# Patient Record
Sex: Male | Born: 1950 | Race: White | Hispanic: No | Marital: Married | Smoking: Never smoker
Health system: Southern US, Community
[De-identification: ages and names within clinical notes are randomized; demographics above are authoritative.]

## PROBLEM LIST (undated history)

## (undated) DIAGNOSIS — G8250 Quadriplegia, unspecified: Secondary | ICD-10-CM

## (undated) DIAGNOSIS — T7840XA Allergy, unspecified, initial encounter: Secondary | ICD-10-CM

## (undated) DIAGNOSIS — E119 Type 2 diabetes mellitus without complications: Secondary | ICD-10-CM

## (undated) DIAGNOSIS — G909 Disorder of the autonomic nervous system, unspecified: Secondary | ICD-10-CM

## (undated) DIAGNOSIS — I639 Cerebral infarction, unspecified: Secondary | ICD-10-CM

## (undated) DIAGNOSIS — R569 Unspecified convulsions: Secondary | ICD-10-CM

## (undated) DIAGNOSIS — G4700 Insomnia, unspecified: Secondary | ICD-10-CM

## (undated) DIAGNOSIS — G8190 Hemiplegia, unspecified affecting unspecified side: Secondary | ICD-10-CM

## (undated) DIAGNOSIS — G959 Disease of spinal cord, unspecified: Secondary | ICD-10-CM

## (undated) DIAGNOSIS — K219 Gastro-esophageal reflux disease without esophagitis: Secondary | ICD-10-CM

## (undated) DIAGNOSIS — I2510 Atherosclerotic heart disease of native coronary artery without angina pectoris: Secondary | ICD-10-CM

## (undated) DIAGNOSIS — E785 Hyperlipidemia, unspecified: Secondary | ICD-10-CM

## (undated) DIAGNOSIS — I6529 Occlusion and stenosis of unspecified carotid artery: Secondary | ICD-10-CM

## (undated) DIAGNOSIS — F419 Anxiety disorder, unspecified: Secondary | ICD-10-CM

## (undated) HISTORY — DX: Hyperlipidemia, unspecified: E78.5

## (undated) HISTORY — DX: Occlusion and stenosis of unspecified carotid artery: I65.29

## (undated) HISTORY — DX: Unspecified convulsions: R56.9

## (undated) HISTORY — DX: Anxiety disorder, unspecified: F41.9

## (undated) HISTORY — DX: Disease of spinal cord, unspecified: G95.9

## (undated) HISTORY — PX: TRACHEOSTOMY: SUR1362

## (undated) HISTORY — DX: Cerebral infarction, unspecified: I63.9

## (undated) HISTORY — DX: Type 2 diabetes mellitus without complications: E11.9

## (undated) HISTORY — DX: Hemiplegia, unspecified affecting unspecified side: G81.90

## (undated) HISTORY — DX: Gastro-esophageal reflux disease without esophagitis: K21.9

## (undated) HISTORY — PX: ENDARTERECTOMY: SHX5162

## (undated) HISTORY — DX: Insomnia, unspecified: G47.00

## (undated) HISTORY — DX: Allergy, unspecified, initial encounter: T78.40XA

## (undated) HISTORY — DX: Atherosclerotic heart disease of native coronary artery without angina pectoris: I25.10

## (undated) HISTORY — DX: Disorder of the autonomic nervous system, unspecified: G90.9

---

## 2005-02-09 ENCOUNTER — Ambulatory Visit: Payer: Self-pay | Admitting: Gastroenterology

## 2006-08-01 ENCOUNTER — Ambulatory Visit: Payer: Self-pay | Admitting: Family Medicine

## 2006-08-21 ENCOUNTER — Ambulatory Visit: Payer: Self-pay | Admitting: Vascular Surgery

## 2006-09-19 ENCOUNTER — Other Ambulatory Visit: Payer: Self-pay

## 2006-09-19 ENCOUNTER — Ambulatory Visit: Payer: Self-pay | Admitting: Vascular Surgery

## 2006-09-26 ENCOUNTER — Inpatient Hospital Stay: Payer: Self-pay | Admitting: Vascular Surgery

## 2006-10-04 ENCOUNTER — Encounter: Payer: Self-pay | Admitting: Vascular Surgery

## 2006-10-28 ENCOUNTER — Encounter: Payer: Self-pay | Admitting: Vascular Surgery

## 2006-11-18 ENCOUNTER — Other Ambulatory Visit: Payer: Self-pay

## 2006-11-18 ENCOUNTER — Emergency Department: Payer: Self-pay | Admitting: Emergency Medicine

## 2006-11-27 ENCOUNTER — Encounter: Payer: Self-pay | Admitting: Vascular Surgery

## 2006-12-28 ENCOUNTER — Encounter: Payer: Self-pay | Admitting: Vascular Surgery

## 2007-01-01 ENCOUNTER — Ambulatory Visit: Payer: Self-pay | Admitting: Gastroenterology

## 2007-01-27 ENCOUNTER — Encounter: Payer: Self-pay | Admitting: Vascular Surgery

## 2010-03-15 ENCOUNTER — Encounter: Payer: Self-pay | Admitting: Physical Medicine and Rehabilitation

## 2010-03-29 ENCOUNTER — Encounter: Payer: Self-pay | Admitting: Physical Medicine and Rehabilitation

## 2010-04-27 ENCOUNTER — Encounter: Payer: Self-pay | Admitting: Physical Medicine and Rehabilitation

## 2010-05-04 ENCOUNTER — Other Ambulatory Visit: Payer: Self-pay

## 2010-05-28 ENCOUNTER — Encounter: Payer: Self-pay | Admitting: Physical Medicine and Rehabilitation

## 2010-06-27 ENCOUNTER — Encounter: Payer: Self-pay | Admitting: Physical Medicine and Rehabilitation

## 2010-07-28 ENCOUNTER — Encounter: Payer: Self-pay | Admitting: Physical Medicine and Rehabilitation

## 2010-08-27 ENCOUNTER — Encounter: Payer: Self-pay | Admitting: Physical Medicine and Rehabilitation

## 2010-09-27 ENCOUNTER — Encounter: Payer: Self-pay | Admitting: Physical Medicine and Rehabilitation

## 2010-10-28 ENCOUNTER — Encounter: Payer: Self-pay | Admitting: Physical Medicine and Rehabilitation

## 2010-11-27 ENCOUNTER — Encounter: Payer: Self-pay | Admitting: Physical Medicine and Rehabilitation

## 2012-05-27 DIAGNOSIS — I6350 Cerebral infarction due to unspecified occlusion or stenosis of unspecified cerebral artery: Secondary | ICD-10-CM | POA: Diagnosis not present

## 2012-05-27 DIAGNOSIS — I6529 Occlusion and stenosis of unspecified carotid artery: Secondary | ICD-10-CM | POA: Diagnosis not present

## 2012-07-30 DIAGNOSIS — L219 Seborrheic dermatitis, unspecified: Secondary | ICD-10-CM | POA: Diagnosis not present

## 2012-07-30 DIAGNOSIS — L570 Actinic keratosis: Secondary | ICD-10-CM | POA: Diagnosis not present

## 2012-08-06 ENCOUNTER — Ambulatory Visit: Payer: Self-pay | Admitting: Family Medicine

## 2012-08-06 DIAGNOSIS — R42 Dizziness and giddiness: Secondary | ICD-10-CM | POA: Diagnosis not present

## 2012-08-06 DIAGNOSIS — M7989 Other specified soft tissue disorders: Secondary | ICD-10-CM | POA: Diagnosis not present

## 2012-08-06 DIAGNOSIS — R609 Edema, unspecified: Secondary | ICD-10-CM | POA: Diagnosis not present

## 2012-08-11 ENCOUNTER — Observation Stay: Payer: Self-pay | Admitting: Internal Medicine

## 2012-08-11 DIAGNOSIS — E785 Hyperlipidemia, unspecified: Secondary | ICD-10-CM | POA: Diagnosis not present

## 2012-08-11 DIAGNOSIS — Z7401 Bed confinement status: Secondary | ICD-10-CM | POA: Diagnosis not present

## 2012-08-11 DIAGNOSIS — G9340 Encephalopathy, unspecified: Secondary | ICD-10-CM | POA: Diagnosis not present

## 2012-08-11 DIAGNOSIS — Z7902 Long term (current) use of antithrombotics/antiplatelets: Secondary | ICD-10-CM | POA: Diagnosis not present

## 2012-08-11 DIAGNOSIS — Z79899 Other long term (current) drug therapy: Secondary | ICD-10-CM | POA: Diagnosis not present

## 2012-08-11 DIAGNOSIS — G8250 Quadriplegia, unspecified: Secondary | ICD-10-CM | POA: Diagnosis not present

## 2012-08-11 DIAGNOSIS — N390 Urinary tract infection, site not specified: Secondary | ICD-10-CM | POA: Diagnosis not present

## 2012-08-11 DIAGNOSIS — Z981 Arthrodesis status: Secondary | ICD-10-CM | POA: Diagnosis not present

## 2012-08-11 DIAGNOSIS — K219 Gastro-esophageal reflux disease without esophagitis: Secondary | ICD-10-CM | POA: Diagnosis not present

## 2012-08-11 DIAGNOSIS — M129 Arthropathy, unspecified: Secondary | ICD-10-CM | POA: Diagnosis not present

## 2012-08-11 DIAGNOSIS — T83511A Infection and inflammatory reaction due to indwelling urethral catheter, initial encounter: Secondary | ICD-10-CM | POA: Diagnosis not present

## 2012-08-11 DIAGNOSIS — R5381 Other malaise: Secondary | ICD-10-CM | POA: Diagnosis not present

## 2012-08-11 DIAGNOSIS — R4182 Altered mental status, unspecified: Secondary | ICD-10-CM | POA: Diagnosis not present

## 2012-08-11 DIAGNOSIS — IMO0002 Reserved for concepts with insufficient information to code with codable children: Secondary | ICD-10-CM | POA: Diagnosis not present

## 2012-08-11 DIAGNOSIS — G459 Transient cerebral ischemic attack, unspecified: Secondary | ICD-10-CM | POA: Diagnosis not present

## 2012-08-11 DIAGNOSIS — R5383 Other fatigue: Secondary | ICD-10-CM | POA: Diagnosis not present

## 2012-08-11 DIAGNOSIS — R404 Transient alteration of awareness: Secondary | ICD-10-CM | POA: Diagnosis not present

## 2012-08-11 DIAGNOSIS — R4701 Aphasia: Secondary | ICD-10-CM | POA: Diagnosis not present

## 2012-08-11 DIAGNOSIS — I10 Essential (primary) hypertension: Secondary | ICD-10-CM | POA: Diagnosis not present

## 2012-08-11 DIAGNOSIS — A498 Other bacterial infections of unspecified site: Secondary | ICD-10-CM | POA: Diagnosis not present

## 2012-08-11 LAB — CBC
HCT: 41.9 % (ref 40.0–52.0)
MCH: 30 pg (ref 26.0–34.0)
MCV: 90 fL (ref 80–100)
RBC: 4.67 10*6/uL (ref 4.40–5.90)
WBC: 11 10*3/uL — ABNORMAL HIGH (ref 3.8–10.6)

## 2012-08-11 LAB — COMPREHENSIVE METABOLIC PANEL
Albumin: 3.5 g/dL (ref 3.4–5.0)
Alkaline Phosphatase: 140 U/L — ABNORMAL HIGH (ref 50–136)
BUN: 13 mg/dL (ref 7–18)
Bilirubin,Total: 0.6 mg/dL (ref 0.2–1.0)
Calcium, Total: 9.3 mg/dL (ref 8.5–10.1)
Chloride: 107 mmol/L (ref 98–107)
EGFR (African American): 58 — ABNORMAL LOW
EGFR (Non-African Amer.): 50 — ABNORMAL LOW
Glucose: 83 mg/dL (ref 65–99)
Total Protein: 7.4 g/dL (ref 6.4–8.2)

## 2012-08-11 LAB — URINALYSIS, COMPLETE
Bilirubin,UR: NEGATIVE
Glucose,UR: NEGATIVE mg/dL (ref 0–75)
Hyaline Cast: 9
Ketone: NEGATIVE
RBC,UR: 3 /HPF (ref 0–5)
Specific Gravity: 1.009 (ref 1.003–1.030)
Squamous Epithelial: <1
WBC UR: 175 /HPF (ref 0–5)

## 2012-08-11 LAB — DRUG SCREEN, URINE
Amphetamines, Ur Screen: NEGATIVE (ref ?–1000)
MDMA (Ecstasy)Ur Screen: POSITIVE (ref ?–500)
Opiate, Ur Screen: NEGATIVE (ref ?–300)
Phencyclidine (PCP) Ur S: NEGATIVE (ref ?–25)
Tricyclic, Ur Screen: NEGATIVE (ref ?–1000)

## 2012-08-11 LAB — PROTIME-INR: INR: 1

## 2012-08-11 LAB — TROPONIN I: Troponin-I: <0.02 ng/mL

## 2012-08-13 LAB — BASIC METABOLIC PANEL
BUN: 11 mg/dL (ref 7–18)
Calcium, Total: 8.3 mg/dL — ABNORMAL LOW (ref 8.5–10.1)
Co2: 27 mmol/L (ref 21–32)
EGFR (African American): >60
Glucose: 103 mg/dL — ABNORMAL HIGH (ref 65–99)
Osmolality: 286 (ref 275–301)
Potassium: 3.6 mmol/L (ref 3.5–5.1)
Sodium: 144 mmol/L (ref 136–145)

## 2012-08-13 LAB — CLOSTRIDIUM DIFFICILE BY PCR

## 2012-08-22 DIAGNOSIS — A419 Sepsis, unspecified organism: Secondary | ICD-10-CM | POA: Diagnosis not present

## 2012-10-01 DIAGNOSIS — R9431 Abnormal electrocardiogram [ECG] [EKG]: Secondary | ICD-10-CM | POA: Diagnosis not present

## 2012-10-01 DIAGNOSIS — R569 Unspecified convulsions: Secondary | ICD-10-CM

## 2012-10-01 DIAGNOSIS — Z79899 Other long term (current) drug therapy: Secondary | ICD-10-CM | POA: Diagnosis not present

## 2012-10-01 DIAGNOSIS — R55 Syncope and collapse: Secondary | ICD-10-CM | POA: Diagnosis not present

## 2012-10-01 DIAGNOSIS — R259 Unspecified abnormal involuntary movements: Secondary | ICD-10-CM | POA: Diagnosis not present

## 2012-10-01 DIAGNOSIS — G8190 Hemiplegia, unspecified affecting unspecified side: Secondary | ICD-10-CM | POA: Insufficient documentation

## 2012-10-01 DIAGNOSIS — G8250 Quadriplegia, unspecified: Secondary | ICD-10-CM | POA: Diagnosis not present

## 2012-10-01 DIAGNOSIS — G909 Disorder of the autonomic nervous system, unspecified: Secondary | ICD-10-CM | POA: Insufficient documentation

## 2012-10-01 DIAGNOSIS — E785 Hyperlipidemia, unspecified: Secondary | ICD-10-CM

## 2012-10-01 DIAGNOSIS — A498 Other bacterial infections of unspecified site: Secondary | ICD-10-CM | POA: Diagnosis not present

## 2012-10-01 DIAGNOSIS — IMO0002 Reserved for concepts with insufficient information to code with codable children: Secondary | ICD-10-CM | POA: Diagnosis not present

## 2012-10-01 DIAGNOSIS — F3289 Other specified depressive episodes: Secondary | ICD-10-CM | POA: Diagnosis not present

## 2012-10-01 DIAGNOSIS — I6529 Occlusion and stenosis of unspecified carotid artery: Secondary | ICD-10-CM | POA: Diagnosis not present

## 2012-10-01 DIAGNOSIS — F329 Major depressive disorder, single episode, unspecified: Secondary | ICD-10-CM | POA: Diagnosis not present

## 2012-10-01 DIAGNOSIS — Z8673 Personal history of transient ischemic attack (TIA), and cerebral infarction without residual deficits: Secondary | ICD-10-CM | POA: Diagnosis not present

## 2012-10-01 DIAGNOSIS — H532 Diplopia: Secondary | ICD-10-CM | POA: Diagnosis not present

## 2012-10-01 DIAGNOSIS — N390 Urinary tract infection, site not specified: Secondary | ICD-10-CM | POA: Diagnosis not present

## 2012-10-01 DIAGNOSIS — Z993 Dependence on wheelchair: Secondary | ICD-10-CM | POA: Diagnosis not present

## 2012-10-01 DIAGNOSIS — F411 Generalized anxiety disorder: Secondary | ICD-10-CM | POA: Diagnosis not present

## 2012-10-01 HISTORY — DX: Hemiplegia, unspecified affecting unspecified side: G81.90

## 2012-10-01 HISTORY — DX: Disorder of the autonomic nervous system, unspecified: G90.9

## 2012-10-01 HISTORY — DX: Hyperlipidemia, unspecified: E78.5

## 2012-10-01 HISTORY — DX: Unspecified convulsions: R56.9

## 2012-10-02 DIAGNOSIS — N390 Urinary tract infection, site not specified: Secondary | ICD-10-CM | POA: Diagnosis not present

## 2012-10-02 DIAGNOSIS — R55 Syncope and collapse: Secondary | ICD-10-CM | POA: Diagnosis not present

## 2012-10-02 DIAGNOSIS — G8250 Quadriplegia, unspecified: Secondary | ICD-10-CM | POA: Diagnosis not present

## 2012-10-02 DIAGNOSIS — Z8673 Personal history of transient ischemic attack (TIA), and cerebral infarction without residual deficits: Secondary | ICD-10-CM | POA: Diagnosis not present

## 2012-10-02 DIAGNOSIS — R569 Unspecified convulsions: Secondary | ICD-10-CM | POA: Diagnosis not present

## 2012-10-02 DIAGNOSIS — G909 Disorder of the autonomic nervous system, unspecified: Secondary | ICD-10-CM | POA: Diagnosis not present

## 2013-01-13 DIAGNOSIS — K592 Neurogenic bowel, not elsewhere classified: Secondary | ICD-10-CM | POA: Diagnosis not present

## 2013-01-13 DIAGNOSIS — M62838 Other muscle spasm: Secondary | ICD-10-CM | POA: Diagnosis not present

## 2013-04-16 DIAGNOSIS — K219 Gastro-esophageal reflux disease without esophagitis: Secondary | ICD-10-CM | POA: Diagnosis not present

## 2013-04-16 DIAGNOSIS — G959 Disease of spinal cord, unspecified: Secondary | ICD-10-CM | POA: Diagnosis not present

## 2013-04-16 DIAGNOSIS — E785 Hyperlipidemia, unspecified: Secondary | ICD-10-CM | POA: Diagnosis not present

## 2013-04-16 DIAGNOSIS — F3289 Other specified depressive episodes: Secondary | ICD-10-CM | POA: Diagnosis not present

## 2013-04-16 DIAGNOSIS — F329 Major depressive disorder, single episode, unspecified: Secondary | ICD-10-CM | POA: Diagnosis not present

## 2013-09-10 DIAGNOSIS — N319 Neuromuscular dysfunction of bladder, unspecified: Secondary | ICD-10-CM | POA: Diagnosis not present

## 2013-09-10 DIAGNOSIS — Z0000 Encounter for general adult medical examination without abnormal findings: Secondary | ICD-10-CM | POA: Diagnosis not present

## 2013-09-10 DIAGNOSIS — K219 Gastro-esophageal reflux disease without esophagitis: Secondary | ICD-10-CM | POA: Diagnosis not present

## 2013-09-10 DIAGNOSIS — E785 Hyperlipidemia, unspecified: Secondary | ICD-10-CM | POA: Diagnosis not present

## 2013-09-15 DIAGNOSIS — I6350 Cerebral infarction due to unspecified occlusion or stenosis of unspecified cerebral artery: Secondary | ICD-10-CM | POA: Diagnosis not present

## 2013-09-15 DIAGNOSIS — I2510 Atherosclerotic heart disease of native coronary artery without angina pectoris: Secondary | ICD-10-CM | POA: Diagnosis not present

## 2013-09-15 DIAGNOSIS — I6529 Occlusion and stenosis of unspecified carotid artery: Secondary | ICD-10-CM | POA: Diagnosis not present

## 2014-05-24 DIAGNOSIS — J218 Acute bronchiolitis due to other specified organisms: Secondary | ICD-10-CM | POA: Diagnosis not present

## 2014-06-18 NOTE — H&P (Signed)
PATIENT NAME:  [name], [name] MR#:  [id number] DATE OF BIRTH:  February 26, 1951  DATE OF ADMISSION:  08/11/2012  PRIMARY CARE PHYSICIAN: Dr. Elizabeth Sauer  PRIMARY UROLOGIST:  Dr. Nino Parsley at [facility].   CHIEF COMPLAINT: Altered mental status. The patient is not himself.   History is obtained from patient, the patient's daughter and the patient's wife who are present in the Emergency Room. The patient is a 64 year old Caucasian gentleman with past medical history of spinal cord injury after a fall about three years ago that required extended stay at [facility] with spinal cord injury and rehabilitation along with tracheostomy and suprapubic catheter that was placed during that hospitalization. The patient has been taken care of at home. He is bedbound, needs assistance with all his activities of daily living including feeding. His wife, his daughter, help him out most of the time; however, he does have some help from outside home health agency.   The patient has been brought in today because he was not his usual self and per wife he was fixated on looking at something on the ceiling, which is not usual for him. He was talking less today and did not eat and has not drank any fluids this morning. There was no reported seizures or loss of consciousness noted. There is no reported fever either at home.   In the Emergency Room, the patient is hemodynamically stable. There is no fever documented. 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 The patient's urine came back positive for urinary tract infection. He is being admitted for further evaluation and management. He does have a mild elevated white count of 11,000. The patient received p.o. Cipro.    PAST MEDICAL HISTORY: 1. Spinal cord injury with spinal cord syndrome about three years ago requiring extended stay at [facility] with hospitalization that required tracheostomy and suprapubic catheter. The patient  underwent inpatient rehab, and then was sent home thereafter. He is being taken at home.  2. Chronic back pain.  3. Hypertension.  4. History of carpal tunnel release in the back. 5. Cervical fusion surgery in the past.   ALLERGIES: No known drug allergies.   SOCIAL HISTORY: Nonsmoker. Nonalcoholic lives at home with his wife.   MEDICATIONS: 1. Atorvastatin 10 mg daily.  2. Baclofen 20 mg 2 tablets twice a day.  3. Bupropion 75 mg b.i.d.  4. Plavix 75 mg daily.  5. Diazepam 5 mg 2 tablets at bedtime.  6. Oxybutynin 5 mg 2 tablets twice a day.  7. Protonix 40 mg p.o. daily.  8. Zyrtec 10 mg daily.   FAMILY HISTORY:  The patient, due to his mental status changes, cannot recall any family history at this time.   REVIEW OF SYSTEMS:  Limited due to mental status changes.   CONSTITUTIONAL: No fever. Positive for fatigue, weakness.   EYES: No blurred or double vision. No glaucoma or cataracts.   ENT: No tinnitus, ear pain, hearing loss.   RESPIRATORY: No cough, wheeze, hemoptysis, or shortness of breath.   CARDIOVASCULAR: No chest pain, edema or hypertension.   GASTROINTESTINAL: No nausea, vomiting, diarrhea, abdominal pain or GERD.  GENITOURINARY: No dysuria or hematuria. The patient has a suprapubic catheter.   ENDOCRINE: No polyuria, nocturia or thyroid problems.   HEMATOLOGY: No easy bruising.   SKIN: No acne or rash.   MUSCULOSKELETAL: Positive for arthritis and some muscle stiffness in both upper and lower extremity. The patient  does have some spasms.  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   PSYCHIATRIC: No anxiety or depression. All other systems reviewed and negative.   PHYSICAL EXAMINATION: GENERAL: The patient is awake. He answers a couple basic questions appropriately. He is afebrile, pulse 71, respirations 18, blood pressure is 153/70, sats are 97% on room air.   HEENT: Atraumatic, normocephalic. PERRLA. EOM intact. Oral mucosa is  mildly dry.   NECK: Supple. Oral tracheal tube is corroded.  RESPIRATORY: Clear to auscultation bilaterally. No rales, rhonchi, respiratory distress or labored breathing.   HEART: Both the heart sounds are normal. Rate, rhythm regular. PMI not lateralized. Chest is nontender.   EXTREMITIES: Good pedal pulses.   ABDOMEN: Soft. There is no mass felt. Suprapubic catheter present, site looks okay.   NEUROLOGIC: The patient has "quadriparesis" bilaterally, which is functional secondary to spinal cord injury. His reflexes are 1+ with a flicker.   The patient does have some spasticity in both lower extremities. Limited secondary to the patient's mental status changes.   SKIN: Warm and dry.   Urinalysis positive for urinary tract infection. Urine drug screen positive for MDMA and benzodiazepines.  CT of the head shows changes of sinusitis with old stroke left parietal lobe. Ammonia level is 29.  Cardiac enzymes are negative.  His comprehensive metabolic panel within normal limits except creatinine 1.46. CBC within normal limits except white count of 11. PT-INR is 13.7 in 1. Troponin is less than 0.02. Urinalysis positive for urinary tract infection. Positive for leukocyte esterase.   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 Altered mental status/generalized weakness suspected due to urinary tract infection. We will admit the patient to the medical floor. Regular diet. Continue IV fluids another 1000 mL for another 1 liter bag. We will give IV Cipro b.i.d., neuro checks q.4 hourly. The patient has a suprapubic catheter which is changed by family members every week. We will await urine cultures.  2. Spinal cord syndrome from injury status post fall about 5 years ago.  Bedbound, needs assistance with feeding and all his activities of daily living.  Will continue his routine baclofen for muscle  spasms. 3. Gastroesophageal reflux disease on Protonix.  4. Hyperlipidemia. On statins.  5. Further workup according to the patient's clinical course. Hospital admission plan was discussed with the patient's daughter and the patient's wife. The patient is a full code. I did leave a message [facility]rologist [facility] at [phone].   TIME SPENT: 50 minutes.    ____________________________ Wylie HailSona A. Allena KatzPatel, MD sap:rw D: 08/11/2012 17:01:15 ET T: 08/11/2012 17:32:43 ET JOB#: [id number]  cc: Alwaleed Obeso A. Allena KatzPatel, MD, <Dictator> Willow OraSONA A Kaylean Tupou MD ELECTRONICALLY SIGNED 08/20/2012 14:23

## 2014-06-18 NOTE — Discharge Summary (Signed)
PATIENT NAME:  [name], [name] MR#:  [id number] DATE OF BIRTH:  November 19, 1950  DATE OF ADMISSION:  08/11/2012 DATE OF DISCHARGE:  08/13/2012  ADMISSION DIAGNOSIS: Encephalopathy with altered mental status and urinary tract infection.   DISCHARGE DIAGNOSIS:  1. Encephalopathy with altered mental status and urinary tract infection.  2. Escherichia coli urinary tract infection.  3. C3-C4 paralysis   CONSULTS:  None.   Urine culture grew out greater than 100,000 gram-negative rods. I did call the lab and they that this was Escherichia coli. He also has 80,000 gram-negative rods and 40,000 gram-negative rods. C. difficile was negative.   HOSPITAL COURSE: [name] with C3-C4, paralysis and has a suprapubic catheter who presented with altered mental status. For further details, please refer to the H and P.   1. Encephalopathy with altered mental status from urinary tract infection caused by the suprapubic catheterization. The patient is growing out 3 gram-negative rods, 1 with greater than 100,000 colonies of Escherichia coli. The patient actually started on ciprofloxacin. He did quite well with this medication. His mental status is back to baseline almost immediately after giving antibiotics. He was afebrile. His white count was normal as well, so he will be discharged with ciprofloxacin.  2. Urinary tract infection related to his suprapubic catheter.  Escherichia coli with 2 other ones that were not greater than 100,000 colonies.  3. History C3-C4 paralysis. He will have home health care.   DISCHARGE MEDICATIONS: 1. Atorvastatin 10 mg at bedtime.  2. Plavix 75 mg daily.  3. Oxybutynin 5 mg 2 tablets b.i.d.   4. Bupropion 75 mg b.i.d.  5. Pantoprazole 40 mg daily.  6. Baclofen 20 mg 2 tablets b.i.d.  7. Diazepam 5 mg 2 tablets at bedtime.  8. Zyrtec 10 mg daily.  9. Ciprofloxacin 500 mg q. 12 hours for 8 days.   Discharge with home health, physical therapy, OT, and nurse.    DISCHARGE DIET: Low sodium.   DISCHARGE ACTIVITY: As tolerated.   DISCHARGE FOLLOW UP:  The patient will follow up with Dr. Elizabeth Sauereanna Jones in 1 week.   TIME SPENT:  35 minutes.  The patient is medically stable for discharge.   ____________________________ [name] Beagle P. Juliene PinaMody, [name] spm:rw D: 08/13/2012 14:33:07 ET T: 08/13/2012 20:17:08 ET JOB#: [id number]  cc: Camron Monday P. Juliene PinaMody, [name], <Dictator> Duanne Limerickeanna C. Jones, [name]  Janyth ContesSITAL P Tarica Harl [name] ELECTRONICALLY SIGNED 08/14/2012 13:09

## 2014-07-07 DIAGNOSIS — N319 Neuromuscular dysfunction of bladder, unspecified: Secondary | ICD-10-CM | POA: Diagnosis not present

## 2014-07-29 DIAGNOSIS — N319 Neuromuscular dysfunction of bladder, unspecified: Secondary | ICD-10-CM | POA: Diagnosis not present

## 2014-08-31 ENCOUNTER — Encounter: Payer: Self-pay | Admitting: Family Medicine

## 2014-08-31 ENCOUNTER — Ambulatory Visit (INDEPENDENT_AMBULATORY_CARE_PROVIDER_SITE_OTHER): Payer: 59 | Admitting: Family Medicine

## 2014-08-31 VITALS — BP 120/80 | HR 72 | Ht 72.0 in | Wt 185.0 lb

## 2014-08-31 DIAGNOSIS — Z0000 Encounter for general adult medical examination without abnormal findings: Secondary | ICD-10-CM

## 2014-08-31 DIAGNOSIS — N319 Neuromuscular dysfunction of bladder, unspecified: Secondary | ICD-10-CM

## 2014-08-31 DIAGNOSIS — K219 Gastro-esophageal reflux disease without esophagitis: Secondary | ICD-10-CM

## 2014-08-31 DIAGNOSIS — Z131 Encounter for screening for diabetes mellitus: Secondary | ICD-10-CM

## 2014-08-31 DIAGNOSIS — F32A Depression, unspecified: Secondary | ICD-10-CM

## 2014-08-31 DIAGNOSIS — F329 Major depressive disorder, single episode, unspecified: Secondary | ICD-10-CM

## 2014-08-31 DIAGNOSIS — E785 Hyperlipidemia, unspecified: Secondary | ICD-10-CM

## 2014-08-31 DIAGNOSIS — I2510 Atherosclerotic heart disease of native coronary artery without angina pectoris: Secondary | ICD-10-CM | POA: Diagnosis not present

## 2014-08-31 DIAGNOSIS — I6340 Cerebral infarction due to embolism of unspecified cerebral artery: Secondary | ICD-10-CM

## 2014-08-31 MED ORDER — OXYBUTYNIN CHLORIDE ER 5 MG PO TB24: 5.0000 mg | ORAL_TABLET | Freq: Three times a day (TID) | ORAL | Status: DC

## 2014-08-31 MED ORDER — ATORVASTATIN CALCIUM 10 MG PO TABS: 10.0000 mg | ORAL_TABLET | Freq: Every day | ORAL | Status: DC

## 2014-08-31 MED ORDER — PANTOPRAZOLE SODIUM 40 MG PO TBEC: 40.0000 mg | DELAYED_RELEASE_TABLET | Freq: Every day | ORAL | Status: DC

## 2014-08-31 MED ORDER — CLOPIDOGREL BISULFATE 75 MG PO TABS: 75.0000 mg | ORAL_TABLET | Freq: Every day | ORAL | Status: DC

## 2014-08-31 MED ORDER — BUPROPION HCL 75 MG PO TABS: 75.0000 mg | ORAL_TABLET | Freq: Two times a day (BID) | ORAL | Status: DC

## 2014-08-31 NOTE — Progress Notes (Signed)
Name: [name] ShaggyRichard C [name]   MRN: [id number]    DOB: 31-Dec-1950   Date:08/31/2014       Progress Note  Subjective  Chief Complaint  Chief Complaint  Patient presents with  . Annual Exam  . Hyperlipidemia  . Hypertension  . Gastrophageal Reflux    Hyperlipidemia This is a recurrent problem. The current episode started more than 1 year ago. Recent lipid tests were reviewed and are normal. He has no history of chronic renal disease, diabetes, hypothyroidism, liver disease, obesity or nephrotic syndrome. There are no known factors aggravating his hyperlipidemia. Associated symptoms include a focal weakness. Pertinent negatives include no chest pain, focal sensory loss, leg pain, myalgias or shortness of breath. Current antihyperlipidemic treatment includes statins. The current treatment provides moderate improvement of lipids. There are no compliance problems.  Risk factors for coronary artery disease include a sedentary lifestyle.  Gastrophageal Reflux He reports no abdominal pain, no belching, no chest pain, no choking, no coughing, no dysphagia, no heartburn, no nausea, no sore throat or no wheezing. This is a chronic problem. The current episode started more than 1 year ago. The problem occurs occasionally. The problem has been waxing and waning. The symptoms are aggravated by lying down. Pertinent negatives include no anemia, fatigue, melena, muscle weakness, orthopnea or weight loss. He has tried a PPI for the symptoms. The treatment provided mild relief.  Mental Health Problem The primary symptoms include dysphoric mood. The primary symptoms do not include delusions, hallucinations, bizarre behavior, disorganized speech, negative symptoms or somatic symptoms. The current episode started more than 1 month ago.  The onset of the illness is precipitated by a stressful event. Additional symptoms of the illness do not include no anhedonia, no insomnia, no hypersomnia, no fatigue, no headaches or no  abdominal pain. He does not admit to suicidal ideas. He does not have a plan to commit suicide. Risk factors that are present for mental illness include a history of mental illness and neurological disease.  Urinary Frequency  This is a chronic problem. The current episode started more than 1 year ago. The problem occurs every urination. The problem has been waxing and waning. Associated symptoms include frequency. Pertinent negatives include no chills, hematuria, nausea or urgency. The treatment provided mild relief. His past medical history is significant for catheterization and recurrent UTIs.  Neurologic Problem The patient's primary symptoms include focal weakness and weakness. The patient's pertinent negatives include no altered mental status or focal sensory loss. This is a chronic problem. The current episode started more than 1 year ago. The neurological problem developed suddenly. The problem is unchanged. There was right-sided and left-sided focality noted. Associated symptoms include bladder incontinence and bowel incontinence. Pertinent negatives include no abdominal pain, back pain, chest pain, dizziness, fatigue, fever, headaches, nausea, neck pain, palpitations or shortness of breath. Past treatments include neck support, medication and position change. The treatment provided mild relief. His past medical history is significant for a CVA. There is no history of a bleeding disorder or liver disease.    No problem-specific assessment & plan notes found for this encounter.   Past Medical History  Diagnosis Date  . GERD (gastroesophageal reflux disease)   . Anxiety   . CAD (coronary artery disease)   . Hyperlipidemia   . Allergy   . Stroke   . Spinal cord disease     Past Surgical History  Procedure Laterality Date  . Endarterectomy    . Endarterectomy    . Tracheostomy  No family history on file.  History   Social History  . Marital Status: Married    Spouse Name:  N/A  . Number of Children: N/A  . Years of Education: N/A   Occupational History  . Not on file.   Social History Main Topics  . Smoking status: Never Smoker   . Smokeless tobacco: Not on file  . Alcohol Use: No  . Drug Use: No  . Sexual Activity: Not on file   Other Topics Concern  . Not on file   Social History Narrative  . No narrative on file    Allergies not on file   Review of Systems  Constitutional: Negative for fever, chills, weight loss, malaise/fatigue and fatigue.  HENT: Negative for ear discharge, ear pain and sore throat.   Eyes: Negative for blurred vision.  Respiratory: Negative for cough, sputum production, choking, shortness of breath and wheezing.   Cardiovascular: Negative for chest pain, palpitations and leg swelling.  Gastrointestinal: Positive for bowel incontinence. Negative for heartburn, dysphagia, nausea, abdominal pain, diarrhea, constipation, blood in stool and melena.  Genitourinary: Positive for bladder incontinence and frequency. Negative for dysuria, urgency and hematuria.  Musculoskeletal: Negative for myalgias, back pain, joint pain, muscle weakness and neck pain.  Skin: Negative for rash.  Neurological: Positive for focal weakness and weakness. Negative for dizziness, tingling, sensory change and headaches.  Endo/Heme/Allergies: Negative for environmental allergies and polydipsia. Does not bruise/bleed easily.  Psychiatric/Behavioral: Positive for dysphoric mood. Negative for depression, suicidal ideas and hallucinations. The patient is not nervous/anxious and does not have insomnia.      Objective  Filed Vitals:   08/31/14 0852  BP: 120/80  Pulse: 72  Height: 6' (1.829 m)  Weight: 185 lb (83.915 kg)    Physical Exam  Constitutional: He is oriented to person, place, and time and well-developed, well-nourished, and in no distress.  HENT:  Head: Normocephalic.  Right Ear: External ear normal.  Left Ear: External ear normal.   Nose: Nose normal.  Mouth/Throat: Oropharynx is clear and moist.  Eyes: Conjunctivae and EOM are normal. Pupils are equal, round, and reactive to light. Right eye exhibits no discharge. Left eye exhibits no discharge. No scleral icterus.  Neck: Normal range of motion. Neck supple. No JVD present. No tracheal deviation present. No thyromegaly present.  Cardiovascular: Normal rate, regular rhythm, normal heart sounds and intact distal pulses.  Exam reveals no gallop and no friction rub.   No murmur heard. Pulmonary/Chest: Breath sounds normal. No respiratory distress. He has no wheezes. He has no rales.  Abdominal: Soft. Bowel sounds are normal. He exhibits no mass. There is no hepatosplenomegaly. There is no tenderness. There is no rebound, no guarding and no CVA tenderness.  Musculoskeletal: Normal range of motion. He exhibits no edema or tenderness.  Lymphadenopathy:    He has no cervical adenopathy.  Neurological: He is alert and oriented to person, place, and time. He has normal sensation, normal strength, normal reflexes and intact cranial nerves. No cranial nerve deficit.  Skin: Skin is warm. No rash noted.  Psychiatric: Mood and affect normal.      Assessment & Plan  Problem List Items Addressed This Visit      Other   Screening for diabetes mellitus    Other Visit Diagnoses    Annual physical exam    -  Primary    Coronary artery disease involving native coronary artery of native heart without angina pectoris  Relevant Medications    atorvastatin (LIPITOR) 10 MG tablet    Gastroesophageal reflux disease, esophagitis presence not specified        Relevant Medications    pantoprazole (PROTONIX) 40 MG tablet    Hyperlipemia        Relevant Medications    atorvastatin (LIPITOR) 10 MG tablet    Other Relevant Orders    Lipid Profile    Cerebral infarction due to embolism of cerebral artery        Relevant Medications    clopidogrel (PLAVIX) 75 MG tablet    Other  Relevant Orders    Renal Function Panel    Depression        Relevant Medications    buPROPion (WELLBUTRIN) 75 MG tablet    Neurogenic bladder        Relevant Medications    oxybutynin (DITROPAN-XL) 5 MG 24 hr tablet         Dr. [facility]  08/31/2014

## 2014-09-01 LAB — RENAL FUNCTION PANEL
Albumin: 4.3 g/dL (ref 3.6–4.8)
BUN/Creatinine Ratio: 23 — ABNORMAL HIGH (ref 10–22)
BUN: 21 mg/dL (ref 8–27)
CALCIUM: 9.6 mg/dL (ref 8.6–10.2)
CO2: 23 mmol/L (ref 18–29)
CREATININE: 0.9 mg/dL (ref 0.76–1.27)
Chloride: 101 mmol/L (ref 97–108)
GFR calc Af Amer: 103 mL/min/{1.73_m2} (ref >59)
GFR, EST NON AFRICAN AMERICAN: 89 mL/min/{1.73_m2} (ref >59)
GLUCOSE: 91 mg/dL (ref 65–99)
PHOSPHORUS: 3.2 mg/dL (ref 2.5–4.5)
Potassium: 5.2 mmol/L (ref 3.5–5.2)
Sodium: 139 mmol/L (ref 134–144)

## 2014-09-01 LAB — HGB A1C W/O EAG: Hgb A1c MFr Bld: 5.6 % (ref 4.8–5.6)

## 2014-09-01 LAB — LIPID PANEL
CHOLESTEROL TOTAL: 136 mg/dL (ref 100–199)
Chol/HDL Ratio: 2.2 ratio units (ref 0.0–5.0)
HDL: 62 mg/dL (ref >39)
LDL CALC: 60 mg/dL (ref 0–99)
TRIGLYCERIDES: 69 mg/dL (ref 0–149)
VLDL CHOLESTEROL CAL: 14 mg/dL (ref 5–40)

## 2014-09-03 LAB — NICOTINE/COTININE METABOLITES
COTININE: NOT DETECTED ng/mL
NICOTINE: NOT DETECTED ng/mL

## 2014-09-24 DIAGNOSIS — I6521 Occlusion and stenosis of right carotid artery: Secondary | ICD-10-CM | POA: Diagnosis not present

## 2014-09-24 DIAGNOSIS — I6529 Occlusion and stenosis of unspecified carotid artery: Secondary | ICD-10-CM | POA: Diagnosis not present

## 2014-09-26 ENCOUNTER — Other Ambulatory Visit: Payer: Self-pay | Admitting: Family Medicine

## 2014-09-26 DIAGNOSIS — N319 Neuromuscular dysfunction of bladder, unspecified: Secondary | ICD-10-CM

## 2014-10-05 ENCOUNTER — Other Ambulatory Visit: Payer: Self-pay | Admitting: Family Medicine

## 2014-10-05 DIAGNOSIS — K219 Gastro-esophageal reflux disease without esophagitis: Secondary | ICD-10-CM

## 2014-10-05 DIAGNOSIS — F32A Depression, unspecified: Secondary | ICD-10-CM

## 2014-10-05 DIAGNOSIS — F329 Major depressive disorder, single episode, unspecified: Secondary | ICD-10-CM

## 2014-10-05 DIAGNOSIS — E785 Hyperlipidemia, unspecified: Secondary | ICD-10-CM

## 2014-10-05 DIAGNOSIS — I2510 Atherosclerotic heart disease of native coronary artery without angina pectoris: Secondary | ICD-10-CM

## 2014-11-03 DIAGNOSIS — N390 Urinary tract infection, site not specified: Secondary | ICD-10-CM | POA: Diagnosis not present

## 2014-11-25 DIAGNOSIS — S14109S Unspecified injury at unspecified level of cervical spinal cord, sequela: Secondary | ICD-10-CM | POA: Diagnosis not present

## 2014-11-25 DIAGNOSIS — M62838 Other muscle spasm: Secondary | ICD-10-CM | POA: Diagnosis not present

## 2014-12-21 ENCOUNTER — Other Ambulatory Visit: Payer: Self-pay

## 2014-12-22 ENCOUNTER — Ambulatory Visit (INDEPENDENT_AMBULATORY_CARE_PROVIDER_SITE_OTHER): Payer: 59 | Admitting: Family Medicine

## 2014-12-22 ENCOUNTER — Encounter: Payer: Self-pay | Admitting: Family Medicine

## 2014-12-22 VITALS — BP 100/62 | HR 80

## 2014-12-22 DIAGNOSIS — I6340 Cerebral infarction due to embolism of unspecified cerebral artery: Secondary | ICD-10-CM

## 2014-12-22 DIAGNOSIS — R58 Hemorrhage, not elsewhere classified: Secondary | ICD-10-CM | POA: Diagnosis not present

## 2014-12-22 DIAGNOSIS — R609 Edema, unspecified: Secondary | ICD-10-CM

## 2014-12-22 DIAGNOSIS — Z23 Encounter for immunization: Secondary | ICD-10-CM | POA: Diagnosis not present

## 2014-12-22 MED ORDER — FUROSEMIDE 20 MG PO TABS: 20.0000 mg | ORAL_TABLET | Freq: Every day | ORAL | Status: DC

## 2014-12-22 NOTE — Progress Notes (Signed)
Name: [name]   MRN: [id number]    DOB: 08/20/1950   Date:12/22/2014       Progress Note  Subjective  Chief Complaint  Chief Complaint  Patient presents with  . Edema    swelling in ankles and arms- fluid/blood coming out when push on it    HPI Comments: Increased bruising  Congestive Heart Failure Presents for initial (concern increased generized edema) visit. Associated symptoms include edema. Pertinent negatives include no abdominal pain, chest pain, chest pressure, near-syncope, nocturia, orthopnea, palpitations, paroxysmal nocturnal dyspnea, shortness of breath or unexpected weight change. The symptoms have been worsening. His past medical history is significant for CVA. There is no history of arrhythmia, CAD, chronic lung disease, DVT, HTN, myocarditis, PE or valvular heart disease.    No problem-specific assessment & plan notes found for this encounter.   Past Medical History  Diagnosis Date  . GERD (gastroesophageal reflux disease)   . Anxiety   . CAD (coronary artery disease)   . Hyperlipidemia   . Allergy   . Stroke (HCC)   . Spinal cord disease [facility](HCC)     Past Surgical History  Procedure Laterality Date  . Endarterectomy    . Endarterectomy    . Tracheostomy      History reviewed. No pertinent family history.  Social History   Social History  . Marital Status: Married    Spouse Name: N/A  . Number of Children: N/A  . Years of Education: N/A   Occupational History  . Not on file.   Social History Main Topics  . Smoking status: Never Smoker   . Smokeless tobacco: Not on file  . Alcohol Use: No  . Drug Use: No  . Sexual Activity: Not on file   Other Topics Concern  . Not on file   Social History Narrative    No Known Allergies   Review of Systems  Constitutional: Negative for fever, chills, weight loss, malaise/fatigue and unexpected weight change.  HENT: Negative for ear discharge, ear pain and sore throat.   Eyes: Negative for  blurred vision.  Respiratory: Negative for cough, sputum production, shortness of breath and wheezing.   Cardiovascular: Positive for leg swelling. Negative for chest pain, palpitations and near-syncope.       Generalized swelling  Gastrointestinal: Negative for heartburn, nausea, abdominal pain, diarrhea, constipation, blood in stool and melena.  Genitourinary: Negative for dysuria, urgency, frequency, hematuria and nocturia.  Musculoskeletal: Negative for myalgias, back pain, joint pain and neck pain.  Skin: Negative for rash.  Neurological: Negative for dizziness, tingling, sensory change, focal weakness and headaches.  Endo/Heme/Allergies: Negative for environmental allergies and polydipsia. Bruises/bleeds easily.  Psychiatric/Behavioral: Negative for depression and suicidal ideas. The patient is not nervous/anxious and does not have insomnia.      Objective  Filed Vitals:   12/22/14 0936  BP: 100/62  Pulse: 80    Physical Exam  Constitutional: He is oriented to person, place, and time and well-developed, well-nourished, and in no distress.  HENT:  Head: Normocephalic.  Right Ear: External ear normal.  Left Ear: External ear normal.  Nose: Nose normal.  Mouth/Throat: Oropharynx is clear and moist.  Eyes: Conjunctivae and EOM are normal. Pupils are equal, round, and reactive to light. Right eye exhibits no discharge. Left eye exhibits no discharge. No scleral icterus.  Neck: Normal range of motion. Neck supple. No JVD present. No tracheal deviation present. No thyromegaly present.  Cardiovascular: Normal rate, regular rhythm, S1 normal, S2 normal,  normal heart sounds, intact distal pulses and normal pulses.  Exam reveals no gallop and no friction rub.   No murmur heard. Pulmonary/Chest: Breath sounds normal. No respiratory distress. He has no wheezes. He has no rales.  Abdominal: Soft. Bowel sounds are normal. He exhibits no mass. There is no hepatosplenomegaly. There is no  tenderness. There is no rebound, no guarding and no CVA tenderness.  Musculoskeletal: Normal range of motion. He exhibits no edema or tenderness.  Lymphadenopathy:    He has no cervical adenopathy.  Neurological: He is alert and oriented to person, place, and time. He has normal sensation, normal strength and intact cranial nerves.  Skin: Skin is warm. No rash noted.  Psychiatric: Mood and affect normal.  Nursing note and vitals reviewed.     Assessment & Plan  Problem List Items Addressed This Visit    None    Visit Diagnoses    Edema, unspecified type    -  Primary    Relevant Medications    furosemide (LASIX) 20 MG tablet    Other Relevant Orders    Renal Function Panel    Albumin    Ecchymosis        Relevant Orders    CBC w/Diff/Platelet    PT AND PTT    Hepatic function panel    Need for influenza vaccination        Relevant Orders    Flu Vaccine QUAD 36+ mos PF IM (Fluarix & Fluzone Quad PF) (Completed)    Need for pneumococcal vaccination        Relevant Orders    Pneumococcal polysaccharide vaccine 23-valent greater than or equal to 2yo subcutaneous/IM (Completed)         Dr. [facility]  12/22/2014

## 2014-12-23 ENCOUNTER — Other Ambulatory Visit: Payer: Self-pay | Admitting: Family Medicine

## 2014-12-23 LAB — HEPATIC FUNCTION PANEL
ALBUMIN: 3.6 g/dL (ref 3.6–4.8)
ALT: 18 IU/L (ref 0–44)
AST: 16 IU/L (ref 0–40)
Alkaline Phosphatase: 121 IU/L — ABNORMAL HIGH (ref 39–117)
BILIRUBIN TOTAL: 0.5 mg/dL (ref 0.0–1.2)
BILIRUBIN, DIRECT: 0.21 mg/dL (ref 0.00–0.40)
Total Protein: 6.5 g/dL (ref 6.0–8.5)

## 2014-12-23 LAB — CBC WITH DIFFERENTIAL/PLATELET
BASOS ABS: 0 10*3/uL (ref 0.0–0.2)
Basos: 0 %
EOS (ABSOLUTE): 0.2 10*3/uL (ref 0.0–0.4)
Eos: 2 %
Hematocrit: 36.9 % — ABNORMAL LOW (ref 37.5–51.0)
Hemoglobin: 12.1 g/dL — ABNORMAL LOW (ref 12.6–17.7)
IMMATURE GRANS (ABS): 0 10*3/uL (ref 0.0–0.1)
Immature Granulocytes: 0 %
LYMPHS: 19 %
Lymphocytes Absolute: 1.9 10*3/uL (ref 0.7–3.1)
MCH: 30.3 pg (ref 26.6–33.0)
MCHC: 32.8 g/dL (ref 31.5–35.7)
MCV: 93 fL (ref 79–97)
MONOCYTES: 5 %
MONOS ABS: 0.5 10*3/uL (ref 0.1–0.9)
NEUTROS PCT: 74 %
Neutrophils Absolute: 7.1 10*3/uL — ABNORMAL HIGH (ref 1.4–7.0)
PLATELETS: 296 10*3/uL (ref 150–379)
RBC: 3.99 x10E6/uL — ABNORMAL LOW (ref 4.14–5.80)
RDW: 14.7 % (ref 12.3–15.4)
WBC: 9.8 10*3/uL (ref 3.4–10.8)

## 2014-12-23 LAB — PT AND PTT
INR: 1.1 (ref 0.8–1.2)
PROTHROMBIN TIME: 11.1 s (ref 9.1–12.0)
aPTT: 28 s (ref 24–33)

## 2014-12-23 LAB — RENAL FUNCTION PANEL
Albumin: 3.5 g/dL — ABNORMAL LOW (ref 3.6–4.8)
BUN / CREAT RATIO: 16 (ref 10–22)
BUN: 15 mg/dL (ref 8–27)
CO2: 22 mmol/L (ref 18–29)
CREATININE: 0.96 mg/dL (ref 0.76–1.27)
Calcium: 8.8 mg/dL (ref 8.6–10.2)
Chloride: 97 mmol/L (ref 97–106)
GFR, EST AFRICAN AMERICAN: 95 mL/min/{1.73_m2} (ref >59)
GFR, EST NON AFRICAN AMERICAN: 83 mL/min/{1.73_m2} (ref >59)
GLUCOSE: 237 mg/dL — AB (ref 65–99)
POTASSIUM: 4.3 mmol/L (ref 3.5–5.2)
Phosphorus: 2.5 mg/dL (ref 2.5–4.5)
SODIUM: 135 mmol/L — AB (ref 136–144)

## 2014-12-25 ENCOUNTER — Other Ambulatory Visit: Payer: Self-pay | Admitting: Family Medicine

## 2015-02-23 ENCOUNTER — Encounter: Payer: Self-pay | Admitting: Family Medicine

## 2015-02-23 ENCOUNTER — Ambulatory Visit (INDEPENDENT_AMBULATORY_CARE_PROVIDER_SITE_OTHER): Payer: 59 | Admitting: Family Medicine

## 2015-02-23 VITALS — BP 140/80 | HR 88 | Ht 72.0 in

## 2015-02-23 DIAGNOSIS — I6340 Cerebral infarction due to embolism of unspecified cerebral artery: Secondary | ICD-10-CM

## 2015-02-23 DIAGNOSIS — F419 Anxiety disorder, unspecified: Secondary | ICD-10-CM

## 2015-02-23 DIAGNOSIS — F4321 Adjustment disorder with depressed mood: Secondary | ICD-10-CM

## 2015-02-23 DIAGNOSIS — R739 Hyperglycemia, unspecified: Secondary | ICD-10-CM | POA: Diagnosis not present

## 2015-02-23 DIAGNOSIS — G4700 Insomnia, unspecified: Secondary | ICD-10-CM

## 2015-02-23 HISTORY — DX: Insomnia, unspecified: G47.00

## 2015-02-23 MED ORDER — ALPRAZOLAM 0.25 MG PO TABS: 0.2500 mg | ORAL_TABLET | Freq: Every day | ORAL | Status: DC

## 2015-02-23 NOTE — Progress Notes (Signed)
Name: [name]   MRN: [id number]    DOB: March 22, 1950   Date:02/23/2015       Progress Note  Subjective  Chief Complaint  Chief Complaint  Patient presents with  . Insomnia    having trouble sleeping due to "home issues"    Insomnia Primary symptoms: no fragmented sleep, no sleep disturbance, difficulty falling asleep, no somnolence, no frequent awakening, no malaise/fatigue, no napping.  The current episode started more than one month. The onset quality is gradual. The problem occurs intermittently. The problem has been waxing and waning since onset. The symptoms are aggravated by family issues. Past treatments include nothing. PMH includes: depression, family stress or anxiety.    No problem-specific assessment & plan notes found for this encounter.   Past Medical History  Diagnosis Date  . GERD (gastroesophageal reflux disease)   . Anxiety   . CAD (coronary artery disease)   . Hyperlipidemia   . Allergy   . Stroke (HCC)   . Spinal cord disease [facility])     Past Surgical History  Procedure Laterality Date  . Endarterectomy    . Endarterectomy    . Tracheostomy      History reviewed. No pertinent family history.  Social History   Social History  . Marital Status: Married    Spouse Name: N/A  . Number of Children: N/A  . Years of Education: N/A   Occupational History  . Not on file.   Social History Main Topics  . Smoking status: Never Smoker   . Smokeless tobacco: Not on file  . Alcohol Use: No  . Drug Use: No  . Sexual Activity: Not on file   Other Topics Concern  . Not on file   Social History Narrative    No Known Allergies   Review of Systems  Constitutional: Negative for fever, chills, weight loss and malaise/fatigue.  HENT: Negative for ear discharge, ear pain and sore throat.   Eyes: Negative for blurred vision.  Respiratory: Negative for cough, sputum production, shortness of breath and wheezing.   Cardiovascular: Negative for chest  pain, palpitations and leg swelling.  Gastrointestinal: Negative for heartburn, nausea, abdominal pain, diarrhea, constipation, blood in stool and melena.  Genitourinary: Negative for dysuria, urgency, frequency and hematuria.  Musculoskeletal: Negative for myalgias, back pain, joint pain and neck pain.  Skin: Negative for rash.  Neurological: Negative for dizziness, tingling, sensory change, focal weakness and headaches.  Endo/Heme/Allergies: Negative for environmental allergies and polydipsia. Does not bruise/bleed easily.  Psychiatric/Behavioral: Positive for depression. Negative for suicidal ideas and sleep disturbance. The patient has insomnia. The patient is not nervous/anxious.      Objective  Filed Vitals:   02/23/15 0903  BP: 140/80  Pulse: 88  Height: 6' (1.829 m)    Physical Exam  Constitutional: He is oriented to person, place, and time and well-developed, well-nourished, and in no distress.  HENT:  Head: Normocephalic.  Right Ear: External ear normal.  Left Ear: External ear normal.  Nose: Nose normal.  Mouth/Throat: Oropharynx is clear and moist.  Eyes: Conjunctivae and EOM are normal. Pupils are equal, round, and reactive to light. Right eye exhibits no discharge. Left eye exhibits no discharge. No scleral icterus.  Neck: Normal range of motion. Neck supple. No JVD present. No tracheal deviation present. No thyromegaly present.  Cardiovascular: Normal rate, regular rhythm, normal heart sounds and intact distal pulses.  Exam reveals no gallop and no friction rub.   No murmur heard. Pulmonary/Chest: Breath  sounds normal. No respiratory distress. He has no wheezes. He has no rales.  Abdominal: Soft. Bowel sounds are normal. He exhibits no mass. There is no hepatosplenomegaly. There is no tenderness. There is no rebound, no guarding and no CVA tenderness.  Musculoskeletal: Normal range of motion. He exhibits no edema or tenderness.  Lymphadenopathy:    He has no  cervical adenopathy.  Neurological: He is alert and oriented to person, place, and time. He has normal sensation, normal strength and intact cranial nerves. No cranial nerve deficit.  Skin: Skin is warm. No rash noted.  Psychiatric: Mood and affect normal.      Assessment & Plan  Problem List Items Addressed This Visit      Other   Adjustment disorder with depressed mood   Relevant Medications   ALPRAZolam (XANAX) 0.25 MG tablet   Insomnia - Primary   Relevant Medications   ALPRAZolam (XANAX) 0.25 MG tablet    Other Visit Diagnoses    Acute anxiety        Relevant Medications    ALPRAZolam (XANAX) 0.25 MG tablet    Hyperglycemia        Relevant Orders    HgB A1c         Dr. Hayden Rasmusseneanna Jones [facility]  02/23/2015

## 2015-02-23 NOTE — Patient Instructions (Signed)
Insomnia Insomnia is a sleep disorder that makes it difficult to fall asleep or to stay asleep. Insomnia can cause tiredness (fatigue), low energy, difficulty concentrating, mood swings, and poor performance at work or school.  There are three different ways to classify insomnia:  Difficulty falling asleep.  Difficulty staying asleep.  Waking up too early in the morning. Any type of insomnia can be long-term (chronic) or short-term (acute). Both are common. Short-term insomnia usually lasts for three months or less. Chronic insomnia occurs at least three times a week for longer than three months. CAUSES  Insomnia may be caused by another condition, situation, or substance, such as:  Anxiety.  Certain medicines.  Gastroesophageal reflux disease (GERD) or other gastrointestinal conditions.  Asthma or other breathing conditions.  Restless legs syndrome, sleep apnea, or other sleep disorders.  Chronic pain.  Menopause. This may include hot flashes.  Stroke.  Abuse of alcohol, tobacco, or illegal drugs.  Depression.  Caffeine.   Neurological disorders, such as Alzheimer disease.  An overactive thyroid (hyperthyroidism). The cause of insomnia may not be known. RISK FACTORS Risk factors for insomnia include:  Gender. Women are more commonly affected than men.  Age. Insomnia is more common as you get older.  Stress. This may involve your professional or personal life.  Income. Insomnia is more common in people with lower income.  Lack of exercise.   Irregular work schedule or night shifts.  Traveling between different time zones. SIGNS AND SYMPTOMS If you have insomnia, trouble falling asleep or trouble staying asleep is the main symptom. This may lead to other symptoms, such as:  Feeling fatigued.  Feeling nervous about going to sleep.  Not feeling rested in the morning.  Having trouble concentrating.  Feeling irritable, anxious, or depressed. TREATMENT   Treatment for insomnia depends on the cause. If your insomnia is caused by an underlying condition, treatment will focus on addressing the condition. Treatment may also include:   Medicines to help you sleep.  Counseling or therapy.  Lifestyle adjustments. HOME CARE INSTRUCTIONS   Take medicines only as directed by your health care provider.  Keep regular sleeping and waking hours. Avoid naps.  Keep a sleep diary to help you and your health care provider figure out what could be causing your insomnia. Include:   When you sleep.  When you wake up during the night.  How well you sleep.   How rested you feel the next day.  Any side effects of medicines you are taking.  What you eat and drink.   Make your bedroom a comfortable place where it is easy to fall asleep:  Put up shades or special blackout curtains to block light from outside.  Use a white noise machine to block noise.  Keep the temperature cool.   Exercise regularly as directed by your health care provider. Avoid exercising right before bedtime.  Use relaxation techniques to manage stress. Ask your health care provider to suggest some techniques that may work well for you. These may include:  Breathing exercises.  Routines to release muscle tension.  Visualizing peaceful scenes.  Cut back on alcohol, caffeinated beverages, and cigarettes, especially close to bedtime. These can disrupt your sleep.  Do not overeat or eat spicy foods right before bedtime. This can lead to digestive discomfort that can make it hard for you to sleep.  Limit screen use before bedtime. This includes:  Watching TV.  Using your smartphone, tablet, and computer.  Stick to a routine. This   can help you fall asleep faster. Try to do a quiet activity, brush your teeth, and go to bed at the same time each night.  Get out of bed if you are still awake after 15 minutes of trying to sleep. Keep the lights down, but try reading or  doing a quiet activity. When you feel sleepy, go back to bed.  Make sure that you drive carefully. Avoid driving if you feel very sleepy.  Keep all follow-up appointments as directed by your health care provider. This is important. SEEK MEDICAL CARE IF:   You are tired throughout the day or have trouble in your daily routine due to sleepiness.  You continue to have sleep problems or your sleep problems get worse. SEEK IMMEDIATE MEDICAL CARE IF:   You have serious thoughts about hurting yourself or someone else.   This information is not intended to replace advice given to you by your health care provider. Make sure you discuss any questions you have with your health care provider.   Document Released: 02/10/2000 Document Revised: 11/03/2014 Document Reviewed: 11/13/2013 Elsevier Interactive Patient Education 2016 [employer]  

## 2015-02-24 LAB — HEMOGLOBIN A1C
Est. average glucose Bld gHb Est-mCnc: 137 mg/dL
HEMOGLOBIN A1C: 6.4 % — AB (ref 4.8–5.6)

## 2015-03-02 MED ORDER — METFORMIN HCL 500 MG PO TABS: 500.0000 mg | ORAL_TABLET | Freq: Two times a day (BID) | ORAL | Status: DC

## 2015-03-02 NOTE — Addendum Note (Signed)
Addended by: Everitt AmberLYNCH, Ewel Lona L on: 03/02/2015 03:33 PM   Modules accepted: Orders

## 2015-03-23 ENCOUNTER — Other Ambulatory Visit: Payer: Self-pay | Admitting: Family Medicine

## 2015-03-25 ENCOUNTER — Other Ambulatory Visit: Payer: Self-pay | Admitting: Family Medicine

## 2015-04-11 ENCOUNTER — Other Ambulatory Visit: Payer: Self-pay

## 2015-04-20 ENCOUNTER — Other Ambulatory Visit: Payer: Self-pay | Admitting: Family Medicine

## 2015-04-28 ENCOUNTER — Other Ambulatory Visit: Payer: Self-pay

## 2015-05-06 ENCOUNTER — Other Ambulatory Visit: Payer: Self-pay

## 2015-05-06 MED ORDER — FUROSEMIDE 20 MG PO TABS: 20.0000 mg | ORAL_TABLET | Freq: Every day | ORAL | Status: DC

## 2015-05-12 ENCOUNTER — Emergency Department: Payer: 59

## 2015-05-12 ENCOUNTER — Observation Stay
Admission: EM | Admit: 2015-05-12 | Discharge: 2015-05-13 | DRG: 100 | Disposition: A | Discharge status: Home / Self Care | Payer: 59 | Attending: Internal Medicine | Admitting: Internal Medicine

## 2015-05-12 DIAGNOSIS — R569 Unspecified convulsions: Secondary | ICD-10-CM

## 2015-05-12 DIAGNOSIS — I2510 Atherosclerotic heart disease of native coronary artery without angina pectoris: Secondary | ICD-10-CM | POA: Diagnosis present

## 2015-05-12 DIAGNOSIS — R4182 Altered mental status, unspecified: Secondary | ICD-10-CM | POA: Diagnosis not present

## 2015-05-12 DIAGNOSIS — N390 Urinary tract infection, site not specified: Secondary | ICD-10-CM | POA: Diagnosis present

## 2015-05-12 DIAGNOSIS — G8250 Quadriplegia, unspecified: Secondary | ICD-10-CM | POA: Diagnosis present

## 2015-05-12 DIAGNOSIS — F419 Anxiety disorder, unspecified: Secondary | ICD-10-CM | POA: Diagnosis not present

## 2015-05-12 DIAGNOSIS — E785 Hyperlipidemia, unspecified: Secondary | ICD-10-CM | POA: Diagnosis not present

## 2015-05-12 DIAGNOSIS — K219 Gastro-esophageal reflux disease without esophagitis: Secondary | ICD-10-CM | POA: Diagnosis not present

## 2015-05-12 DIAGNOSIS — G40409 Other generalized epilepsy and epileptic syndromes, not intractable, without status epilepticus: Secondary | ICD-10-CM | POA: Diagnosis not present

## 2015-05-12 DIAGNOSIS — G9389 Other specified disorders of brain: Secondary | ICD-10-CM | POA: Diagnosis present

## 2015-05-12 DIAGNOSIS — Z79899 Other long term (current) drug therapy: Secondary | ICD-10-CM | POA: Diagnosis not present

## 2015-05-12 DIAGNOSIS — Z7984 Long term (current) use of oral hypoglycemic drugs: Secondary | ICD-10-CM

## 2015-05-12 DIAGNOSIS — Z7902 Long term (current) use of antithrombotics/antiplatelets: Secondary | ICD-10-CM

## 2015-05-12 DIAGNOSIS — Z8673 Personal history of transient ischemic attack (TIA), and cerebral infarction without residual deficits: Secondary | ICD-10-CM

## 2015-05-12 DIAGNOSIS — G40909 Epilepsy, unspecified, not intractable, without status epilepticus: Secondary | ICD-10-CM | POA: Diagnosis not present

## 2015-05-12 HISTORY — DX: Quadriplegia, unspecified: G82.50

## 2015-05-12 LAB — BASIC METABOLIC PANEL
ANION GAP: 9 (ref 5–15)
BUN: 20 mg/dL (ref 6–20)
CALCIUM: 8.9 mg/dL (ref 8.9–10.3)
CO2: 26 mmol/L (ref 22–32)
Chloride: 101 mmol/L (ref 101–111)
Creatinine, Ser: 1.04 mg/dL (ref 0.61–1.24)
GLUCOSE: 159 mg/dL — AB (ref 65–99)
POTASSIUM: 4.3 mmol/L (ref 3.5–5.1)
SODIUM: 136 mmol/L (ref 135–145)

## 2015-05-12 LAB — CBC
HCT: 36.4 % — ABNORMAL LOW (ref 40.0–52.0)
HEMOGLOBIN: 12 g/dL — AB (ref 13.0–18.0)
MCH: 30.4 pg (ref 26.0–34.0)
MCHC: 33 g/dL (ref 32.0–36.0)
MCV: 92.1 fL (ref 80.0–100.0)
PLATELETS: 173 10*3/uL (ref 150–440)
RBC: 3.95 MIL/uL — ABNORMAL LOW (ref 4.40–5.90)
RDW: 17 % — AB (ref 11.5–14.5)
WBC: 5.6 10*3/uL (ref 3.8–10.6)

## 2015-05-12 NOTE — ED Provider Notes (Signed)
[facility] Emergency Department Provider Note  ____________________________________________  Time seen: Approximately 11:25 PM  I have reviewed the triage vital signs and the nursing notes.   HISTORY  Chief Complaint Seizures    HPI [name] is a 65 y.o. 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 He also has had a stroke in the past and had a 70% blockage in one of the carotids. She has episodes where he turns gray and passes out. These have been thought perhaps to be seizures in the past. Patient was seen at [facility] and had the EEG and CT of the head in the past but was never put on any medicines. Today he had an episode where he got stiff and shook all over from the mouth. He rolled on his side and he vomited. He was unconscious or confused for approximately an hour and hour and a half afterwards. He is now back to his baseline. Family reports he usually falls asleep very quickly and when he does he desats down into the 80s. He wakes up and his sats come back this happened and while I was in the room. Family reports is normal. Patient's blood pressure 108/54 his proximal normal for him and his temperature usually runs 95. His family. Patient has never had this generalized stiffening and shaking before which sounds like a new seizure. Seizure self did not last very long but as I said the postictal phase was approximately an hour to an hour and half.  Past Medical History  Diagnosis Date  . GERD (gastroesophageal reflux disease)   . Anxiety   . CAD (coronary artery disease)   . Hyperlipidemia   . Allergy   . Stroke (HCC)   . Spinal cord disease (HCC)   . Quadriplegia [facility])     Patient Active Problem List   Diagnosis Date Noted  . GERD (gastroesophageal reflux disease) 05/13/2015  . CAD (coronary artery disease) 05/13/2015  . Anxiety 05/13/2015  . Quadriplegia (HCC) 05/13/2015  . UTI (lower urinary tract infection) 05/13/2015  . Adjustment  disorder with depressed mood 02/23/2015  . Insomnia 02/23/2015  . Screening for diabetes mellitus 08/31/2014  . 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 Hemiplegia (HCC) 10/01/2012  . HLD (hyperlipidemia) 10/01/2012  . Seizure (HCC) 10/01/2012  . Concussion and swelling of spinal cord 10/01/2012    Past Surgical History  Procedure Laterality Date  . Endarterectomy    . Endarterectomy    . Tracheostomy      No current outpatient prescriptions on file.  Allergies Review of patient's allergies indicates no known allergies.  Family History  Problem Relation Age of Onset  . Stroke Father     Social History Social History  Substance Use Topics  . Smoking status: Never Smoker   . Smokeless tobacco: None  . Alcohol Use: No    Review of Systems Constitutional: No fever/chills Eyes: No visual changes. ENT: No sore throat. Cardiovascular: Denies chest pain. Respiratory: Denies shortness of breath. Gastrointestinal: No abdominal pain.  No nausea, vomiting.  No diarrhea.  No constipation. Genitourinary: Negative for dysuria. Musculoskeletal: Negative for back pain. Skin: Negative for rash. Neurological: Negative for headaches,  10-point ROS otherwise negative.  ____________________________________________   PHYSICAL EXAM:  VITAL SIGNS: ED Triage Vitals  Enc Vitals Group     BP 05/12/15 2301 108/54 mmHg     Pulse Rate 05/12/15 2301 57     Resp 05/12/15 2301 18  Temp 05/12/15 2301 95.4 F (35.2 C)     Temp Source 05/12/15 2301 Rectal     SpO2 05/12/15 2301 96 %     Weight --      Height --      Head Cir --      Peak Flow --      Pain Score --      Pain Loc --      Pain Edu? --      Excl. in GC? --     Constitutional: Alert and oriented. Well appearing and in no acute distress. Eyes: Conjunctivae are normal. PERRL. EOMI. Head: Atraumatic. Nose: No congestion/rhinnorhea. Mouth/Throat: Mucous membranes are moist.  Oropharynx  non-erythematous. Neck: No stridor.  Cardiovascular: Normal rate, regular rhythm. Grossly normal heart sounds.  Good peripheral circulation. Respiratory: .  No retractions. Lungs CTAB. Gastrointestinal: Soft and nontender. No distention. No abdominal bruits. No CVA tenderness. Musculoskeletal: No lower extremity tenderness nor edema.  No joint effusions. Neurologic:  Normal speech and language. No new gross focal neurologic deficits are appreciated. Patient is quadriplegic     ____________________________________________   LABS (all labs ordered are listed, but only abnormal results are displayed)  Labs Reviewed  BASIC METABOLIC PANEL - Abnormal; Notable for the following:    Glucose, Bld 159 (*)    All other components within normal limits  CBC - Abnormal; Notable for the following:    RBC 3.95 (*)    Hemoglobin 12.0 (*)    HCT 36.4 (*)    RDW 17.0 (*)    All other components within normal limits  URINALYSIS COMPLETEWITH MICROSCOPIC ([facility] ONLY) - Abnormal; Notable for the following:    Color, Urine YELLOW (*)    APPearance HAZY (*)    Hgb urine dipstick 1+ (*)    Leukocytes, UA 3+ (*)    Bacteria, UA MANY (*)    Squamous Epithelial / LPF 0-5 (*)    All other components within normal limits  CBC - Abnormal; Notable for the following:    WBC 13.6 (*)    RBC 4.03 (*)    Hemoglobin 12.2 (*)    HCT 36.7 (*)    RDW 16.9 (*)    All other components within normal limits  MAGNESIUM - Abnormal; Notable for the following:    Magnesium 1.5 (*)    All other components within normal limits  GLUCOSE, CAPILLARY - Abnormal; Notable for the following:    Glucose-Capillary 157 (*)    All other components within normal limits  MRSA PCR SCREENING  URINE CULTURE  URINE CULTURE  LACTIC ACID, PLASMA  LACTIC ACID, PLASMA  CREATININE, SERUM  TROPONIN I  HEPATIC FUNCTION PANEL   ____________________________________________  EKG  EKG read and interpreted by me shows normal sinus  rhythm rate of 60 normal axis no acute changes ____________________________________________  RADIOLOGY CT per radiology IMPRESSION: 1. No acute intracranial process. 2. Remote left MCA territory infarct. 3. Mild atrophy with chronic small vessel ischemic disease. 4. Chronic left frontal sinusitis.   ____________________________________________   PROCEDURES  Discussed with Mrs. Seavey recommended loading with 1000 mg of Keppra and Keppra 500 twice a day recommended an MRI and EEG and 24 hours. This is a good idea especially in view of the patient's episodes of hypotension bradycardia. ____________________________________________   INITIAL IMPRESSION / ASSESSMENT AND PLAN / ED COURSE  Pertinent labs & imaging results that were available during my care of the patient were reviewed by me and considered in  my medical decision making (see chart for details).   ____________________________________________   FINAL CLINICAL IMPRESSION(S) / ED DIAGNOSES  Final diagnoses:  Seizure (HCC)  Urinary tract infection without hematuria, site unspecified      Arnaldo NatalPaul F Rance Smithson, MD 05/13/15 [id number]

## 2015-05-12 NOTE — ED Notes (Signed)
EMS reported that pt's o2 89% on RA and put on [facility]2LNC and brought up to 96%.  Pt has hx of CVA in 2008 and cervical fracture.  Pt is a quadriplegic per wife.  Wife states he normally has focal seizures, but had full tremor seizure tonight and was foaming at mouth.  Pt is contracted to upper extremities.  Pt rectal temp 95.4.  Dr. Langston MaskerShaevitz notified and pt to start on bair hugger to raise temperature.

## 2015-05-13 ENCOUNTER — Inpatient Hospital Stay: Payer: 59

## 2015-05-13 ENCOUNTER — Encounter: Payer: Self-pay | Admitting: Internal Medicine

## 2015-05-13 DIAGNOSIS — I2510 Atherosclerotic heart disease of native coronary artery without angina pectoris: Secondary | ICD-10-CM | POA: Diagnosis present

## 2015-05-13 DIAGNOSIS — R569 Unspecified convulsions: Secondary | ICD-10-CM | POA: Diagnosis not present

## 2015-05-13 DIAGNOSIS — F419 Anxiety disorder, unspecified: Secondary | ICD-10-CM | POA: Diagnosis present

## 2015-05-13 DIAGNOSIS — K219 Gastro-esophageal reflux disease without esophagitis: Secondary | ICD-10-CM | POA: Diagnosis present

## 2015-05-13 DIAGNOSIS — N390 Urinary tract infection, site not specified: Secondary | ICD-10-CM | POA: Diagnosis present

## 2015-05-13 DIAGNOSIS — G8250 Quadriplegia, unspecified: Secondary | ICD-10-CM | POA: Diagnosis present

## 2015-05-13 LAB — CBC
HCT: 36.7 % — ABNORMAL LOW (ref 40.0–52.0)
Hemoglobin: 12.2 g/dL — ABNORMAL LOW (ref 13.0–18.0)
MCH: 30.2 pg (ref 26.0–34.0)
MCHC: 33.1 g/dL (ref 32.0–36.0)
MCV: 91 fL (ref 80.0–100.0)
PLATELETS: 168 10*3/uL (ref 150–440)
RBC: 4.03 MIL/uL — AB (ref 4.40–5.90)
RDW: 16.9 % — AB (ref 11.5–14.5)
WBC: 13.6 10*3/uL — AB (ref 3.8–10.6)

## 2015-05-13 LAB — LACTIC ACID, PLASMA
LACTIC ACID, VENOUS: 1.6 mmol/L (ref 0.5–2.0)
LACTIC ACID, VENOUS: 1.7 mmol/L (ref 0.5–2.0)

## 2015-05-13 LAB — URINALYSIS COMPLETE WITH MICROSCOPIC (ARMC ONLY)
BILIRUBIN URINE: NEGATIVE
GLUCOSE, UA: NEGATIVE mg/dL
KETONES UR: NEGATIVE mg/dL
NITRITE: NEGATIVE
Protein, ur: NEGATIVE mg/dL
Specific Gravity, Urine: 1.009 (ref 1.005–1.030)
pH: 5 (ref 5.0–8.0)

## 2015-05-13 LAB — LIPID PANEL
CHOL/HDL RATIO: 1.7 ratio
Cholesterol: 110 mg/dL (ref 0–200)
HDL: 63 mg/dL (ref >40)
LDL CALC: 37 mg/dL (ref 0–99)
Triglycerides: 50 mg/dL (ref <150)
VLDL: 10 mg/dL (ref 0–40)

## 2015-05-13 LAB — CREATININE, SERUM: CREATININE: 0.87 mg/dL (ref 0.61–1.24)

## 2015-05-13 LAB — MAGNESIUM: Magnesium: 1.5 mg/dL — ABNORMAL LOW (ref 1.7–2.4)

## 2015-05-13 LAB — HEPATIC FUNCTION PANEL
ALBUMIN: 3.7 g/dL (ref 3.5–5.0)
ALT: 22 U/L (ref 17–63)
AST: 23 U/L (ref 15–41)
Alkaline Phosphatase: 100 U/L (ref 38–126)
BILIRUBIN DIRECT: 0.3 mg/dL (ref 0.1–0.5)
BILIRUBIN TOTAL: 1.2 mg/dL (ref 0.3–1.2)
Indirect Bilirubin: 0.9 mg/dL (ref 0.3–0.9)
Total Protein: 7.8 g/dL (ref 6.5–8.1)

## 2015-05-13 LAB — GLUCOSE, CAPILLARY: Glucose-Capillary: 157 mg/dL — ABNORMAL HIGH (ref 65–99)

## 2015-05-13 LAB — TROPONIN I

## 2015-05-13 LAB — MRSA PCR SCREENING: MRSA by PCR: NEGATIVE

## 2015-05-13 MED ORDER — CLOPIDOGREL BISULFATE 75 MG PO TABS
75.0000 mg | ORAL_TABLET | Freq: Every day | ORAL | Status: DC
  Administered 2015-05-13: 75 mg via ORAL
  Filled 2015-05-13: qty 1

## 2015-05-13 MED ORDER — LORAZEPAM 2 MG/ML IJ SOLN: 2.0000 mg | INTRAMUSCULAR | Status: DC | PRN

## 2015-05-13 MED ORDER — LEVETIRACETAM 500 MG PO TABS: 500.0000 mg | ORAL_TABLET | Freq: Two times a day (BID) | ORAL | Status: DC

## 2015-05-13 MED ORDER — MAGNESIUM SULFATE 2 GM/50ML IV SOLN
2.0000 g | Freq: Once | INTRAVENOUS | Status: AC
  Administered 2015-05-13: 2 g via INTRAVENOUS
  Filled 2015-05-13: qty 50

## 2015-05-13 MED ORDER — DEXTROSE 5 % IV SOLN
1.0000 g | INTRAVENOUS | Status: DC
  Administered 2015-05-13: 1 g via INTRAVENOUS
  Filled 2015-05-13: qty 10

## 2015-05-13 MED ORDER — CETYLPYRIDINIUM CHLORIDE 0.05 % MT LIQD
7.0000 mL | Freq: Two times a day (BID) | OROMUCOSAL | Status: DC
  Administered 2015-05-13: 7 mL via OROMUCOSAL

## 2015-05-13 MED ORDER — ACETAMINOPHEN 325 MG PO TABS
650.0000 mg | ORAL_TABLET | Freq: Four times a day (QID) | ORAL | Status: DC | PRN
Start: 2015-05-13 — End: 2015-05-13

## 2015-05-13 MED ORDER — PANTOPRAZOLE SODIUM 40 MG PO TBEC
40.0000 mg | DELAYED_RELEASE_TABLET | Freq: Every day | ORAL | Status: DC
  Administered 2015-05-13: 40 mg via ORAL
  Filled 2015-05-13: qty 1

## 2015-05-13 MED ORDER — GADOBENATE DIMEGLUMINE 529 MG/ML IV SOLN
20.0000 mL | Freq: Once | INTRAVENOUS | Status: AC | PRN
  Administered 2015-05-13: 15:00:00 17 mL via INTRAVENOUS

## 2015-05-13 MED ORDER — ENOXAPARIN SODIUM 40 MG/0.4ML ~~LOC~~ SOLN
40.0000 mg | SUBCUTANEOUS | Status: DC
  Administered 2015-05-13: 15:00:00 40 mg via SUBCUTANEOUS
  Filled 2015-05-13: qty 0.4

## 2015-05-13 MED ORDER — ONDANSETRON HCL 4 MG PO TABS: 4.0000 mg | ORAL_TABLET | Freq: Four times a day (QID) | ORAL | Status: DC | PRN

## 2015-05-13 MED ORDER — LEVETIRACETAM 500 MG PO TABS
500.0000 mg | ORAL_TABLET | Freq: Two times a day (BID) | ORAL | Status: DC
  Administered 2015-05-13: 500 mg via ORAL
  Filled 2015-05-13: qty 1

## 2015-05-13 MED ORDER — ACETAMINOPHEN 650 MG RE SUPP: 650.0000 mg | Freq: Four times a day (QID) | RECTAL | Status: DC | PRN

## 2015-05-13 MED ORDER — CEPHALEXIN 500 MG PO CAPS: 500.0000 mg | ORAL_CAPSULE | Freq: Three times a day (TID) | ORAL | Status: DC

## 2015-05-13 MED ORDER — SODIUM CHLORIDE 0.9% FLUSH
3.0000 mL | Freq: Two times a day (BID) | INTRAVENOUS | Status: DC
  Administered 2015-05-13: 3 mL via INTRAVENOUS

## 2015-05-13 MED ORDER — SODIUM CHLORIDE 0.9 % IV SOLN
1000.0000 mg | Freq: Once | INTRAVENOUS | Status: AC
  Administered 2015-05-13: 1000 mg via INTRAVENOUS
  Filled 2015-05-13: qty 10

## 2015-05-13 MED ORDER — ONDANSETRON HCL 4 MG/2ML IJ SOLN: 4.0000 mg | Freq: Four times a day (QID) | INTRAMUSCULAR | Status: DC | PRN

## 2015-05-13 MED ORDER — CEFTRIAXONE SODIUM 1 G IJ SOLR
1.0000 g | Freq: Once | INTRAMUSCULAR | Status: AC
  Administered 2015-05-13: 1 g via INTRAVENOUS
  Filled 2015-05-13: qty 10

## 2015-05-13 MED ORDER — SODIUM CHLORIDE 0.9 % IV SOLN
500.0000 mg | Freq: Two times a day (BID) | INTRAVENOUS | Status: DC
  Filled 2015-05-13: qty 5

## 2015-05-13 NOTE — H&P (Signed)
[facility] [place] [facility]   PATIENT NAME: [name]    MR#:  [id number]  DATE OF BIRTH:  1950-04-01  DATE OF ADMISSION:  05/12/2015  PRIMARY CARE PHYSICIAN: Elizabeth Sauer, MD   REQUESTING/REFERRING PHYSICIAN: Darnelle Catalan, MD  CHIEF COMPLAINT:   Chief Complaint  Patient presents with  . Seizures    HISTORY OF PRESENT ILLNESS:  [name]  is a 65 y.o. male who presents with With tonic-clonic seizure. Patient does not recall anything from the seizure episode. History is taken from family who was present during the seizure. Patient's son states that he was talking with the patient in routine conversation with the patient suddenly became rigid and then began having shaking jerking motions all over. This lasted for about 10 minutes, and the patient was minimally responsive and confused for about an hour and a half. He had a couple of subsequent smaller seizure events in the ED, witnessed by nursing staff. Telemetry neurologist was called to see the patient and recommended initiation of Keppra by loading dose followed by twice a day dosing with admission for MRI and EEG and impression neurology consult.  PAST MEDICAL HISTORY:   Past Medical History  Diagnosis Date  . GERD (gastroesophageal reflux disease)   . Anxiety   . CAD (coronary artery disease)   . Hyperlipidemia   . Allergy   . Stroke (HCC)   . Spinal cord disease (HCC)   . Quadriplegia (HCC)     PAST SURGICAL HISTORY:   Past Surgical History  Procedure Laterality Date  . Endarterectomy    . Endarterectomy    . Tracheostomy      SOCIAL HISTORY:   Social History  Substance Use Topics  . Smoking status: Never Smoker   . Smokeless tobacco: Not on file  . Alcohol Use: No    FAMILY HISTORY:   Family History  Problem Relation Age of Onset  . Stroke Father     DRUG ALLERGIES:  No Known Allergies  MEDICATIONS AT HOME:   Prior to Admission medications   Medication Sig  Start Date End Date Taking? Authorizing Provider  atorvastatin (LIPITOR) 10 MG tablet Take 1 tablet by mouth  daily 03/23/15  Yes Duanne Limerick, MD  buPROPion [facility]) 75 MG tablet Take 1 tablet by mouth two  times daily 03/23/15  Yes Duanne Limerick, MD  clopidogrel (PLAVIX) 75 MG tablet Take 1 tablet by mouth  daily 03/23/15  Yes Duanne Limerick, MD  diazepam (VALIUM) 5 MG tablet Take 1 tablet by mouth 3 (three) times daily. 03/02/15  Yes Historical Provider, MD  furosemide (LASIX) 20 MG tablet Take 1 tablet (20 mg total) by mouth daily. 05/06/15  Yes Duanne Limerick, MD  metFORMIN (GLUCOPHAGE) 500 MG tablet Take 1 tablet (500 mg total) by mouth 2 (two) times daily with a meal. 03/02/15  Yes Duanne Limerick, MD  oxybutynin (DITROPAN) 5 MG tablet Take 1 tablet by mouth 3  times daily 03/25/15  Yes Duanne Limerick, MD  pantoprazole (PROTONIX) 40 MG tablet Take 1 tablet by mouth  daily 03/23/15  Yes Duanne Limerick, MD  tizanidine (ZANAFLEX) 6 MG capsule Take 1 capsule by mouth 2 (two) times daily. 03/02/15  Yes Historical Provider, MD  ALPRAZolam Prudy Feeler) 0.25 MG tablet Take 1 tablet (0.25 mg total) by mouth at bedtime. 02/23/15   Duanne Limerick, MD    REVIEW OF SYSTEMS:  Review of Systems  Constitutional: Negative for  fever, chills, weight loss and malaise/fatigue.  HENT: Negative for ear pain, hearing loss and tinnitus.   Eyes: Negative for blurred vision, double vision, pain and redness.  Respiratory: Negative for cough, hemoptysis and shortness of breath.   Cardiovascular: Negative for chest pain, palpitations, orthopnea and leg swelling.  Gastrointestinal: Negative for nausea, vomiting, abdominal pain, diarrhea and constipation.  Genitourinary: Negative for dysuria, frequency and hematuria.  Musculoskeletal: Negative for back pain, joint pain and neck pain.  Skin:       No acne, rash, or lesions  Neurological: Positive for seizures. Negative for dizziness, tremors, focal weakness and weakness.        Chronic quadriplegia  Endo/Heme/Allergies: Negative for polydipsia. Does not bruise/bleed easily.  Psychiatric/Behavioral: Negative for depression. The patient is not nervous/anxious and does not have insomnia.      VITAL SIGNS:   Filed Vitals:   05/13/15 0000 05/13/15 0030 05/13/15 0100 05/13/15 0130  BP: 118/75 109/70 94/54 107/51  Pulse: 54 59 56 72  Temp:      TempSrc:      Resp: SpO2: 98% 96% 96% 100%   Wt Readings from Last 3 Encounters:  08/31/14 83.915 kg (185 lb)    PHYSICAL EXAMINATION:  Physical Exam  Vitals reviewed. Constitutional: He is oriented to person, place, and time. He appears well-developed and well-nourished. No distress.  HENT:  Head: Normocephalic and atraumatic.  Mouth/Throat: Oropharynx is clear and moist.  Eyes: Conjunctivae and EOM are normal. Pupils are equal, round, and reactive to light. No scleral icterus.  Neck: Normal range of motion. Neck supple. No JVD present. No thyromegaly present.  Cardiovascular: Normal rate, regular rhythm and intact distal pulses.  Exam reveals no gallop and no friction rub.   No murmur heard. Respiratory: Effort normal and breath sounds normal. No respiratory distress. He has no wheezes. He has no rales.  GI: Soft. Bowel sounds are normal. He exhibits no distension. There is no tenderness.  Musculoskeletal: Normal range of motion. He exhibits no edema.  Mild bilateral upper extremity contracture. No arthritis, no gout  Lymphadenopathy:    He has no cervical adenopathy.  Neurological: He is alert and oriented to person, place, and time. No cranial nerve deficit.  No dysarthria, no aphasia.  Patient is quadriplegic  Skin: Skin is warm and dry. No rash noted. No erythema.  Psychiatric: He has a normal mood and affect. His behavior is normal. Judgment and thought content normal.    LABORATORY PANEL:   CBC  Recent Labs Lab 05/12/15 2236  WBC 5.6  HGB 12.0*  HCT 36.4*  PLT 173    ------------------------------------------------------------------------------------------------------------------  Chemistries   Recent Labs Lab 05/12/15 2236  NA 136  K 4.3  CL 101  CO2 26  GLUCOSE 159*  BUN 20  CREATININE 1.04  CALCIUM 8.9   ------------------------------------------------------------------------------------------------------------------  Cardiac Enzymes No results for input(s): TROPONINI in the last 168 hours. ------------------------------------------------------------------------------------------------------------------  RADIOLOGY:  Ct Head Wo Contrast  05/12/2015  CLINICAL DATA:  Initial evaluation for acute seizure. EXAM: CT HEAD WITHOUT CONTRAST TECHNIQUE: Contiguous axial images were obtained from the base of the skull through the vertex without intravenous contrast. COMPARISON:  Prior CT from 08/11/2012. FINDINGS: Encephalomalacia within the high left frontoparietal region compatible with remote left MCA territory infarct. This is stable from prior. Generalized cerebral atrophy with mild chronic small vessel ischemic disease present. Scattered vascular calcifications within the carotid siphons. No acute large vessel territory infarct. No acute intracranial hemorrhage.  No mass lesion, midline shift, or mass effect. No hydrocephalus. No extra-axial fluid collection. Scalp soft tissues within normal limits. No acute abnormality about the orbits. Chronic left frontal sinus disease noted. Paranasal sinuses are otherwise clear. No mastoid effusion. Calvarium intact. IMPRESSION: 1. No acute intracranial process. 2. Remote left MCA territory infarct. 3. Mild atrophy with chronic small vessel ischemic disease. 4. Chronic left frontal sinusitis. Electronically Signed   By: Rise MuBenjamin  McClintock M.D.   On: 05/12/2015 23:57   Dg Chest Portable 1 View  05/13/2015  CLINICAL DATA:  Seizure. EXAM: PORTABLE CHEST 1 VIEW COMPARISON:  08/11/2012 FINDINGS: No cardiomegaly for  technique. Negative aortic contours. Question hiatal hernia. Bronchitic type interstitial coarsening. Low lung volumes. No edema, effusion, or pneumothorax. IMPRESSION: Bronchitic markings without focal consolidation or edema. Electronically Signed   By: Marnee SpringJonathon  Watts M.D.   On: 05/13/2015 00:01    EKG:   Orders placed or performed during the hospital encounter of 05/12/15  . EKG 12-Lead  . EKG 12-Lead  . ED EKG  . ED EKG    IMPRESSION AND PLAN:  Principal Problem:   Seizure (HCC) - patient's family gives history of patient having smaller episodes of seizure-like activity for some time. He has had prior workup, but was never officially diagnosed with seizures are started on antiepileptic medications. They state that his episode tonight was the worst episode he's ever had, and it was clearly witnessed by clinical staff here in the ED. Will admit with workup as per recommendations by telemetry neurologist, see history of present illness for details. Active Problems:   UTI (lower urinary tract infection) - patient has suprapubic catheter, he has too numerous to count white cells on UA. Unclear if this is simply colonization or infection, the patient does not have a history of frequent recurrent UTIs. Family states that his urine has been foul-smelling recently. We will treat this as UTI, as it could also be a contributing factor to precipitating his seizure.   CAD (coronary artery disease) - continue patient's home meds, specifically Plavix.   Anxiety - continue home medications   HLD (hyperlipidemia) - continue his home meds once patient is no longer nothing by mouth   GERD (gastroesophageal reflux disease) - home dose PPI  All the records are reviewed and case discussed with ED provider. Management plans discussed with the patient and/or family.  DVT PROPHYLAXIS: SubQ lovenox  GI PROPHYLAXIS: PPI  ADMISSION STATUS: Inpatient  CODE STATUS: Full Code Status History    This patient  does not have a recorded code status. Please follow your organizational policy for patients in this situation.    Advance Directive Documentation        Most Recent Value   Type of Advance Directive  Healthcare Power of Attorney, Living will   Pre-existing out of facility DNR order (yellow form or pink MOST form)     "MOST" Form in Place?        TOTAL TIME TAKING CARE OF THIS PATIENT: 45 minutes.    Endre Coutts FIELDING 05/13/2015, 2:23 AM  [facility]  Office  [phone]  CC: Primary care physician; Elizabeth Sauereanna Jones, MD

## 2015-05-13 NOTE — Progress Notes (Signed)
Pt has remained A & O x 4. NSR on cardiac monitor. 2LNC upon morning assessment, but is now on RA. Lung sounds are clear to auscultation. Upon assessment, pt's wife was at bedside and stated that she had gave him something to drink, that his mouth was dry. I informed the wife that the pt is NPO and needs a proper swallow screen from the nurse; that we could also give wet sponges and mouth care. I educated her on aspiration precautions given he is eclamptic; however, she was very persistent and insisted that he was fine. I performed a swallow screen and the pt was ordered a regular diet per Dr Nemiah CommanderKalisetti.

## 2015-05-13 NOTE — Consult Note (Signed)
Reason for Consult:Seizure Referring Physician: Nemiah Commander  CC: Seizure  HPI: [name] is an 65 y.o. male with a history of stroke and spinal cord injury who presents with tonic-clonic seizure. Patient does not recall anything from the seizure episode. History is taken from family who was present during the seizure. Patient's son states that he was talking with the patient in routine conversation with the patient suddenly became rigid and then began having shaking jerking motions all over. This lasted for about 10 minutes, and the patient was minimally responsive and confused for about an hour and a half. He had a couple of subsequent smaller seizure events in the ED, witnessed by nursing staff. Telemetry neurologist was called to see the patient and recommended initiation of Keppra.   Per family patient has been having seizures for some time that are usually characterized by starring spells.  They have opted to not be on anticonvulsant therapy in the past.  Work up has been unremarkable in the past.    Past Medical History  Diagnosis Date  . GERD (gastroesophageal reflux disease)   . Anxiety   . CAD (coronary artery disease)   . Hyperlipidemia   . Allergy   . Stroke (HCC)   . Spinal cord disease (HCC)   . Quadriplegia [facility])     Past Surgical History  Procedure Laterality Date  . Endarterectomy    . Endarterectomy    . Tracheostomy      Family History  Problem Relation Age of Onset  . Stroke Father     Social History:  reports that he has never smoked. He does not have any smokeless tobacco history on file. He reports that he does not drink alcohol or use illicit drugs.  No Known Allergies  Medications:  I have reviewed the patient's current medications. Prior to Admission:  Prescriptions prior to admission  Medication Sig Dispense Refill Last Dose  . atorvastatin (LIPITOR) 10 MG tablet Take 1 tablet by mouth  daily 90 tablet 0 05/12/2015 at Unknown time  . buPROPion  (WELLBUTRIN) 75 MG tablet Take 1 tablet by mouth two  times daily 180 tablet 0 05/12/2015 at Unknown time  . clopidogrel (PLAVIX) 75 MG tablet Take 1 tablet by mouth  daily 90 tablet 0 05/12/2015 at Unknown time  . diazepam (VALIUM) 5 MG tablet Take 1 tablet by mouth 3 (three) times daily.   05/12/2015 at Unknown time  . furosemide (LASIX) 20 MG tablet Take 1 tablet (20 mg total) by mouth daily. 90 tablet 1 05/12/2015 at Unknown time  . metFORMIN (GLUCOPHAGE) 500 MG tablet Take 1 tablet (500 mg total) by mouth 2 (two) times daily with a meal. 180 tablet 3 05/12/2015 at Unknown time  . oxybutynin (DITROPAN) 5 MG tablet Take 1 tablet by mouth 3  times daily 270 tablet 0 05/12/2015 at Unknown time  . pantoprazole (PROTONIX) 40 MG tablet Take 1 tablet by mouth  daily 90 tablet 0 05/12/2015 at Unknown time  . tizanidine (ZANAFLEX) 6 MG capsule Take 1 capsule by mouth 2 (two) times daily.   05/12/2015 at Unknown time  . ALPRAZolam (XANAX) 0.25 MG tablet Take 1 tablet (0.25 mg total) by mouth at bedtime. 30 tablet 5    Scheduled: . antiseptic oral rinse  7 mL Mouth Rinse BID  . cefTRIAXone (ROCEPHIN)  IV  1 g Intravenous Q24H  . clopidogrel  75 mg Oral Daily  . enoxaparin (LOVENOX) injection  40 mg Subcutaneous Q24H  . levETIRAcetam  500 mg Intravenous Q12H  . pantoprazole  40 mg Oral Daily  . sodium chloride flush  3 mL Intravenous Q12H    ROS: History obtained from the patient  General ROS: negative for - chills, fatigue, fever, night sweats, weight gain or weight loss Psychological ROS: negative for - behavioral disorder, hallucinations, memory difficulties, mood swings or suicidal ideation Ophthalmic ROS: negative for - blurry vision, double vision, eye pain or loss of vision ENT ROS: negative for - epistaxis, nasal discharge, oral lesions, sore throat, tinnitus or vertigo Allergy and Immunology ROS: negative for - hives or itchy/watery eyes Hematological and Lymphatic ROS: negative for - bleeding  problems, bruising or swollen lymph nodes Endocrine ROS: negative for - galactorrhea, hair pattern changes, polydipsia/polyuria or temperature intolerance Respiratory ROS: negative for - cough, hemoptysis, shortness of breath or wheezing Cardiovascular ROS: negative for - chest pain, dyspnea on exertion, edema or irregular heartbeat Gastrointestinal ROS: negative for - abdominal pain, diarrhea, hematemesis, nausea/vomiting or stool incontinence Genito-Urinary ROS: negative for - dysuria, hematuria, incontinence or urinary frequency/urgency Musculoskeletal ROS: negative for - joint swelling or muscular weakness Neurological ROS: as noted in HPI Dermatological ROS: negative for rash and skin lesion changes  Physical Examination: Blood pressure 108/55, pulse 110, temperature 98 F (36.7 C), temperature source Oral, resp. rate 17, SpO2 98 %.  HEENT-  Normocephalic, no lesions, without obvious abnormality.  Normal external eye and conjunctiva.  Normal TM's bilaterally.  Normal auditory canals and external ears. Normal external nose, mucus membranes and septum.  Normal pharynx. Cardiovascular- S1, S2 normal, pulses palpable throughout   Lungs- chest clear, no wheezing, rales, normal symmetric air entry Abdomen- soft, non-tender; bowel sounds normal; no masses,  no organomegaly Extremities- bilateral lower extremity edema Lymph-no adenopathy palpable Musculoskeletal-increased tone throughout Skin-warm and dry, no hyperpigmentation, vitiligo, or suspicious lesions  Neurological Examination Mental Status: Alert, oriented, thought content appropriate.  Speech fluent without evidence of aphasia.  Able to follow 3 step commands without difficulty. Cranial Nerves: II: Discs flat bilaterally; Visual fields grossly normal, pupils equal, round, reactive to light and accommodation III,IV, VI: ptosis not present, extra-ocular motions intact bilaterally V,VII: left facial droop, facial light touch  sensation normal bilaterally VIII: hearing normal bilaterally IX,X: gag reflex present XI: no shoulder shrug XII: midline tongue extension Motor: Increased tone throughout.  Arms in flexion.  No movement noted in the extremities.   Deep Tendon Reflexes: 3-4+ throughout.  Sustained ankle clonus bilaterally Plantars: Right: upgoing   Left: upgoing Cerebellar: Unable to perform Gait: Unable to perform    Laboratory Studies:   Basic Metabolic Panel:  Recent Labs Lab 05/12/15 2236 05/13/15 0550  NA 136  --   K 4.3  --   CL 101  --   CO2 26  --   GLUCOSE 159*  --   BUN 20  --   CREATININE 1.04 0.87  CALCIUM 8.9  --   MG  --  1.5*    Liver Function Tests:  Recent Labs Lab 05/13/15 0550  AST 23  ALT 22  ALKPHOS 100  BILITOT 1.2  PROT 7.8  ALBUMIN 3.7   No results for input(s): LIPASE, AMYLASE in the last 168 hours. No results for input(s): AMMONIA in the last 168 hours.  CBC:  Recent Labs Lab 05/12/15 2236 05/13/15 0550  WBC 5.6 13.6*  HGB 12.0* 12.2*  HCT 36.4* 36.7*  MCV 92.1 91.0  PLT 173 168    Cardiac Enzymes:  Recent Labs Lab 05/13/15 0550  TROPONINI <0.03    BNP: Invalid input(s): POCBNP  CBG:  Recent Labs Lab 05/13/15 0411  GLUCAP 157*    Microbiology: Results for orders placed or performed during the hospital encounter of 05/12/15  MRSA PCR Screening     Status: None   Collection Time: 05/13/15  4:37 AM  Result Value Ref Range Status   MRSA by PCR NEGATIVE NEGATIVE Final    Comment:        The GeneXpert MRSA Assay (FDA approved for NASAL specimens only), is one component of a comprehensive MRSA colonization surveillance program. It is not intended to diagnose MRSA infection nor to guide or monitor treatment for MRSA infections.     Coagulation Studies: No results for input(s): LABPROT, INR in the last 72 hours.  Urinalysis:  Recent Labs Lab 05/12/15 2336  COLORURINE YELLOW*  LABSPEC 1.009  PHURINE 5.0   GLUCOSEU NEGATIVE  HGBUR 1+*  BILIRUBINUR NEGATIVE  KETONESUR NEGATIVE  PROTEINUR NEGATIVE  NITRITE NEGATIVE  LEUKOCYTESUR 3+*    Lipid Panel:     Component Value Date/Time   CHOL 110 05/13/2015 0550   CHOL 136 08/31/2014 0956   TRIG 50 05/13/2015 0550   HDL 63 05/13/2015 0550   HDL 62 08/31/2014 0956   CHOLHDL 1.7 05/13/2015 0550   CHOLHDL 2.2 08/31/2014 0956   VLDL 10 05/13/2015 0550   LDLCALC 37 05/13/2015 0550   LDLCALC 60 08/31/2014 0956    HgbA1C:  Lab Results  Component Value Date   HGBA1C 6.4* 02/23/2015    Urine Drug Screen:     Component Value Date/Time   LABOPIA NEGATIVE 08/11/2012 1517   LABBENZ POSITIVE 08/11/2012 1517   AMPHETMU NEGATIVE 08/11/2012 1517   THCU NEGATIVE 08/11/2012 1517   LABBARB NEGATIVE 08/11/2012 1517    Alcohol Level: No results for input(s): ETH in the last 168 hours.  Other results: EKG: sinus rhythm at 60 bpm.  Imaging: Ct Head Wo Contrast  05/12/2015  CLINICAL DATA:  Initial evaluation for acute seizure. EXAM: CT HEAD WITHOUT CONTRAST TECHNIQUE: Contiguous axial images were obtained from the base of the skull through the vertex without intravenous contrast. COMPARISON:  Prior CT from 08/11/2012. FINDINGS: Encephalomalacia within the high left frontoparietal region compatible with remote left MCA territory infarct. This is stable from prior. Generalized cerebral atrophy with mild chronic small vessel ischemic disease present. Scattered vascular calcifications within the carotid siphons. No acute large vessel territory infarct. No acute intracranial hemorrhage. No mass lesion, midline shift, or mass effect. No hydrocephalus. No extra-axial fluid collection. Scalp soft tissues within normal limits. No acute abnormality about the orbits. Chronic left frontal sinus disease noted. Paranasal sinuses are otherwise clear. No mastoid effusion. Calvarium intact. IMPRESSION: 1. No acute intracranial process. 2. Remote left MCA territory infarct.  3. Mild atrophy with chronic small vessel ischemic disease. 4. Chronic left frontal sinusitis. Electronically Signed   By: Rise MuBenjamin  McClintock M.D.   On: 05/12/2015 23:57   Dg Chest Portable 1 View  05/13/2015  CLINICAL DATA:  Seizure. EXAM: PORTABLE CHEST 1 VIEW COMPARISON:  08/11/2012 FINDINGS: No cardiomegaly for technique. Negative aortic contours. Question hiatal hernia. Bronchitic type interstitial coarsening. Low lung volumes. No edema, effusion, or pneumothorax. IMPRESSION: Bronchitic markings without focal consolidation or edema. Electronically Signed   By: Marnee SpringJonathon  Watts M.D.   On: 05/13/2015 00:01     Assessment/Plan: 65 year old male with a history of seizures, not treated with anticonvulsant therapy in the past.  Most seizures in the past have  been starring spells.  This seizure more of a generalized tonic-clonic seizure by description. Patient agreeable to anticonvulsant therapy at this time. Patient loaded with Keppra.    Recommendations: 1.  Keppra  BID 2.  D/C  Wellbutrin 3.  Seizure precautions 4.  Would defer EEG 5.  MRI of the brain pending.  No further neurological work up at this time if MRI shows no acute changes.    Thana Farr, MD Neurology [phone] 05/13/2015, 9:39 AM

## 2015-05-13 NOTE — Progress Notes (Signed)
[facility] [place] [facility]   PATIENT NAME: [name] BrasRichard [name]    MR#:  [id number]  DATE OF BIRTH:  [birth year]  SUBJECTIVE:  CHIEF COMPLAINT:   Chief Complaint  Patient presents with  . Seizures   - C4 injury adn quadriplegia since 2011, h/o CVA and seizures- but worse tonic clonic seizures last night and admitted for status - back to baseline mental status- loaded with keppra - no further seizures today  REVIEW OF SYSTEMS:  Review of Systems  Constitutional: Positive for malaise/fatigue. Negative for fever and chills.  HENT: Negative for ear discharge, ear pain and nosebleeds.   Eyes: Negative for blurred vision, double vision and photophobia.  Respiratory: Negative for cough, shortness of breath and wheezing.   Cardiovascular: Negative for chest pain and palpitations.  Gastrointestinal: Negative for nausea, vomiting, abdominal pain, diarrhea and constipation.  Genitourinary: Negative for dysuria and urgency.       Chronic suprapubic foley  Musculoskeletal: Positive for myalgias and back pain. Negative for neck pain.  Neurological: Positive for focal weakness and seizures. Negative for dizziness, sensory change, speech change and headaches.  Psychiatric/Behavioral: Negative for depression. The patient is not nervous/anxious.     DRUG ALLERGIES:  No Known Allergies  VITALS:  Blood pressure 108/55, pulse 110, temperature 98 F (36.7 C), temperature source Oral, resp. rate 17, SpO2 98 %.  PHYSICAL EXAMINATION:  Physical Exam  GENERAL:  65 y.o.-year-old patient lying in the bed with no acute distress.  EYES: Pupils equal, round, reactive to light and accommodation. No scleral icterus. Extraocular muscles intact.  HEENT: Head atraumatic, normocephalic. Oropharynx and nasopharynx clear.  NECK:  Supple, no jugular venous distention. No thyroid enlargement, no tenderness.  LUNGS: Normal breath sounds bilaterally, no wheezing, rales,rhonchi or  crepitation. No use of accessory muscles of respiration. His bibasilar breath sounds CARDIOVASCULAR: S1, S2 normal. No murmurs, rubs, or gallops.  ABDOMEN: Soft, nontender, nondistended. Bowel sounds present. No organomegaly or mass. Suprapubic catheter is present  EXTREMITIES: No cyanosis, or clubbing. 1+ edema of both his legs and also of his arms. NEUROLOGIC: Cranial nerves II through XII are intact. Speech is slow and pressured. Upper extremities with flexion contraction noted, lower extremities with 1/5 strength and 3+ reflexes all over PSYCHIATRIC: The patient is alert and oriented x 3.  SKIN: No obvious rash, lesion, or ulcer.    LABORATORY PANEL:   CBC  Recent Labs Lab 05/13/15 0550  WBC 13.6*  HGB 12.2*  HCT 36.7*  PLT 168   ------------------------------------------------------------------------------------------------------------------  Chemistries   Recent Labs Lab 05/12/15 2236 05/13/15 0550  NA 136  --   K 4.3  --   CL 101  --   CO2 26  --   GLUCOSE 159*  --   BUN 20  --   CREATININE 1.04 0.87  CALCIUM 8.9  --   MG  --  1.5*  AST  --  23  ALT  --  22  ALKPHOS  --  100  BILITOT  --  1.2   ------------------------------------------------------------------------------------------------------------------  Cardiac Enzymes  Recent Labs Lab 05/13/15 0550  TROPONINI <0.03   ------------------------------------------------------------------------------------------------------------------  RADIOLOGY:  Ct Head Wo Contrast  05/12/2015  CLINICAL DATA:  Initial evaluation for acute seizure. EXAM: CT HEAD WITHOUT CONTRAST TECHNIQUE: Contiguous axial images were obtained from the base of the skull through the vertex without intravenous contrast. COMPARISON:  Prior CT from 08/11/2012. FINDINGS: Encephalomalacia within the high left frontoparietal region compatible with remote left MCA  territory infarct. This is stable from prior. Generalized cerebral atrophy with  mild chronic small vessel ischemic disease present. Scattered vascular calcifications within the carotid siphons. No acute large vessel territory infarct. No acute intracranial hemorrhage. No mass lesion, midline shift, or mass effect. No hydrocephalus. No extra-axial fluid collection. Scalp soft tissues within normal limits. No acute abnormality about the orbits. Chronic left frontal sinus disease noted. Paranasal sinuses are otherwise clear. No mastoid effusion. Calvarium intact. IMPRESSION: 1. No acute intracranial process. 2. Remote left MCA territory infarct. 3. Mild atrophy with chronic small vessel ischemic disease. 4. Chronic left frontal sinusitis. Electronically Signed   By: Rise Mu M.D.   On: 05/12/2015 23:57   Dg Chest Portable 1 View  05/13/2015  CLINICAL DATA:  Seizure. EXAM: PORTABLE CHEST 1 VIEW COMPARISON:  08/11/2012 FINDINGS: No cardiomegaly for technique. Negative aortic contours. Question hiatal hernia. Bronchitic type interstitial coarsening. Low lung volumes. No edema, effusion, or pneumothorax. IMPRESSION: Bronchitic markings without focal consolidation or edema. Electronically Signed   By: Marnee Spring M.D.   On: 05/13/2015 00:01    EKG:   Orders placed or performed during the hospital encounter of 05/12/15  . EKG 12-Lead  . EKG 12-Lead  . ED EKG  . 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  #1 generalized tonic-clonic seizures-known history of seizure disorder since his spinal cord injury. Also has an area of encephalomalacia on his brain from his prior stroke. -MRI of the brain ordered today. Right ear workup done in 2014 at  [facility] with a similar hospitalization. MRI and EEG at that time was negative. Patient was discharged on Keppra but hasn't ever taken it. -Patient was loaded with Keppra here in the hospital. Continue oral Keppra twice a day. -Neurology consulted. -Discontinue Wellbutrin as it decreases seizure threshold. -EEG ordered MRI ordered here again. Seizure precautions and neuro checks frequently. -Will be discharged on Keppra whenever all the workup is done. Patient can be transferred out of ICU.  #2 possible UTI-cultures have been sent. No prior history of frequent UTIs. Has a chronic suprapubic catheter that is being changed every week per wife. -Follows with the urologist as outpatient. -Continue Rocephin for now.  #3 GERD-on Protonix  #4 Hypomagnesemia- being replaced  #5 H/o Left MCA infarct- prior history of left carotid stenosis status post endarterectomy resulting in a stroke -Continue Plavix. Check lipid profile to see if he needs statin.  #6 DVT prophylaxis-on Lovenox   All the records are reviewed and case discussed with Care Management/Social Workerr. Management plans discussed with the patient, family and they are in agreement.  CODE STATUS: Full Code  TOTAL TIME TAKING CARE OF THIS PATIENT: 38 minutes.   POSSIBLE D/C IN 2 DAYS, DEPENDING ON CLINICAL CONDITION.   Enid Baas M.D on 05/13/2015 at 10:28 AM  Between 7am to 6pm - Pager - [phone]  After 6pm go to [email or web address] - password [email or web address] [facility]  [place]  [facility]  Office  [phone]  CC: Primary care physician; Elizabeth Sauer, MD

## 2015-05-13 NOTE — Progress Notes (Signed)
Pharmacy Antibiotic Note  Jaclyn ShaggyRichard C Volante is a 65 y.o. male admitted on 05/12/2015 with UTI.  Pharmacy has been consulted for ceftriaxone dosing.  Plan: Ceftriaxone 1 g IV daily     Temp (24hrs), Avg:95.4 F (35.2 C), Min:95.4 F (35.2 C), Max:95.4 F (35.2 C)   Recent Labs Lab 05/12/15 2236 05/12/15 2335  WBC 5.6  --   CREATININE 1.04  --   LATICACIDVEN  --  1.6    CrCl cannot be calculated (Unknown ideal weight.).    No Known Allergies   Thank you for allowing pharmacy to be a part of this patient's care.  Cindi CarbonMary M Maxi Carreras, PharmD 05/13/2015 4:25 AM

## 2015-05-13 NOTE — Progress Notes (Signed)
Patient discharged home per MD orders. Prescriptions given to wife. All discharge instructions given and all questions answered.

## 2015-05-13 NOTE — ED Notes (Signed)
Per EDP pt's temp probably due to previous cervical spine injury.  Per wife pt's temp stays that low and pt c/o feeling hot.  Bair hugger removed at this time.

## 2015-05-13 NOTE — Discharge Summary (Signed)
[facility] [place] at [facility]   PATIENT NAME: [name]    MR#:  [id number]  DATE OF BIRTH:  December 09, 1950  DATE OF ADMISSION:  05/12/2015 ADMITTING PHYSICIAN: Oralia Manis, MD  DATE OF DISCHARGE: 05/13/15  PRIMARY CARE PHYSICIAN: Elizabeth Sauer, MD    ADMISSION DIAGNOSIS:  Seizure (HCC) [R56.9] Urinary tract infection without hematuria, site unspecified [N39.0]  DISCHARGE DIAGNOSIS:  Principal Problem:   Seizure (HCC) Active Problems:   HLD (hyperlipidemia)   GERD (gastroesophageal reflux disease)   CAD (coronary artery disease)   Anxiety   Quadriplegia (HCC)   UTI (lower urinary tract infection)   SECONDARY DIAGNOSIS:   Past Medical History  Diagnosis Date  . GERD (gastroesophageal reflux disease)   . Anxiety   . CAD (coronary artery disease)   . Hyperlipidemia   . Allergy   . Stroke (HCC)   . Spinal cord disease (HCC)   . 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  #1 Generalized tonic-clonic seizures- known history of seizure disorder since his spinal cord injury. Also has an area of encephalomalacia on his brain from his prior stroke. -MRI of the brain showing old left parietal infarct. similar workup done in 2014 at [facility] with a similar hospitalization. MRI and EEG at that time was negative. Patient was discharged on Keppra but hasn't ever taken it. -Patient was loaded with Keppra here in the hospital. Continue oral Keppra twice a day. -Neurology consulted. -Discontinue Wellbutrin as it decreases seizure threshold. -family eager to take patient home since results are negative.  #2 possible  UTI-cultures have been sent. No prior history of frequent UTIs. Has a chronic suprapubic catheter that is being changed every week per wife. -Follows with the urologist as outpatient. -discharge on keppra  #3 GERD-on Protonix  #4 Hypomagnesemia- replaced  #5 H/o Left MCA infarct- prior history of left carotid stenosis status post endarterectomy resulting in a stroke -Continue Plavix.   Discharge home today  DISCHARGE CONDITIONS:   Guarded  CONSULTS OBTAINED:  Treatment Team:  Kym Groom, MD Thana Farr, MD  DRUG ALLERGIES:  No Known Allergies  DISCHARGE MEDICATIONS:   Current Discharge Medication List    START taking these medications   Details  cephALEXin (KEFLEX) 500 MG capsule Take 1 capsule (500 mg total) by mouth 3 (three) times daily. X 5 days Qty: 15 capsule, Refills: 0    levETIRAcetam (KEPPRA) 500 MG tablet Take 1 tablet (500 mg total) by mouth 2 (two) times daily. Qty: 60 tablet, Refills: 2      CONTINUE these medications which have NOT CHANGED   Details  atorvastatin (LIPITOR) 10 MG tablet Take 1 tablet by mouth  daily Qty: 90 tablet, Refills: 0    clopidogrel (PLAVIX) 75 MG tablet Take 1 tablet by mouth  daily Qty: 90 tablet, Refills: 0    diazepam (VALIUM) 5 MG tablet Take 1 tablet by mouth 3 (three) times daily.    furosemide (LASIX) 20 MG tablet Take 1 tablet (20 mg total) by mouth daily. Qty: 90 tablet, Refills: 1    metFORMIN (GLUCOPHAGE) 500 MG tablet Take 1 tablet (500 mg total) by mouth 2 (two) times daily  with a meal. Qty: 180 tablet, Refills: 3   Associated Diagnoses: Hyperglycemia    oxybutynin (DITROPAN) 5 MG tablet Take 1 tablet by mouth 3  times daily Qty: 270 tablet, Refills: 0    pantoprazole (PROTONIX) 40 MG tablet Take 1 tablet by mouth  daily Qty: 90 tablet, Refills: 0    tizanidine (ZANAFLEX) 6 MG capsule Take 1 capsule by mouth 2 (two) times daily.    ALPRAZolam (XANAX) 0.25 MG tablet Take 1 tablet (0.25 mg  total) by mouth at bedtime. Qty: 30 tablet, Refills: 5   Associated Diagnoses: Insomnia; Adjustment disorder with depressed mood      STOP taking these medications     buPROPion (WELLBUTRIN) 75 MG tablet          DISCHARGE INSTRUCTIONS:   1. PCP f/u in 1-2 weeks  If you experience worsening of your admission symptoms, develop shortness of breath, life threatening emergency, suicidal or homicidal thoughts you must seek medical attention immediately by calling 911 or calling your MD immediately  if symptoms less severe.  You Must read complete instructions/literature along with all the possible adverse reactions/side effects for all the Medicines you take and that have been prescribed to you. Take any new Medicines after you have completely understood and accept all the possible adverse reactions/side effects.   Please note  You were cared for by a hospitalist during your hospital stay. If you have any questions about your discharge medications or the care you received while you were in the hospital after you are discharged, you can call the unit and asked to speak with the hospitalist on call if the hospitalist that took care of you is not available. Once you are discharged, your primary care physician will handle any further medical issues. Please note that NO REFILLS for any discharge medications will be authorized once you are discharged, as it is imperative that you return to your primary care physician (or establish a relationship with a primary care physician if you do not have one) for your aftercare needs so that they can reassess your need for medications and monitor your lab values.    Today   CHIEF COMPLAINT:   Chief Complaint  Patient presents with  . Seizures    VITAL SIGNS:  Blood pressure 125/48, pulse 106, temperature 99.8 F (37.7 C), temperature source Oral, resp. rate 28, SpO2 93 %.  I/O:   Intake/Output Summary (Last 24 hours) at 05/13/15 1538 Last data  filed at 05/13/15 1031  Gross per 24 hour  Intake    106 ml  Output    550 ml  Net   -444 ml    PHYSICAL EXAMINATION:   Physical Exam  GENERAL: 65 y.o.-year-old patient lying in the bed with no acute distress.  EYES: Pupils equal, round, reactive to light and accommodation. No scleral icterus. Extraocular muscles intact.  HEENT: Head atraumatic, normocephalic. Oropharynx and nasopharynx clear.  NECK: Supple, no jugular venous distention. No thyroid enlargement, no tenderness.  LUNGS: Normal breath sounds bilaterally, no wheezing, rales,rhonchi or crepitation. No use of accessory muscles of respiration. His bibasilar breath sounds CARDIOVASCULAR: S1, S2 normal. No murmurs, rubs, or gallops.  ABDOMEN: Soft, nontender, nondistended. Bowel sounds present. No organomegaly or mass. Suprapubic catheter is present  EXTREMITIES: No cyanosis, or clubbing. 1+ edema of both his legs and also of his arms. NEUROLOGIC: Cranial nerves II through XII are intact. Speech is slow and pressured. Upper extremities with flexion contraction noted, lower extremities with 1/5  strength and 3+ reflexes all over PSYCHIATRIC: The patient is alert and oriented x 3.  SKIN: No obvious rash, lesion, or ulcer.   DATA REVIEW:   CBC  Recent Labs Lab 05/13/15 0550  WBC 13.6*  HGB 12.2*  HCT 36.7*  PLT 168    Chemistries   Recent Labs Lab 05/12/15 2236 05/13/15 0550  NA 136  --   K 4.3  --   CL 101  --   CO2 26  --   GLUCOSE 159*  --   BUN 20  --   CREATININE 1.04 0.87  CALCIUM 8.9  --   MG  --  1.5*  AST  --  23  ALT  --  22  ALKPHOS  --  100  BILITOT  --  1.2    Cardiac Enzymes  Recent Labs Lab 05/13/15 0550  TROPONINI <0.03    Microbiology Results  Results for orders placed or performed during the hospital encounter of 05/12/15  MRSA PCR Screening     Status: None   Collection Time: 05/13/15  4:37 AM  Result Value Ref Range Status   MRSA by PCR NEGATIVE NEGATIVE Final     Comment:        The GeneXpert MRSA Assay (FDA approved for NASAL specimens only), is one component of a comprehensive MRSA colonization surveillance program. It is not intended to diagnose MRSA infection nor to guide or monitor treatment for MRSA infections.     RADIOLOGY:  Ct Head Wo Contrast  05/12/2015  CLINICAL DATA:  Initial evaluation for acute seizure. EXAM: CT HEAD WITHOUT CONTRAST TECHNIQUE: Contiguous axial images were obtained from the base of the skull through the vertex without intravenous contrast. COMPARISON:  Prior CT from 08/11/2012. FINDINGS: Encephalomalacia within the high left frontoparietal region compatible with remote left MCA territory infarct. This is stable from prior. Generalized cerebral atrophy with mild chronic small vessel ischemic disease present. Scattered vascular calcifications within the carotid siphons. No acute large vessel territory infarct. No acute intracranial hemorrhage. No mass lesion, midline shift, or mass effect. No hydrocephalus. No extra-axial fluid collection. Scalp soft tissues within normal limits. No acute abnormality about the orbits. Chronic left frontal sinus disease noted. Paranasal sinuses are otherwise clear. No mastoid effusion. Calvarium intact. IMPRESSION: 1. No acute intracranial process. 2. Remote left MCA territory infarct. 3. Mild atrophy with chronic small vessel ischemic disease. 4. Chronic left frontal sinusitis. Electronically Signed   By: Rise Mu M.D.   On: 05/12/2015 23:57   Mr Laqueta Jean ZO Contrast  05/13/2015  CLINICAL DATA:  Seizures. EXAM: MRI HEAD WITHOUT AND WITH CONTRAST TECHNIQUE: Multiplanar, multiecho pulse sequences of the brain and surrounding structures were obtained without and with intravenous contrast. CONTRAST:  17mL MULTIHANCE GADOBENATE DIMEGLUMINE 529 MG/ML IV SOLN COMPARISON:  CT head 05/12/2015 FINDINGS: Mild atrophy.  Negative for hydrocephalus. Negative for acute infarct. Chronic left MCA  infarct in the left parietal lobe. Small amount of associated chronic hemorrhage. Mild chronic ischemia in the pons. Negative for mass or edema.  No shift of the midline structures. Medial temporal lobe normal in signal and volume. Negative for mesial temporal sclerosis. Postcontrast imaging reveals normal enhancement. No enhancing mass. Normal vascular enhancement. Mucosal edema left frontal sinus. Remaining sinuses clear. Normal orbit. Pituitary not enlarged. IMPRESSION: Chronic left MCA infarct in the left parietal lobe. No acute intracranial abnormality. Electronically Signed   By: Marlan Palau M.D.   On: 05/13/2015 14:54   Dg Chest Portable 1  View  05/13/2015  CLINICAL DATA:  Seizure. EXAM: PORTABLE CHEST 1 VIEW COMPARISON:  08/11/2012 FINDINGS: No cardiomegaly for technique. Negative aortic contours. Question hiatal hernia. Bronchitic type interstitial coarsening. Low lung volumes. No edema, effusion, or pneumothorax. IMPRESSION: Bronchitic markings without focal consolidation or edema. Electronically Signed   By: Marnee SpringJonathon  Watts M.D.   On: 05/13/2015 00:01    EKG:   Orders placed or performed during the hospital encounter of 05/12/15  . EKG 12-Lead  . EKG 12-Lead  . ED EKG  . ED EKG      Management plans discussed with the patient, family and they are in agreement.  CODE STATUS:     Code Status Orders        Start     Ordered   05/13/15 0413  Full code   Continuous     05/13/15 0412    Code Status History    Date Active Date Inactive Code Status Order ID Comments User Context   This patient has a current code status but no historical code status.    Advance Directive Documentation        Most Recent Value   Type of Advance Directive  Healthcare Power of Attorney, Living will   Pre-existing out of facility DNR order (yellow form or pink MOST form)     "MOST" Form in Place?        TOTAL TIME TAKING CARE OF THIS PATIENT: 37 minutes.    Enid BaasKALISETTI,Chan Rosasco M.D on  05/13/2015 at 3:38 PM  Between 7am to 6pm - Pager - [phone]  After 6pm go to [email or web address] - password [email or web address] [facility]  [facility] [place] [facility]  Office  [phone]  CC: Primary care physician; Elizabeth Sauereanna Jones, MD

## 2015-05-14 LAB — URINE CULTURE: Special Requests: NORMAL

## 2015-05-18 ENCOUNTER — Encounter: Payer: Self-pay | Admitting: Family Medicine

## 2015-05-18 ENCOUNTER — Ambulatory Visit (INDEPENDENT_AMBULATORY_CARE_PROVIDER_SITE_OTHER): Payer: 59 | Admitting: Family Medicine

## 2015-05-18 VITALS — BP 80/60 | HR 120

## 2015-05-18 DIAGNOSIS — F329 Major depressive disorder, single episode, unspecified: Secondary | ICD-10-CM | POA: Diagnosis not present

## 2015-05-18 DIAGNOSIS — G40909 Epilepsy, unspecified, not intractable, without status epilepticus: Secondary | ICD-10-CM | POA: Diagnosis not present

## 2015-05-18 DIAGNOSIS — Z09 Encounter for follow-up examination after completed treatment for conditions other than malignant neoplasm: Secondary | ICD-10-CM

## 2015-05-18 DIAGNOSIS — R7303 Prediabetes: Secondary | ICD-10-CM | POA: Diagnosis not present

## 2015-05-18 DIAGNOSIS — F32A Depression, unspecified: Secondary | ICD-10-CM

## 2015-05-18 DIAGNOSIS — R739 Hyperglycemia, unspecified: Secondary | ICD-10-CM

## 2015-05-18 MED ORDER — LEVETIRACETAM 500 MG PO TABS: 500.0000 mg | ORAL_TABLET | Freq: Two times a day (BID) | ORAL | Status: DC

## 2015-05-18 MED ORDER — CITALOPRAM HYDROBROMIDE 20 MG PO TABS: 20.0000 mg | ORAL_TABLET | Freq: Every day | ORAL | Status: DC

## 2015-05-18 MED ORDER — METFORMIN HCL 500 MG PO TABS: 500.0000 mg | ORAL_TABLET | Freq: Two times a day (BID) | ORAL | Status: DC

## 2015-05-18 NOTE — Progress Notes (Signed)
Name: [name]   MRN: [id number]    DOB: 1950/09/09   Date:05/18/2015       Progress Note  Subjective  Chief Complaint  Chief Complaint  Patient presents with  . Follow-up    Hospital discharge on 05/13/15-   . Diabetes    needs refill on med    Diabetes He presents for his follow-up diabetic visit. He has type 2 diabetes mellitus. His disease course has been stable. Hypoglycemia symptoms include seizures. Pertinent negatives for hypoglycemia include no dizziness, headaches, nervousness/anxiousness or sleepiness. Pertinent negatives for diabetes include no blurred vision, no chest pain, no fatigue, no foot paresthesias, no foot ulcerations, no polydipsia, no polyphagia, no polyuria, no visual change, no weakness and no weight loss. There are no hypoglycemic complications. Symptoms are stable. There are no diabetic complications. He is following a generally unhealthy diet. His home blood glucose trend is decreasing steadily. His breakfast blood glucose is taken between 8-9 am. His breakfast blood glucose range is generally 110-130 mg/dl. He does not see a podiatrist.Eye exam is not current.  Depression        This is a recurrent problem.  The onset quality is gradual.   Associated symptoms include no fatigue, does not have insomnia, no myalgias, no headaches and no suicidal ideas.  Past treatments include SSRIs - Selective serotonin reuptake inhibitors.  Compliance with treatment is good. Seizures  This is a new problem. The problem has been gradually improving. Pertinent negatives include no sleepiness, no headaches, no neck stiffness, no sore throat, no chest pain, no cough, no nausea and no diarrhea.    No problem-specific assessment & plan notes found for this encounter.   Past Medical History  Diagnosis Date  . GERD (gastroesophageal reflux disease)   . Anxiety   . CAD (coronary artery disease)   . Hyperlipidemia   . Allergy   . Stroke (HCC)   . Spinal cord disease (HCC)    . Quadriplegia (HCC)   . Seizures (HCC)   . Diabetes mellitus without complication [facility](HCC)     Past Surgical History  Procedure Laterality Date  . Endarterectomy    . Endarterectomy    . Tracheostomy      Family History  Problem Relation Age of Onset  . Stroke Father     Social History   Social History  . Marital Status: Married    Spouse Name: N/A  . Number of Children: N/A  . Years of Education: N/A   Occupational History  . Not on file.   Social History Main Topics  . Smoking status: Never Smoker   . Smokeless tobacco: Not on file  . Alcohol Use: No  . Drug Use: No  . Sexual Activity: Not on file   Other Topics Concern  . Not on file   Social History Narrative    No Known Allergies   Review of Systems  Constitutional: Negative for fever, chills, weight loss, malaise/fatigue and fatigue.  HENT: Negative for ear discharge, ear pain and sore throat.   Eyes: Negative for blurred vision.  Respiratory: Negative for cough, sputum production, shortness of breath and wheezing.   Cardiovascular: Negative for chest pain, palpitations and leg swelling.  Gastrointestinal: Negative for heartburn, nausea, abdominal pain, diarrhea, constipation, blood in stool and melena.  Genitourinary: Negative for dysuria, urgency, frequency and hematuria.  Musculoskeletal: Negative for myalgias, back pain, joint pain and neck pain.  Skin: Negative for rash.  Neurological: Positive for seizures. Negative for dizziness,  tingling, sensory change, focal weakness, weakness and headaches.  Endo/Heme/Allergies: Negative for environmental allergies, polydipsia and polyphagia. Does not bruise/bleed easily.  Psychiatric/Behavioral: Positive for depression. Negative for suicidal ideas. The patient is not nervous/anxious and does not have insomnia.      Objective  Filed Vitals:   05/18/15 1128  BP: 80/60  Pulse: 120    Physical Exam  Constitutional: He is oriented to person, place, and  time and well-developed, well-nourished, and in no distress.  HENT:  Head: Normocephalic.  Right Ear: External ear normal.  Left Ear: External ear normal.  Nose: Nose normal.  Mouth/Throat: Oropharynx is clear and moist.  Eyes: Conjunctivae and EOM are normal. Pupils are equal, round, and reactive to light. Right eye exhibits no discharge. Left eye exhibits no discharge. No scleral icterus.  Neck: Normal range of motion. Neck supple. No JVD present. No tracheal deviation present. No thyromegaly present.  Cardiovascular: Normal rate, regular rhythm, normal heart sounds and intact distal pulses.  Exam reveals no gallop and no friction rub.   No murmur heard. Pulmonary/Chest: Breath sounds normal. No respiratory distress. He has no wheezes. He has no rales.  Abdominal: Soft. Bowel sounds are normal. He exhibits no mass. There is no hepatosplenomegaly. There is no tenderness. There is no rebound, no guarding and no CVA tenderness.  Musculoskeletal: Normal range of motion.  Lymphadenopathy:    He has no cervical adenopathy.  Neurological: He is alert and oriented to person, place, and time. He has normal sensation, normal strength, normal reflexes and intact cranial nerves.  Skin: Skin is warm. No rash noted.  Psychiatric: Mood and affect normal.  Nursing note and vitals reviewed.     Assessment & Plan  Problem List Items Addressed This Visit    None    Visit Diagnoses    Hospital discharge follow-up    -  Primary    Seizure disorder (HCC)        Relevant Medications    levETIRAcetam (KEPPRA) 500 MG tablet    Other Relevant Orders    Ambulatory referral to Neurology    Depression        Relevant Medications    citalopram (CELEXA) 20 MG tablet    Hyperglycemia        Relevant Medications    metFORMIN (GLUCOPHAGE) 500 MG tablet    Prediabetes        Relevant Medications    metFORMIN (GLUCOPHAGE) 500 MG tablet         Dr. Hayden [facility]  05/18/2015

## 2015-05-20 ENCOUNTER — Other Ambulatory Visit: Payer: Self-pay

## 2015-05-20 DIAGNOSIS — F32A Depression, unspecified: Secondary | ICD-10-CM

## 2015-05-20 DIAGNOSIS — F329 Major depressive disorder, single episode, unspecified: Secondary | ICD-10-CM

## 2015-05-20 MED ORDER — CITALOPRAM HYDROBROMIDE 20 MG PO TABS
20.0000 mg | ORAL_TABLET | Freq: Every day | ORAL | Status: DC
Start: 2015-05-20 — End: 2015-08-15

## 2015-06-07 ENCOUNTER — Other Ambulatory Visit: Payer: Self-pay | Admitting: Family Medicine

## 2015-07-06 ENCOUNTER — Other Ambulatory Visit: Payer: Self-pay | Admitting: Family Medicine

## 2015-08-11 DIAGNOSIS — R569 Unspecified convulsions: Secondary | ICD-10-CM | POA: Diagnosis not present

## 2015-08-11 DIAGNOSIS — G8252 Quadriplegia, C1-C4 incomplete: Secondary | ICD-10-CM | POA: Diagnosis not present

## 2015-08-15 ENCOUNTER — Other Ambulatory Visit: Payer: Self-pay | Admitting: Family Medicine

## 2015-10-01 ENCOUNTER — Other Ambulatory Visit: Payer: Self-pay | Admitting: Family Medicine

## 2015-10-21 DIAGNOSIS — R569 Unspecified convulsions: Secondary | ICD-10-CM | POA: Diagnosis not present

## 2015-12-08 ENCOUNTER — Other Ambulatory Visit: Payer: Self-pay | Admitting: Family Medicine

## 2016-01-07 ENCOUNTER — Other Ambulatory Visit: Payer: Self-pay | Admitting: Internal Medicine

## 2016-01-12 ENCOUNTER — Ambulatory Visit (INDEPENDENT_AMBULATORY_CARE_PROVIDER_SITE_OTHER): Payer: Medicare Other | Admitting: Family Medicine

## 2016-01-12 DIAGNOSIS — L89102 Pressure ulcer of unspecified part of back, stage 2: Secondary | ICD-10-CM | POA: Diagnosis not present

## 2016-01-12 DIAGNOSIS — M2560 Stiffness of unspecified joint, not elsewhere classified: Secondary | ICD-10-CM | POA: Diagnosis not present

## 2016-01-12 DIAGNOSIS — Z23 Encounter for immunization: Secondary | ICD-10-CM

## 2016-01-12 DIAGNOSIS — S21201A Unspecified open wound of right back wall of thorax without penetration into thoracic cavity, initial encounter: Secondary | ICD-10-CM

## 2016-01-12 MED ORDER — SULFAMETHOXAZOLE-TRIMETHOPRIM 800-160 MG PO TABS: 1.0000 | ORAL_TABLET | Freq: Two times a day (BID) | ORAL | 0 refills | Status: DC

## 2016-01-12 NOTE — Addendum Note (Signed)
Addended by: Duanne LimerickJONES, Dejanique Ruehl C on: 01/12/2016 02:33 PM   Modules accepted: Orders

## 2016-01-12 NOTE — Progress Notes (Signed)
Name: [name]   MRN: [id number]    DOB: Jul 06, 1950   Date:01/12/2016       Progress Note  Subjective  Chief Complaint  No chief complaint on file.   Area on back / c/w pressure sore right scapula      No problem-specific Assessment & Plan notes found for this encounter.   Past Medical History:  Diagnosis Date  . Allergy   . Anxiety   . CAD (coronary artery disease)   . Diabetes mellitus without complication (HCC)   . GERD (gastroesophageal reflux disease)   . Hyperlipidemia   . Quadriplegia (HCC)   . Seizures (HCC)   . Spinal cord disease (HCC)   . Stroke [facility](HCC)     Past Surgical History:  Procedure Laterality Date  . ENDARTERECTOMY    . ENDARTERECTOMY    . TRACHEOSTOMY      Family History  Problem Relation Age of Onset  . Stroke Father     Social History   Social History  . Marital status: Married    Spouse name: N/A  . Number of children: N/A  . Years of education: N/A   Occupational History  . Not on file.   Social History Main Topics  . Smoking status: Never Smoker  . Smokeless tobacco: Not on file  . Alcohol use No  . Drug use: No  . Sexual activity: Not on file   Other Topics Concern  . Not on file   Social History Narrative  . No narrative on file    No Known Allergies   Review of Systems  Constitutional: Negative for chills, fever, malaise/fatigue and weight loss.  HENT: Negative for ear discharge, ear pain and sore throat.   Eyes: Negative for blurred vision.  Respiratory: Negative for cough, sputum production, shortness of breath and wheezing.   Cardiovascular: Negative for chest pain, palpitations and leg swelling.  Gastrointestinal: Negative for abdominal pain, blood in stool, constipation, diarrhea, heartburn, melena and nausea.  Genitourinary: Negative for dysuria, frequency, hematuria and urgency.  Musculoskeletal: Negative for back pain, joint pain, myalgias and neck pain.  Skin: Negative for rash.  Neurological:  Negative for dizziness, tingling, sensory change, focal weakness and headaches.  Endo/Heme/Allergies: Negative for environmental allergies and polydipsia. Does not bruise/bleed easily.  Psychiatric/Behavioral: Negative for depression and suicidal ideas. The patient is not nervous/anxious and does not have insomnia.      Objective  There were no vitals filed for this visit.  Physical Exam  Constitutional: He is oriented to person, place, and time and well-developed, well-nourished, and in no distress.  HENT:  Head: Normocephalic.  Right Ear: External ear normal.  Left Ear: External ear normal.  Nose: Nose normal.  Mouth/Throat: Oropharynx is clear and moist.  Eyes: Conjunctivae and EOM are normal. Pupils are equal, round, and reactive to light.  Neck: Normal range of motion. Neck supple.  Cardiovascular: Normal rate, regular rhythm, normal heart sounds and intact distal pulses.   Pulmonary/Chest: Breath sounds normal.  Abdominal: Soft. Bowel sounds are normal. There is no hepatosplenomegaly. There is no CVA tenderness.  Musculoskeletal: Normal range of motion.  Neurological: He is alert and oriented to person, place, and time. He has normal sensation, normal strength, normal reflexes and intact cranial nerves.  Skin: Skin is warm. Rash noted. Rash is macular. There is erythema.     Psychiatric: Mood and affect normal.      Assessment & Plan  Problem List Items Addressed This Visit  None    Visit Diagnoses    Flu vaccine need    -  Primary   Relevant Orders   Flu Vaccine QUAD 36+ mos PF IM (Fluarix & Fluzone Quad PF) (Completed)   Need for pneumococcal vaccination       Relevant Orders   Pneumococcal conjugate vaccine 13-valent (Completed)   Wound of right side of back, initial encounter       Relevant Medications   sulfamethoxazole-trimethoprim (BACTRIM DS,SEPTRA DS) 800-160 MG tablet   Other Relevant Orders   Ambulatory referral to Wound Clinic   Decreased range of  motion       Relevant Orders   Ambulatory referral to Physical Therapy        Dr. Elizabeth Sauereanna Xuan [facility]  01/12/16

## 2016-01-24 ENCOUNTER — Encounter: Payer: Medicare Other | Attending: Internal Medicine | Admitting: Internal Medicine

## 2016-01-24 DIAGNOSIS — X58XXXS Exposure to other specified factors, sequela: Secondary | ICD-10-CM | POA: Diagnosis not present

## 2016-01-24 DIAGNOSIS — L89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, stage 3: Secondary | ICD-10-CM | POA: Insufficient documentation

## 2016-01-24 DIAGNOSIS — L89130 Pressure ulcer of right lower back, unstageable: Secondary | ICD-10-CM | POA: Diagnosis not present

## 2016-01-24 DIAGNOSIS — Z8673 Personal history of transient ischemic attack (TIA), and cerebral infarction without residual deficits: Secondary | ICD-10-CM | POA: Diagnosis not present

## 2016-01-24 DIAGNOSIS — K219 Gastro-esophageal reflux disease without esophagitis: Secondary | ICD-10-CM | POA: Insufficient documentation

## 2016-01-24 DIAGNOSIS — S14103S Unspecified injury at C3 level of cervical spinal cord, sequela: Secondary | ICD-10-CM | POA: Diagnosis not present

## 2016-01-24 DIAGNOSIS — I2510 Atherosclerotic heart disease of native coronary artery without angina pectoris: Secondary | ICD-10-CM | POA: Diagnosis not present

## 2016-01-24 DIAGNOSIS — E119 Type 2 diabetes mellitus without complications: Secondary | ICD-10-CM | POA: Insufficient documentation

## 2016-01-24 DIAGNOSIS — G8250 Quadriplegia, unspecified: Secondary | ICD-10-CM | POA: Diagnosis not present

## 2016-01-24 DIAGNOSIS — Z7984 Long term (current) use of oral hypoglycemic drugs: Secondary | ICD-10-CM | POA: Insufficient documentation

## 2016-01-24 DIAGNOSIS — E785 Hyperlipidemia, unspecified: Secondary | ICD-10-CM | POA: Diagnosis not present

## 2016-01-24 DIAGNOSIS — G40909 Epilepsy, unspecified, not intractable, without status epilepticus: Secondary | ICD-10-CM | POA: Insufficient documentation

## 2016-01-24 DIAGNOSIS — F329 Major depressive disorder, single episode, unspecified: Secondary | ICD-10-CM | POA: Insufficient documentation

## 2016-01-24 DIAGNOSIS — L89112 Pressure ulcer of right upper back, stage 2: Secondary | ICD-10-CM | POA: Diagnosis not present

## 2016-01-25 NOTE — Progress Notes (Signed)
[name], [name] ([id number]) Visit Report for 01/24/2016 Allergy List Details Patient Name: [name], [name]. Date of Service: 01/24/2016 9:30 AM Medical Record Number: [id number] Patient Account Number: [id number] Date of Birth/Sex: 1950-06-26 (65 y.o. Male) Treating RN: Ahmed Prima Primary Care Physician: Otilio Miu Other Clinician: Referring Physician: Otilio Miu Treating Physician/Extender: Ricard Dillon Weeks in Treatment: 0 Allergies Active Allergies NKDA Allergy Notes Electronic Signature(s) Signed: 01/24/2016 5:55:17 PM By: Alric Quan Entered By: Alric Quan on 01/24/2016 09:52:09 Madia, Leslye Peer ([id number]) -------------------------------------------------------------------------------- Arrival Information Details Patient Name: [name]. Date of Service: 01/24/2016 9:30 AM Medical Record Number: [id number] Patient Account Number: [id number] Date of Birth/Sex: 12-06-1950 (65 y.o. Male) Treating RN: Ahmed Prima Primary Care Physician: Otilio Miu Other Clinician: Referring Physician: Otilio Miu Treating Physician/Extender: Tito Dine in Treatment: 0 Visit Information Patient Arrived: Wheel Chair Arrival Time: 09:29 Accompanied By: wife Transfer Assistance: None Patient Identification Verified: Yes Secondary Verification Process Yes Completed: Patient Requires Transmission-Based No Precautions: Patient Has Alerts: Yes Patient Alerts: DM II Plavix Electronic Signature(s) Signed: 01/24/2016 5:55:17 PM By: Alric Quan Entered By: Alric Quan on 01/24/2016 09:30:22 [place], Leslye Peer ([id number]) -------------------------------------------------------------------------------- Clinic Level of Care Assessment Details Patient Name: [name], Joon C. Date of Service: 01/24/2016 9:30 AM Medical Record Number: [id number] Patient Account Number: [id number] Date of Birth/Sex: 09-23-1950 (65 y.o.  Male) Treating RN: Ahmed Prima Primary Care Physician: Otilio Miu Other Clinician: Referring Physician: Otilio Miu Treating Physician/Extender: Ricard Dillon Weeks in Treatment: 0 Clinic Level of Care Assessment Items TOOL 1 Quantity Score X - Use when EandM and Procedure is performed on INITIAL visit 1 0 ASSESSMENTS - Nursing Assessment / Reassessment X - General Physical Exam (combine w/ comprehensive assessment (listed just 1 20 below) when performed on new pt. evals) X - Comprehensive Assessment (HX, ROS, Risk Assessments, Wounds Hx, etc.) 1 25 ASSESSMENTS - Wound and Skin Assessment / Reassessment [] - Dermatologic / Skin Assessment (not related to wound area) 0 ASSESSMENTS - Ostomy and/or Continence Assessment and Care [] - Incontinence Assessment and Management 0 [] - Ostomy Care Assessment and Management (repouching, etc.) 0 PROCESS - Coordination of Care [] - Simple Patient / Family Education for ongoing care 0 X - Complex (extensive) Patient / Family Education for ongoing care 1 20 X - Staff obtains Programmer, systems, Records, Test Results / Process Orders 1 10 [] - Staff telephones HHA, Nursing Homes / Clarify orders / etc 0 [] - Routine Transfer to another Facility (non-emergent condition) 0 [] - Routine Hospital Admission (non-emergent condition) 0 X - New Admissions / Biomedical engineer / Ordering NPWT, Apligraf, etc. 1 15 [] - Emergency Hospital Admission (emergent condition) 0 PROCESS - Special Needs [] - Pediatric / Minor Patient Management 0 [] - Isolation Patient Management 0 [place], Yonis C. ([id number]) [] - Hearing / Language / Visual special needs 0 [] - Assessment of Community assistance (transportation, D/C planning, etc.) 0 [] - Additional assistance / Altered mentation 0 X - Support Surface(s) Assessment (bed, cushion, seat, etc.) 1 15 INTERVENTIONS - Miscellaneous [] - External ear exam 0 [] - Patient Transfer (multiple staff / Civil Service fast streamer  / Similar devices) 0 [] - Simple Staple / Suture removal (25 or less) 0 [] - Complex Staple / Suture removal (26 or more) 0 [] - Hypo/Hyperglycemic Management (do not check if billed separately) 0 [] - Ankle / Brachial Index (ABI) - do not check if billed separately 0 Has the patient been seen at the  hospital within the last three years: Yes Total Score: 105 Level Of Care: New/Established - Level 3 Electronic Signature(s) Signed: 01/24/2016 5:55:17 PM By: Alric Quan Entered By: Alric Quan on 01/24/2016 17:46:34 [place], Leslye Peer ([id number]) -------------------------------------------------------------------------------- Encounter Discharge Information Details Patient Name: [name], [facility] C. Date of Service: 01/24/2016 9:30 AM Medical Record Number: [id number] Patient Account Number: [id number] Date of Birth/Sex: 01-24-1951 (65 y.o. Male) Treating RN: Ahmed Prima Primary Care Physician: Otilio Miu Other Clinician: Referring Physician: Otilio Miu Treating Physician/Extender: Tito Dine in Treatment: 0 Encounter Discharge Information Items Discharge Pain Level: 0 Discharge Condition: Stable Ambulatory Status: Wheelchair Discharge Destination: Home Transportation: Private Auto Accompanied By: wife Schedule Follow-up Appointment: Yes Medication Reconciliation completed and provided to Patient/Care No Shavona Gunderman: Provided on Clinical Summary of Care: 01/24/2016 Form Type Recipient Paper Patient [facility] Electronic Signature(s) Signed: 01/24/2016 10:36:43 AM By: Ruthine Dose Entered By: Ruthine Dose on 01/24/2016 10:36:43 Gorum, Leslye Peer ([id number]) -------------------------------------------------------------------------------- Lower Extremity Assessment Details Patient Name: [name], [name] Date of Service: 01/24/2016 9:30 AM Medical Record Number: [id number] Patient Account Number: [id number] Date of Birth/Sex: Feb 23, 1951 (65 y.o.  Male) Treating RN: Ahmed Prima Primary Care Physician: Otilio Miu Other Clinician: Referring Physician: Otilio Miu Treating Physician/Extender: Ricard Dillon Weeks in Treatment: 0 Electronic Signature(s) Signed: 01/24/2016 5:55:17 PM By: Alric Quan Entered By: Alric Quan on 01/24/2016 10:38:14 Wedge, Leslye Peer ([id number]) -------------------------------------------------------------------------------- Multi Wound Chart Details Patient Name: Harmening, Lucille C. Date of Service: 01/24/2016 9:30 AM Medical Record Number: [id number] Patient Account Number: [id number] Date of Birth/Sex: 24-Sep-1950 (65 y.o. Male) Treating RN: Ahmed Prima Primary Care Physician: Otilio Miu Other Clinician: Referring Physician: Otilio Miu Treating Physician/Extender: Ricard Dillon Weeks in Treatment: 0 Vital Signs Height(in): 70 Pulse(bpm): 87 Weight(lbs): 187 Blood Pressure 126/58 (mmHg): Body Mass Index(BMI): 27 Temperature(F): 97.9 Respiratory Rate 16 (breaths/min): Photos: [N/A:N/A] Wound Location: Right Back - Proximal N/A N/A Wounding Event: Pressure Injury N/A N/A Primary Etiology: Pressure Ulcer N/A N/A Comorbid History: Coronary Artery Disease, N/A N/A Type II Diabetes, Quadriplegia, Seizure Disorder Date Acquired: 11/24/2015 N/A N/A Weeks of Treatment: 0 N/A N/A Wound Status: Open N/A N/A Measurements L x W x D 3x2.5x0.1 N/A N/A (cm) Area (cm) : 5.89 N/A N/A Volume (cm) : 0.589 N/A N/A Classification: Category/Stage II N/A N/A Exudate Amount: Large N/A N/A Exudate Type: Serosanguineous N/A N/A Exudate Color: red, brown N/A N/A Wound Margin: Distinct, outline attached N/A N/A Granulation Amount: Small (1-33%) N/A N/A Granulation Quality: Red N/A N/A Necrotic Amount: Large (67-100%) N/A N/A Exposed Structures: N/A N/A SAMEUL, TAGLE. ([id number]) Fascia: No Fat: No Tendon: No Muscle: No Joint: No Bone: No Limited to  Skin Breakdown Epithelialization: None N/A N/A Periwound Skin Texture: No Abnormalities Noted N/A N/A Periwound Skin Moist: Yes N/A N/A Moisture: Periwound Skin Color: No Abnormalities Noted N/A N/A Temperature: No Abnormality N/A N/A Tenderness on Yes N/A N/A Palpation: Wound Preparation: Ulcer Cleansing: N/A N/A Rinsed/Irrigated with Saline Topical Anesthetic Applied: Other: LIDOCAINE 4% Treatment Notes Electronic Signature(s) Signed: 01/24/2016 5:55:17 PM By: Alric Quan Entered By: Alric Quan on 01/24/2016 10:00:42 Tobias, Leslye Peer ([id number]) -------------------------------------------------------------------------------- Multi-Disciplinary Care Plan Details Patient Name: EWEN, VARNELL C. Date of Service: 01/24/2016 9:30 AM Medical Record Number: [id number] Patient Account Number: [id number] Date of Birth/Sex: November 16, 1950 (65 y.o. Male) Treating RN: Ahmed Prima Primary Care Physician: Otilio Miu Other Clinician: Referring Physician: Otilio Miu Treating Physician/Extender: Tito Dine in Treatment: 0 Active Inactive Abuse / Safety / Falls / Self Care Management Nursing Diagnoses:  Potential for falls Goals: Patient will remain injury free Date Initiated: 01/24/2016 Goal Status: Active Interventions: Assess fall risk on admission and as needed Assess impairment of mobility on admission and as needed per policy Assess self care needs on admission and as needed Notes: Nutrition Nursing Diagnoses: Imbalanced nutrition Impaired glucose control: actual or potential Goals: Patient/caregiver agrees to and verbalizes understanding of need to use nutritional supplements and/or vitamins as prescribed Date Initiated: 01/24/2016 Goal Status: Active Patient/caregiver will maintain therapeutic glucose control Date Initiated: 01/24/2016 Goal Status: Active Interventions: Assess patient nutrition upon admission and as needed per  policy Provide education on elevated blood sugars and impact on wound healing Notes: Fusilier, Matis C. ([id number]) Orientation to the Wound Care Program Nursing Diagnoses: Knowledge deficit related to the wound healing center program Goals: Patient/caregiver will verbalize understanding of the Wound Healing Center Program Date Initiated: 01/24/2016 Goal Status: Active Interventions: Provide education on orientation to the wound center Notes: Pain, Acute or Chronic Nursing Diagnoses: Pain, acute or chronic: actual or potential Potential alteration in comfort, pain Goals: Patient will verbalize adequate pain control and receive pain control interventions during procedures as needed Date Initiated: 01/24/2016 Goal Status: Active Patient/caregiver will verbalize adequate pain control between visits Date Initiated: 01/24/2016 Goal Status: Active Patient/caregiver will verbalize comfort level met Date Initiated: 01/24/2016 Goal Status: Active Interventions: Assess comfort goal upon admission Complete pain assessment as per visit requirements Notes: Pressure Nursing Diagnoses: Knowledge deficit related to management of pressures ulcers Potential for impaired tissue integrity related to pressure, friction, moisture, and shear Goals: Mannis, Kincade C. ([id number]) Patient will remain free from development of additional pressure ulcers Date Initiated: 01/24/2016 Goal Status: Active Interventions: Assess: immobility, friction, shearing, incontinence upon admission and as needed Assess offloading mechanisms upon admission and as needed Assess potential for pressure ulcer upon admission and as needed Provide education on pressure ulcers Notes: Wound/Skin Impairment Nursing Diagnoses: Impaired tissue integrity Goals: Ulcer/skin breakdown will have a volume reduction of 30% by week 4 Date Initiated: 01/24/2016 Goal Status: Active Ulcer/skin breakdown will have a volume  reduction of 50% by week 8 Date Initiated: 01/24/2016 Goal Status: Active Ulcer/skin breakdown will have a volume reduction of 80% by week 12 Date Initiated: 01/24/2016 Goal Status: Active Interventions: Assess patient/caregiver ability to perform ulcer/skin care regimen upon admission and as needed Assess ulceration(s) every visit Notes: Electronic Signature(s) Signed: 01/24/2016 5:55:17 PM By: Pinkerton, Debra Entered By: Pinkerton, Debra on 01/24/2016 10:00:18 Gassen, Carmel C. ([id number]) -------------------------------------------------------------------------------- Pain Assessment Details Patient Name: Jablon, Kahlel C. Date of Service: 01/24/2016 9:30 AM Medical Record Number: [id number] Patient Account Number: [id number] Date of Birth/Sex: 12/20/1950 (65 y.o. Male) Treating RN: Pinkerton, Debi Primary Care Physician: Jones, Deanna Other Clinician: Referring Physician: Jones, Deanna Treating Physician/Extender: ROBSON, MICHAEL G Weeks in Treatment: 0 Active Problems Location of Pain Severity and Description of Pain Patient Has Paino Yes Site Locations Pain Location: Pain in Ulcers With Dressing Change: Yes Rate the pain. Current Pain Level: 5 Character of Pain Describe the Pain: Burning, Tender, Throbbing Pain Management and Medication Current Pain Management: Electronic Signature(s) Signed: 01/24/2016 5:55:17 PM By: Pinkerton, Debra Entered By: Pinkerton, Debra on 01/24/2016 09:31:20 Nealy, Lorene C. ([id number]) -------------------------------------------------------------------------------- Patient/Caregiver Education Details Patient Name: Kuennen, Delmore C. Date of Service: 01/24/2016 9:30 AM Medical Record Number: [id number] Patient Account Number: [id number] Date of Birth/Gender: 11/15/1950 (65 y.o. Male) Treating RN: Pinkerton, Debi Primary Care Physician: Jones, Deanna Other Clinician: Referring Physician: Jones, Deanna Treating Physician/Extender:  ROBSON, MICHAEL G Weeks in Treatment:   0 Education Assessment Education Provided To: Patient and Caregiver Education Topics Provided Elevated Blood Sugar/ Impact on Healing: Handouts: Elevated Blood Sugars: How Do They Affect Wound Healing Methods: Explain/Verbal Responses: State content correctly Pressure: Handouts: Pressure Ulcers: Care and Offloading, Preventing Pressure Ulcers Methods: Explain/Verbal Responses: State content correctly Welcome To The Wound Care Center: Handouts: Welcome To The Wound Care Center Methods: Explain/Verbal Responses: State content correctly Wound/Skin Impairment: Handouts: Other: change dressing as ordered Methods: Demonstration, Explain/Verbal Responses: State content correctly Electronic Signature(s) Signed: 01/24/2016 5:55:17 PM By: Pinkerton, Debra Entered By: Pinkerton, Debra on 01/24/2016 10:03:41 Solar, Aloysius C. ([id number]) -------------------------------------------------------------------------------- Wound Assessment Details Patient Name: Corning, Jahbari C. Date of Service: 01/24/2016 9:30 AM Medical Record Number: [id number] Patient Account Number: [id number] Date of Birth/Sex: 09/28/1950 (65 y.o. Male) Treating RN: Pinkerton, Debi Primary Care Physician: Jones, Deanna Other Clinician: Referring Physician: Jones, Deanna Treating Physician/Extender: ROBSON, MICHAEL G Weeks in Treatment: 0 Wound Status Wound Number: 1 Primary Pressure Ulcer Etiology: Wound Location: Right, Proximal Back Wound Open Wounding Event: Pressure Injury Status: Date Acquired: 11/24/2015 Comorbid Coronary Artery Disease, Type II Weeks Of Treatment: 0 History: Diabetes, Quadriplegia, Seizure Clustered Wound: No Disorder Photos Photo Uploaded By: Pinkerton, Debra on 01/24/2016 09:55:33 Wound Measurements Length: (cm) 4.1 Width: (cm) 2.5 Depth: (cm) 0.1 Area: (cm²) 8.05 Volume: (cm³) 0.805 % Reduction in Area: -2.5% % Reduction in Volume:  -2.5% Epithelialization: None Tunneling: No Undermining: No Wound Description Classification: Category/Stage II Wound Margin: Distinct, outline attached Exudate Amount: Large Exudate Type: Serosanguineous Exudate Color: red, brown Foul Odor After Cleansing: No Wound Bed Granulation Amount: Small (1-33%) Exposed Structure Granulation Quality: Red Fascia Exposed: No Necrotic Amount: Large (67-100%) Fat Layer Exposed: No Necrotic Quality: Adherent Slough Tendon Exposed: No Mongiello, Zakary C. ([id number]) Muscle Exposed: No Joint Exposed: No Bone Exposed: No Limited to Skin Breakdown Periwound Skin Texture Texture Color No Abnormalities Noted: No No Abnormalities Noted: No Moisture Temperature / Pain No Abnormalities Noted: No Temperature: No Abnormality Moist: Yes Tenderness on Palpation: Yes Wound Preparation Ulcer Cleansing: Rinsed/Irrigated with Saline Topical Anesthetic Applied: Other: LIDOCAINE 4%, Treatment Notes Wound #1 (Right, Proximal Back) 1. Cleansed with: Clean wound with Normal Saline 2. Anesthetic Topical Lidocaine 4% cream to wound bed prior to debridement 3. Peri-wound Care: Skin Prep 4. Dressing Applied: Santyl Ointment 5. Secondary Dressing Applied Bordered Foam Dressing Dry Gauze Electronic Signature(s) Signed: 01/24/2016 5:55:17 PM By: Pinkerton, Debra Entered By: Pinkerton, Debra on 01/24/2016 10:39:14 Tones, Thierno C. ([id number]) -------------------------------------------------------------------------------- Vitals Details Patient Name: Esker, Stylianos C. Date of Service: 01/24/2016 9:30 AM Medical Record Number: [id number] Patient Account Number: [id number] Date of Birth/Sex: 08/02/1950 (65 y.o. Male) Treating RN: Pinkerton, Debi Primary Care Physician: Jones, Deanna Other Clinician: Referring Physician: Jones, Deanna Treating Physician/Extender: ROBSON, MICHAEL G Weeks in Treatment: 0 Vital Signs Time Taken: 09:31 Temperature  (°F): 97.9 Height (in): 70 Pulse (bpm): 87 Source: Stated Respiratory Rate (breaths/min): 16 Weight (lbs): 187 Blood Pressure (mmHg): 126/58 Source: Stated Reference Range: 80 - 120 mg / dl Body Mass Index (BMI): 26.8 Electronic Signature(s) Signed: 01/24/2016 5:55:17 PM By: Pinkerton, Debra Entered By: Pinkerton, Debra on 01/24/2016 09:34:36 

## 2016-01-25 NOTE — Progress Notes (Signed)
[name], [name] ([id number]) Visit Report for 01/24/2016 Abuse/Suicide Risk Screen Details Patient Name: [name], [name] Date of Service: 01/24/2016 9:30 AM Medical Record Patient Account Number: [id number] Number: Treating RN: Phillis Haggisinkerton, Debi Apr 25, 1950 (65 y.o. Other Clinician: Date of Birth/Sex: Male) Treating ROBSON, MICHAEL Primary Care Physician: Elizabeth SauerJones, Deanna Physician/Extender: G Referring Physician: Elizabeth SauerJones, Deanna Weeks in Treatment: 0 Abuse/Suicide Risk Screen Items Answer ABUSE/SUICIDE RISK SCREEN: Has anyone close to you tried to hurt or harm you recentlyo No Do you feel uncomfortable with anyone in your familyo No Has anyone forced you do things that you didnot want to doo No Do you have any thoughts of harming yourselfo No Patient displays signs or symptoms of abuse and/or neglect. No Electronic Signature(s) Signed: 01/24/2016 5:55:17 PM By: Alejandro MullingPinkerton, Debra Entered By: Alejandro MullingPinkerton, Debra on 01/24/2016 09:43:27 Divelbiss, [name] ([id number]) -------------------------------------------------------------------------------- Activities of Daily Living Details Patient Name: [name], [name] Date of Service: 01/24/2016 9:30 AM Medical Record Patient Account Number: [id number] Number: Treating RN: Phillis Haggisinkerton, Debi Apr 25, 1950 (65 y.o. Other Clinician: Date of Birth/Sex: Male) Treating ROBSON, MICHAEL Primary Care Physician: Elizabeth SauerJones, Deanna Physician/Extender: G Referring Physician: Elizabeth SauerJones, Deanna Weeks in Treatment: 0 Activities of Daily Living Items Answer Activities of Daily Living (Please select one for each item) Drive Automobile Not Able Take Medications Not Able Use Telephone Not Able Care for Appearance Not Able Use Toilet Not Able Mady HaagensenBath / Shower Not Able Dress Self Not Able Feed Self Not Able Walk Not Able Get In / Out Bed Not Able Housework Not Able Prepare Meals Not Able Handle Money Not Able Shop for Self Not Able Electronic  Signature(s) Signed: 01/24/2016 5:55:17 PM By: Alejandro MullingPinkerton, Debra Entered By: Alejandro MullingPinkerton, Debra on 01/24/2016 09:43:50 Mino, [name] ([id number]) -------------------------------------------------------------------------------- Education Assessment Details Patient Name: [name] BrasHATCH, [name] Date of Service: 01/24/2016 9:30 AM Medical Record Patient Account Number: [id number] Number: Treating RN: Phillis Haggisinkerton, Debi Apr 25, 1950 (65 y.o. Other Clinician: Date of Birth/Sex: Male) Treating ROBSON, MICHAEL Primary Care Physician: Elizabeth SauerJones, Deanna Physician/Extender: G Referring Physician: Sherrian DiversJones, Deanna Weeks in Treatment: 0 Primary Learner Assessed: Patient Learning Preferences/Education Level/Primary Language Learning Preference: Explanation Highest Education Level: High School Preferred Language: English Cognitive Barrier Assessment/Beliefs Language Barrier: No Translator Needed: No Memory Deficit: No Emotional Barrier: No Cultural/Religious Beliefs Affecting Medical No Care: Physical Barrier Assessment Impaired Vision: No Impaired Hearing: No Decreased Hand dexterity: No Knowledge/Comprehension Assessment Knowledge Level: High Comprehension Level: High Ability to understand written High instructions: Ability to understand verbal High instructions: Motivation Assessment Anxiety Level: Calm Cooperation: Cooperative Education Importance: Acknowledges Need Interest in Health Problems: Asks Questions Perception: Coherent Willingness to Engage in Self- High Management Activities: Readiness to Engage in Self- High Management Activities: [name], [name] ([id number]) Electronic Signature(s) Signed: 01/24/2016 5:55:17 PM By: Alejandro MullingPinkerton, Debra Entered By: Alejandro MullingPinkerton, Debra on 01/24/2016 09:44:12 Gilcrease, [name] ([id number]) -------------------------------------------------------------------------------- Fall Risk Assessment Details Patient Name: [name], [name] Date  of Service: 01/24/2016 9:30 AM Medical Record Patient Account Number: [id number] Number: Treating RN: Phillis Haggisinkerton, Debi Apr 25, 1950 (65 y.o. Other Clinician: Date of Birth/Sex: Male) Treating ROBSON, MICHAEL Primary Care Physician: Elizabeth SauerJones, Deanna Physician/Extender: G Referring Physician: Elizabeth SauerJones, Deanna Weeks in Treatment: 0 Fall Risk Assessment Items Have you had 2 or more falls in the last 12 monthso 0 Yes Have you had any fall that resulted in injury in the last 12 monthso 0 No FALL RISK ASSESSMENT: History of falling - immediate or within 3 months 0 No Secondary diagnosis 15 Yes Ambulatory aid None/bed rest/wheelchair/nurse 0 Yes Crutches/cane/walker 0  No Furniture 0 No IV Access/Saline Lock 0 No Gait/Training Normal/bed rest/immobile 0 No Weak 10 Yes Impaired 20 Yes Mental Status Oriented to own ability 0 No Electronic Signature(s) Signed: 01/24/2016 5:55:17 PM By: Alejandro MullingPinkerton, Debra Entered By: Alejandro MullingPinkerton, Debra on 01/24/2016 09:44:41 Carrick, [name] ([id number]) -------------------------------------------------------------------------------- Foot Assessment Details Patient Name: Buxbaum, [name] Date of Service: 01/24/2016 9:30 AM Medical Record Patient Account Number: [id number] Number: Treating RN: Phillis Haggisinkerton, Debi November 26, 1950 (65 y.o. Other Clinician: Date of Birth/Sex: Male) Treating ROBSON, MICHAEL Primary Care Physician: Elizabeth SauerJones, Deanna Physician/Extender: G Referring Physician: Elizabeth SauerJones, Deanna Weeks in Treatment: 0 Foot Assessment Items Site Locations + = Sensation present, - = Sensation absent, C = Callus, U = Ulcer R = Redness, W = Warmth, M = Maceration, PU = Pre-ulcerative lesion F = Fissure, S = Swelling, D = Dryness Assessment Right: Left: Other Deformity: No No Prior Foot Ulcer: No No Prior Amputation: No No Charcot Joint: No No Ambulatory Status: Gait: Electronic Signature(s) Signed: 01/24/2016 5:55:17 PM By: Alejandro MullingPinkerton,  Debra Entered By: Alejandro MullingPinkerton, Debra on 01/24/2016 09:45:28 Speas, [name] ([id number]) -------------------------------------------------------------------------------- Nutrition Risk Assessment Details Patient Name: Werber, Juan C. Date of Service: 01/24/2016 9:30 AM Medical Record Patient Account Number: [id number] Number: Treating RN: Phillis Haggisinkerton, Debi November 26, 1950 (65 y.o. Other Clinician: Date of Birth/Sex: Male) Treating ROBSON, MICHAEL Primary Care Physician: Elizabeth SauerJones, Deanna Physician/Extender: G Referring Physician: Elizabeth SauerJones, Deanna Weeks in Treatment: 0 Height (in): 70 Weight (lbs): 187 Body Mass Index (BMI): 26.8 Nutrition Risk Assessment Items NUTRITION RISK SCREEN: I have an illness or condition that made me change the kind and/or 2 Yes amount of food I eat I eat fewer than two meals per day 3 Yes I eat few fruits and vegetables, or milk products 0 No I have three or more drinks of beer, liquor or wine almost every day 0 No I have tooth or mouth problems that make it hard for me to eat 0 No I don't always have enough money to buy the food I need 0 No I eat alone most of the time 0 No I take three or more different prescribed or over-the-counter drugs a 1 Yes day Without wanting to, I have lost or gained 10 pounds in the last six 2 Yes months I am not always physically able to shop, cook and/or feed myself 0 No Nutrition Protocols Good Risk Protocol Moderate Risk Protocol Electronic Signature(s) Signed: 01/24/2016 5:55:17 PM By: Alejandro MullingPinkerton, Debra Entered By: Alejandro MullingPinkerton, Debra on 01/24/2016 09:45:22

## 2016-01-26 NOTE — Progress Notes (Signed)
[name], [name] ([id number]) Visit Report for 01/24/2016 Chief Complaint Document Details Patient Name: [name], [name] Date of Service: 01/24/2016 9:30 AM Medical Record Patient Account Number: [id number] Number: Treating RN: Phillis [name], [name] [birth year] (65 y.o. Other Clinician: Date of Birth/Sex: Male) Treating [name], [name] Primary Care Physician: [name], [name] Physician/Extender: [name] Referring Physician: Elizabeth [name], [name] Weeks in Treatment: 0 Information Obtained from: Patient Chief Complaint 01/24/16 patient arrives today for review of a pressure ulcer on his right scapula in the setting of incomplete C3-C4 quadriplegia Electronic Signature(s) Signed: 01/24/2016 6:11:07 PM By: Baltazar Najjarobson, Michael MD Entered By: Baltazar Najjarobson, [name] on 01/24/2016 17:57:47 [name], [name] BienICHARD C. ([id number]) -------------------------------------------------------------------------------- Debridement Details Patient Name: [name], [name] Date of Service: 01/24/2016 9:30 AM Medical Record Patient Account Number: [id number] Number: Treating RN: Phillis [name], [name] [birth year] (65 y.o. Other Clinician: Date of Birth/Sex: Male) Treating [name], [name] Primary Care Physician: [name], [name] Physician/Extender: [name] Referring Physician: Elizabeth [name], [name] Weeks in Treatment: 0 Debridement Performed for Wound #1 Right,Proximal Back Assessment: Performed By: Physician [name], [name] G, MD Debridement: Debridement Pre-procedure Yes - 10:17 Verification/Time Out Taken: Start Time: 10:18 Pain Control: Lidocaine 4% Topical Solution Level: Skin/Subcutaneous Tissue Total Area Debrided (L x 4.1 (cm) x 2.5 (cm) = 10.25 (cm) W): Tissue and other Viable, Non-Viable, Exudate, Fibrin/Slough, Subcutaneous material debrided: Instrument: Curette Bleeding: Minimum Hemostasis Achieved: Pressure End Time: 10:21 Procedural Pain: 0 Post Procedural Pain: 0 Response to Treatment: Procedure was tolerated  well Post Debridement Measurements of Total Wound Length: (cm) 4.1 Stage: Category/Stage II Width: (cm) 2.5 Depth: (cm) 0.1 Volume: (cm) 0.805 Character of Wound/Ulcer Post Requires Further Debridement: Debridement Severity of Tissue Post Fat layer exposed Debridement: Post Procedure Diagnosis Same as Pre-procedure Electronic Signature(s) Signed: 01/24/2016 6:11:07 PM By: Baltazar Najjarobson, Michael MD [name], [name] ([id number]) Signed: 01/25/2016 4:47:20 PM By: [name], [name] Previous Signature: 01/24/2016 5:55:17 PM Version By: [name], [name] Entered By: Baltazar Najjarobson, [name] on 01/24/2016 17:57:14 [name], [name] BienICHARD C. ([id number]) -------------------------------------------------------------------------------- HPI Details Patient Name: [name], [name] Date of Service: 01/24/2016 9:30 AM Medical Record Patient Account Number: [id number] Number: Treating RN: Phillis [name], [name] [birth year] (65 y.o. 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 He has lower extremity sensation and can move his legs but has no/limited control over his arms. His wife accompanies him today and states that in the late spring or early summer of 2017 the patient became very depressed. He refused the refused to mobilize and he developed several pressure ulcers on his back. Most of these have healed however they have a recalcitrant area over the right scapula. They've been using Santyl on this for at least the last month. In terms of depression the patient is doing better now on an antidepressant. He saw his primary physician on 01/12/16 at which time there was apparently green drainage  coming out of this area [Dr. Deana Jones]. Dr. Yetta BarreJones works in [facility]. He has completed this Septra. Otherwise [facility] that he has a history of seizures. He also had a stroke in 2008 he follows with neurology. He also has type 2 diabetes on Glucophage, hyperlipidemia and gastroesophageal reflux. He takes Plavix for stroke prevention and Keppra for seizure prophylaxis. 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 Electronic Signature(s) Signed: 01/24/2016 6:11:07 PM By: Baltazar Najjarobson, Michael MD Entered By: Baltazar Najjarobson, [name] on 01/24/2016 18:02:46 Anzalone, [name] BienICHARD C. ([id number]) -------------------------------------------------------------------------------- Physical Exam Details Patient Name: [name], [name] Date of Service: 01/24/2016 9:30 AM Medical Record Patient Account Number: [id number] Number: Treating RN: Phillis [name], [name] 1950/10/29 (65 y.o. Other Clinician: Date of Birth/Sex: Male) Treating [name], [name] Primary Care Physician: [name], [name] Physician/Extender: [name] Referring Physician: Elizabeth [name], [name] Weeks in Treatment: 0 Constitutional Sitting or standing Blood Pressure is within target range for patient.. Pulse regular and within target range for patient.Marland Kitchen. Respirations regular, non-labored and within target range.. Temperature is normal and within the target range for the patient.. Patient's appearance is neat and clean. Appears in no acute distress. Well nourished and well developed.. Eyes Conjunctivae clear. No discharge.. Ears, Nose, Mouth, and Throat Oropharynx within normal limits, without erythema, exudate or ulceration.Marland Kitchen. Respiratory Respiratory effort is easy and symmetric bilaterally. Rate is normal at rest and on room air.. Bilateral breath sounds are clear and equal in all lobes with no wheezes, rales or rhonchi.. Cardiovascular Heart  rhythm and rate regular, without murmur or gallop.. Gastrointestinal (GI) Abdomen is soft and non-distended without masses or tenderness. Bowel sounds active in all quadrants.. Neurological Although the patient's neurologic exam fits with the advertise lesion at C1-C4 he does have sensation in the periwound area.Marland Kitchen. Psychiatric Patient appears depressed today. According to his wife this is an actual improvement.. Notes Wound exam; the area in question is over the right scapula. This is a stage III wound with exposed muscle but no palpable bone. There is no evidence of infection. On arrival there was a tightly adherent slough and nonviable subcutaneous tissue. This required debridement with a #3 curette. He tolerated this well hemostasis with direct pressure Electronic Signature(s) Signed: 01/24/2016 6:11:07 PM By: Baltazar Najjarobson, Michael MD Entered By: Baltazar Najjarobson, [name] on 01/24/2016 18:07:45 Vavra, [name] BienICHARD C. ([id number]) -------------------------------------------------------------------------------- Physician Orders Details Patient Name: [name], Damario C. Date of Service: 01/24/2016 9:30 AM Medical Record Patient Account Number: [id number] Number: Treating RN: Phillis [name], [name] 1950/10/29 (65 y.o. Other Clinician: Date of Birth/Sex: Male) Treating [name], [name] Primary Care Physician: [name], [name] Physician/Extender: [name] Referring Physician: Sherrian DiversJones, [name] Weeks in Treatment: 0 Verbal / Phone Orders: Yes Clinician: Ashok CordiaPinkerton, [name] Read Back and Verified: Yes Diagnosis Coding Wound Cleansing Wound #1 Right,Proximal Back o Clean wound with Normal Saline. o Cleanse wound with mild soap and water Anesthetic Wound #1 Right,Proximal Back o Topical Lidocaine 4% cream applied to wound bed prior to debridement - CLINIC USE Skin Barriers/Peri-Wound Care Wound #1 Right,Proximal Back o Skin Prep Primary Wound Dressing Wound #1 Right,Proximal Back o Santyl Ointment Secondary  Dressing Wound #1 Right,Proximal Back o Dry Gauze o Boardered Foam Dressing Dressing Change Frequency Wound #1 Right,Proximal Back o Change dressing every other day. Follow-up Appointments Wound #1 Right,Proximal Back o Return Appointment in 1 week. Off-Loading Wound #1 Right,Proximal Back o Mattress - ORDER MATTRESS OR MATTRESS COVER .Marland Kitchen.Marland Kitchen.MEDICAL MODALITIES Monjaraz, [name] BienRICHARD C. ([id number]) o Turn and reposition every 2 hours o Other: - Pad for wheelchair for positioning Additional Orders / Instructions Wound #1 Right,Proximal Back o Increase protein intake. Medications-please add to medication list. Wound #1 Right,Proximal Back o Santyl Enzymatic Ointment o Other: - VITAMIN C, ZINC Devices o Bed- Mattress-Overlay or Replacement - MEDICAL MODALITIES o Wheelchair Cushion - Pad or cushion for wheelchair for repositioning Electronic Signature(s) Signed: 01/24/2016 5:55:17 PM  By: [name] Mulling Signed: 01/24/2016 6:11:07 PM By: Baltazar Najjar MD Entered By: [name] Mulling on 01/24/2016 10:45:56 [name], [name] Bien ([id number]) -------------------------------------------------------------------------------- Problem List Details Patient Name: CLETO, CLAGGETT C. Date of Service: 01/24/2016 9:30 AM Medical Record Patient Account Number: [id number] Number: Treating RN: Phillis Haggis 28-Nov-1950 (65 y.o. Other Clinician: Date of Birth/Sex: Male) Treating [name], [name] Primary Care Physician: [name] Sauer Physician/Extender: [name] Referring Physician: Elizabeth Sauer Weeks in Treatment: 0 Active Problems ICD-10 Encounter Code Description Active Date Diagnosis 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, stage 3 01/24/2016 Yes S14.103S Unspecified injury at C3 level of cervical spinal cord, 01/24/2016 Yes sequela Inactive Problems Resolved Problems Electronic Signature(s) Signed: 01/24/2016 6:11:07 PM By: Baltazar Najjar MD Entered By: Baltazar Najjar  on 01/24/2016 17:56:59 Kotowski, [name] Bien ([id number]) -------------------------------------------------------------------------------- Progress Note Details Patient Name: [name] C. Date of Service: 01/24/2016 9:30 AM Medical Record Patient Account Number: [id number] Number: Treating RN: Phillis Haggis 1950-05-15 (65 y.o. 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 He has lower extremity sensation and can move his legs but has no/limited control over his arms. His wife accompanies him today and states that in the late spring or early summer of 2017 the patient became very depressed. He refused the refused to mobilize and he developed several pressure ulcers on his back. Most of these have healed however they have a recalcitrant area over the right scapula. They've been using Santyl on this for at least the last month. In terms of depression the patient is doing better now on an antidepressant. He saw his primary physician on 01/12/16 at which time there was apparently green drainage coming out of this area [Dr. Deana Jones]. Dr. Yetta Barre works in the [facility]. He has completed this Septra. Otherwise [facility] that he has a history of seizures. He also had a stroke in 2008 he follows with neurology. He also has type 2 diabetes on Glucophage, hyperlipidemia and gastroesophageal reflux. He takes Plavix for stroke  prevention and Keppra for seizure prophylaxis. 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 Wound History Patient presents with 1 open wound that has been present for approximately 2 months. Patient has been treating wound in the following manner: santyl. Laboratory tests have not been performed in the last month. Patient reportedly has not tested positive for an antibiotic resistant organism. Patient reportedly has not tested positive for osteomyelitis. Patient reportedly has not had testing performed to evaluate circulation in the legs. Patient experiences the following problems associated with their wounds: swelling. Patient History Information obtained from Patient. Allergies NKDA [name], [name] ([id number]) Family History Heart Disease - Mother, Paternal Grandparents, Hypertension - Father, Paternal Grandparents, No family history of Cancer, Diabetes, Hereditary Spherocytosis, Kidney Disease, Lung Disease, Seizures, Stroke, Thyroid Problems, Tuberculosis. Social History Never smoker, Marital Status - Married, Alcohol Use - Never, Drug Use - No History, Caffeine Use - Daily. Medical History Cardiovascular Patient has history of Coronary Artery Disease Endocrine  Patient has history of Type II Diabetes Neurologic Patient has history of Quadriplegia, Seizure Disorder Patient is treated with Oral Agents. Blood sugar is not tested. Review of Systems (ROS) Constitutional Symptoms (General Health) The patient has no complaints or symptoms. Eyes The patient has no complaints or symptoms. Ear/Nose/Mouth/Throat The patient has no complaints or symptoms. Hematologic/Lymphatic The patient has no complaints or symptoms. Respiratory The patient has no complaints or symptoms. Cardiovascular stroke hyperlipidemia Gastrointestinal GERD Immunological The patient has no complaints or symptoms. Integumentary  (Skin) Complains or has symptoms of Wounds. Musculoskeletal The patient has no complaints or symptoms. Neurologic SPINAL CORD DISEASE Oncologic The patient has no complaints or symptoms. Psychiatric Complains or has symptoms of Anxiety. Objective Dollins, Ashly C. ([id number]) Constitutional Sitting or standing Blood Pressure is within target range for patient.. Pulse regular and within target range for patient.Marland Kitchen Respirations regular, non-labored and within target range.. Temperature is normal and within the target range for the patient.. Patient's appearance is neat and clean. Appears in no acute distress. Well nourished and well developed.. Vitals Time Taken: 9:31 AM, Height: 70 in, Source: Stated, Weight: 187 lbs, Source: Stated, BMI: 26.8, Temperature: 97.9 F, Pulse: 87 bpm, Respiratory Rate: 16 breaths/min, Blood Pressure: 126/58 mmHg. Eyes Conjunctivae clear. No discharge.. Ears, Nose, Mouth, and Throat Oropharynx within normal limits, without erythema, exudate or ulceration.Marland Kitchen Respiratory Respiratory effort is easy and symmetric bilaterally. Rate is normal at rest and on room air.. Bilateral breath sounds are clear and equal in all lobes with no wheezes, rales or rhonchi.. Cardiovascular Heart rhythm and rate regular, without murmur or gallop.. Gastrointestinal (GI) Abdomen is soft and non-distended without masses or tenderness. Bowel sounds active in all quadrants.. Neurological Although the patient's neurologic exam fits with the advertise lesion at C1-C4 he does have sensation in the periwound area.Marland Kitchen Psychiatric Patient appears depressed today. According to his wife this is an actual improvement.. General Notes: Wound exam; the area in question is over the right scapula. This is a stage III wound with exposed muscle but no palpable bone. There is no evidence of infection. On arrival there was a tightly adherent slough and nonviable subcutaneous tissue. This required  debridement with a #3 curette. He tolerated this well hemostasis with direct pressure Integumentary (Hair, Skin) Wound #1 status is Open. Original cause of wound was Pressure Injury. The wound is located on the Right,Proximal Back. The wound measures 4.1cm length x 2.5cm width x 0.1cm depth; 8.05cm^2 area and 0.805cm^3 volume. The wound is limited to skin breakdown. There is no tunneling or undermining noted. There is a large amount of serosanguineous drainage noted. The wound margin is distinct with the outline attached to the wound base. There is small (1-33%) red granulation within the wound bed. There is a large (67-100%) amount of necrotic tissue within the wound bed including Adherent Slough. The periwound skin appearance exhibited: Moist. Periwound temperature was noted as No Abnormality. The periwound has tenderness on palpation. ROLLAN, ROGER ([id number]) Assessment Active Problems ICD-10 [phone] - Pressure ulcer of other site, stage 3 S14.103S - Unspecified injury at C3 level of cervical spinal cord, sequela Procedures Wound #1 Wound #1 is a Pressure Ulcer located on the Right,Proximal Back . There was a Skin/Subcutaneous Tissue Debridement ([id number]) debridement with total area of 10.25 sq cm performed by [name] Caul, MD. with the following instrument(s): Curette to remove Viable and Non-Viable tissue/material including Exudate, Fibrin/Slough, and Subcutaneous after achieving pain control using Lidocaine 4% Topical Solution. A time out was  conducted at 10:17, prior to the start of the procedure. A Minimum amount of bleeding was controlled with Pressure. The procedure was tolerated well with a pain level of 0 throughout and a pain level of 0 following the procedure. Post Debridement Measurements: 4.1cm length x 2.5cm width x 0.1cm depth; 0.805cm^3 volume. Post debridement Stage noted as Category/Stage II. Character of Wound/Ulcer Post Debridement requires further  debridement. Severity of Tissue Post Debridement is: Fat layer exposed. Post procedure Diagnosis Wound #1: Same as Pre-Procedure Plan Wound Cleansing: Wound #1 Right,Proximal Back: Clean wound with Normal Saline. Cleanse wound with mild soap and water Anesthetic: Wound #1 Right,Proximal Back: Topical Lidocaine 4% cream applied to wound bed prior to debridement - CLINIC USE Skin Barriers/Peri-Wound Care: Wound #1 Right,Proximal Back: Skin Prep Primary Wound Dressing: Wound #1 Right,Proximal Back: Bralley, Wendal C. ([id number]) Santyl Ointment Secondary Dressing: Wound #1 Right,Proximal Back: Dry Gauze Boardered Foam Dressing Dressing Change Frequency: Wound #1 Right,Proximal Back: Change dressing every other day. Follow-up Appointments: Wound #1 Right,Proximal Back: Return Appointment in 1 week. Off-Loading: Wound #1 Right,Proximal Back: Mattress - ORDER MATTRESS OR MATTRESS COVER .Marland KitchenMarland KitchenMEDICAL MODALITIES Turn and reposition every 2 hours Other: - Pad for wheelchair for positioning Additional Orders / Instructions: Wound #1 Right,Proximal Back: Increase protein intake. Medications-please add to medication list.: Wound #1 Right,Proximal Back: Santyl Enzymatic Ointment Other: - VITAMIN C, ZINC Devices ordered were: Bed- Mattress-Overlay or Replacement - MEDICAL MODALITIES, Wheelchair Cushion - Pad or cushion for wheelchair for repositioning #1 I think they've been doing a good job with this wound at home. I think Santyl is the correct treatment at this point which is covered with a border foam dressing change every second day. #2 he has a hospital bed at home but no pressure-relief mattress. Will have medical modalities go out to the home and see if he would be eligible for a level to surface. #3 the pressure area in question might have a lot to do with the patient's wheelchair, the back of which ends at this level. He uses his head to control the electric chair, therefore  there is not a lot of options for offloading this himself. We will also see if he is eligible for a specialized cushion in this area #4 I see no evidence of an infection here. Although historically there was green drainage here I see none of it. No antibiotics are required Electronic Signature(s) Signed: 01/25/2016 1:47:11 PM By: Elliot Gurney RN, BSN, Kim RN, BSN Signed: 01/25/2016 4:55:30 PM By: Baltazar Najjar MD Previous Signature: 01/24/2016 6:11:07 PM Version By: Baltazar Najjar MD AHMON, TOSI ([id number]) Entered By: Elliot Gurney RN, BSN, Kim on 01/25/2016 13:47:10 Dimario, [name] Bien ([id number]) -------------------------------------------------------------------------------- ROS/PFSH Details Patient Name: JADD, GASIOR. Date of Service: 01/24/2016 9:30 AM Medical Record Patient Account Number: [id number] Number: Treating RN: Phillis Haggis 16-Jul-1950 (65 y.o. Other Clinician: Date of Birth/Sex: Male) Treating [name], [name] Primary Care Physician: [name] Sauer Physician/Extender: [name] Referring Physician: Elizabeth Sauer Weeks in Treatment: 0 Information Obtained From Patient Wound History Do you currently have one or more open woundso Yes How many open wounds do you currently haveo 1 Approximately how long have you had your woundso 2 months How have you been treating your wound(s) until nowo santyl Has your wound(s) ever healed and then re-openedo No Have you had any lab work done in the past montho No Have you tested positive for an antibiotic resistant organism (MRSA, VRE)o No Have you tested positive for osteomyelitis (bone infection)o No Have you  had any tests for circulation on your legso No Have you had other problems associated with your woundso Swelling Integumentary (Skin) Complaints and Symptoms: Positive for: Wounds Psychiatric Complaints and Symptoms: Positive for: Anxiety Constitutional Symptoms (General Health) Complaints and Symptoms: No  Complaints or Symptoms Eyes Complaints and Symptoms: No Complaints or Symptoms Ear/Nose/Mouth/Throat Complaints and Symptoms: No Complaints or Symptoms Hematologic/Lymphatic ADISON, JERGER ([id number]) Complaints and Symptoms: No Complaints or Symptoms Respiratory Complaints and Symptoms: No Complaints or Symptoms Cardiovascular Complaints and Symptoms: Review of System Notes: stroke hyperlipidemia Medical History: Positive for: Coronary Artery Disease Gastrointestinal Complaints and Symptoms: Review of System Notes: GERD Endocrine Medical History: Positive for: Type II Diabetes Time with diabetes: 2 YEARS Treated with: Oral agents Blood sugar tested every day: No Immunological Complaints and Symptoms: No Complaints or Symptoms Musculoskeletal Complaints and Symptoms: No Complaints or Symptoms Neurologic Complaints and Symptoms: Review of System Notes: SPINAL CORD DISEASE Medical History: Positive for: Quadriplegia; Seizure Disorder Bl[place]ame, AUTHER LYERLY ([id number]) Oncologic Complaints and Symptoms: No Complaints or Symptoms Immunizations Pneumococcal Vaccine: Received Pneumococcal Vaccination: Yes Family and Social History Cancer: No; Diabetes: No; Heart Disease: Yes - Mother, Paternal Grandparents; Hereditary Spherocytosis: No; Hypertension: Yes - Father, Paternal Grandparents; Kidney Disease: No; Lung Disease: No; Seizures: No; Stroke: No; Thyroid Problems: No; Tuberculosis: No; Never smoker; Marital Status - Married; Alcohol Use: Never; Drug Use: No History; Caffeine Use: Daily; Financial Concerns: No; Food, Clothing or Shelter Needs: No; Support System Lacking: No; Transportation Concerns: No; Advanced Directives: No; Patient does not want information on Advanced Directives; Do not resuscitate: No; Living Will: No; Medical Power of Attorney: Yes - RHONDA Lundstrom (Not Provided) Electronic Signature(s) Signed: 01/24/2016 5:55:17 PM By: [name] Mulling Signed: 01/24/2016 6:11:07 PM By: Baltazar Najjar MD Entered By: [name] Mulling on 01/24/2016 09:43:20 Kaffenberger, [name] Bien ([id number]) -------------------------------------------------------------------------------- SuperBill Details Patient Name: [name] C. Date of Service: 01/24/2016 Medical Record Patient Account Number: [id number] Number: Treating RN: Phillis Haggis 1950/12/02 (65 y.o. Other Clinician: Date of Birth/Sex: Male) Treating [name], [name] Primary Care Physician: [name] Sauer Physician/Extender: [name] Referring Physician: Elizabeth Sauer Weeks in Treatment: 0 Diagnosis Coding ICD-10 Codes Code Description [phone] Pressure ulcer of other site, stage 3 S14.103S Unspecified injury at C3 level of cervical spinal cord, sequela Facility Procedures CPT4 Code: 46962952 Description: 99213 - WOUND CARE VISIT-LEV 3 EST PT Modifier: Quantity: 1 CPT4 Code: 84132440 Description: 11042 - DEB SUBQ TISSUE 20 SQ CM/< ICD-10 Description Diagnosis 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, stage 3 Modifier: Quantity: 1 Physician Procedures CPT4 Code: 1027253 Description: 99204 - WC PHYS LEVEL 4 - NEW PT ICD-10 Description Diagnosis 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, stage 3 S14.103S Unspecified injury at C3 level of cervical spinal Modifier: cord, sequela Quantity: 1 CPT4 Code: 6644034 Description: 11042 - WC PHYS SUBQ TISS 20 SQ CM ICD-10 Description Diagnosis 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, stage 3 Modifier: Quantity: 1 Electronic Signature(s) Signed: 01/24/2016 6:11:07 PM By: Baltazar Najjar MD Entered By: Baltazar Najjar on 01/24/2016 18:08:58

## 2016-01-31 ENCOUNTER — Ambulatory Visit: Payer: Medicare Other | Admitting: Internal Medicine

## 2016-02-08 ENCOUNTER — Encounter: Payer: Medicare Other | Attending: Internal Medicine | Admitting: Internal Medicine

## 2016-02-08 DIAGNOSIS — L89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, stage 3: Secondary | ICD-10-CM | POA: Insufficient documentation

## 2016-02-08 DIAGNOSIS — G8250 Quadriplegia, unspecified: Secondary | ICD-10-CM | POA: Insufficient documentation

## 2016-02-08 DIAGNOSIS — F329 Major depressive disorder, single episode, unspecified: Secondary | ICD-10-CM | POA: Insufficient documentation

## 2016-02-08 DIAGNOSIS — Z8673 Personal history of transient ischemic attack (TIA), and cerebral infarction without residual deficits: Secondary | ICD-10-CM | POA: Diagnosis not present

## 2016-02-08 DIAGNOSIS — W19XXXS Unspecified fall, sequela: Secondary | ICD-10-CM | POA: Diagnosis not present

## 2016-02-08 DIAGNOSIS — S14103S Unspecified injury at C3 level of cervical spinal cord, sequela: Secondary | ICD-10-CM | POA: Insufficient documentation

## 2016-02-08 DIAGNOSIS — L89113 Pressure ulcer of right upper back, stage 3: Secondary | ICD-10-CM | POA: Diagnosis not present

## 2016-02-08 DIAGNOSIS — E119 Type 2 diabetes mellitus without complications: Secondary | ICD-10-CM | POA: Insufficient documentation

## 2016-02-08 DIAGNOSIS — R569 Unspecified convulsions: Secondary | ICD-10-CM | POA: Diagnosis not present

## 2016-02-08 DIAGNOSIS — E785 Hyperlipidemia, unspecified: Secondary | ICD-10-CM | POA: Diagnosis not present

## 2016-02-08 DIAGNOSIS — K219 Gastro-esophageal reflux disease without esophagitis: Secondary | ICD-10-CM | POA: Diagnosis not present

## 2016-02-08 DIAGNOSIS — Z7984 Long term (current) use of oral hypoglycemic drugs: Secondary | ICD-10-CM | POA: Insufficient documentation

## 2016-02-09 NOTE — Progress Notes (Signed)
[name], [name] ([id number]) Visit Report for 02/08/2016 Chief Complaint Document Details Patient Name: [name], [name] Date of Service: 02/08/2016 3:45 PM Medical Record Patient Account Number: [id number] Number: Treating RN: [name] 1950-08-30 [phone](65 y.o. Other Clinician: Date of Birth/Sex: Male) Treating [name] Primary Care Physician: Elizabeth SauerJones, [name] Physician/Extender: G Referring Physician: Elizabeth SauerJones, [name] Weeks in Treatment: 2 Information Obtained from: Patient Chief Complaint 01/24/16 patient arrives today for review of a pressure ulcer on his right scapula in the setting of incomplete C3-C4 quadriplegia Electronic Signature(s) Signed: 02/08/2016 4:39:00 PM By: [name] Najjarobson, Cereniti Curb [name] Entered By: [name] Najjarobson, Nessie Nong on 02/08/2016 16:35:06 [name], [name] ([id number]) -------------------------------------------------------------------------------- Debridement Details Patient Name: [name], [name] Date of Service: 02/08/2016 3:45 PM Medical Record Patient Account Number: [id number] Number: Treating RN: [name] 1950-08-30 [phone](65 y.o. Other Clinician: Date of Birth/Sex: Male) Treating [name] Primary Care Physician: Elizabeth SauerJones, [name] Physician/Extender: G Referring Physician: Elizabeth SauerJones, [name] Weeks in Treatment: 2 Debridement Performed for Wound #1 Right,Proximal Back Assessment: Performed By: Physician [name] G, [name] Debridement: Debridement Pre-procedure Yes - 16:17 Verification/Time Out Taken: Start Time: 16:17 Pain Control: Lidocaine 4% Topical Solution Level: Skin/Subcutaneous Tissue Total Area Debrided (L x 3 (cm) x 2.4 (cm) = 7.2 (cm) W): Tissue and other Exudate, Fibrin/Slough, Subcutaneous material debrided: Instrument: Curette Bleeding: Minimum Hemostasis Achieved: Pressure End Time: 16:19 Procedural Pain: 0 Post Procedural Pain: 0 Response to Treatment: Procedure was tolerated well Post  Debridement Measurements of Total Wound Length: (cm) 3 Stage: Category/Stage III Width: (cm) 2.4 Depth: (cm) 0.1 Volume: (cm) 0.565 Character of Wound/Ulcer Post Stable Debridement: Severity of Tissue Post Fat layer exposed Debridement: Post Procedure Diagnosis Same as Pre-procedure Electronic Signature(s) Signed: 02/08/2016 4:39:00 PM By: [name] Najjarobson, Keniyah Gelinas [name] [name], [name] ([id number]) Signed: 02/08/2016 5:11:17 PM By: [name] BSN, RN Entered By: [name] Najjarobson, Chevon Fomby on 02/08/2016 16:34:54 [name], [name] ([id number]) -------------------------------------------------------------------------------- HPI Details Patient Name: [name], [name] Date of Service: 02/08/2016 3:45 PM Medical Record Patient Account Number: [id number] Number: Treating RN: [name] 1950-08-30 [phone](65 y.o. 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 He has lower extremity sensation and can move his legs but has no/limited control over his arms. His wife accompanies him today and states that in the late spring or early summer of 2017 the patient became very depressed. He refused the refused to mobilize and he developed several pressure ulcers on his back. Most of these have healed however they have a recalcitrant area over the right scapula. They've been using Santyl on this for at least the last month. In terms of depression the patient is doing better now on an antidepressant. He saw his primary physician on 01/12/16 at which time there was apparently green drainage coming out of this area [Dr. Deana [name]]. Dr. Yetta BarreJones works [facility]. He has completed this Septra. Otherwise [facility] that he has a history of seizures. He also had a stroke in 2008 he follows with neurology. He also has type 2 diabetes on Glucophage, hyperlipidemia and gastroesophageal reflux. He takes Plavix for stroke prevention and Keppra for seizure prophylaxis. 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 02/08/16; this is a patient with a pressure ulcer over the right scapula. His wife has been doing the dressing with Santyl and border foam change every second day. When he arrived here 2 weeks ago we did a fairly extensive mechanical debridement. We are asked medical modalities to go out to the home and see what they might be eligible for in terms of pressure-relief surfaces [level 2] but the wife states that they have not heard from them Electronic Signature(s) Signed: 02/08/2016 4:39:00 PM By: [name] Entered By: [name] Najjar on 02/08/2016 16:36:08 [name], [name] ([id number]) -------------------------------------------------------------------------------- Physical Exam Details Patient Name: [name], [name] Date of Service: 02/08/2016 3:45 PM Medical Record Patient Account Number: [id number] Number: Treating RN: [name] Mealy, RN, BSN, [name] 1950/06/12 [phone] y.o. Other Clinician: Date of Birth/Sex: Male) Treating [name] Primary Care Physician: Elizabeth Sauer Physician/Extender: G Referring Physician: Elizabeth Sauer Weeks in Treatment: 2 Constitutional Patient is hypotensive.. Pulse regular and within target range for patient.Marland Kitchen Respirations regular, non-labored and within target range.. Temperature is normal and within the target range for the patient.. Patient's appearance is neat and clean. Appears in no acute distress. Well nourished and well developed.. Notes Wound exam; the area and questions over the right  scapula. This still required debridement with a #3 curet to remove surface slough and nonviable subcutaneous tissue after which the granulation looks quite healthy. There is no evidence of surrounding infection Electronic Signature(s) Signed: 02/08/2016 4:39:00 PM By: [name] Entered By: [name] Najjar on 02/08/2016 16:36:58 Newberry, [name] ([id number]) -------------------------------------------------------------------------------- Physician Orders Details Patient Name: [name], [name] Date of Service: 02/08/2016 3:45 PM Medical Record Patient Account Number: [id number] Number: Treating RN: [name] Mealy, RN, BSN, [name] 05-05-1950 [phone] y.o. Other Clinician: Date of Birth/Sex: Male) Treating Jeymi Hepp Primary Care Physician: Elizabeth Sauer Physician/Extender: G Referring Physician: Sherrian Divers in Treatment: 2 Verbal / Phone Orders: Yes Clinician: Afful, RN, BSN, [name] Read Back and Verified: Yes Diagnosis Coding Wound Cleansing Wound #1 Right,Proximal Back o Clean wound with Normal Saline. o Cleanse wound with mild soap and water Anesthetic Wound #1 Right,Proximal Back o Topical Lidocaine 4% cream applied to wound bed prior to debridement - CLINIC USE Skin Barriers/Peri-Wound Care Wound #1 Right,Proximal Back o Skin Prep Primary Wound Dressing Wound #1 Right,Proximal Back o Santyl Ointment Secondary Dressing Wound #1 Right,Proximal Back o Dry Gauze o Boardered Foam Dressing Dressing Change Frequency Wound #1 Right,Proximal Back o Change dressing every other day. Follow-up Appointments Wound #1 Right,Proximal Back o Return Appointment in 1 week. Off-Loading Wound #1 Right,Proximal Back o Mattress - ORDER MATTRESS OR MATTRESS COVER .Marland KitchenMarland KitchenMEDICAL MODALITIES Larios, ALLEN [place]. ([id number]) o Turn and reposition every 2 hours o Other: - Pad for wheelchair for positioning Additional Orders / Instructions Wound #1  Right,Proximal Back o Increase protein intake. Medications-please add to medication list. Wound #1 Right,Proximal Back o Santyl Enzymatic Ointment o Other: - VITAMIN C, ZINC Electronic Signature(s) Signed: 02/08/2016 4:39:00 PM By: [name] Signed: 02/08/2016 5:11:17 PM By: [name] Eric BSN, RN Entered By: [name] Eric on 02/08/2016 16:20:18 [place], [name] ([id number]) -------------------------------------------------------------------------------- Problem List Details Patient Name: Presswood, Artemis C. Date of Service: 02/08/2016 3:45 PM Medical Record Patient Account Number: [id number] Number: Treating RN: [name] Mealy, RN, BSN, [name] 30-Jun-1950 [phone] y.o. Other Clinician: Date of Birth/Sex: Male) Treating Jaquell Seddon Primary Care Physician: Elizabeth Sauer Physician/Extender: G Referring Physician: Elizabeth Sauer Weeks in  Treatment: 2 Active Problems ICD-10 Encounter Code Description Active Date Diagnosis 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, stage 3 01/24/2016 Yes S14.103S Unspecified injury at C3 level of cervical spinal cord, 01/24/2016 Yes sequela Inactive Problems Resolved Problems Electronic Signature(s) Signed: 02/08/2016 4:39:00 PM By: [name] Entered By: [name] Najjar on 02/08/2016 16:34:41 Brunet, [name] ([id number]) -------------------------------------------------------------------------------- Progress Note Details Patient Name: [name] Bras C. Date of Service: 02/08/2016 3:45 PM Medical Record Patient Account Number: [id number] Number: Treating RN: [name] Mealy, RN, BSN, [name] [birth year] [phone] y.o. 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 He has lower extremity sensation and can move his legs but has no/limited control over his arms. His wife accompanies him today and states that in the late spring or early summer of 2017 the patient became very depressed. He refused the refused to mobilize and he developed several pressure ulcers on his back. Most of these have healed however they have a recalcitrant area over the right scapula. They've been using Santyl on this for at least the last month. In terms of depression the patient is doing better now on an antidepressant. He saw his primary physician on 01/12/16 at which time there was apparently green drainage coming out of this area [Dr. Deana [name]]. Dr. Yetta Barre works in the [facility]. He has completed this Septra. Otherwise [facility] that he has a history of seizures. He also had a stroke in 2008 he follows with neurology. He also has type 2 diabetes on Glucophage, hyperlipidemia and gastroesophageal reflux. He takes Plavix for stroke prevention and Keppra for seizure prophylaxis. 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 02/08/16; this is a patient with a pressure ulcer over the right scapula. His wife has been doing the dressing with Santyl and border foam change every second day. When he arrived here 2 weeks ago we did a fairly extensive mechanical debridement. We are asked medical modalities to go out to the home and see what they might be eligible for in terms of pressure-relief surfaces [level 2] but the wife states that they have not heard from them Objective Constitutional Patient is hypotensive.. Pulse regular and within  target range for patient.Marland Kitchen Respirations regular, non-labored and within target range.. Temperature is normal and within the target range for the patient.. Patient's Walthall, Emersen C. ([id number]) appearance is neat and clean. Appears in no acute distress. Well nourished and well developed.. Vitals Time Taken: 4:07 PM, Height: 70 in, Weight: 187 lbs, BMI: 26.8, Temperature: 97.5 F, Pulse: 92 bpm, Respiratory Rate: 16 breaths/min, Blood Pressure: 95/57 mmHg. General Notes: Wound exam; the area and questions over the right scapula. This still required debridement with a #3 curet to remove surface slough and nonviable subcutaneous tissue after which the granulation looks quite healthy. There is no evidence of surrounding infection Integumentary (Hair, Skin) Wound #1 status is Open. Original cause of wound was Pressure Injury. The wound is located on the Right,Proximal Back. The  wound measures 3cm length x 2.4cm width x 0.1cm depth; 5.655cm^2 area and 0.565cm^3 volume. 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, stage 3 S14.103S - Unspecified injury at C3 level of cervical spinal cord, sequela Procedures Wound #1 Wound #1 is a Pressure Ulcer located on the Right,Proximal Back . There was a Skin/Subcutaneous Tissue Debridement ([id number](11042-11047) debridement with total area of 7.2 sq cm performed by [name] CaulOBSON, Davine Sweney G, [name]. with the following instrument(s): Curette including Exudate, Fibrin/Slough, and Subcutaneous after achieving pain control using Lidocaine 4% Topical Solution. A time out was conducted at 16:17, prior to the start of the procedure. A Minimum amount of bleeding was controlled with Pressure. The procedure was tolerated well with a pain level of 0 throughout and a pain level of 0 following the procedure. Post Debridement Measurements: 3cm length x 2.4cm width x 0.1cm depth; 0.565cm^3 volume. Post debridement Stage noted as Category/Stage III. Character of  Wound/Ulcer Post Debridement is stable. Severity of Tissue Post Debridement is: Fat layer exposed. Post procedure Diagnosis Wound #1: Same as Pre-Procedure [name], [name] ([id number]) Plan Wound Cleansing: Wound #1 Right,Proximal Back: Clean wound with Normal Saline. Cleanse wound with mild soap and water Anesthetic: Wound #1 Right,Proximal Back: Topical Lidocaine 4% cream applied to wound bed prior to debridement - CLINIC USE Skin Barriers/Peri-Wound Care: Wound #1 Right,Proximal Back: Skin Prep Primary Wound Dressing: Wound #1 Right,Proximal Back: Santyl Ointment Secondary Dressing: Wound #1 Right,Proximal Back: Dry Gauze Boardered Foam Dressing Dressing Change Frequency: Wound #1 Right,Proximal Back: Change dressing every other day. Follow-up Appointments: Wound #1 Right,Proximal Back: Return Appointment in 1 week. Off-Loading: Wound #1 Right,Proximal Back: Mattress - ORDER MATTRESS OR MATTRESS COVER .Marland Kitchen.Marland Kitchen.MEDICAL MODALITIES Turn and reposition every 2 hours Other: - Pad for wheelchair for positioning Additional Orders / Instructions: Wound #1 Right,Proximal Back: Increase protein intake. Medications-please add to medication list.: Wound #1 Right,Proximal Back: Santyl Enzymatic Ointment Other: - VITAMIN C, ZINC #1 I'm going to continue with the Santyl border foam change every second day for probably another week. And then changed to [facility] or [employer] [place], Sunn[place]ideRICHARD C. ([id number]) #2 we will see if medical modalities will go out to his home. The scapula wound may be more related to the back of his wheelchair than anything else Electronic Signature(s) Signed: 02/08/2016 4:39:00 PM By: [name] Najjarobson, Lundy Cozart [name] Entered By: [name] Najjarobson, Lajuane Leatham on 02/08/2016 16:37:39 Koke, [name] ([id number]) -------------------------------------------------------------------------------- SuperBill Details Patient Name: [name], Graiden C. Date of Service: 02/08/2016 Medical Record  Patient Account Number: [id number] Number: Treating RN: [name] MealyAfful, RN, BSN, [place] Sinkita 08-27-1950 [phone](65 y.o. Other Clinician: Date of Birth/Sex: Male) Treating Kerin Kren Primary Care Physician: Elizabeth SauerJones, [name] Physician/Extender: G Referring Physician: Elizabeth SauerJones, [name] Weeks in Treatment: 2 Diagnosis Coding ICD-10 Codes Code Description 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, stage 3 S14.103S Unspecified injury at C3 level of cervical spinal cord, sequela Facility Procedures CPT4 Code: [id number] Description: 11042 - DEB SUBQ TISSUE 20 SQ CM/< ICD-10 Description Diagnosis 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, stage 3 S14.103S Unspecified injury at C3 level of cervical spinal Modifier: cord, sequela Quantity: 1 Physician Procedures CPT4 Code: [id number] Description: 11042 - WC PHYS SUBQ TISS 20 SQ CM ICD-10 Description Diagnosis 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, stage 3 S14.103S Unspecified injury at C3 level of cervical spinal Modifier: cord, sequela Quantity: 1 Electronic Signature(s) Signed: 02/08/2016 4:39:00 PM By: [name] Najjarobson, Jamas Jaquay [name] Entered By: [name] Najjarobson, Kayshaun Polanco on 02/08/2016 16:38:00

## 2016-02-09 NOTE — Progress Notes (Signed)
VERA, WISHART ([id number]) Visit Report for 02/08/2016 Arrival Information Details Patient Name: [name], [name]. Date of Service: 02/08/2016 3:45 PM Medical Record Number: [id number] Patient Account Number: [id number] Date of Birth/Sex: 06-06-1950 (65 y.o. Male) Treating RN: Baruch Gouty, RN, BSN, Velva Harman Primary Care Physician: Otilio Miu Other Clinician: Referring Physician: Otilio Miu Treating Physician/Extender: Tito Dine in Treatment: 2 Visit Information History Since Last Visit All ordered tests and consults were completed: No Patient Arrived: Wheel Chair Added or deleted any medications: No Arrival Time: 16:05 Any new allergies or adverse reactions: No Accompanied By: wife Had a fall or experienced change in No activities of daily living that may affect Transfer Assistance: None risk of falls: Patient Identification Verified: Yes Signs or symptoms of abuse/neglect since last No Secondary Verification Process Yes visito Completed: Hospitalized since last visit: No Patient Requires Transmission-Based No Has Dressing in Place as Prescribed: Yes Precautions: Pain Present Now: No Patient Has Alerts: Yes Patient Alerts: DM II Plavix Electronic Signature(s) Signed: 02/08/2016 5:11:17 PM By: Regan Lemming BSN, RN Entered By: Regan Lemming on 02/08/2016 16:05:45 [place], Leslye Peer ([id number]) -------------------------------------------------------------------------------- Encounter Discharge Information Details Patient Name: [name] Coast C. Date of Service: 02/08/2016 3:45 PM Medical Record Number: [id number] Patient Account Number: [id number] Date of Birth/Sex: 02/04/1951 (65 y.o. Male) Treating RN: Baruch Gouty, RN, BSN, Velva Harman Primary Care Physician: Otilio Miu Other Clinician: Referring Physician: Otilio Miu Treating Physician/Extender: Tito Dine in Treatment: 2 Encounter Discharge Information Items Discharge Pain Level: 0 Discharge  Condition: Stable Ambulatory Status: Wheelchair Discharge Destination: Home Transportation: Private Auto Accompanied By: wife Schedule Follow-up Appointment: No Medication Reconciliation completed No and provided to Patient/Care Lovella [name]: Patient Clinical Summary of Care: Declined Electronic Signature(s) Signed: 02/08/2016 4:23:46 PM By: Ruthine Dose Entered By: Ruthine Dose on 02/08/2016 16:23:46 [place], Leslye Peer ([id number]) -------------------------------------------------------------------------------- Lower Extremity Assessment Details Patient Name: [name], Wendal C. Date of Service: 02/08/2016 3:45 PM Medical Record Number: [id number] Patient Account Number: [id number] Date of Birth/Sex: June 09, 1950 (65 y.o. Male) Treating RN: Baruch Gouty, RN, BSN, Velva Harman Primary Care Physician: Otilio Miu Other Clinician: Referring Physician: Otilio Miu Treating Physician/Extender: Ricard Dillon Weeks in Treatment: 2 Electronic Signature(s) Signed: 02/08/2016 4:13:36 PM By: Regan Lemming BSN, RN Entered By: Regan Lemming on 02/08/2016 16:13:36 [place], Leslye Peer ([id number]) -------------------------------------------------------------------------------- Multi Wound Chart Details Patient Name: [name], [name] Date of Service: 02/08/2016 3:45 PM Medical Record Number: [id number] Patient Account Number: [id number] Date of Birth/Sex: [birth year] (65 y.o. Male) Treating RN: Baruch Gouty, RN, BSN, Velva Harman Primary Care Physician: Otilio Miu Other Clinician: Referring Physician: Otilio Miu Treating Physician/Extender: Ricard Dillon Weeks in Treatment: 2 Vital Signs Height(in): 70 Pulse(bpm): 92 Weight(lbs): 187 Blood Pressure 95/57 (mmHg): Body Mass Index(BMI): 27 Temperature(F): 97.5 Respiratory Rate 16 (breaths/min): Photos: [1:No Photos] [N/A:N/A] Wound Location: [1:Right, Proximal Back] [N/A:N/A] Wounding Event: [1:Pressure Injury] [N/A:N/A] Primary Etiology: [1:Pressure  Ulcer] [N/A:N/A] Date Acquired: [1:11/24/2015] [N/A:N/A] Weeks of Treatment: [1:2] [N/A:N/A] Wound Status: [1:Open] [N/A:N/A] Measurements L x W x D 3x2.4x0.1 [N/A:N/A] (cm) Area (cm) : [1:5.655] [N/A:N/A] Volume (cm) : [1:0.565] [N/A:N/A] % Reduction in Area: [1:29.80%] [N/A:N/A] % Reduction in Volume: 29.80% [N/A:N/A] Classification: [1:Category/Stage II] [N/A:N/A] Periwound Skin Texture: No Abnormalities Noted [N/A:N/A] Periwound Skin [1:No Abnormalities Noted] [N/A:N/A] Moisture: Periwound Skin Color: No Abnormalities Noted [N/A:N/A] Tenderness on [1:No] [N/A:N/A] Treatment Notes Electronic Signature(s) Signed: 02/08/2016 4:13:51 PM By: Regan Lemming BSN, RN Entered By: Regan Lemming on 02/08/2016 16:13:50 [place], Leslye Peer ([id number]) -------------------------------------------------------------------------------- Multi-Disciplinary Care Plan Details Patient Name: [name] Coast C. Date of Service:  02/08/2016 3:45 PM Medical Record Number: [id number] Patient Account Number: [id number] Date of Birth/Sex: August 06, 1950 (65 y.o. Male) Treating RN: Baruch Gouty, RN, BSN, [place] Primary Care Physician: Otilio Miu Other Clinician: Referring Physician: Otilio Miu Treating Physician/Extender: Tito Dine in Treatment: 2 Active Inactive Abuse / Safety / Falls / Self Care Management Nursing Diagnoses: Potential for falls Goals: Patient will remain injury free Date Initiated: 01/24/2016 Goal Status: Active Interventions: Assess fall risk on admission and as needed Assess impairment of mobility on admission and as needed per policy Assess self care needs on admission and as needed Notes: Nutrition Nursing Diagnoses: Imbalanced nutrition Impaired glucose control: actual or potential Goals: Patient/caregiver agrees to and verbalizes understanding of need to use nutritional supplements and/or vitamins as prescribed Date Initiated: 01/24/2016 Goal Status:  Active Patient/caregiver will maintain therapeutic glucose control Date Initiated: 01/24/2016 Goal Status: Active Interventions: Assess patient nutrition upon admission and as needed per policy Provide education on elevated blood sugars and impact on wound healing Notes: ASCENCION, COYE ([id number]) Orientation to the Wound Care Program Nursing Diagnoses: Knowledge deficit related to the wound healing center program Goals: Patient/caregiver will verbalize understanding of the [facility] [facility] Date Initiated: 01/24/2016 Goal Status: Active Interventions: Provide education on orientation to the wound center Notes: Pain, Acute or Chronic Nursing Diagnoses: Pain, acute or chronic: actual or potential Potential alteration in comfort, pain Goals: Patient will verbalize adequate pain control and receive pain control interventions during procedures as needed Date Initiated: 01/24/2016 Goal Status: Active Patient/caregiver will verbalize adequate pain control between visits Date Initiated: 01/24/2016 Goal Status: Active Patient/caregiver will verbalize comfort level met Date Initiated: 01/24/2016 Goal Status: Active Interventions: Assess comfort goal upon admission Complete pain assessment as per visit requirements Notes: Pressure Nursing Diagnoses: Knowledge deficit related to management of pressures ulcers Potential for impaired tissue integrity related to pressure, friction, moisture, and shear Goals: Ditullio, EATON FOLMAR ([id number]) Patient will remain free from development of additional pressure ulcers Date Initiated: 01/24/2016 Goal Status: Active Interventions: Assess: immobility, friction, shearing, incontinence upon admission and as needed Assess offloading mechanisms upon admission and as needed Assess potential for pressure ulcer upon admission and as needed Provide education on pressure ulcers Notes: Wound/Skin Impairment Nursing Diagnoses: Impaired  tissue integrity Goals: Ulcer/skin breakdown will have a volume reduction of 30% by week 4 Date Initiated: 01/24/2016 Goal Status: Active Ulcer/skin breakdown will have a volume reduction of 50% by week 8 Date Initiated: 01/24/2016 Goal Status: Active Ulcer/skin breakdown will have a volume reduction of 80% by week 12 Date Initiated: 01/24/2016 Goal Status: Active Interventions: Assess patient/caregiver ability to perform ulcer/skin care regimen upon admission and as needed Assess ulceration(s) every visit Notes: Electronic Signature(s) Signed: 02/08/2016 4:13:45 PM By: Regan Lemming BSN, RN Entered By: Regan Lemming on 02/08/2016 16:13:44 [place], Leslye Peer ([id number]) -------------------------------------------------------------------------------- Pain Assessment Details Patient Name: [name] Coast C. Date of Service: 02/08/2016 3:45 PM Medical Record Number: [id number] Patient Account Number: [id number] Date of Birth/Sex: Jun 16, 1950 (65 y.o. Male) Treating RN: Baruch Gouty, RN, BSN, Velva Harman Primary Care Physician: Otilio Miu Other Clinician: Referring Physician: Otilio Miu Treating Physician/Extender: Ricard Dillon Weeks in Treatment: 2 Active Problems Location of Pain Severity and Description of Pain Patient Has Paino No Site Locations With Dressing Change: No Pain Management and Medication Current Pain Management: Electronic Signature(s) Signed: 02/08/2016 5:11:17 PM By: Regan Lemming BSN, RN Entered By: Regan Lemming on 02/08/2016 16:05:52 [place], Leslye Peer ([id number]) -------------------------------------------------------------------------------- Patient/Caregiver Education Details Patient Name: [name]. Date of Service:  02/08/2016 3:45 PM Medical Record Number: [id number] Patient Account Number: [id number] Date of Birth/Gender: Nov 14, 1950 (65 y.o. Male) Treating RN: Baruch Gouty, RN, BSN, Velva Harman Primary Care Physician: Otilio Miu Other Clinician: Referring  Physician: Otilio Miu Treating Physician/Extender: Tito Dine in Treatment: 2 Education Assessment Education Provided To: Patient Education Topics Provided Elevated Blood Sugar/ Impact on Healing: Methods: Explain/Verbal Responses: State content correctly Pressure: Methods: Explain/Verbal Responses: State content correctly Welcome To [facility]: Methods: Explain/Verbal Responses: State content correctly Wound Debridement: Methods: Explain/Verbal Responses: State content correctly Wound/Skin Impairment: Methods: Explain/Verbal Responses: State content correctly Electronic Signature(s) Signed: 02/08/2016 5:11:17 PM By: Regan Lemming BSN, RN Entered By: Regan Lemming on 02/08/2016 16:23:30 [place], Leslye Peer ([id number]) -------------------------------------------------------------------------------- Wound Assessment Details Patient Name: [place], Carliss C. Date of Service: 02/08/2016 3:45 PM Medical Record Number: [id number] Patient Account Number: [id number] Date of Birth/Sex: [birth year] (65 y.o. Male) Treating RN: Baruch Gouty, RN, BSN, [place] Primary Care Physician: Otilio Miu Other Clinician: Referring Physician: Otilio Miu Treating Physician/Extender: Ricard Dillon Weeks in Treatment: 2 Wound Status Wound Number: 1 Primary Etiology: Pressure Ulcer Wound Location: Right, Proximal Back Wound Status: Open Wounding Event: Pressure Injury Date Acquired: 11/24/2015 Weeks Of Treatment: 2 Clustered Wound: No Photos Photo Uploaded By: Regan Lemming on 02/08/2016 17:16:52 Wound Measurements Length: (cm) 3 Width: (cm) 2.4 Depth: (cm) 0.1 Area: (cm) 5.655 Volume: (cm) 0.565 % Reduction in Area: 29.8% % Reduction in Volume: 29.8% Wound Description Classification: Category/Stage II Periwound Skin Texture Texture Color No Abnormalities Noted: No No Abnormalities Noted: No Moisture No Abnormalities Noted: No Treatment Notes Wound #1 (Right,  Proximal Back) 1. Cleansed with: [place], Josemanuel C. ([id number]) Clean wound with Normal Saline 2. Anesthetic Topical Lidocaine 4% cream to wound bed prior to debridement 3. Peri-wound Care: Skin Prep 4. Dressing Applied: Santyl Ointment 5. Secondary Dressing Applied Bordered Foam Dressing Dry Gauze Electronic Signature(s) Signed: 02/08/2016 5:11:17 PM By: Regan Lemming BSN, RN Entered By: Regan Lemming on 02/08/2016 16:08:10 [place], Leslye Peer ([id number]) -------------------------------------------------------------------------------- [place] Details Patient Name: [name] Coast C. Date of Service: 02/08/2016 3:45 PM Medical Record Number: [id number] Patient Account Number: [id number] Date of Birth/Sex: 1950-04-10 (65 y.o. Male) Treating RN: Baruch Gouty, RN, BSN, [place] Primary Care Physician: Otilio Miu Other Clinician: Referring Physician: Otilio Miu Treating Physician/Extender: Ricard Dillon Weeks in Treatment: 2 Vital Signs Time Taken: 16:07 Temperature (F): 97.5 Height (in): 70 Pulse (bpm): 92 Weight (lbs): 187 Respiratory Rate (breaths/min): 16 Body Mass Index (BMI): 26.8 Blood Pressure (mmHg): 95/57 Reference Range: 80 - 120 mg / dl Electronic Signature(s) Signed: 02/08/2016 5:11:17 PM By: Regan Lemming BSN, RN Entered By: Regan Lemming on 02/08/2016 16:12:14

## 2016-02-14 ENCOUNTER — Ambulatory Visit (INDEPENDENT_AMBULATORY_CARE_PROVIDER_SITE_OTHER): Payer: Medicare Other | Admitting: Vascular Surgery

## 2016-02-15 ENCOUNTER — Ambulatory Visit: Payer: Medicare Other | Admitting: Internal Medicine

## 2016-02-21 ENCOUNTER — Ambulatory Visit: Payer: Medicare Other | Admitting: Internal Medicine

## 2016-03-01 ENCOUNTER — Other Ambulatory Visit: Payer: Self-pay | Admitting: Internal Medicine

## 2016-03-20 ENCOUNTER — Ambulatory Visit (INDEPENDENT_AMBULATORY_CARE_PROVIDER_SITE_OTHER): Payer: Medicare Other | Admitting: Vascular Surgery

## 2016-03-20 ENCOUNTER — Encounter: Payer: Medicare Other | Attending: Internal Medicine | Admitting: Internal Medicine

## 2016-03-20 ENCOUNTER — Encounter (INDEPENDENT_AMBULATORY_CARE_PROVIDER_SITE_OTHER): Payer: Self-pay | Admitting: Vascular Surgery

## 2016-03-20 VITALS — BP 102/74 | HR 68 | Resp 16 | Ht 69.0 in | Wt 148.0 lb

## 2016-03-20 DIAGNOSIS — E119 Type 2 diabetes mellitus without complications: Secondary | ICD-10-CM | POA: Diagnosis not present

## 2016-03-20 DIAGNOSIS — S14103S Unspecified injury at C3 level of cervical spinal cord, sequela: Secondary | ICD-10-CM | POA: Diagnosis not present

## 2016-03-20 DIAGNOSIS — I6529 Occlusion and stenosis of unspecified carotid artery: Secondary | ICD-10-CM | POA: Insufficient documentation

## 2016-03-20 DIAGNOSIS — F329 Major depressive disorder, single episode, unspecified: Secondary | ICD-10-CM | POA: Diagnosis not present

## 2016-03-20 DIAGNOSIS — K219 Gastro-esophageal reflux disease without esophagitis: Secondary | ICD-10-CM | POA: Insufficient documentation

## 2016-03-20 DIAGNOSIS — L89130 Pressure ulcer of right lower back, unstageable: Secondary | ICD-10-CM | POA: Diagnosis not present

## 2016-03-20 DIAGNOSIS — E785 Hyperlipidemia, unspecified: Secondary | ICD-10-CM | POA: Insufficient documentation

## 2016-03-20 DIAGNOSIS — G8250 Quadriplegia, unspecified: Secondary | ICD-10-CM | POA: Diagnosis not present

## 2016-03-20 DIAGNOSIS — Z8673 Personal history of transient ischemic attack (TIA), and cerebral infarction without residual deficits: Secondary | ICD-10-CM | POA: Insufficient documentation

## 2016-03-20 DIAGNOSIS — R569 Unspecified convulsions: Secondary | ICD-10-CM | POA: Insufficient documentation

## 2016-03-20 DIAGNOSIS — I6523 Occlusion and stenosis of bilateral carotid arteries: Secondary | ICD-10-CM | POA: Diagnosis not present

## 2016-03-20 DIAGNOSIS — W19XXXS Unspecified fall, sequela: Secondary | ICD-10-CM | POA: Insufficient documentation

## 2016-03-20 DIAGNOSIS — L89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, stage 3: Secondary | ICD-10-CM | POA: Insufficient documentation

## 2016-03-20 DIAGNOSIS — Z7984 Long term (current) use of oral hypoglycemic drugs: Secondary | ICD-10-CM | POA: Diagnosis not present

## 2016-03-20 HISTORY — DX: Occlusion and stenosis of unspecified carotid artery: I65.29

## 2016-03-20 NOTE — Assessment & Plan Note (Signed)
lipid control important in reducing the progression of atherosclerotic disease. Continue statin therapy  

## 2016-03-20 NOTE — Assessment & Plan Note (Signed)
He has not had a carotid duplex in some time. I'm going to refill his Plavix today and we will plan on getting him a carotid duplex in several months.

## 2016-03-20 NOTE — Progress Notes (Signed)
MRN : [id number]  [name] is a 66 y.o. ([birth year]) male who presents with chief complaint of  Chief Complaint  Patient presents with  . Re-evaluation    Follow up  .  History of Present Illness: Patient returns in follow-up of his carotid disease. He is not eating well and has become somewhat withdrawn. He missed his last appointment and has not had an ultrasound in some time. He had what sounds like a possible TIA several months ago without further residual deficits. 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 The TIA manifested with facial droop which resolved within a few hours. He has run out of his Plavix and needs a prescription for this.  Current Outpatient Prescriptions  Medication Sig Dispense Refill  . ALPRAZolam (XANAX) 0.25 MG tablet Take 1 tablet (0.25 mg total) by mouth at bedtime. 30 tablet 5  . atorvastatin (LIPITOR) 10 MG tablet Take 1 tablet by mouth  daily 90 tablet 0  . buPROPion (WELLBUTRIN) 75 MG tablet Take 1 tablet by mouth two  times daily 180 tablet 0  . cephALEXin (KEFLEX) 500 MG capsule Take 1 capsule (500 mg total) by mouth 3 (three) times daily. X 5 days 15 capsule 0  . citalopram (CELEXA) 20 MG tablet TAKE 1 TABLET (20 MG TOTAL) BY MOUTH DAILY. 30 tablet 0  . clopidogrel (PLAVIX) 75 MG tablet Take 1 tablet by mouth  daily 90 tablet 0  . diazepam (VALIUM) 5 MG tablet Take 1 tablet by mouth 3 (three) times daily.    . furosemide (LASIX) 20 MG tablet Take 1 tablet (20 mg total) by mouth daily. 90 tablet 1  . furosemide (LASIX) 20 MG tablet TAKE 1 TABLET (20 MG TOTAL) BY MOUTH DAILY. 30 tablet 0  . levETIRAcetam (KEPPRA) 500 MG tablet Take 1 tablet (500 mg total) by mouth 2 (two) times daily. 180 tablet 0  . metFORMIN (GLUCOPHAGE) 500 MG tablet Take 1 tablet (500 mg total) by mouth 2 (two) times daily with a meal. 180 tablet 3  . oxybutynin (DITROPAN) 5 MG tablet Take 1 tablet by mouth 3  times daily 270 tablet 0  .  pantoprazole (PROTONIX) 40 MG tablet Take 1 tablet by mouth  daily 90 tablet 0  . sulfamethoxazole-trimethoprim (BACTRIM DS,SEPTRA DS) 800-160 MG tablet Take 1 tablet by mouth 2 (two) times daily. 14 tablet 0  . tizanidine (ZANAFLEX) 6 MG capsule Take 1 capsule by mouth 2 (two) times daily.     No current facility-administered medications for this visit.     Past Medical History:  Diagnosis Date  . Allergy   . Anxiety   . CAD (coronary artery disease)   . Diabetes mellitus without complication (HCC)   . GERD (gastroesophageal reflux disease)   . Hyperlipidemia   . Quadriplegia (HCC)   . Seizures (HCC)   . Spinal cord disease (HCC)   . Stroke [facility])     Past Surgical History:  Procedure Laterality Date  . ENDARTERECTOMY    . ENDARTERECTOMY    . TRACHEOSTOMY      Social History Social History  Substance Use Topics  . Smoking status: Never Smoker  . Smokeless tobacco: Never Used  . Alcohol use No    Family History Family History  Problem Relation Age of Onset  . Stroke Father    No Known Allergies   REVIEW OF SYSTEMS (Negative unless checked)  Constitutional: [] Weight loss  [] Fever  [] Chills Cardiac: []   Chest pain   [] Chest pressure   [] Palpitations   [] Shortness of breath when laying flat   [] Shortness of breath at rest   [] Shortness of breath with exertion. Vascular:  [] Pain in legs with walking   [] Pain in legs at rest   [] Pain in legs when laying flat   [] Claudication   [] Pain in feet when walking  [] Pain in feet at rest  [] Pain in feet when laying flat   [] History of DVT   [] Phlebitis   [] Swelling in legs   [] Varicose veins   [x] Non-healing ulcers Pulmonary:   [] Uses home oxygen   [] Productive cough   [] Hemoptysis   [] Wheeze  [] COPD   [] Asthma Neurologic:  [] Dizziness  [] Blackouts   [x] Seizures   [x] History of stroke   [x] History of TIA  [] Aphasia   [] Temporary blindness   [] Dysphagia   [x] Weakness or numbness in arms   [x] Weakness or numbness in  legs Musculoskeletal:  [] Arthritis   [] Joint swelling   [] Joint pain   [] Low back pain Hematologic:  [] Easy bruising  [] Easy bleeding   [] Hypercoagulable state   [] Anemic  [] Hepatitis Gastrointestinal:  [] Blood in stool   [] Vomiting blood  [] Gastroesophageal reflux/heartburn   [] Difficulty swallowing. Genitourinary:  [] Chronic kidney disease   [] Difficult urination  [] Frequent urination  [] Burning with urination   [] Blood in urine Skin:  [] Rashes   [x] Ulcers   [x] Wounds Psychological:  [] History of anxiety   []  History of major depression.  Physical Examination  Vitals:   03/20/16 1314  BP: 102/74  Pulse: 68  Resp: 16  Weight: 148 lb (67.1 kg)  Height: 5\' 9"  (1.753 m)   Body mass index is 21.86 kg/m. Gen:  Thin, chronically ill appearing Head:  + temporalis wasting. Ear/Nose/Throat: Hearing grossly intact, nares w/o erythema or drainage, trachea midline Eyes: Conjunctiva clear. Sclera non-icteric Neck: Supple.  No JVD. Right carotid bruit Pulmonary:  Good air movement, equal and clear to auscultation bilaterally.  Cardiac: RRR, normal S1, S2, no Murmurs, rubs or gallops. Vascular:  Vessel Right Left  Radial Palpable Palpable                                   Gastrointestinal: soft, non-tender/non-distended. No guarding/reflex.  Musculoskeletal: M/S 1/5 throughout.  In a wheelchair Neurologic: CN 2-12 intact. BUE and BLE paresis present. Psychiatric: Judgment intact, Mood & affect appropriate for pt's clinical situation. Dermatologic: No rashes or ulcers noted.  No cellulitis or open wounds. Lymph : No Cervical, Axillary, or Inguinal lymphadenopathy.     CBC Lab Results  Component Value Date   WBC 13.6 (H) 05/13/2015   HGB 12.2 (L) 05/13/2015   HCT 36.7 (L) 05/13/2015   MCV 91.0 05/13/2015   PLT 168 05/13/2015    BMET    Component Value Date/Time   NA 136 05/12/2015 2236   NA 135 (L) 12/22/2014 1019   NA 144 08/13/2012 0215   K 4.3 05/12/2015 2236   K  3.6 08/13/2012 0215   CL 101 05/12/2015 2236   CL 112 (H) 08/13/2012 0215   CO2 26 05/12/2015 2236   CO2 27 08/13/2012 0215   GLUCOSE 159 (H) 05/12/2015 2236   GLUCOSE 103 (H) 08/13/2012 0215   BUN 20 05/12/2015 2236   BUN 15 12/22/2014 1019   BUN 11 08/13/2012 0215   CREATININE 0.87 05/13/2015 0550   CREATININE 0.98 08/13/2012 0215   CALCIUM 8.9 05/12/2015 2236  CALCIUM 8.3 (L) 08/13/2012 0215   GFRNONAA >60 05/13/2015 0550   GFRNONAA >60 08/13/2012 0215   GFRAA >60 05/13/2015 0550   GFRAA >60 08/13/2012 0215   CrCl cannot be calculated (Patient's most recent lab result is older than the maximum 21 days allowed.).  COAG Lab Results  Component Value Date   INR 1.1 12/22/2014   INR 1.0 08/11/2012    Radiology No results found.   Assessment/Plan HLD (hyperlipidemia) lipid control important in reducing the progression of atherosclerotic disease. Continue statin therapy   Quadriplegia (HCC) From spinal cord injury. Stable over many years.  Carotid stenosis He has not had a carotid duplex in some time. I'm going to refill his Plavix today and we will plan on getting him a carotid duplex in several months.    Festus Barren, MD  03/20/2016 1:56 PM    This note was created with Dragon medical transcription system.  Any errors from dictation are purely unintentional

## 2016-03-20 NOTE — Assessment & Plan Note (Signed)
From spinal cord injury. Stable over many years.

## 2016-03-21 NOTE — Progress Notes (Signed)
KAIZEN, IBSEN ([id number]) Visit Report for 03/20/2016 Chief Complaint Document Details Patient Name: [name], [name]. Date of Service: 03/20/2016 11:15 AM Medical Record Number: [id number] Patient Account Number: [id number] Date of Birth/Sex: [birth year] (66 y.o. Male) Treating RN: Huel Coventry Primary Care Provider: Elizabeth Sauer Other Clinician: Referring Provider: Elizabeth Sauer Treating Provider/Extender: Maxwell Caul Weeks in Treatment: 8 Information Obtained from: Patient Chief Complaint 01/24/16 patient arrives today for review of a pressure ulcer on his right scapula in the setting of incomplete C3-C4 quadriplegia Electronic Signature(s) Signed: 03/20/2016 4:44:13 PM By: Baltazar Najjar MD Entered By: Baltazar Najjar on 03/20/2016 13:19:50 Tersigni, [name] ([id number]) -------------------------------------------------------------------------------- HPI Details Patient Name: [name] Bras C. Date of Service: 03/20/2016 11:15 AM Medical Record Number: [id number] Patient Account Number: [id number] Date of Birth/Sex: [birth year] (66 y.o. 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 He has lower extremity sensation and can move his legs but has no/limited control over his arms. His wife accompanies him today and states that in the late spring or early summer of 2017 the patient became very depressed. He refused the refused to mobilize and he developed several pressure ulcers on his back. Most of these have healed however they have a recalcitrant area over the right scapula. They've been using Santyl on this for at least the last month. In terms of  depression the patient is doing better now on an antidepressant. He saw his primary physician on 01/12/16 at which time there was apparently green drainage coming out of this area [Dr. Deana Jones]. Dr. Yetta Barre works in the [facility]. He has completed this Septra. Otherwise looking through [facility] notes that he has a history of seizures. He also had a stroke in 2008 he follows with neurology. He also has type 2 diabetes on Glucophage, hyperlipidemia and gastroesophageal reflux. He takes Plavix for stroke prevention and Keppra for seizure prophylaxis. 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 02/08/16; this is a patient with a pressure ulcer over the right scapula. His wife has been doing the dressing with Santyl and border foam change every second day. When he arrived here 2 weeks ago we did a fairly extensive mechanical debridement. We are asked medical modalities to go out to the home and see what they might be eligible for in terms of pressure-relief surfaces [level 2] but the wife states that they have not heard from them. 03/20/15; this is a patient we haven't seen in 5-6 weeks. This was largely due to transportation issues. They've been using Santyl and border foam changing every second day for a pressure injury over the right scapula. The patient has a C3-C4 spinal injury. We had also asked for a review by the people who supplied DME to look at his wheelchair cushion, mattress etc. I don't know that this ever happened. In the meantime the wound has done remarkably well current measurements 1.8 x 2 x 0.1 Electronic Signature(s) Signed: 03/20/2016 4:44:13 PM By: Baltazar Najjar MD Entered By: Baltazar Najjar on 03/20/2016 13:23:01 Steiner, [name] ([id number]) -------------------------------------------------------------------------------- Physical Exam Details Patient Name: [name], [name] Date of Service: 03/20/2016 11:15  AM Medical Record Number: [id number] Patient Account Number: [id number] Date of Birth/Sex: Jan 16, 1951 (66 y.o. Male) Treating RN: Huel Coventry Primary Care Provider: Elizabeth Sauer Other Clinician: Referring Provider: Elizabeth Sauer Treating Provider/Extender: Maxwell Caul Weeks in Treatment: 8 Constitutional Patient is hypotensive.. Pulse regular and within target range for patient.Marland Kitchen Respirations regular, non-labored and within target range.. Temperature is normal and within the target range for the patient.. The patient does not appear to be in any distress.. Notes Wound exam; the area in questions over the right scapula still some mild surface slough our ride did not think any debridement was necessary. There is obviously been a considerable degree of healing here. Electronic Signature(s) Signed: 03/20/2016 4:44:13 PM By: Baltazar Najjar MD Entered By: Baltazar Najjar on 03/20/2016 13:23:44 Gullickson, [name] ([id number]) -------------------------------------------------------------------------------- Physician Orders Details Patient Name: [name], [name] Date of Service: 03/20/2016 11:15 AM Medical Record Number: [id number] Patient Account Number: [id number] Date of Birth/Sex: 14-May-1950 (66 y.o. Male) Treating RN: Huel Coventry Primary Care Provider: Elizabeth Sauer Other Clinician: Referring Provider: Elizabeth Sauer Treating Provider/Extender: [place] in Treatment: 8 Verbal / Phone Orders: No Diagnosis Coding Wound Cleansing Wound #1 Right,Proximal Back o Clean wound with Normal Saline. o Cleanse wound with mild soap and water Anesthetic Wound #1 Right,Proximal Back o Topical Lidocaine 4% cream applied to wound bed prior to debridement - CLINIC USE Skin Barriers/Peri-Wound Care Wound #1 Right,Proximal Back o Skin Prep Primary Wound Dressing Wound #1 Right,Proximal Back o Santyl Ointment Secondary Dressing Wound  #1 Right,Proximal Back o Dry Gauze o Boardered Foam Dressing Dressing Change Frequency Wound #1 Right,Proximal Back o Change dressing every other day. Follow-up Appointments Wound #1 Right,Proximal Back o Return Appointment in 2 weeks. Off-Loading Wound #1 Right,Proximal Back o Mattress - ORDER MATTRESS OR MATTRESS COVER .Marland KitchenMarland KitchenMEDICAL MODALITIES o Turn and reposition every 2 hours o Other: - Pad for wheelchair for positioning [place], Bruin C. ([id number]) Additional Orders / Instructions Wound #1 Right,Proximal Back o Increase protein intake. Medications-please add to medication list. Wound #1 Right,Proximal Back o Santyl Enzymatic Ointment o Other: - VITAMIN C, ZINC Electronic Signature(s) Signed: 03/20/2016 4:44:13 PM By: Baltazar Najjar MD Signed: 03/21/2016 8:33:34 AM By: Elliot Gurney, RN, BSN, Kim RN, BSN Entered By: Elliot Gurney, RN, BSN, Kim on 03/20/2016 11:35:02 Hollon, [name] ([id number]) -------------------------------------------------------------------------------- Problem List Details Patient Name: DARRLY, LOBERG C. Date of Service: 03/20/2016 11:15 AM Medical Record Number: [id number] Patient Account Number: [id number] Date of Birth/Sex: [birth year] (66 y.o. Male) Treating RN: Huel Coventry Primary Care Provider: Elizabeth Sauer Other Clinician: Referring Provider: Elizabeth Sauer Treating Provider/Extender: Maxwell Caul Weeks in Treatment: 8 Active Problems ICD-10 Encounter Code Description Active Date Diagnosis 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, stage 3 01/24/2016 Yes S14.103S Unspecified injury at C3 level of cervical spinal cord, 01/24/2016 Yes sequela Inactive Problems Resolved Problems Electronic Signature(s) Signed: 03/20/2016 4:44:13 PM By: Baltazar Najjar MD Entered By: Baltazar Najjar on 03/20/2016 13:15:50 Mogg, [name] ([id number]) -------------------------------------------------------------------------------- Progress Note  Details Patient Name: [name] Bras C. Date of Service: 03/20/2016 11:15 AM Medical Record Number: [id number] Patient Account Number: [id number] Date of Birth/Sex: September 25, 1950 (66 y.o. 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 He has lower extremity sensation and can move his legs but has no/limited control over his arms. His wife accompanies him today and states that in the late spring or early summer of 2017 the patient became very depressed. He refused the refused to mobilize and he developed several pressure ulcers on his back. Most of these have healed however they have a recalcitrant area over the right scapula. They've been using Santyl on this for at least the last month. In terms of depression the patient is doing better now on an antidepressant. He saw his primary physician on 01/12/16 at which time there was apparently green drainage coming out of this area [Dr. Deana Jones]. Dr. Yetta BarreJones works in the [facility]. He has completed this Septra. Otherwise looking [facility] notes that he has a history of seizures. He also had a stroke in 2008 he follows with neurology. He also has type 2 diabetes on Glucophage, hyperlipidemia and gastroesophageal reflux. He takes Plavix for stroke prevention and Keppra for seizure prophylaxis. 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 02/08/16; this is a patient with a pressure ulcer over the right scapula. His wife has been doing the dressing with Santyl and border foam change every second day. When he arrived here 2 weeks ago we did a fairly extensive mechanical debridement. We are asked medical modalities to go out to the home and see what they might be eligible for in terms of pressure-relief surfaces [level 2] but the wife states that they have not heard from them. 03/20/15; this is a patient we haven't seen in 5-6 weeks. This was largely due to transportation issues. They've been using Santyl and border foam changing every second day for a pressure injury over the right scapula. The patient has a C3-C4 spinal injury. We had also asked for a review by the people who supplied DME to look at his wheelchair cushion, mattress etc. I don't know that this ever happened. In the meantime the wound has done remarkably well current measurements 1.8 x 2 x 0.1 Objective Kawano, Clinton C. ([id number]) Constitutional Patient is hypotensive.. Pulse regular and within target range for patient.Marland Kitchen. Respirations regular, non-labored and within target range.. Temperature is normal and within the target range for the patient.. The patient does not appear to be in any distress.. Vitals Time Taken: 11:14 AM, Height: 70 in, Weight: 187 lbs, BMI: 26.8, Pulse: 92 bpm, Respiratory Rate: 16 breaths/min, Blood Pressure: 88/48 mmHg. General Notes: Wound exam; the area in questions over the right scapula still some mild surface slough our ride did not think any debridement was necessary. There is obviously been a considerable degree of healing here. Integumentary (Hair, Skin) Wound #1 status is Open. Original cause of wound was Pressure Injury. The wound is located on the Right,Proximal Back. The wound measures 1.8cm length x 2cm width x 0.1cm depth; 2.827cm^2 area and 0.283cm^3 volume. There is Fat Layer (Subcutaneous Tissue) Exposed exposed.  There is no tunneling or undermining noted. There is a medium amount of serous drainage noted. The wound margin is flat and intact. There is small (1-33%) red, pink granulation within the wound bed. There is a medium (34-66%) amount of necrotic tissue within the wound bed including Adherent Slough. The periwound skin appearance exhibited: Hemosiderin Staining. The  periwound skin appearance did not exhibit: Callus, Crepitus, Excoriation, Induration, Rash, Scarring, Dry/Scaly, Maceration, Atrophie Blanche, Cyanosis, Ecchymosis, Mottled, Pallor, Rubor, Erythema. 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, stage 3 S14.103S - Unspecified injury at C3 level of cervical spinal cord, sequela Plan Wound Cleansing: Wound #1 Right,Proximal Back: Clean wound with Normal Saline. Cleanse wound with mild soap and water Anesthetic: Wound #1 Right,Proximal Back: HATCHWindle, Huebert. ([id number]) Topical Lidocaine 4% cream applied to wound bed prior to debridement - CLINIC USE Skin Barriers/Peri-Wound Care: Wound #1 Right,Proximal Back: Skin Prep Primary Wound Dressing: Wound #1 Right,Proximal Back: Santyl Ointment Secondary Dressing: Wound #1 Right,Proximal Back: Dry Gauze Boardered Foam Dressing Dressing Change Frequency: Wound #1 Right,Proximal Back: Change dressing every other day. Follow-up Appointments: Wound #1 Right,Proximal Back: Return Appointment in 2 weeks. Off-Loading: Wound #1 Right,Proximal Back: Mattress - ORDER MATTRESS OR MATTRESS COVER .Marland KitchenMarland KitchenMEDICAL MODALITIES Turn and reposition every 2 hours Other: - Pad for wheelchair for positioning Additional Orders / Instructions: Wound #1 Right,Proximal Back: Increase protein intake. Medications-please add to medication list.: Wound #1 Right,Proximal Back: Santyl Enzymatic Ointment Other: - VITAMIN C, ZINC continue with the santylbased dressing border foam no debridement as long as the demensions  continue to improved Electronic Signature(s) Signed: 03/20/2016 4:44:13 PM By: Baltazar Najjar MD Entered By: Baltazar Najjar on 03/20/2016 13:25:40 Wendorf, [name] ([id number]) -------------------------------------------------------------------------------- SuperBill Details Patient Name: [name] Bras C. Date of Service: 03/20/2016 Medical Record Number: [id number] Patient Account Number: [id number] Date of Birth/Sex: 05-30-1950 (66 y.o. Male) Treating RN: Huel Coventry Primary Care Provider: Elizabeth Sauer Other Clinician: Referring Provider: Elizabeth Sauer Treating Provider/Extender: Maxwell Caul Weeks in Treatment: 8 Diagnosis Coding ICD-10 Codes Code Description [phone] Pressure ulcer of other site, stage 3 S14.103S Unspecified injury at C3 level of cervical spinal cord, sequela Facility Procedures CPT4 Code: [id number] Description: [phone] - WOUND CARE VISIT-LEV 2 EST PT Modifier: Quantity: 1 Physician Procedures CPT4 Code: [id number] Description: 25366 - WC PHYS LEVEL 2 - EST PT ICD-10 Description Diagnosis 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, stage 3 Modifier: Quantity: 1 Electronic Signature(s) Signed: 03/20/2016 4:44:13 PM By: Baltazar Najjar MD Entered By: Baltazar Najjar on 03/20/2016 13:26:09

## 2016-03-21 NOTE — Progress Notes (Signed)
MAGDIEL, BARTLES ([id number]) Visit Report for 03/20/2016 Arrival Information Details Patient Name: NOLYN, SWAB. Date of Service: 03/20/2016 11:15 AM Medical Record Number: [id number] Patient Account Number: [id number] Date of Birth/Sex: 08-Aug-1950 (66 y.o. Male) Treating RN: Cornell Barman Primary Care Calley Drenning: Otilio Miu Other Clinician: Referring Toi Stelly: Otilio Miu Treating Aymar Whitfill/Extender: Tito Dine in Treatment: 8 Visit Information History Since Last Visit Added or deleted any medications: No Patient Arrived: Wheel Chair Any new allergies or adverse reactions: No Arrival Time: 11:13 Had a fall or experienced change in No activities of daily living that may affect Accompanied By: wife risk of falls: Transfer Assistance: None Signs or symptoms of abuse/neglect since last No Patient Identification Verified: Yes visito Secondary Verification Process Yes Hospitalized since last visit: No Completed: Has Dressing in Place as Prescribed: Yes Patient Requires Transmission-Based No Pain Present Now: No Precautions: Patient Has Alerts: Yes Patient Alerts: DM II Plavix Notes Patient remained in wheelchair for treatment. Electronic Signature(s) Signed: 03/21/2016 8:33:34 AM By: Gretta Cool, RN, BSN, Kim RN, BSN Entered By: Gretta Cool, RN, BSN, Kim on 03/20/2016 11:14:03 Wilkeson, Leslye Peer ([id number]) -------------------------------------------------------------------------------- Clinic Level of Care Assessment Details Patient Name: KHRISTIAN, PHILLIPPI C. Date of Service: 03/20/2016 11:15 AM Medical Record Number: [id number] Patient Account Number: [id number] Date of Birth/Sex: 10/14/1950 (66 y.o. Male) Treating RN: Cornell Barman Primary Care Dolan Xia: Otilio Miu Other Clinician: Referring Natalio Salois: Otilio Miu Treating Reginald Weida/Extender: Tito Dine in Treatment: 8 Clinic Level of Care Assessment Items TOOL 4 Quantity Score '[]'$  - Use when only an EandM is  performed on FOLLOW-UP visit 0 ASSESSMENTS - Nursing Assessment / Reassessment '[]'$  - Reassessment of Co-morbidities (includes updates in patient status) 0 X - Reassessment of Adherence to Treatment Plan 1 5 ASSESSMENTS - Wound and Skin Assessment / Reassessment X - Simple Wound Assessment / Reassessment - one wound 1 5 '[]'$  - Complex Wound Assessment / Reassessment - multiple wounds 0 '[]'$  - Dermatologic / Skin Assessment (not related to wound area) 0 ASSESSMENTS - Focused Assessment '[]'$  - Circumferential Edema Measurements - multi extremities 0 '[]'$  - Nutritional Assessment / Counseling / Intervention 0 '[]'$  - Lower Extremity Assessment (monofilament, tuning fork, pulses) 0 '[]'$  - Peripheral Arterial Disease Assessment (using hand held doppler) 0 ASSESSMENTS - Ostomy and/or Continence Assessment and Care '[]'$  - Incontinence Assessment and Management 0 '[]'$  - Ostomy Care Assessment and Management (repouching, etc.) 0 PROCESS - Coordination of Care X - Simple Patient / Family Education for ongoing care 1 15 '[]'$  - Complex (extensive) Patient / Family Education for ongoing care 0 X - Staff obtains Programmer, systems, Records, Test Results / Process Orders 1 10 '[]'$  - Staff telephones HHA, Nursing Homes / Clarify orders / etc 0 '[]'$  - Routine Transfer to another Facility (non-emergent condition) 0 Kage, JAQUAWN SAFFRAN ([id number]) '[]'$  - Routine Hospital Admission (non-emergent condition) 0 '[]'$  - New Admissions / Biomedical engineer / Ordering NPWT, Apligraf, etc. 0 '[]'$  - Emergency Hospital Admission (emergent condition) 0 X - Simple Discharge Coordination 1 10 '[]'$  - Complex (extensive) Discharge Coordination 0 PROCESS - Special Needs '[]'$  - Pediatric / Minor Patient Management 0 '[]'$  - Isolation Patient Management 0 '[]'$  - Hearing / Language / Visual special needs 0 '[]'$  - Assessment of Community assistance (transportation, D/C planning, etc.) 0 '[]'$  - Additional assistance / Altered mentation 0 '[]'$  - Support Surface(s) Assessment  (bed, cushion, seat, etc.) 0 INTERVENTIONS - Wound Cleansing / Measurement X - Simple Wound Cleansing - one wound 1 5 '[]'$  - Complex  Wound Cleansing - multiple wounds 0 X - Wound Imaging (photographs - any number of wounds) 1 5 '[]'$  - Wound Tracing (instead of photographs) 0 X - Simple Wound Measurement - one wound 1 5 '[]'$  - Complex Wound Measurement - multiple wounds 0 INTERVENTIONS - Wound Dressings X - Small Wound Dressing one or multiple wounds 1 10 '[]'$  - Medium Wound Dressing one or multiple wounds 0 '[]'$  - Large Wound Dressing one or multiple wounds 0 '[]'$  - Application of Medications - topical 0 '[]'$  - Application of Medications - injection 0 INTERVENTIONS - Miscellaneous '[]'$  - External ear exam 0 Flagg, Ryder C. ([id number]) '[]'$  - Specimen Collection (cultures, biopsies, blood, body fluids, etc.) 0 '[]'$  - Specimen(s) / Culture(s) sent or taken to Lab for analysis 0 '[]'$  - Patient Transfer (multiple staff / Civil Service fast streamer / Similar devices) 0 '[]'$  - Simple Staple / Suture removal (25 or less) 0 '[]'$  - Complex Staple / Suture removal (26 or more) 0 '[]'$  - Hypo / Hyperglycemic Management (close monitor of Blood Glucose) 0 '[]'$  - Ankle / Brachial Index (ABI) - do not check if billed separately 0 X - Vital Signs 1 5 Has the patient been seen at the hospital within the last three years: Yes Total Score: 75 Level Of Care: New/Established - Level 2 Electronic Signature(s) Signed: 03/21/2016 8:33:34 AM By: Gretta Cool, RN, BSN, Kim RN, BSN Entered By: Gretta Cool, RN, BSN, Kim on 03/20/2016 11:35:25 Hinton, Leslye Peer ([id number]) -------------------------------------------------------------------------------- Encounter Discharge Information Details Patient Name: Wetherell, Siyon C. Date of Service: 03/20/2016 11:15 AM Medical Record Number: [id number] Patient Account Number: [id number] Date of Birth/Sex: Jun 25, 1950 (66 y.o. Male) Treating RN: Baruch Gouty, RN, BSN, Velva Harman Primary Care Keylin Ferryman: Otilio Miu Other  Clinician: Referring Jassen Sarver: Otilio Miu Treating Alekxander Isola/Extender: Tito Dine in Treatment: 8 Encounter Discharge Information Items Discharge Pain Level: 0 Discharge Condition: Stable Ambulatory Status: Wheelchair Discharge Destination: Home Transportation: Private Auto Accompanied By: wife and son Schedule Follow-up Appointment: Yes Medication Reconciliation completed and provided to Patient/Care Yes Breslin Hemann: Provided on Clinical Summary of Care: 03/20/2016 Form Type Recipient Paper Patient [facility] Electronic Signature(s) Signed: 03/21/2016 8:33:34 AM By: Gretta Cool, RN, BSN, Kim RN, BSN Previous Signature: 03/20/2016 11:36:44 AM Version By: Ruthine Dose Entered By: Gretta Cool RN, BSN, Kim on 03/20/2016 11:37:46 Forde, Leslye Peer ([id number]) -------------------------------------------------------------------------------- Multi Wound Chart Details Patient Name: [name] Date of Service: 03/20/2016 11:15 AM Medical Record Number: [id number] Patient Account Number: [id number] Date of Birth/Sex: 05-24-1950 (66 y.o. Male) Treating RN: Cornell Barman Primary Care Montrel Donahoe: Otilio Miu Other Clinician: Referring Haly Feher: Otilio Miu Treating Tiffanee Mcnee/Extender: Ricard Dillon Weeks in Treatment: 8 Vital Signs Height(in): 70 Pulse(bpm): 92 Weight(lbs): 187 Blood Pressure 88/48 (mmHg): Body Mass Index(BMI): 27 Temperature(F): Respiratory Rate 16 (breaths/min): Photos: [1:No Photos] [N/A:N/A] Wound Location: [1:Right Back - Proximal] [N/A:N/A] Wounding Event: [1:Pressure Injury] [N/A:N/A] Primary Etiology: [1:Pressure Ulcer] [N/A:N/A] Comorbid History: [1:Coronary Artery Disease, Type II Diabetes, Quadriplegia, Seizure Disorder] [N/A:N/A] Date Acquired: [1:11/24/2015] [N/A:N/A] Weeks of Treatment: [1:8] [N/A:N/A] Wound Status: [1:Open] [N/A:N/A] Measurements L x W x D 1.8x2x0.1 [N/A:N/A] (cm) Area (cm) : [1:2.827] [N/A:N/A] Volume (cm) : [1:0.283]  [N/A:N/A] % Reduction in Area: [1:64.90%] [N/A:N/A] % Reduction in Volume: 64.80% [N/A:N/A] Classification: [1:Category/Stage II] [N/A:N/A] Exudate Amount: [1:Medium] [N/A:N/A] Exudate Type: [1:Serous] [N/A:N/A] Exudate Color: [1:amber] [N/A:N/A] Wound Margin: [1:Flat and Intact] [N/A:N/A] Granulation Amount: [1:Small (1-33%)] [N/A:N/A] Granulation Quality: [1:Red, Pink] [N/A:N/A] Necrotic Amount: [1:Medium (34-66%)] [N/A:N/A] Exposed Structures: [1:Fat Layer (Subcutaneous Tissue) Exposed: Yes Fascia: No Tendon: No Muscle: No] [  N/A:N/A] Joint: No Bone: No Epithelialization: Small (1-33%) N/A N/A Periwound Skin Texture: Excoriation: No N/A N/A Induration: No Callus: No Crepitus: No Rash: No Scarring: No Periwound Skin Maceration: No N/A N/A Moisture: Dry/Scaly: No Periwound Skin Color: Hemosiderin Staining: Yes N/A N/A Atrophie Blanche: No Cyanosis: No Ecchymosis: No Erythema: No Mottled: No Pallor: No Rubor: No Tenderness on No N/A N/A Palpation: Wound Preparation: Ulcer Cleansing: N/A N/A Rinsed/Irrigated with Saline Topical Anesthetic Applied: Other: lidocaine 4% Treatment Notes Wound #1 (Right, Proximal Back) 1. Cleansed with: Clean wound with Normal Saline 2. Anesthetic Topical Lidocaine 4% cream to wound bed prior to debridement 4. Dressing Applied: Santyl Ointment 5. Secondary Dressing Applied Bordered Foam Dressing Electronic Signature(s) Signed: 03/20/2016 4:44:13 PM By: Linton Ham MD Entered By: Linton Ham on 03/20/2016 13:16:06 Demas, Leslye Peer ([id number]) -------------------------------------------------------------------------------- [place] Details Patient Name: JAMAAL, BERNASCONI. Date of Service: 03/20/2016 11:15 AM Medical Record Number: [id number] Patient Account Number: [id number] Date of Birth/Sex: [birth year] (66 y.o. Male) Treating RN: Cornell Barman Primary Care Carnell Casamento: Otilio Miu Other  Clinician: Referring Juliza Machnik: Otilio Miu Treating Britanee Vanblarcom/Extender: Ricard Dillon Weeks in Treatment: 8 Active Inactive ` Nutrition Nursing Diagnoses: Imbalanced nutrition Impaired glucose control: actual or potential Goals: Patient/caregiver agrees to and verbalizes understanding of need to use nutritional supplements and/or vitamins as prescribed Date Initiated: 01/24/2016 Target Resolution Date: 03/27/2016 Goal Status: Active Patient/caregiver will maintain therapeutic glucose control Date Initiated: 01/24/2016 Target Resolution Date: 03/27/2016 Goal Status: Active Interventions: Assess patient nutrition upon admission and as needed per policy Provide education on elevated blood sugars and impact on wound healing Notes: ` Orientation to the Wound Care Program Nursing Diagnoses: Knowledge deficit related to the wound healing center program Goals: Patient/caregiver will verbalize understanding of the [place] Date Initiated: 01/24/2016 Target Resolution Date: 03/27/2016 Goal Status: Active Interventions: Provide education on orientation to the wound center Notes: SOHAIL, CAPRARO ([id number]) ` Pain, Acute or Chronic Nursing Diagnoses: Pain, acute or chronic: actual or potential Potential alteration in comfort, pain Goals: Patient will verbalize adequate pain control and receive pain control interventions during procedures as needed Date Initiated: 01/24/2016 Target Resolution Date: 03/27/2016 Goal Status: Active Patient/caregiver will verbalize adequate pain control between visits Date Initiated: 01/24/2016 Target Resolution Date: 03/27/2016 Goal Status: Active Patient/caregiver will verbalize comfort level met Date Initiated: 01/24/2016 Target Resolution Date: 03/27/2016 Goal Status: Active Interventions: Assess comfort goal upon admission Complete pain assessment as per visit requirements Notes: ` Pressure Nursing  Diagnoses: Knowledge deficit related to management of pressures ulcers Potential for impaired tissue integrity related to pressure, friction, moisture, and shear Goals: Patient will remain free from development of additional pressure ulcers Date Initiated: 01/24/2016 Target Resolution Date: 03/27/2016 Goal Status: Active Interventions: Assess: immobility, friction, shearing, incontinence upon admission and as needed Assess offloading mechanisms upon admission and as needed Assess potential for pressure ulcer upon admission and as needed Provide education on pressure ulcers Notes: JERIMIAH, WOLMAN ([id number]) Wound/Skin Impairment Nursing Diagnoses: Impaired tissue integrity Goals: Ulcer/skin breakdown will have a volume reduction of 30% by week 4 Date Initiated: 01/24/2016 Target Resolution Date: 03/27/2016 Goal Status: Active Ulcer/skin breakdown will have a volume reduction of 50% by week 8 Date Initiated: 01/24/2016 Target Resolution Date: 03/27/2016 Goal Status: Active Ulcer/skin breakdown will have a volume reduction of 80% by week 12 Date Initiated: 01/24/2016 Target Resolution Date: 03/27/2016 Goal Status: Active Interventions: Assess patient/caregiver ability to perform ulcer/skin care regimen upon admission and as needed Assess ulceration(s) every visit  Notes: Psychologist, prison and probation services) Signed: 03/21/2016 8:33:34 AM By: Elliot Gurney, RN, BSN, Kim RN, BSN Entered By: Elliot Gurney, RN, BSN, Kim on 03/20/2016 11:21:27 Panning, [name] ([id number]) -------------------------------------------------------------------------------- Pain Assessment Details Patient Name: JARRIS, KORTZ. Date of Service: 03/20/2016 11:15 AM Medical Record Number: [id number] Patient Account Number: [id number] Date of Birth/Sex: Jun 24, 1950 (66 y.o. Male) Treating RN: Huel Coventry Primary Care Kassity Woodson: Elizabeth Sauer Other Clinician: Referring La Shehan: Elizabeth Sauer Treating Ewelina Naves/Extender: Maxwell Caul Weeks in Treatment: 8 Active Problems Location of Pain Severity and Description of Pain Patient Has Paino No Site Locations With Dressing Change: No Pain Management and Medication Current Pain Management: Electronic Signature(s) Signed: 03/21/2016 8:33:34 AM By: Elliot Gurney, RN, BSN, Kim RN, BSN Entered By: Elliot Gurney, RN, BSN, Kim on 03/20/2016 11:14:10 Evetts, [name] ([id number]) -------------------------------------------------------------------------------- Patient/Caregiver Education Details Patient Name: [name]. Date of Service: 03/20/2016 11:15 AM Medical Record Number: [id number] Patient Account Number: [id number] Date of Birth/Gender: 1950/08/02 (66 y.o. Male) Treating RN: Huel Coventry Primary Care Physician: Elizabeth Sauer Other Clinician: Referring Physician: Elizabeth Sauer Treating Physician/Extender: Altamese [place] in Treatment: 8 Education Assessment Education Provided To: Patient and Caregiver Education Topics Provided Wound/Skin Impairment: Handouts: Caring for Your Ulcer, Other: continue wound care as prescribed Methods: Demonstration Responses: State content correctly Electronic Signature(s) Signed: 03/21/2016 8:33:34 AM By: Elliot Gurney, RN, BSN, Kim RN, BSN Entered By: Elliot Gurney, RN, BSN, Kim on 03/20/2016 11:38:07 Schlereth, [name] ([id number]) -------------------------------------------------------------------------------- Wound Assessment Details Patient Name: MEET, WEATHINGTON C. Date of Service: 03/20/2016 11:15 AM Medical Record Number: [id number] Patient Account Number: [id number] Date of Birth/Sex: [birth year] (66 y.o. Male) Treating RN: Huel Coventry Primary Care Delphine Sizemore: Elizabeth Sauer Other Clinician: Referring Chayil Gantt: Elizabeth Sauer Treating Vida Nicol/Extender: Maxwell Caul Weeks in Treatment: 8 Wound Status Wound Number: 1 Primary Pressure Ulcer Etiology: Wound Location: Right Back - Proximal Wound Open Wounding Event: Pressure  Injury Status: Date Acquired: 11/24/2015 Comorbid Coronary Artery Disease, Type II Weeks Of Treatment: 8 History: Diabetes, Quadriplegia, Seizure Clustered Wound: No Disorder Wound Measurements Length: (cm) 1.8 Width: (cm) 2 Depth: (cm) 0.1 Area: (cm) 2.827 Volume: (cm) 0.283 % Reduction in Area: 64.9% % Reduction in Volume: 64.8% Epithelialization: Small (1-33%) Tunneling: No Undermining: No Wound Description Classification: Category/Stage II Wound Margin: Flat and Intact Exudate Amount: Medium Exudate Type: Serous Exudate Color: amber Foul Odor After Cleansing: No Slough/Fibrino Yes Wound Bed Granulation Amount: Small (1-33%) Exposed Structure Granulation Quality: Red, Pink Fascia Exposed: No Necrotic Amount: Medium (34-66%) Fat Layer (Subcutaneous Tissue) Exposed: Yes Necrotic Quality: Adherent Slough Tendon Exposed: No Muscle Exposed: No Joint Exposed: No Bone Exposed: No Periwound Skin Texture Texture Color No Abnormalities Noted: No No Abnormalities Noted: No Callus: No Atrophie Blanche: No Crepitus: No Cyanosis: No Excoriation: No Ecchymosis: No Induration: No Erythema: No Belding, Clayborn C. ([id number]) Rash: No Hemosiderin Staining: Yes Scarring: No Mottled: No Pallor: No Moisture Rubor: No No Abnormalities Noted: No Dry / Scaly: No Maceration: No Wound Preparation Ulcer Cleansing: Rinsed/Irrigated with Saline Topical Anesthetic Applied: Other: lidocaine 4%, Treatment Notes Wound #1 (Right, Proximal Back) 1. Cleansed with: Clean wound with Normal Saline 2. Anesthetic Topical Lidocaine 4% cream to wound bed prior to debridement 4. Dressing Applied: Santyl Ointment 5. Secondary Dressing Applied Bordered Foam Dressing Electronic Signature(s) Signed: 03/21/2016 8:33:34 AM By: Elliot Gurney, RN, BSN, Kim RN, BSN Entered By: Elliot Gurney, RN, BSN, Kim on 03/20/2016 11:20:36 Mucci, [name]  ([id number]) -------------------------------------------------------------------------------- Vitals Details Patient Name: AIDRIC, ENDICOTT C. Date of Service: 03/20/2016 11:15 AM Medical Record Number: [id number] Patient  Account Number: [id number] Date of Birth/Sex: 11/07/1950 (66 y.o. Male) Treating RN: Cornell Barman Primary Care Drayven Marchena: Otilio Miu Other Clinician: Referring Alainah Phang: Otilio Miu Treating Nayanna Seaborn/Extender: Ricard Dillon Weeks in Treatment: 8 Vital Signs Time Taken: 11:14 Pulse (bpm): 92 Height (in): 70 Respiratory Rate (breaths/min): 16 Weight (lbs): 187 Blood Pressure (mmHg): 88/48 Body Mass Index (BMI): 26.8 Reference Range: 80 - 120 mg / dl Electronic Signature(s) Signed: 03/21/2016 8:33:34 AM By: Gretta Cool, RN, BSN, Kim RN, BSN Entered By: Gretta Cool, RN, BSN, Kim on 03/20/2016 11:16:12

## 2016-03-28 ENCOUNTER — Other Ambulatory Visit: Payer: Self-pay | Admitting: Family Medicine

## 2016-04-03 ENCOUNTER — Other Ambulatory Visit
Admission: RE | Admit: 2016-04-03 | Discharge: 2016-04-03 | Disposition: A | Discharge status: Home / Self Care | Payer: Medicare Other | Source: Ambulatory Visit | Attending: Internal Medicine | Admitting: Internal Medicine

## 2016-04-03 ENCOUNTER — Encounter: Payer: Medicare Other | Attending: Internal Medicine | Admitting: Internal Medicine

## 2016-04-03 DIAGNOSIS — L89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, stage 3: Secondary | ICD-10-CM | POA: Insufficient documentation

## 2016-04-03 DIAGNOSIS — Z7984 Long term (current) use of oral hypoglycemic drugs: Secondary | ICD-10-CM | POA: Diagnosis not present

## 2016-04-03 DIAGNOSIS — G8250 Quadriplegia, unspecified: Secondary | ICD-10-CM | POA: Diagnosis not present

## 2016-04-03 DIAGNOSIS — W19XXXS Unspecified fall, sequela: Secondary | ICD-10-CM | POA: Diagnosis not present

## 2016-04-03 DIAGNOSIS — F329 Major depressive disorder, single episode, unspecified: Secondary | ICD-10-CM | POA: Insufficient documentation

## 2016-04-03 DIAGNOSIS — E785 Hyperlipidemia, unspecified: Secondary | ICD-10-CM | POA: Insufficient documentation

## 2016-04-03 DIAGNOSIS — Z8673 Personal history of transient ischemic attack (TIA), and cerebral infarction without residual deficits: Secondary | ICD-10-CM | POA: Insufficient documentation

## 2016-04-03 DIAGNOSIS — S14103S Unspecified injury at C3 level of cervical spinal cord, sequela: Secondary | ICD-10-CM | POA: Diagnosis not present

## 2016-04-03 DIAGNOSIS — K219 Gastro-esophageal reflux disease without esophagitis: Secondary | ICD-10-CM | POA: Insufficient documentation

## 2016-04-03 DIAGNOSIS — R52 Pain, unspecified: Secondary | ICD-10-CM | POA: Insufficient documentation

## 2016-04-03 DIAGNOSIS — E119 Type 2 diabetes mellitus without complications: Secondary | ICD-10-CM | POA: Diagnosis not present

## 2016-04-03 DIAGNOSIS — R569 Unspecified convulsions: Secondary | ICD-10-CM | POA: Diagnosis not present

## 2016-04-03 DIAGNOSIS — M549 Dorsalgia, unspecified: Secondary | ICD-10-CM | POA: Diagnosis not present

## 2016-04-03 DIAGNOSIS — L89113 Pressure ulcer of right upper back, stage 3: Secondary | ICD-10-CM | POA: Diagnosis not present

## 2016-04-05 LAB — AEROBIC CULTURE W GRAM STAIN (SUPERFICIAL SPECIMEN)

## 2016-04-05 LAB — AEROBIC CULTURE  (SUPERFICIAL SPECIMEN)

## 2016-04-05 NOTE — Progress Notes (Signed)
CAMILLE, THAU ([id number]) Visit Report for 04/03/2016 Arrival Information Details Patient Name: [name], [name]. Date of Service: 04/03/2016 1:45 PM Medical Record Number: [id number] Patient Account Number: [id number] Date of Birth/Sex: 06/16/1950 (66 y.o. Male) Treating RN: Baruch Gouty, RN, BSN, Velva Harman Primary Care Airik Goodlin: Otilio Miu Other Clinician: Referring Brittay Mogle: Otilio Miu Treating Kyndel Egger/Extender: Tito Dine in Treatment: 10 Visit Information History Since Last Visit All ordered tests and consults were completed: No Patient Arrived: Wheel Chair Added or deleted any medications: No Arrival Time: 13:41 Any new allergies or adverse reactions: No Accompanied By: wife Had a fall or experienced change in No activities of daily living that may affect Transfer Assistance: None risk of falls: Patient Identification Verified: Yes Signs or symptoms of abuse/neglect since last No Secondary Verification Process Yes visito Completed: Hospitalized since last visit: No Patient Requires Transmission-Based No Has Dressing in Place as Prescribed: Yes Precautions: Pain Present Now: No Patient Has Alerts: Yes Patient Alerts: DM II Plavix Electronic Signature(s) Signed: 04/03/2016 4:49:53 PM By: Regan Lemming BSN, RN Entered By: Regan Lemming on 04/03/2016 13:41:30 Jurewicz, [name] ([id number]) -------------------------------------------------------------------------------- Encounter Discharge Information Details Patient Name: [name] Coast C. Date of Service: 04/03/2016 1:45 PM Medical Record Number: [id number] Patient Account Number: [id number] Date of Birth/Sex: 28-Dec-1950 (66 y.o. Male) Treating RN: Baruch Gouty, RN, BSN, Velva Harman Primary Care Bailee Thall: Otilio Miu Other Clinician: Referring Zayed Griffie: Otilio Miu Treating Sharanda Shinault/Extender: Tito Dine in Treatment: 10 Encounter Discharge Information Items Discharge Pain Level: 0 Discharge Condition:  Stable Ambulatory Status: Wheelchair Discharge Destination: Home Transportation: Private Auto Accompanied By: wife, son Schedule Follow-up Appointment: No Medication Reconciliation completed No and provided to Patient/Care [place]: Provided on Clinical Summary of Care: 04/03/2016 Form Type Recipient Paper Patient [facility] Electronic Signature(s) Signed: 04/03/2016 4:33:31 PM By: Regan Lemming BSN, RN Previous Signature: 04/03/2016 2:22:58 PM Version By: Ruthine Dose Entered By: Regan Lemming on 04/03/2016 16:33:31 Bieri, [name] ([id number]) -------------------------------------------------------------------------------- Lower Extremity Assessment Details Patient Name: [name], [name] Date of Service: 04/03/2016 1:45 PM Medical Record Number: [id number] Patient Account Number: [id number] Date of Birth/Sex: 12/31/1950 (66 y.o. Male) Treating RN: Baruch Gouty, RN, BSN, Velva Harman Primary Care Everlina Gotts: Otilio Miu Other Clinician: Referring Genisis Sonnier: Otilio Miu Treating Rainbow Salman/Extender: Ricard Dillon Weeks in Treatment: 10 Electronic Signature(s) Signed: 04/03/2016 4:49:53 PM By: Regan Lemming BSN, RN Entered By: Regan Lemming on 04/03/2016 14:01:45 Deuel, [name] ([id number]) -------------------------------------------------------------------------------- Multi Wound Chart Details Patient Name: [name], [name] Date of Service: 04/03/2016 1:45 PM Medical Record Number: [id number] Patient Account Number: [id number] Date of Birth/Sex: August 17, 1950 (66 y.o. Male) Treating RN: Baruch Gouty, RN, BSN, Velva Harman Primary Care Elizette Shek: Otilio Miu Other Clinician: Referring Louellen Haldeman: Otilio Miu Treating Oreste Majeed/Extender: Ricard Dillon Weeks in Treatment: 10 Vital Signs Height(in): 70 Pulse(bpm): 94 Weight(lbs): 187 Blood Pressure 81/47 (mmHg): Body Mass Index(BMI): 27 Temperature(F): 97.4 Respiratory Rate 16 (breaths/min): Photos: [1:No Photos] [N/A:N/A] Wound Location: [1:Right Back -  Proximal] [N/A:N/A] Wounding Event: [1:Pressure Injury] [N/A:N/A] Primary Etiology: [1:Pressure Ulcer] [N/A:N/A] Comorbid History: [1:Coronary Artery Disease, Type II Diabetes, Quadriplegia, Seizure Disorder] [N/A:N/A] Date Acquired: [1:11/24/2015] [N/A:N/A] Weeks of Treatment: [1:10] [N/A:N/A] Wound Status: [1:Open] [N/A:N/A] Measurements L x W x D 3x2.5x0.1 [N/A:N/A] (cm) Area (cm) : [1:5.89] [N/A:N/A] Volume (cm) : [1:0.589] [N/A:N/A] % Reduction in Area: [1:26.80%] [N/A:N/A] % Reduction in Volume: 26.80% [N/A:N/A] Classification: [1:Category/Stage II] [N/A:N/A] Exudate Amount: [1:Medium] [N/A:N/A] Exudate Type: [1:Serous] [N/A:N/A] Exudate Color: [1:amber] [N/A:N/A] Wound Margin: [1:Flat and Intact] [N/A:N/A] Granulation Amount: [1:Small (1-33%)] [N/A:N/A] Granulation Quality: [1:Red, Pink] [N/A:N/A] Necrotic Amount: [  1:Small (1-33%)] [N/A:N/A] Exposed Structures: [1:Fat Layer (Subcutaneous Tissue) Exposed: Yes Fascia: No Tendon: No Muscle: No] [N/A:N/A] Joint: No Bone: No Epithelialization: Small (1-33%) N/A N/A Debridement: Debridement ([id number]- N/A N/A 11047) Pre-procedure 14:01 N/A N/A Verification/Time Out Taken: Pain Control: Lidocaine 4% Topical N/A N/A Solution Tissue Debrided: Fibrin/Slough, N/A N/A Subcutaneous Level: Skin/Subcutaneous N/A N/A Tissue Debridement Area (sq 7.5 N/A N/A cm): Instrument: Curette N/A N/A Bleeding: Minimum N/A N/A Hemostasis Achieved: Pressure N/A N/A Procedural Pain: 0 N/A N/A Post Procedural Pain: 0 N/A N/A Debridement Treatment Procedure was tolerated N/A N/A Response: well Post Debridement 3x2.5x0.1 N/A N/A Measurements L x W x D (cm) Post Debridement 0.589 N/A N/A Volume: (cm) Post Debridement Category/Stage III N/A N/A Stage: Periwound Skin Texture: Excoriation: No N/A N/A Induration: No Callus: No Crepitus: No Rash: No Scarring: No Periwound Skin Maceration: No N/A N/A Moisture: Dry/Scaly: No Periwound  Skin Color: Erythema: Yes N/A N/A Atrophie Blanche: No Cyanosis: No Ecchymosis: No Hemosiderin Staining: No Mottled: No Pallor: No Rubor: No Erythema Location: Circumferential N/A N/A Temperature: No Abnormality N/A N/A Tenderness on No N/A N/A Palpation: Bognar, Vearl C. ([id number]) Wound Preparation: Ulcer Cleansing: N/A N/A Rinsed/Irrigated with Saline Topical Anesthetic Applied: Other: lidocaine 4% Procedures Performed: Debridement N/A N/A Treatment Notes Wound #1 (Right, Proximal Back) 1. Cleansed with: Clean wound with Normal Saline 4. Dressing Applied: Aquacel Ag 5. Secondary Dressing Applied Bordered Foam Dressing Dry Gauze Electronic Signature(s) Signed: 04/04/2016 4:28:51 PM By: Linton Ham MD Entered By: Linton Ham on 04/03/2016 14:47:20 Malphrus, [name] ([id number]) -------------------------------------------------------------------------------- Multi-Disciplinary Care Plan Details Patient Name: [name], [name] Date of Service: 04/03/2016 1:45 PM Medical Record Number: [id number] Patient Account Number: [id number] Date of Birth/Sex: 22-Jan-1951 (66 y.o. Male) Treating RN: Baruch Gouty, RN, BSN, Velva Harman Primary Care Jermani Eberlein: Otilio Miu Other Clinician: Referring Yaffa Seckman: Otilio Miu Treating Parrish Daddario/Extender: Ricard Dillon Weeks in Treatment: 10 Active Inactive ` Nutrition Nursing Diagnoses: Imbalanced nutrition Impaired glucose control: actual or potential Goals: Patient/caregiver agrees to and verbalizes understanding of need to use nutritional supplements and/or vitamins as prescribed Date Initiated: 01/24/2016 Target Resolution Date: 03/27/2016 Goal Status: Active Patient/caregiver will maintain therapeutic glucose control Date Initiated: 01/24/2016 Target Resolution Date: 03/27/2016 Goal Status: Active Interventions: Assess patient nutrition upon admission and as needed per policy Provide education on elevated blood sugars and  impact on wound healing Notes: ` Orientation to the Wound Care Program Nursing Diagnoses: Knowledge deficit related to the wound healing center program Goals: Patient/caregiver will verbalize understanding of the [place] Date Initiated: 01/24/2016 Target Resolution Date: 03/27/2016 Goal Status: Active Interventions: Provide education on orientation to the wound center Notes: RAINER, MOUNCE ([id number]) ` Pain, Acute or Chronic Nursing Diagnoses: Pain, acute or chronic: actual or potential Potential alteration in comfort, pain Goals: Patient will verbalize adequate pain control and receive pain control interventions during procedures as needed Date Initiated: 01/24/2016 Target Resolution Date: 03/27/2016 Goal Status: Active Patient/caregiver will verbalize adequate pain control between visits Date Initiated: 01/24/2016 Target Resolution Date: 03/27/2016 Goal Status: Active Patient/caregiver will verbalize comfort level met Date Initiated: 01/24/2016 Target Resolution Date: 03/27/2016 Goal Status: Active Interventions: Assess comfort goal upon admission Complete pain assessment as per visit requirements Notes: ` Pressure Nursing Diagnoses: Knowledge deficit related to management of pressures ulcers Potential for impaired tissue integrity related to pressure, friction, moisture, and shear Goals: Patient will remain free from development of additional pressure ulcers Date Initiated: 01/24/2016 Target Resolution Date: 03/27/2016 Goal Status: Active Interventions: Assess: immobility, friction, shearing, incontinence  upon admission and as needed Assess offloading mechanisms upon admission and as needed Assess potential for pressure ulcer upon admission and as needed Provide education on pressure ulcers Notes: MASSEY, RUHLAND ([id number]) Wound/Skin Impairment Nursing Diagnoses: Impaired tissue integrity Goals: Ulcer/skin breakdown will have a  volume reduction of 30% by week 4 Date Initiated: 01/24/2016 Target Resolution Date: 03/27/2016 Goal Status: Active Ulcer/skin breakdown will have a volume reduction of 50% by week 8 Date Initiated: 01/24/2016 Target Resolution Date: 03/27/2016 Goal Status: Active Ulcer/skin breakdown will have a volume reduction of 80% by week 12 Date Initiated: 01/24/2016 Target Resolution Date: 03/27/2016 Goal Status: Active Interventions: Assess patient/caregiver ability to perform ulcer/skin care regimen upon admission and as needed Assess ulceration(s) every visit Notes: Electronic Signature(s) Signed: 04/03/2016 4:49:53 PM By: Regan Lemming BSN, RN Entered By: Regan Lemming on 04/03/2016 14:01:50 [name], [name] ([id number]) -------------------------------------------------------------------------------- Pain Assessment Details Patient Name: [name] Coast C. Date of Service: 04/03/2016 1:45 PM Medical Record Number: [id number] Patient Account Number: [id number] Date of Birth/Sex: 1950/09/10 (66 y.o. Male) Treating RN: Baruch Gouty, RN, BSN, Velva Harman Primary Care Marshawn Ninneman: Otilio Miu Other Clinician: Referring Calea Hribar: Otilio Miu Treating Estee Yohe/Extender: Ricard Dillon Weeks in Treatment: 10 Active Problems Location of Pain Severity and Description of Pain Patient Has Paino Yes Site Locations Pain Location: Pain in Ulcers Pain Management and Medication Current Pain Management: Electronic Signature(s) Signed: 04/03/2016 4:49:53 PM By: Regan Lemming BSN, RN Entered By: Regan Lemming on 04/03/2016 13:41:45 [name], [name] ([id number]) -------------------------------------------------------------------------------- Patient/Caregiver Education Details Patient Name: [name]. Date of Service: 04/03/2016 1:45 PM Medical Record Number: [id number] Patient Account Number: [id number] Date of Birth/Gender: [birth year] (66 y.o. Male) Treating RN: Baruch Gouty, RN, BSN, Velva Harman Primary Care Physician: Otilio Miu Other Clinician: Referring Physician: Otilio Miu Treating Physician/Extender: Tito Dine in Treatment: 10 Education Assessment Education Provided To: Patient Education Topics Provided Elevated Blood Sugar/ Impact on Healing: Methods: Explain/Verbal Responses: State content correctly Pressure: Methods: Explain/Verbal Responses: State content correctly Welcome To [facility]: Methods: Explain/Verbal Responses: State content correctly Electronic Signature(s) Signed: 04/03/2016 4:49:53 PM By: Regan Lemming BSN, RN Entered By: Regan Lemming on 04/03/2016 16:33:54 [name], [name] ([id number]) -------------------------------------------------------------------------------- Wound Assessment Details Patient Name: [name], [name] Date of Service: 04/03/2016 1:45 PM Medical Record Number: [id number] Patient Account Number: [id number] Date of Birth/Sex: [birth year] (66 y.o. Male) Treating RN: Baruch Gouty, RN, BSN, [place] Primary Care Evey Mcmahan: Otilio Miu Other Clinician: Referring Britten Seyfried: Otilio Miu Treating Lovely Kerins/Extender: Ricard Dillon Weeks in Treatment: 10 Wound Status Wound Number: 1 Primary Pressure Ulcer Etiology: Wound Location: Right Back - Proximal Wound Open Wounding Event: Pressure Injury Status: Date Acquired: 11/24/2015 Comorbid Coronary Artery Disease, Type II Weeks Of Treatment: 10 History: Diabetes, Quadriplegia, Seizure Clustered Wound: No Disorder Photos Photo Uploaded By: Regan Lemming on 04/03/2016 16:39:10 Wound Measurements Length: (cm) 3 Width: (cm) 2.5 Depth: (cm) 0.1 Area: (cm) 5.89 Volume: (cm) 0.589 % Reduction in Area: 26.8% % Reduction in Volume: 26.8% Epithelialization: Small (1-33%) Tunneling: No Undermining: No Wound Description Classification: Category/Stage II Wound Margin: Flat and Intact Exudate Amount: Medium Exudate Type: Serous Exudate Color: amber Foul Odor After Cleansing:  No Slough/Fibrino Yes Wound Bed Granulation Amount: Small (1-33%) Exposed Structure Granulation Quality: Red, Pink Fascia Exposed: No Necrotic Amount: Small (1-33%) Fat Layer (Subcutaneous Tissue) Exposed: Yes Necrotic Quality: Adherent Slough Tendon Exposed: No [name], [name] ([id number]) Muscle Exposed: No Joint Exposed: No Bone Exposed: No Periwound Skin Texture Texture Color No Abnormalities Noted: No No Abnormalities  Noted: No Callus: No Atrophie Blanche: No Crepitus: No Cyanosis: No Excoriation: No Ecchymosis: No Induration: No Erythema: Yes Rash: No Erythema Location: Circumferential Scarring: No Hemosiderin Staining: No Mottled: No Moisture Pallor: No No Abnormalities Noted: No Rubor: No Dry / Scaly: No Maceration: No Temperature / Pain Temperature: No Abnormality Wound Preparation Ulcer Cleansing: Rinsed/Irrigated with Saline Topical Anesthetic Applied: Other: lidocaine 4%, Treatment Notes Wound #1 (Right, Proximal Back) 1. Cleansed with: Clean wound with Normal Saline 4. Dressing Applied: Aquacel Ag 5. Secondary Dressing Applied Bordered Foam Dressing Dry Gauze Electronic Signature(s) Signed: 04/03/2016 4:49:53 PM By: Regan Lemming BSN, RN Entered By: Regan Lemming on 04/03/2016 14:01:36 [name], [name] ([id number]) -------------------------------------------------------------------------------- [place] Details Patient Name: [name] Coast C. Date of Service: 04/03/2016 1:45 PM Medical Record Number: [id number] Patient Account Number: [id number] Date of Birth/Sex: 1950-03-06 (66 y.o. Male) Treating RN: Baruch Gouty, RN, BSN, [place] Primary Care Janie Capp: Otilio Miu Other Clinician: Referring Kasidy Gianino: Otilio Miu Treating Kahlani Graber/Extender: Ricard Dillon Weeks in Treatment: 10 Vital Signs Time Taken: 13:44 Temperature (F): 97.4 Height (in): 70 Pulse (bpm): 94 Weight (lbs): 187 Respiratory Rate (breaths/min): 16 Body Mass Index (BMI):  26.8 Blood Pressure (mmHg): 81/47 Reference Range: 80 - 120 mg / dl Electronic Signature(s) Signed: 04/03/2016 4:49:53 PM By: Regan Lemming BSN, RN Entered By: Regan Lemming on 04/03/2016 13:44:31

## 2016-04-05 NOTE — Progress Notes (Signed)
MINOR, IDEN ([id number]) Visit Report for 04/03/2016 Chief Complaint Document Details Patient Name: [name], [name]. Date of Service: 04/03/2016 1:45 PM Medical Record Number: [id number] Patient Account Number: [id number] Date of Birth/Sex: December 20, 1950 (66 y.o. Male) Treating RN: Clover Mealy, RN, BSN, [place] Sink Primary Care Provider: Elizabeth Sauer Other Clinician: Referring Provider: Elizabeth Sauer Treating Provider/Extender: Maxwell Caul Weeks in Treatment: 10 Information Obtained from: Patient Chief Complaint 01/24/16 patient arrives today for review of a pressure ulcer on his right scapula in the setting of incomplete C3-C4 quadriplegia Electronic Signature(s) Signed: 04/04/2016 4:28:51 PM By: Baltazar Najjar MD Entered By: Baltazar Najjar on 04/03/2016 14:48:02 [name] ([id number]) -------------------------------------------------------------------------------- Debridement Details Patient Name: [name] Bras C. Date of Service: 04/03/2016 1:45 PM Medical Record Number: [id number] Patient Account Number: [id number] Date of Birth/Sex: August 30, 1950 (66 y.o. Male) (66 y.o. Male) Treating RN: Afful, RN, BSN, Rita Primary Care Provider: Elizabeth Sauer Other Clinician: Referring Provider: Elizabeth Sauer Treating Provider/Extender: Altamese [place] in Treatment: 10 Debridement Performed for Wound #1 Right,Proximal Back Assessment: Performed By: Physician Maxwell Caul, MD Debridement: Debridement Pre-procedure Yes - 14:01 Verification/Time Out Taken: Start Time: 14:01 Pain Control: Lidocaine 4% Topical Solution Level: Skin/Subcutaneous Tissue Total Area Debrided (L x 3 (cm) x 2.5 (cm) = 7.5 (cm) W): Tissue and other Non-Viable, Fibrin/Slough, Subcutaneous material debrided: Instrument: Curette Bleeding: Minimum Hemostasis Achieved: Pressure End Time: 14:05 Procedural Pain: 0 Post Procedural Pain: 0 Response to Treatment: Procedure was tolerated well Post Debridement  Measurements of Total Wound Length: (cm) 3 Stage: Category/Stage III Width: (cm) 2.5 Depth: (cm) 0.1 Volume: (cm) 0.589 Character of Wound/Ulcer Post Requires Further Debridement: Debridement Severity of Tissue Post Fat layer exposed Debridement: Post Procedure Diagnosis Same as Pre-procedure Electronic Signature(s) Signed: 04/03/2016 4:49:53 PM By: Elpidio Eric BSN, RN Signed: 04/04/2016 4:28:51 PM By: Baltazar Najjar MD Entered By: Elpidio Eric on 04/03/2016 14:03:43 [name] ([id number]) [name] ([id number]) -------------------------------------------------------------------------------- HPI Details Patient Name: [name], [name] Date of Service: 04/03/2016 1:45 PM Medical Record Number: [id number] Patient Account Number: [id number] Date of Birth/Sex: 1950/05/08 (66 y.o. 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 He has lower extremity sensation and can move his legs but has no/limited control over his arms. His wife accompanies him today and states that in the late spring or early summer of 2017 the patient became very depressed. He refused the refused to mobilize and he developed several pressure ulcers on his back. Most of these have healed however they have a recalcitrant area over the right scapula. They've been using Santyl on this for at least the last month. In terms of depression the patient is doing better now on an antidepressant. He saw his primary physician on 01/12/16 at which time there was apparently green drainage coming out of this area [Dr. Deana Jones]. Dr. Yetta Barre works in the [facility]. He has completed this Septra. Otherwise looking through [facility] notes that he has a history of seizures. He also had a stroke in 2008 he follows with neurology. He also has type 2 diabetes on Glucophage, hyperlipidemia and gastroesophageal reflux. He takes Plavix for stroke prevention and Keppra for seizure prophylaxis. 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 02/08/16; this is a patient with a pressure ulcer over the right scapula. His wife has been doing the dressing with Santyl and border foam change every second day. When he arrived here 2 weeks ago we did a fairly extensive mechanical debridement. We are asked medical modalities to go out to the home and see what they might be eligible for in terms of pressure-relief surfaces [level 2] but the wife states that they have not heard from them. 03/20/15; this is a patient we haven't seen in 5-6 weeks. This was largely due to transportation issues. They've been using Santyl and border foam changing every second day for a pressure injury over the right scapula. The patient has a C3-C4 spinal injury. We had also asked for a review by the people who supplied DME to look at his wheelchair cushion, mattress etc. I don't know that this ever happened. In the meantime the wound has done remarkably well current measurements 1.8 x 2 x 0.1 04/03/16; the patient's dimensions have gone up to 3 cm in diameter quite a deterioration from last time. He is also complaining of pain in this area which is new. Electronic Signature(s) Signed: 04/04/2016 4:28:51 PM By: Baltazar Najjar MD Entered By: Baltazar Najjar on 04/03/2016 14:49:36 [name] ([id number]) -------------------------------------------------------------------------------- Physical Exam Details Patient Name: [name], [name] Date of Service: 04/03/2016 1:45 PM Medical Record Number: [id number] Patient Account  Number: [id number] Date of Birth/Sex: 10-07-1950 (66 y.o. Male) Treating RN: Clover Mealy, RN, BSN, [place] Sink Primary Care Provider: Elizabeth Sauer Other Clinician: Referring Provider: Elizabeth Sauer Treating Provider/Extender: Maxwell Caul Weeks in Treatment: 10 Constitutional Patient is hypotensive.. Pulse regular and within target range for patient.Marland Kitchen Respirations regular, non-labored and within target range.. Temperature is normal and within the target range for the patient.. Patient's appearance is neat and clean. Appears in no acute distress. Well nourished and well developed.. Notes On exam; the area in question over the right scapular medial border. This is much larger wound last time I saw him 2 weeks ago. At least 75% of the wound area covered in a surface slough which required debridement with a curette was more depth more tenderness and he is closer to exposed bone. There is no surrounding erythema. Electronic Signature(s) Signed: 04/04/2016 4:28:51 PM By: Baltazar Najjar MD Entered By: Baltazar Najjar on 04/03/2016 14:51:35 [place], Fanny [name] ([id number]) -------------------------------------------------------------------------------- Physician Orders Details Patient Name: [name], [name] Date of Service: 04/03/2016 1:45 PM Medical Record Number: [id number] Patient Account Number: [id number] Date of Birth/Sex: 25-Dec-1950 (66 y.o. Male) Treating RN: Clover Mealy, RN, BSN, [place] Sink Primary Care Provider: Elizabeth Sauer Other Clinician: Referring Provider: Elizabeth Sauer Treating Provider/Extender: Altamese [place] in Treatment: 10 Verbal / Phone Orders: No Diagnosis Coding Wound Cleansing Wound #1 Right,Proximal Back o Clean wound with Normal Saline. o Cleanse wound with mild soap and water Anesthetic Wound #1 Right,Proximal Back o Topical Lidocaine 4% cream applied to wound bed prior to debridement - CLINIC USE Skin Barriers/Peri-Wound Care Wound #1 Right,Proximal Back o Skin  Prep Primary Wound Dressing Wound #1 Right,Proximal Back o Aquacel Ag Secondary Dressing Wound #1 Right,Proximal Back o Dry Gauze o Boardered Foam Dressing Dressing Change Frequency Wound #1 Right,Proximal Back o Change dressing every other day. Follow-up Appointments Wound #1 Right,Proximal Back o Return Appointment in 2 weeks. Off-Loading Wound #1 Right,Proximal Back o Mattress [employer] does not cover because they out of network. Patient  wife to call the insurance company to find out with DME company is in network. o Turn and reposition every 2 hours Exline, Keivon C. ([id number]) o Other: - Pad for wheelchair for positioning FELT USE TODAY IN THE CLINIC Additional Orders / Instructions Wound #1 Right,Proximal Back o Increase protein intake. Medications-please add to medication list. Wound #1 Right,Proximal Back o Other: - VITAMIN C, ZINC, VIt A, MVI Laboratory o Bacteria identified in Wound by Culture (MICRO) - right upper shoulder oooo LOINC Code: 6462-6 oooo Convenience Name: Wound culture routine Patient Medications Allergies: NKDA Notifications Medication Indication Start End Augmentin 04/03/2016 DOSE bid - oral 875 mg-125 mg tablet - tablet oral Electronic Signature(s) Signed: 04/03/2016 2:56:39 PM By: Baltazar Najjar MD Entered By: Baltazar Najjar on 04/03/2016 14:56:38 Ritchey, Fanny [name] ([id number]) -------------------------------------------------------------------------------- Problem List Details Patient Name: [name] Bras C. Date of Service: 04/03/2016 1:45 PM Medical Record Number: [id number] Patient Account Number: [id number] Date of Birth/Sex: [birth year] (66 y.o. Male) Treating RN: Clover Mealy, RN, BSN, [place] Sink Primary Care Provider: Elizabeth Sauer Other Clinician: Referring Provider: Elizabeth Sauer Treating Provider/Extender: Maxwell Caul Weeks in Treatment: 10 Active Problems ICD-10 Encounter Code Description Active  Date Diagnosis 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, stage 3 01/24/2016 Yes S14.103S Unspecified injury at C3 level of cervical spinal cord, 01/24/2016 Yes sequela Inactive Problems Resolved Problems Electronic Signature(s) Signed: 04/04/2016 4:28:51 PM By: Baltazar Najjar MD Entered By: Baltazar Najjar on 04/03/2016 14:47:07 Pintor, Fanny [name] ([id number]) -------------------------------------------------------------------------------- Progress Note Details Patient Name: [name] Bras C. Date of Service: 04/03/2016 1:45 PM Medical Record Number: [id number] Patient Account Number: [id number] Date of Birth/Sex: 12-25-1950 (66 y.o. 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 He has lower extremity sensation and can move his legs but has no/limited control over his arms. His wife accompanies him today and states that in the late spring or early summer of 2017 the patient became very depressed. He refused the refused to mobilize and he developed several pressure ulcers on his back. Most of these have healed however they have a recalcitrant area over the right scapula. They've been using Santyl on this for at least the last month. In terms of depression the patient is doing better now on an antidepressant. He saw his primary physician on 01/12/16 at which time there was apparently green drainage coming out of this area [Dr. Deana Jones]. Dr. Yetta Barre works in the [facility]. He has completed this Septra. Otherwise looking [facility] notes that he has a history of seizures. He also had a stroke in 2008 he follows with neurology. He also has type 2 diabetes on Glucophage, hyperlipidemia and gastroesophageal reflux. He takes Plavix for stroke prevention and Keppra for seizure prophylaxis. 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 02/08/16; this is a patient with a pressure ulcer over the right scapula. His wife has been doing the dressing with Santyl and border foam change every second day. When he arrived here 2  weeks ago we did a fairly extensive mechanical debridement. We are asked medical modalities to go out to the home and see what they might be eligible for in terms of pressure-relief surfaces [level 2] but the wife states that they have not heard from them. 03/20/15; this is a patient we haven't seen in 5-6 weeks. This was largely due to transportation issues. They've been using Santyl and border foam changing every second day for a pressure injury over the right scapula. The patient has a C3-C4 spinal injury. We had also asked for a review by the people who supplied DME to look at his wheelchair cushion, mattress etc. I don't know that this ever happened. In the meantime the wound has done remarkably well current measurements 1.8 x 2 x 0.1 04/03/16; the patient's dimensions have gone up to 3 cm in diameter quite a deterioration from last time. He is also complaining of pain in this area which is new. [name], [name] ([id number]) Objective Constitutional Patient is hypotensive.. Pulse regular and within target range for patient.Marland Kitchen. Respirations regular, non-labored and within target range.. Temperature is normal and within the target range for the patient.. Patient's appearance is neat and clean. Appears in no acute distress. Well nourished and well  developed.. Vitals Time Taken: 1:44 PM, Height: 70 in, Weight: 187 lbs, BMI: 26.8, Temperature: 97.4 F, Pulse: 94 bpm, Respiratory Rate: 16 breaths/min, Blood Pressure: 81/47 mmHg. General Notes: On exam; the area in question over the right scapular medial border. This is much larger wound last time I saw him 2 weeks ago. At least 75% of the wound area covered in a surface slough which required debridement with a curette was more depth more tenderness and he is closer to exposed bone. There is no surrounding erythema. Integumentary (Hair, Skin) Wound #1 status is Open. Original cause of wound was Pressure Injury. The wound is located on the Right,Proximal Back. The wound measures 3cm length x 2.5cm width x 0.1cm depth; 5.89cm^2 area and 0.589cm^3 volume. There is Fat Layer (Subcutaneous Tissue) Exposed exposed. There is no tunneling or undermining noted. There is a medium amount of serous drainage noted. The wound margin is flat and intact. There is small (1-33%) red, pink granulation within the wound bed. There is a small (1-33%) amount of necrotic tissue within the wound bed including Adherent Slough. The periwound skin appearance exhibited: Erythema. The periwound skin appearance did not exhibit: Callus, Crepitus, Excoriation, Induration, Rash, Scarring, Dry/Scaly, Maceration, Atrophie Blanche, Cyanosis, Ecchymosis, Hemosiderin Staining, Mottled, Pallor, Rubor. The surrounding wound skin color is noted with erythema which is circumferential. Periwound temperature was noted as No Abnormality. 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, stage 3 S14.103S - Unspecified injury at C3 level of cervical spinal cord, sequela Procedures Wound #1 [name], [name] ([id number]) Wound #1 is a Pressure Ulcer located on the Right,Proximal Back . There was a Skin/Subcutaneous Tissue Debridement ([id number]) debridement with total area of 7.5 sq cm performed by Maxwell CaulOBSON,  Amel Gianino G, MD. with the following instrument(s): Curette to remove Non-Viable tissue/material including Fibrin/Slough and Subcutaneous after achieving pain control using Lidocaine 4% Topical Solution. A time out was conducted at 14:01, prior to the start of the procedure. A Minimum amount of bleeding was controlled with Pressure. The procedure was tolerated well with a pain level of 0 throughout and a pain level of 0 following the procedure. Post Debridement Measurements: 3cm length x 2.5cm width x 0.1cm depth; 0.589cm^3 volume. Post debridement Stage noted  as Category/Stage III. Character of Wound/Ulcer Post Debridement requires further debridement. Severity of Tissue Post Debridement is: Fat layer exposed. Post procedure Diagnosis Wound #1: Same as Pre-Procedure Plan Wound Cleansing: Wound #1 Right,Proximal Back: Clean wound with Normal Saline. Cleanse wound with mild soap and water Anesthetic: Wound #1 Right,Proximal Back: Topical Lidocaine 4% cream applied to wound bed prior to debridement - CLINIC USE Skin Barriers/Peri-Wound Care: Wound #1 Right,Proximal Back: Skin Prep Primary Wound Dressing: Wound #1 Right,Proximal Back: Aquacel Ag Secondary Dressing: Wound #1 Right,Proximal Back: Dry Gauze Boardered Foam Dressing Dressing Change Frequency: Wound #1 Right,Proximal Back: Change dressing every other day. Follow-up Appointments: Wound #1 Right,Proximal Back: Return Appointment in 2 weeks. Off-Loading: Wound #1 Right,Proximal Back: Matt[employer] does not cover because they out of network. Patient wife to call the insurance company to find out with DME company is in network. Turn and reposition every 2 hours Other: - Pad for wheelchair for positioning FELT USE TODAY IN THE CLINIC Additional Orders / Instructions: Wound #1 Right,Proximal Back: [name], [name] ([id number]) Increase protein intake. Medications-please add to medication list.: Wound #1  Right,Proximal Back: Other: - VITAMIN C, ZINC, VIt A, MVI Laboratory ordered were: Wound culture routine - right upper shoulder The following medication(s) was prescribed: Augmentin oral 875 mg-125 mg tablet bid tablet oral starting 04/03/2016 #11 to give this patient empiric antibiotics while we wait for culture of the surface of this. I was concerned about the deterioration complaints of pain #2 change the primary dressing to silver alginate from Santyl. Electronic Signature(s) Signed: 04/03/2016 2:57:23 PM By: Baltazar Najjar MD Entered By: Baltazar Najjar on 04/03/2016 14:57:23 Hupp, Fanny [name] ([id number]) -------------------------------------------------------------------------------- SuperBill Details Patient Name: [name] Bras C. Date of Service: 04/03/2016 Medical Record Number: [id number] Patient Account Number: [id number] Date of Birth/Sex: 27-Nov-1950 (66 y.o. Male) Treating RN: Clover Mealy, RN, BSN, [place] Sink Primary Care Provider: Elizabeth Sauer Other Clinician: Referring Provider: Elizabeth Sauer Treating Provider/Extender: Maxwell Caul Weeks in Treatment: 10 Diagnosis Coding ICD-10 Codes Code Description [phone] Pressure ulcer of other site, stage 3 S14.103S Unspecified injury at C3 level of cervical spinal cord, sequela Facility Procedures CPT4 Code: [id number] Description: 11042 - DEB SUBQ TISSUE 20 SQ CM/< ICD-10 Description Diagnosis 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, stage 3 Modifier: Quantity: 1 Physician Procedures CPT4 Code: [id number] Description: 11042 - WC PHYS SUBQ TISS 20 SQ CM ICD-10 Description Diagnosis 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, stage 3 Modifier: Quantity: 1 Electronic Signature(s) Signed: 04/04/2016 4:28:51 PM By: Baltazar Najjar MD Entered By: Baltazar Najjar on 04/03/2016 15:00:43

## 2016-04-10 ENCOUNTER — Encounter: Payer: Medicare Other | Admitting: Internal Medicine

## 2016-04-10 DIAGNOSIS — E785 Hyperlipidemia, unspecified: Secondary | ICD-10-CM | POA: Diagnosis not present

## 2016-04-10 DIAGNOSIS — L89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, stage 3: Secondary | ICD-10-CM | POA: Diagnosis not present

## 2016-04-10 DIAGNOSIS — G8250 Quadriplegia, unspecified: Secondary | ICD-10-CM | POA: Diagnosis not present

## 2016-04-10 DIAGNOSIS — K219 Gastro-esophageal reflux disease without esophagitis: Secondary | ICD-10-CM | POA: Diagnosis not present

## 2016-04-10 DIAGNOSIS — R569 Unspecified convulsions: Secondary | ICD-10-CM | POA: Diagnosis not present

## 2016-04-10 DIAGNOSIS — E119 Type 2 diabetes mellitus without complications: Secondary | ICD-10-CM | POA: Diagnosis not present

## 2016-04-10 DIAGNOSIS — Z7984 Long term (current) use of oral hypoglycemic drugs: Secondary | ICD-10-CM | POA: Diagnosis not present

## 2016-04-10 DIAGNOSIS — L89113 Pressure ulcer of right upper back, stage 3: Secondary | ICD-10-CM | POA: Diagnosis not present

## 2016-04-10 DIAGNOSIS — S14103S Unspecified injury at C3 level of cervical spinal cord, sequela: Secondary | ICD-10-CM | POA: Diagnosis not present

## 2016-04-10 DIAGNOSIS — Z8673 Personal history of transient ischemic attack (TIA), and cerebral infarction without residual deficits: Secondary | ICD-10-CM | POA: Diagnosis not present

## 2016-04-11 NOTE — Progress Notes (Addendum)
[name], [name] ([id number]) Visit Report for 04/10/2016 Arrival Information Details Patient Name: [name], [name]. Date of Service: 04/10/2016 3:30 PM Medical Record Number: [id number] Patient Account Number: [id number] Date of Birth/Sex: 1950/03/12 (66 y.o. Male) Treating RN: Cornell Barman Primary Care Camil Hausmann: Otilio Miu Other Clinician: Referring Arleigh Odowd: Otilio Miu Treating Chaos Carlile/Extender: Tito Dine in Treatment: 11 Visit Information History Since Last Visit Added or deleted any medications: No Patient Arrived: Wheel Chair Any new allergies or adverse reactions: No Arrival Time: 15:33 Had a fall or experienced change in No Accompanied By: two activities of daily living that may affect caregivers risk of falls: Transfer Assistance: None Signs or symptoms of abuse/neglect since last No Patient Identification Verified: Yes visito Secondary Verification Process Yes Has Dressing in Place as Prescribed: Yes Completed: Pain Present Now: No Patient Requires Transmission-Based No Precautions: Patient Has Alerts: Yes Patient Alerts: DM II Plavix Electronic Signature(s) Signed: 04/10/2016 4:41:51 PM By: Gretta Cool, RN, BSN, Kim RN, BSN Entered By: Gretta Cool, RN, BSN, Kim on 04/10/2016 15:36:20 Kaucher, [name] ([id number]) -------------------------------------------------------------------------------- Encounter Discharge Information Details Patient Name: [name] Coast C. Date of Service: 04/10/2016 3:30 PM Medical Record Number: [id number] Patient Account Number: [id number] Date of Birth/Sex: 1950-10-17 (66 y.o. Male) Treating RN: Baruch Gouty, RN, BSN, Velva Harman Primary Care Elmo Rio: Otilio Miu Other Clinician: Referring Cidney Kirkwood: Otilio Miu Treating Kjell Brannen/Extender: Tito Dine in Treatment: 11 Encounter Discharge Information Items Discharge Pain Level: 0 Discharge Condition: Stable Ambulatory Status: Wheelchair Discharge Destination:  Home Transportation: Private Auto Accompanied By: two caregivers Schedule Follow-up Appointment: Yes Medication Reconciliation completed and provided to Patient/Care Yes Devlin Brink: Provided on Clinical Summary of Care: 04/10/2016 Form Type Recipient Paper [facility] Electronic Signature(s) Signed: 04/10/2016 4:41:51 PM By: Gretta Cool RN, BSN, Kim RN, BSN Previous Signature: 04/10/2016 4:14:19 PM Version By: Ruthine Dose Entered By: Gretta Cool RN, BSN, Kim on 04/10/2016 16:14:42 [place], [name] ([id number]) -------------------------------------------------------------------------------- Multi Wound Chart Details Patient Name: [name] Coast C. Date of Service: 04/10/2016 3:30 PM Medical Record Number: [id number] Patient Account Number: [id number] Date of Birth/Sex: Apr 04, 1950 (66 y.o. Male) Treating RN: Cornell Barman Primary Care Koren Plyler: Otilio Miu Other Clinician: Referring Danyah Guastella: Otilio Miu Treating Karrah Mangini/Extender: Ricard Dillon Weeks in Treatment: 11 Vital Signs Height(in): 70 Pulse(bpm): 79 Weight(lbs): 187 Blood Pressure 117/59 (mmHg): Body Mass Index(BMI): 27 Temperature(F): Respiratory Rate 16 (breaths/min): Photos: [N/A:N/A] Wound Location: Right Back - Proximal N/A N/A Wounding Event: Pressure Injury N/A N/A Primary Etiology: Pressure Ulcer N/A N/A Comorbid History: Coronary Artery Disease, N/A N/A Type II Diabetes, Quadriplegia, Seizure Disorder Date Acquired: 11/24/2015 N/A N/A Weeks of Treatment: 11 N/A N/A Wound Status: Open N/A N/A Measurements L x W x D 3x2.5x0.1 N/A N/A (cm) Area (cm) : 5.89 N/A N/A Volume (cm) : 0.589 N/A N/A % Reduction in Area: 26.80% N/A N/A % Reduction in Volume: 26.80% N/A N/A Classification: Category/Stage II N/A N/A Exudate Amount: Medium N/A N/A Exudate Type: Serous N/A N/A Exudate Color: amber N/A N/A Wound Margin: Flat and Intact N/A N/A Granulation Amount: Medium (34-66%) N/A N/A Granulation Quality:  Red, Pink N/A N/A Necrotic Amount: Medium (34-66%) N/A N/A Vancleve, [name] ([id number]) Exposed Structures: Fat Layer (Subcutaneous N/A N/A Tissue) Exposed: Yes Fascia: No Tendon: No Muscle: No Joint: No Bone: No Epithelialization: Small (1-33%) N/A N/A Debridement: Debridement ([id number]- N/A N/A 11047) Pre-procedure 15:55 N/A N/A Verification/Time Out Taken: Pain Control: Other N/A N/A Tissue Debrided: Fibrin/Slough, N/A N/A Subcutaneous Level: Skin/Subcutaneous N/A N/A Tissue Debridement Area (sq 7.5 N/A N/A cm): Instrument: Curette  N/A N/A Bleeding: Moderate N/A N/A Hemostasis Achieved: Silver Nitrate N/A N/A Procedural Pain: 0 N/A N/A Post Procedural Pain: 0 N/A N/A Debridement Treatment Procedure was tolerated N/A N/A Response: well Post Debridement 3x2.5x0.3 N/A N/A Measurements L x W x D (cm) Post Debridement 1.767 N/A N/A Volume: (cm) Post Debridement Category/Stage II N/A N/A Stage: Periwound Skin Texture: Excoriation: Yes N/A N/A Induration: No Callus: No Crepitus: No Rash: No Scarring: No Periwound Skin Maceration: No N/A N/A Moisture: Dry/Scaly: No Periwound Skin Color: Erythema: Yes N/A N/A Atrophie Blanche: No Cyanosis: No Ecchymosis: No Hemosiderin Staining: No Mottled: No Pallor: No Rubor: No Espina, [name] ([id number]) Erythema Location: Circumferential N/A N/A Temperature: No Abnormality N/A N/A Tenderness on No N/A N/A Palpation: Wound Preparation: Ulcer Cleansing: N/A N/A Rinsed/Irrigated with Saline Topical Anesthetic Applied: Other: lidocaine 4% Procedures Performed: Debridement N/A N/A Treatment Notes Wound #1 (Right, Proximal Back) 1. Cleansed with: Clean wound with Normal Saline 2. Anesthetic Topical Lidocaine 4% cream to wound bed prior to debridement 3. Peri-wound Care: Skin Prep 4. Dressing Applied: Aquacel Ag 5. Secondary Dressing Applied Bordered Foam Dressing Notes Felt surrounding wound Electronic  Signature(s) Signed: 04/11/2016 7:47:19 AM By: Linton Ham MD Previous Signature: 04/10/2016 4:41:51 PM Version By: Gretta Cool RN, BSN, Kim RN, BSN Entered By: Linton Ham on 04/11/2016 07:40:49 [name], [name] ([id number]) -------------------------------------------------------------------------------- Multi-Disciplinary Care Plan Details Patient Name: PERSEUS, WESTALL. Date of Service: 04/10/2016 3:30 PM Medical Record Number: [id number] Patient Account Number: [id number] Date of Birth/Sex: 1950-11-03 (66 y.o. Male) Treating RN: Cornell Barman Primary Care Laniyah Rosenwald: Otilio Miu Other Clinician: Referring Vencil Basnett: Otilio Miu Treating Tezra Mahr/Extender: Ricard Dillon Weeks in Treatment: 11 Active Inactive ` Nutrition Nursing Diagnoses: Imbalanced nutrition Impaired glucose control: actual or potential Goals: Patient/caregiver agrees to and verbalizes understanding of need to use nutritional supplements and/or vitamins as prescribed Date Initiated: 01/24/2016 Target Resolution Date: 03/27/2016 Goal Status: Active Patient/caregiver will maintain therapeutic glucose control Date Initiated: 01/24/2016 Target Resolution Date: 03/27/2016 Goal Status: Active Interventions: Assess patient nutrition upon admission and as needed per policy Provide education on elevated blood sugars and impact on wound healing Notes: ` Orientation to the Wound Care Program Nursing Diagnoses: Knowledge deficit related to the wound healing center program Goals: Patient/caregiver will verbalize understanding of the [place] Date Initiated: 01/24/2016 Target Resolution Date: 03/27/2016 Goal Status: Active Interventions: Provide education on orientation to the wound center Notes: KERIN, CECCHI ([id number]) ` Pain, Acute or Chronic Nursing Diagnoses: Pain, acute or chronic: actual or potential Potential alteration in comfort, pain Goals: Patient will verbalize adequate  pain control and receive pain control interventions during procedures as needed Date Initiated: 01/24/2016 Target Resolution Date: 03/27/2016 Goal Status: Active Patient/caregiver will verbalize adequate pain control between visits Date Initiated: 01/24/2016 Target Resolution Date: 03/27/2016 Goal Status: Active Patient/caregiver will verbalize comfort level met Date Initiated: 01/24/2016 Target Resolution Date: 03/27/2016 Goal Status: Active Interventions: Assess comfort goal upon admission Complete pain assessment as per visit requirements Notes: ` Pressure Nursing Diagnoses: Knowledge deficit related to management of pressures ulcers Potential for impaired tissue integrity related to pressure, friction, moisture, and shear Goals: Patient will remain free from development of additional pressure ulcers Date Initiated: 01/24/2016 Target Resolution Date: 03/27/2016 Goal Status: Active Interventions: Assess: immobility, friction, shearing, incontinence upon admission and as needed Assess offloading mechanisms upon admission and as needed Assess potential for pressure ulcer upon admission and as needed Provide education on pressure ulcers Notes: [name], [name] ([id number]) Wound/Skin Impairment Nursing  Diagnoses: Impaired tissue integrity Goals: Ulcer/skin breakdown will have a volume reduction of 30% by week 4 Date Initiated: 01/24/2016 Target Resolution Date: 03/27/2016 Goal Status: Active Ulcer/skin breakdown will have a volume reduction of 50% by week 8 Date Initiated: 01/24/2016 Target Resolution Date: 03/27/2016 Goal Status: Active Ulcer/skin breakdown will have a volume reduction of 80% by week 12 Date Initiated: 01/24/2016 Target Resolution Date: 03/27/2016 Goal Status: Active Interventions: Assess patient/caregiver ability to perform ulcer/skin care regimen upon admission and as needed Assess ulceration(s) every visit Notes: Electronic Signature(s) Signed:  04/10/2016 4:41:51 PM By: Gretta Cool, RN, BSN, Kim RN, BSN Entered By: Gretta Cool, RN, BSN, Kim on 04/10/2016 15:45:25 [name], [name] ([id number]) -------------------------------------------------------------------------------- Pain Assessment Details Patient Name: [name] Coast C. Date of Service: 04/10/2016 3:30 PM Medical Record Number: [id number] Patient Account Number: [id number] Date of Birth/Sex: November 29, 1950 (66 y.o. Male) Treating RN: Cornell Barman Primary Care Luisfernando Brightwell: Otilio Miu Other Clinician: Referring Jayshaun Phillips: Otilio Miu Treating Jameison Haji/Extender: Ricard Dillon Weeks in Treatment: 11 Active Problems Location of Pain Severity and Description of Pain Patient Has Paino No Site Locations With Dressing Change: No Pain Management and Medication Current Pain Management: Electronic Signature(s) Signed: 04/10/2016 4:41:51 PM By: Gretta Cool, RN, BSN, Kim RN, BSN Entered By: Gretta Cool, RN, BSN, Kim on 04/10/2016 15:36:27 [name], [name] ([id number]) -------------------------------------------------------------------------------- Patient/Caregiver Education Details Patient Name: [name] Ship. Date of Service: 04/10/2016 3:30 PM Medical Record Number: [id number] Patient Account Number: [id number] Date of Birth/Gender: 10/08/1950 (66 y.o. Male) Treating RN: Cornell Barman Primary Care Physician: Otilio Miu Other Clinician: Referring Physician: Otilio Miu Treating Physician/Extender: Tito Dine in Treatment: 11 Education Assessment Education Provided To: Patient Education Topics Provided Pressure: Handouts: Pressure Ulcers: Care and Offloading Methods: Demonstration, Explain/Verbal Responses: State content correctly Electronic Signature(s) Signed: 04/10/2016 4:41:51 PM By: Gretta Cool, RN, BSN, Kim RN, BSN Entered By: Gretta Cool, RN, BSN, Kim on 04/10/2016 16:14:56 [name], [name]  ([id number]) -------------------------------------------------------------------------------- Wound Assessment Details Patient Name: [name], [name] Date of Service: 04/10/2016 3:30 PM Medical Record Number: [id number] Patient Account Number: [id number] Date of Birth/Sex: 1950/05/02 (66 y.o. Male) Treating RN: Cornell Barman Primary Care Maricia Scotti: Otilio Miu Other Clinician: Referring Chaddrick Brue: Otilio Miu Treating Sorin Frimpong/Extender: Ricard Dillon Weeks in Treatment: 11 Wound Status Wound Number: 1 Primary Pressure Ulcer Etiology: Wound Location: Right Back - Proximal Wound Open Wounding Event: Pressure Injury Status: Date Acquired: 11/24/2015 Comorbid Coronary Artery Disease, Type II Weeks Of Treatment: 11 History: Diabetes, Quadriplegia, Seizure Clustered Wound: No Disorder Photos Wound Measurements Length: (cm) 3 Width: (cm) 2.5 Depth: (cm) 0.1 Area: (cm) 5.89 Volume: (cm) 0.589 % Reduction in Area: 26.8% % Reduction in Volume: 26.8% Epithelialization: Small (1-33%) Tunneling: No Undermining: No Wound Description Classification: Category/Stage II Wound Margin: Flat and Intact Exudate Amount: Medium Exudate Type: Serous Exudate Color: amber Foul Odor After Cleansing: No Slough/Fibrino Yes Wound Bed Granulation Amount: Medium (34-66%) Exposed Structure Granulation Quality: Red, Pink Fascia Exposed: No Necrotic Amount: Medium (34-66%) Fat Layer (Subcutaneous Tissue) Exposed: Yes Necrotic Quality: Adherent Slough Tendon Exposed: No Muscle Exposed: No Joint Exposed: No Bone Exposed: No Abarca, [name] ([id number]) Periwound Skin Texture Texture Color No Abnormalities Noted: No No Abnormalities Noted: No Callus: No Atrophie Blanche: No Crepitus: No Cyanosis: No Excoriation: Yes Ecchymosis: No Induration: No Erythema: Yes Rash: No Erythema Location: Circumferential Scarring: No Hemosiderin Staining: No Mottled: No Moisture Pallor: No No  Abnormalities Noted: No Rubor: No Dry / Scaly: No Maceration: No Temperature / Pain Temperature: No Abnormality Wound Preparation Ulcer Cleansing: Rinsed/Irrigated  with Saline Topical Anesthetic Applied: Other: lidocaine 4%, Treatment Notes Wound #1 (Right, Proximal Back) 1. Cleansed with: Clean wound with Normal Saline 2. Anesthetic Topical Lidocaine 4% cream to wound bed prior to debridement 3. Peri-wound Care: Skin Prep 4. Dressing Applied: Aquacel Ag 5. Secondary Dressing Applied Bordered Foam Dressing Notes Felt surrounding wound Electronic Signature(s) Signed: 04/10/2016 4:41:51 PM By: Gretta Cool, RN, BSN, Kim RN, BSN Entered By: Gretta Cool, RN, BSN, Kim on 04/10/2016 15:41:46 Ohlson, [name] ([id number]) -------------------------------------------------------------------------------- Vitals Details Patient Name: [name] Coast C. Date of Service: 04/10/2016 3:30 PM Medical Record Number: [id number] Patient Account Number: [id number] Date of Birth/Sex: 31-Dec-1950 (66 y.o. Male) Treating RN: Cornell Barman Primary Care Barclay Lennox: Otilio Miu Other Clinician: Referring Nawaf Strange: Otilio Miu Treating Idelia Caudell/Extender: Ricard Dillon Weeks in Treatment: 11 Vital Signs Time Taken: 15:36 Pulse (bpm): 79 Height (in): 70 Respiratory Rate (breaths/min): 16 Weight (lbs): 187 Blood Pressure (mmHg): 117/59 Body Mass Index (BMI): 26.8 Reference Range: 80 - 120 mg / dl Electronic Signature(s) Signed: 04/10/2016 4:41:51 PM By: Gretta Cool, RN, BSN, Kim RN, BSN Entered By: Gretta Cool, RN, BSN, Kim on 04/10/2016 15:39:00

## 2016-04-17 ENCOUNTER — Encounter: Payer: Medicare Other | Admitting: Internal Medicine

## 2016-04-17 DIAGNOSIS — Z8673 Personal history of transient ischemic attack (TIA), and cerebral infarction without residual deficits: Secondary | ICD-10-CM | POA: Diagnosis not present

## 2016-04-17 DIAGNOSIS — Z7984 Long term (current) use of oral hypoglycemic drugs: Secondary | ICD-10-CM | POA: Diagnosis not present

## 2016-04-17 DIAGNOSIS — K219 Gastro-esophageal reflux disease without esophagitis: Secondary | ICD-10-CM | POA: Diagnosis not present

## 2016-04-17 DIAGNOSIS — R569 Unspecified convulsions: Secondary | ICD-10-CM | POA: Diagnosis not present

## 2016-04-17 DIAGNOSIS — L89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, stage 3: Secondary | ICD-10-CM | POA: Diagnosis not present

## 2016-04-17 DIAGNOSIS — L89112 Pressure ulcer of right upper back, stage 2: Secondary | ICD-10-CM | POA: Diagnosis not present

## 2016-04-17 DIAGNOSIS — E785 Hyperlipidemia, unspecified: Secondary | ICD-10-CM | POA: Diagnosis not present

## 2016-04-17 DIAGNOSIS — S14103S Unspecified injury at C3 level of cervical spinal cord, sequela: Secondary | ICD-10-CM | POA: Diagnosis not present

## 2016-04-17 DIAGNOSIS — G8250 Quadriplegia, unspecified: Secondary | ICD-10-CM | POA: Diagnosis not present

## 2016-04-17 DIAGNOSIS — E119 Type 2 diabetes mellitus without complications: Secondary | ICD-10-CM | POA: Diagnosis not present

## 2016-04-17 NOTE — Progress Notes (Signed)
VIVIANO, BIR ([id number]) Visit Report for 04/10/2016 Chief Complaint Document Details Patient Name: [name], [name]. Date of Service: 04/10/2016 3:30 PM Medical Record Number: [id number] Patient Account Number: [id number] Date of Birth/Sex: 07/29/1950 (66 y.o. Male) Treating RN: Clover Mealy, RN, BSN, [place] Sink Primary Care Provider: Elizabeth Sauer Other Clinician: Referring Provider: Elizabeth Sauer Treating Provider/Extender: Maxwell Caul Weeks in Treatment: 11 Information Obtained from: Patient Chief Complaint 01/24/16 patient arrives today for review of a pressure ulcer on his right scapula in the setting of incomplete C3-C4 quadriplegia Electronic Signature(s) Signed: 04/11/2016 7:47:19 AM By: Baltazar Najjar MD Entered By: Baltazar Najjar on 04/11/2016 07:41:13 [name], [name] ([id number]) -------------------------------------------------------------------------------- Debridement Details Patient Name: [name] Bras C. Date of Service: 04/10/2016 3:30 PM Medical Record Number: [id number] Patient Account Number: [id number] Date of Birth/Sex: 1950-05-12 (66 y.o. Male) Treating RN: Huel Coventry Primary Care Provider: Elizabeth Sauer Other Clinician: Referring Provider: Elizabeth Sauer Treating Provider/Extender: Altamese [place] in Treatment: 11 Debridement Performed for Wound #1 Right,Proximal Back Assessment: Performed By: Physician Maxwell Caul, MD Debridement: Debridement Pre-procedure Yes - 15:55 Verification/Time Out Taken: Start Time: 15:56 Pain Control: Other : lidocaine 4% Level: Skin/Subcutaneous Tissue Total Area Debrided (L x 3 (cm) x 2.5 (cm) = 7.5 (cm) W): Tissue and other Fibrin/Slough, Subcutaneous material debrided: Instrument: Curette Bleeding: Moderate Hemostasis Achieved: Silver Nitrate End Time: 15:58 Procedural Pain: 0 Post Procedural Pain: 0 Response to Treatment: Procedure was tolerated well Post Debridement Measurements of Total  Wound Length: (cm) 3 Stage: Category/Stage II Width: (cm) 2.5 Depth: (cm) 0.3 Volume: (cm) 1.767 Character of Wound/Ulcer Post Requires Further Debridement: Debridement Severity of Tissue Post Fat layer exposed Debridement: Post Procedure Diagnosis Same as Pre-procedure Electronic Signature(s) Signed: 04/16/2016 12:36:27 PM By: Elliot Gurney, RN, BSN, Kim RN, BSN Signed: 04/17/2016 7:53:49 AM By: Baltazar Najjar MD Previous Signature: 04/11/2016 7:47:19 AM Version By: Baltazar Najjar MD Previous Signature: 04/10/2016 4:41:51 PM Version By: Elliot Gurney RN, BSN, Kim RN, BSN [address] [place] ([id number]) Entered By: Elliot Gurney, RN, BSN, Kim on 04/16/2016 12:26:37 [name], [name] ([id number]) -------------------------------------------------------------------------------- HPI Details Patient Name: LARENZO, CAPLES C. Date of Service: 04/10/2016 3:30 PM Medical Record Number: [id number] Patient Account Number: [id number] Date of Birth/Sex: [birth year] (66 y.o. 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 He has lower extremity sensation and can move his legs but has no/limited control over his arms. His wife accompanies him today and states that in the late spring or early summer of 2017 the patient became very depressed. He refused the refused to mobilize and he developed several pressure ulcers on his back. Most of these have healed however they have a recalcitrant area over the right scapula. They've been using Santyl on this for at least the last month. In terms of depression the patient is doing better now on an antidepressant. He saw his primary physician on 01/12/16  at which time there was apparently green drainage coming out of this area [Dr. Deana Jones]. Dr. Yetta Barre works in the [facility]. He has completed this Septra. Otherwise looking through [facility] notes that he has a history of seizures. He also had a stroke in 2008 he follows with neurology. He also has  type 2 diabetes on Glucophage, hyperlipidemia and gastroesophageal reflux. He takes Plavix for stroke prevention and Keppra for seizure prophylaxis. 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 02/08/16; this is a patient with a pressure ulcer over the right scapula. His wife has been doing the dressing with Santyl and border foam change every second day. When he arrived here 2 weeks ago we did a fairly extensive mechanical debridement. We are asked medical modalities to go out to the home and see what they might be eligible for in terms of pressure-relief surfaces [level 2] but the wife states that they have not heard from them. 03/20/15; this is a patient we haven't seen in 5-6 weeks. This was largely due to transportation issues. They've been using Santyl and border foam changing every second day for a pressure injury over the right scapula. The patient has a C3-C4 spinal injury. We had also asked for a review by the people who supplied DME to look at his wheelchair cushion, mattress etc. I don't know that this ever happened. In the meantime the wound has done remarkably well current measurements 1.8 x 2 x 0.1 04/03/16; the patient's dimensions have gone up to 3 cm in diameter quite a deterioration from last time. He is also complaining of pain in this area which is new. 04/10/16; 3 x 1.5 x 0.1. No difference from last week. Culture I did last week was negative he has completed antibiotics. Electronic Signature(s) Signed: 04/11/2016 7:47:19 AM By: Baltazar Najjar MD Entered By: Baltazar Najjar on  04/11/2016 07:42:29 [name], [name] ([id number]) -------------------------------------------------------------------------------- Physical Exam Details Patient Name: SALIL, RAINERI C. Date of Service: 04/10/2016 3:30 PM Medical Record Number: [id number] Patient Account Number: [id number] Date of Birth/Sex: [birth year] (66 y.o. Male) Treating RN: Clover Mealy, RN, BSN, [place] Sink Primary Care Provider: Elizabeth Sauer Other Clinician: Referring Provider: Elizabeth Sauer Treating Provider/Extender: Maxwell Caul Weeks in Treatment: 11 Constitutional Sitting or standing Blood Pressure is within target range for patient.. Pulse regular and within target range for patient.Marland Kitchen Respirations regular, non-labored and within target range.. Temperature is normal and within the target range for the patient.. Patient's appearance is neat and clean. Appears in no acute distress. Well nourished and well developed.. Notes 04/10/16; the patient's wound is about the same size as last week which was a general deterioration from previous. This was the reason for his empiric antibiotics although culture was negative. He remains with a fairly large circular wound over the right scapula which is a pressure area. He had nonviable tissue over 85% of the wound area removed with a #3 curet hemostasis with silver nitrate. There was no exposed bone. No surrounding erythema. Electronic Signature(s) Signed: 04/11/2016 7:47:19 AM By: Baltazar Najjar MD Entered By: Baltazar Najjar on 04/11/2016 07:44:24 [name], [name] ([id number]) -------------------------------------------------------------------------------- Physician Orders Details Patient Name: CHUKWUEBUKA, CHURCHILL C. Date of Service: 04/10/2016 3:30 PM Medical Record Number: [id number] Patient Account Number: [id number] Date of Birth/Sex: [birth year] (66 y.o. Male) Treating RN: Huel Coventry Primary Care Provider: Elizabeth Sauer Other Clinician: Referring Provider: Elizabeth Sauer Treating Provider/Extender: Altamese [place] in Treatment: 4 Verbal / Phone Orders: No Diagnosis Coding Wound Cleansing Wound #1 Right,Proximal Back o Clean wound with Normal Saline. o Cleanse wound with mild soap and water Anesthetic Wound #1 Right,Proximal Back o Topical Lidocaine 4% cream applied to wound bed prior to debridement - CLINIC USE Skin Barriers/Peri-Wound Care Wound #1 Right,Proximal Back o Skin  Prep Primary Wound Dressing Wound #1 Right,Proximal Back o Aquacel Ag Secondary Dressing Wound #1 Right,Proximal Back o Dry Gauze o Boardered Foam Dressing Dressing Change Frequency Wound #1 Right,Proximal Back o Change dressing every other day. Follow-up Appointments Wound #1 Right,Proximal Back o Return Appointment in 2 weeks. Off-Loading Wound #1 Right,Proximal Back o [facility] does not cover because they out of network. Patient wife to call the insurance company to find out with DME company is in network. o Turn and reposition every 2 hours [name], [name] ([id number]) o Other: - Pad for wheelchair for positioning FELT USE TODAY IN THE CLINIC Additional Orders / Instructions Wound #1 Right,Proximal Back o Increase protein intake. Medications-please add to medication list. Wound #1 Right,Proximal Back o Other: - VITAMIN C, ZINC, VIt A, MVI Electronic Signature(s) Signed: 04/10/2016 4:41:51 PM By: Elliot Gurney RN, BSN, Kim RN, BSN Signed: 04/11/2016 7:47:19 AM By: Baltazar Najjar MD Entered By: Elliot Gurney, RN, BSN, Kim on 04/10/2016 16:00:46 [name], [name] ([id number]) -------------------------------------------------------------------------------- Problem List Details Patient Name: JACADEN, FORBUSH. Date of Service: 04/10/2016 3:30 PM Medical Record Number: [id number] Patient Account Number: [id number] Date of Birth/Sex: [birth year] (66 y.o. Male) Treating RN: Clover Mealy, RN, BSN, [place] Sink Primary Care Provider:  Elizabeth Sauer Other Clinician: Referring Provider: Elizabeth Sauer Treating Provider/Extender: Maxwell Caul Weeks in Treatment: 11 Active Problems ICD-10 Encounter Code Description Active Date Diagnosis 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, stage 3 01/24/2016 Yes S14.103S Unspecified injury at C3 level of cervical spinal cord, 01/24/2016 Yes sequela Inactive Problems Resolved Problems Electronic Signature(s) Signed: 04/11/2016 7:47:19 AM By: Baltazar Najjar MD Entered By: Baltazar Najjar on 04/11/2016 07:40:41 [name], [name] ([id number]) -------------------------------------------------------------------------------- Progress Note Details Patient Name: [name] Bras C. Date of Service: 04/10/2016 3:30 PM Medical Record Number: [id number] Patient Account Number: [id number] Date of Birth/Sex: 1950/06/22 (66 y.o. 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 He has lower extremity sensation and can move his legs but has no/limited control over his arms. His wife accompanies him today and states that in the late spring or early summer of 2017 the patient became very depressed. He refused the refused to mobilize and he developed several pressure ulcers on his back. Most of these have healed however they have a recalcitrant area over the right scapula. They've been using Santyl on this for at least the last month. In terms of depression the patient is doing better now  on an antidepressant. He saw his primary physician on 01/12/16 at which time there was apparently green drainage coming out of this area [Dr. Deana Jones]. Dr. Yetta Barre works in the [facility]. He has completed this Septra. Otherwise looking through [facility] notes that he has a history of seizures. He also had a stroke in 2008 he follows with neurology. He also has type 2 diabetes on Glucophage, hyperlipidemia and gastroesophageal reflux. He takes Plavix for stroke prevention and Keppra for seizure prophylaxis. A recent CT scan of the head shows a chronic left middle cerebral artery infarct in the left  parietal lobe. 02/08/16; this is a patient with a pressure ulcer over the right scapula. His wife has been doing the dressing with Santyl and border foam change every second day. When he arrived here 2 weeks ago we did a fairly extensive mechanical debridement. We are asked medical modalities to go out to the home and see what they might be eligible for in terms of pressure-relief surfaces [level 2] but the wife states that they have not heard from them. 03/20/15; this is a patient we haven't seen in 5-6 weeks. This was largely due to transportation issues. They've been using Santyl and border foam changing every second day for a pressure injury over the right scapula. The patient has a C3-C4 spinal injury. We had also asked for a review by the people who supplied DME to look at his wheelchair cushion, mattress etc. I don't know that this ever happened. In the meantime the wound has done remarkably well current measurements 1.8 x 2 x 0.1 04/03/16; the patient's dimensions have gone up to 3 cm in diameter quite a deterioration from last time. He is also complaining of pain in this area which is new. 04/10/16; 3 x 1.5 x 0.1. No difference from last week. Culture I did last week was negative he has completed antibiotics. [name], [name]  ([id number]) Objective Constitutional Sitting or standing Blood Pressure is within target range for patient.. Pulse regular and within target range for patient.Marland Kitchen. Respirations regular, non-labored and within target range.. Temperature is normal and within the target range for the patient.. Patient's appearance is neat and clean. Appears in no acute distress. Well nourished and well developed.. Vitals Time Taken: 3:36 PM, Height: 70 in, Weight: 187 lbs, BMI: 26.8, Pulse: 79 bpm, Respiratory Rate: 16 breaths/min, Blood Pressure: 117/59 mmHg. General Notes: 04/10/16; the patient's wound is about the same size as last week which was a general deterioration from previous. This was the reason for his empiric antibiotics although culture was negative. He remains with a fairly large circular wound over the right scapula which is a pressure area. He had nonviable tissue over 85% of the wound area removed with a #3 curet hemostasis with silver nitrate. There was no exposed bone. No surrounding erythema. Integumentary (Hair, Skin) Wound #1 status is Open. Original cause of wound was Pressure Injury. The wound is located on the Right,Proximal Back. The wound measures 3cm length x 2.5cm width x 0.1cm depth; 5.89cm^2 area and 0.589cm^3 volume. There is Fat Layer (Subcutaneous Tissue) Exposed exposed. There is no tunneling or undermining noted. There is a medium amount of serous drainage noted. The wound margin is flat and intact. There is medium (34-66%) red, pink granulation within the wound bed. There is a medium (34-66%) amount of necrotic tissue within the wound bed including Adherent Slough. The periwound skin appearance exhibited: Excoriation, Erythema. The periwound skin appearance did not exhibit: Callus, Crepitus, Induration, Rash, Scarring, Dry/Scaly, Maceration, Atrophie Blanche, Cyanosis, Ecchymosis, Hemosiderin Staining, Mottled, Pallor, Rubor. The surrounding wound skin color is noted with  erythema which is circumferential. Periwound temperature was noted as No Abnormality. 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, stage 3 S14.103S - Unspecified injury at C3 level of cervical spinal cord, sequela [name], [name] ([id number]) Procedures Wound #1 Wound #1 is a Pressure Ulcer located on the Right,Proximal Back . There was a Skin/Subcutaneous Tissue Debridement ([id number]) debridement with total area of 7.5 sq cm performed by Maxwell CaulOBSON, Anyely Cunning G, MD. with the following instrument(s): Curette  including Fibrin/Slough and Subcutaneous after achieving pain control using Other (lidocaine 4%). A time out was conducted at 15:55, prior to the start of the procedure. A Moderate amount of bleeding was controlled with Silver Nitrate. The procedure was tolerated well with a pain level of 0 throughout and a pain level of 0 following the procedure. Post Debridement Measurements: 3cm length x 2.5cm width x 0.3cm depth; 1.767cm^3 volume. Post debridement Stage noted as Category/Stage II. Character of Wound/Ulcer Post Debridement requires further debridement. Severity of Tissue Post Debridement is: Fat layer exposed. Post procedure Diagnosis Wound #1: Same as Pre-Procedure Plan Wound Cleansing: Wound #1 Right,Proximal Back: Clean wound with Normal Saline. Cleanse wound with mild soap and water Anesthetic: Wound #1 Right,Proximal Back: Topical Lidocaine 4% cream applied to wound bed prior to debridement - CLINIC USE Skin Barriers/Peri-Wound Care: Wound #1 Right,Proximal Back: Skin Prep Primary Wound Dressing: Wound #1 Right,Proximal Back: Aquacel Ag Secondary Dressing: Wound #1 Right,Proximal Back: Dry Gauze Boardered Foam Dressing Dressing Change Frequency: Wound #1 Right,Proximal Back: Change dressing every other day. Follow-up Appointments: Wound #1 Right,Proximal Back: Return Appointment in 2 weeks. Off-Loading: Wound #1 Right,Proximal  Back: Matt[employer] does not cover because they out of network. Patient wife to call the insurance company to find out with DME company is in network. [name], ARSHIA RONDON ([id number]) Turn and reposition every 2 hours Other: - Pad for wheelchair for positioning FELT USE TODAY IN THE CLINIC Additional Orders / Instructions: Wound #1 Right,Proximal Back: Increase protein intake. Medications-please add to medication list.: Wound #1 Right,Proximal Back: Other: - VITAMIN C, ZINC, VIt A, MVI no change to Aquacel AG for now,border foam Electronic Signature(s) Signed: 04/11/2016 7:47:19 AM By: Baltazar Najjar MD Entered By: Baltazar Najjar on 04/11/2016 07:45:18 [name], [name] ([id number]) -------------------------------------------------------------------------------- SuperBill Details Patient Name: [name] Bras C. Date of Service: 04/10/2016 Medical Record Number: [id number] Patient Account Number: [id number] Date of Birth/Sex: 04-04-1950 (66 y.o. Male) Treating RN: Clover Mealy, RN, BSN, [place] Sink Primary Care Provider: Elizabeth Sauer Other Clinician: Referring Provider: Elizabeth Sauer Treating Provider/Extender: Maxwell Caul Service Line: Outpatient Weeks in Treatment: 11 Diagnosis Coding ICD-10 Codes Code Description [id number] Pressure ulcer of other site, stage 3 S14.103S Unspecified injury at C3 level of cervical spinal cord, sequela Facility Procedures CPT4 Code: [id number] Description: 11042 - DEB SUBQ TISSUE 20 SQ CM/< ICD-10 Description Diagnosis 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, stage 3 Modifier: Quantity: 1 Physician Procedures CPT4 Code: [id number] Description: 11042 - WC PHYS SUBQ TISS 20 SQ CM ICD-10 Description Diagnosis 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, stage 3 Modifier: Quantity: 1 Electronic Signature(s) Signed: 04/11/2016 7:47:19 AM By: Baltazar Najjar MD Entered By: Baltazar Najjar on 04/11/2016 07:45:44

## 2016-04-18 NOTE — Progress Notes (Signed)
JAVI, BOLLMAN ([id number]) Visit Report for 04/17/2016 Arrival Information Details Patient Name: [name], [name]. Date of Service: 04/17/2016 1:45 PM Medical Record Number: [id number] Patient Account Number: [id number] Date of Birth/Sex: 1950/04/14 (66 y.o. Male) Treating RN: Baruch Gouty, RN, BSN, Velva Harman Primary Care Demetric Parslow: Otilio Miu Other Clinician: Referring Elita Dame: Otilio Miu Treating Cason Luffman/Extender: Tito Dine in Treatment: 12 Visit Information History Since Last Visit All ordered tests and consults were completed: No Patient Arrived: Wheel Chair Added or deleted any medications: No Arrival Time: 13:46 Any new allergies or adverse reactions: No Accompanied By: SON Had a fall or experienced change in No activities of daily living that may affect Transfer Assistance: None risk of falls: Patient Identification Verified: Yes Signs or symptoms of abuse/neglect since last No Secondary Verification Process Yes visito Completed: Hospitalized since last visit: No Patient Requires Transmission-Based No Has Dressing in Place as Prescribed: Yes Precautions: Pain Present Now: No Patient Has Alerts: Yes Patient Alerts: DM II Plavix Electronic Signature(s) Signed: 04/17/2016 4:45:11 PM By: Regan Lemming BSN, RN Entered By: Regan Lemming on 04/17/2016 13:47:13 Trow, [name] -------------------------------------------------------------------------------- Encounter Discharge Information Details Patient Name: [name] Coast C. Date of Service: 04/17/2016 1:45 PM Medical Record Number: [id number] Patient Account Number: [id number] Date of Birth/Sex: Dec 19, 1950 (66 y.o. Male) Treating RN: Baruch Gouty, RN, BSN, Velva Harman Primary Care Yamen Castrogiovanni: Otilio Miu Other Clinician: Referring Rosell Khouri: Otilio Miu Treating Taron Mondor/Extender: Tito Dine in Treatment: 12 Encounter Discharge Information Items Discharge Pain Level: 0 Discharge Condition:  Stable Ambulatory Status: Wheelchair Discharge Destination: Home Transportation: Private Auto Accompanied By: son Schedule Follow-up Appointment: No Medication Reconciliation completed and provided to Patient/Care No Alexi Dorminey: Provided on Clinical Summary of Care: 04/17/2016 Form Type Recipient Paper Patient [facility] Electronic Signature(s) Signed: 04/17/2016 2:24:07 PM By: Ruthine Dose Entered By: Ruthine Dose on 04/17/2016 14:24:07 Direnzo, [name] ([id number]) -------------------------------------------------------------------------------- Lower Extremity Assessment Details Patient Name: [name], [name] Date of Service: 04/17/2016 1:45 PM Medical Record Number: [id number] Patient Account Number: [id number] Date of Birth/Sex: 1951/02/23 (66 y.o. Male) Treating RN: Baruch Gouty, RN, BSN, Velva Harman Primary Care Sherika Kubicki: Otilio Miu Other Clinician: Referring Zeriyah Wain: Otilio Miu Treating Philo Kurtz/Extender: Ricard Dillon Weeks in Treatment: 12 Electronic Signature(s) Signed: 04/17/2016 4:45:11 PM By: Regan Lemming BSN, RN Entered By: Regan Lemming on 04/17/2016 13:54:51 Baldwin, [name] ([id number]) -------------------------------------------------------------------------------- Multi Wound Chart Details Patient Name: [name], [name] Date of Service: 04/17/2016 1:45 PM Medical Record Number: [id number] Patient Account Number: [id number] Date of Birth/Sex: 03-Sep-1950 (66 y.o. Male) Treating RN: Baruch Gouty, RN, BSN, Velva Harman Primary Care Kaylany Tesoriero: Otilio Miu Other Clinician: Referring Sabre Leonetti: Otilio Miu Treating Naitik Hermann/Extender: Ricard Dillon Weeks in Treatment: 12 Vital Signs Height(in): 70 Pulse(bpm): 72 Weight(lbs): 187 Blood Pressure 84/50 (mmHg): Body Mass Index(BMI): 27 Temperature(F): 97.6 Respiratory Rate 18 (breaths/min): Photos: [1:No Photos] [N/A:N/A] Wound Location: [1:Right Back - Proximal] [N/A:N/A] Wounding Event: [1:Pressure Injury] [N/A:N/A] Primary  Etiology: [1:Pressure Ulcer] [N/A:N/A] Comorbid History: [1:Coronary Artery Disease, Type II Diabetes, Quadriplegia, Seizure Disorder] [N/A:N/A] Date Acquired: [1:11/24/2015] [N/A:N/A] Weeks of Treatment: [1:12] [N/A:N/A] Wound Status: [1:Open] [N/A:N/A] Measurements L x W x D 3.3x2.7x0.2 [N/A:N/A] (cm) Area (cm) : [1:6.998] [N/A:N/A] Volume (cm) : [1:1.4] [N/A:N/A] % Reduction in Area: [1:13.10%] [N/A:N/A] % Reduction in Volume: -73.90% [N/A:N/A] Classification: [1:Category/Stage II] [N/A:N/A] Exudate Amount: [1:Medium] [N/A:N/A] Exudate Type: [1:Serous] [N/A:N/A] Exudate Color: [1:amber] [N/A:N/A] Wound Margin: [1:Flat and Intact] [N/A:N/A] Granulation Amount: [1:Medium (34-66%)] [N/A:N/A] Granulation Quality: [1:Red, Pink] [N/A:N/A] Necrotic Amount: [1:Medium (34-66%)] [N/A:N/A] Exposed Structures: [1:Fat Layer (Subcutaneous Tissue) Exposed: Yes Fascia:  No Tendon: No Muscle: No] [N/A:N/A] Joint: No Bone: No Epithelialization: Small (1-33%) N/A N/A Debridement: Debridement (48270- N/A N/A 11047) Pre-procedure 14:05 N/A N/A Verification/Time Out Taken: Pain Control: Lidocaine 4% Topical N/A N/A Solution Tissue Debrided: Fibrin/Slough, N/A N/A Subcutaneous Level: Skin/Subcutaneous N/A N/A Tissue Debridement Area (sq 8.91 N/A N/A cm): Instrument: Curette N/A N/A Bleeding: Minimum N/A N/A Hemostasis Achieved: Pressure N/A N/A Procedural Pain: 0 N/A N/A Post Procedural Pain: 0 N/A N/A Debridement Treatment Procedure was tolerated N/A N/A Response: well Post Debridement 3.3x2.7x0.2 N/A N/A Measurements L x W x D (cm) Post Debridement 1.4 N/A N/A Volume: (cm) Post Debridement Category/Stage II N/A N/A Stage: Periwound Skin Texture: Excoriation: Yes N/A N/A Induration: No Callus: No Crepitus: No Rash: No Scarring: No Periwound Skin Maceration: No N/A N/A Moisture: Dry/Scaly: No Periwound Skin Color: Erythema: Yes N/A N/A Atrophie Blanche: No Cyanosis:  No Ecchymosis: No Hemosiderin Staining: No Mottled: No Pallor: No Rubor: No Erythema Location: Circumferential N/A N/A Temperature: No Abnormality N/A N/A Tenderness on Yes N/A N/A Palpation: Maalouf, Montrel C. ([id number]) Wound Preparation: Ulcer Cleansing: N/A N/A Rinsed/Irrigated with Saline Topical Anesthetic Applied: Other: lidocaine 4% Procedures Performed: Debridement N/A N/A Treatment Notes Wound #1 (Right, Proximal Back) 1. Cleansed with: Clean wound with Normal Saline 3. Peri-wound Care: Skin Prep 4. Dressing Applied: Prisma Ag 5. Secondary Dressing Applied Bordered Foam Dressing Dry Gauze 6. Footwear/Offloading device applied Felt/Foam 7. Secured with Tape Notes Felt surrounding wound Electronic Signature(s) Signed: 04/18/2016 9:58:44 AM By: Baltazar Najjar MD Entered By: Baltazar Najjar on 04/17/2016 14:35:58 Oppenheimer, [name] ([id number]) -------------------------------------------------------------------------------- Multi-Disciplinary Care Plan Details Patient Name: [name], VISCONTI. Date of Service: 04/17/2016 1:45 PM Medical Record Number: [id number] Patient Account Number: [id number] Date of Birth/Sex: April 01, 1950 (66 y.o. Male) Treating RN: Clover Mealy, RN, BSN, [place] Primary Care Annick Dimaio: Elizabeth Sauer Other Clinician: Referring Rhiannon Sassaman: Elizabeth Sauer Treating Cheyrl Buley/Extender: Maxwell Caul Weeks in Treatment: 12 Active Inactive ` Nutrition Nursing Diagnoses: Imbalanced nutrition Impaired glucose control: actual or potential Goals: Patient/caregiver agrees to and verbalizes understanding of need to use nutritional supplements and/or vitamins as prescribed Date Initiated: 01/24/2016 Target Resolution Date: 03/27/2016 Goal Status: Active Patient/caregiver will maintain therapeutic glucose control Date Initiated: 01/24/2016 Target Resolution Date: 03/27/2016 Goal Status: Active Interventions: Assess patient nutrition upon admission and as  needed per policy Provide education on elevated blood sugars and impact on wound healing Notes: ` Orientation to the Wound Care Program Nursing Diagnoses: Knowledge deficit related to the wound healing center program Goals: Patient/caregiver will verbalize understanding of the Wound Healing Center Program Date Initiated: 01/24/2016 Target Resolution Date: 03/27/2016 Goal Status: Active Interventions: Provide education on orientation to the wound center Notes: SAHIB, PELLA ([id number]) ` Pain, Acute or Chronic Nursing Diagnoses: Pain, acute or chronic: actual or potential Potential alteration in comfort, pain Goals: Patient will verbalize adequate pain control and receive pain control interventions during procedures as needed Date Initiated: 01/24/2016 Target Resolution Date: 03/27/2016 Goal Status: Active Patient/caregiver will verbalize adequate pain control between visits Date Initiated: 01/24/2016 Target Resolution Date: 03/27/2016 Goal Status: Active Patient/caregiver will verbalize comfort level met Date Initiated: 01/24/2016 Target Resolution Date: 03/27/2016 Goal Status: Active Interventions: Assess comfort goal upon admission Complete pain assessment as per visit requirements Notes: ` Pressure Nursing Diagnoses: Knowledge deficit related to management of pressures ulcers Potential for impaired tissue integrity related to pressure, friction, moisture, and shear Goals: Patient will remain free from development of additional pressure ulcers Date Initiated: 01/24/2016 Target Resolution Date: 03/27/2016 Goal Status: Active  Interventions: Assess: immobility, friction, shearing, incontinence upon admission and as needed Assess offloading mechanisms upon admission and as needed Assess potential for pressure ulcer upon admission and as needed Provide education on pressure ulcers Notes: [name], [name] ([id number]) Wound/Skin Impairment Nursing  Diagnoses: Impaired tissue integrity Goals: Ulcer/skin breakdown will have a volume reduction of 30% by week 4 Date Initiated: 01/24/2016 Target Resolution Date: 03/27/2016 Goal Status: Active Ulcer/skin breakdown will have a volume reduction of 50% by week 8 Date Initiated: 01/24/2016 Target Resolution Date: 03/27/2016 Goal Status: Active Ulcer/skin breakdown will have a volume reduction of 80% by week 12 Date Initiated: 01/24/2016 Target Resolution Date: 03/27/2016 Goal Status: Active Interventions: Assess patient/caregiver ability to perform ulcer/skin care regimen upon admission and as needed Assess ulceration(s) every visit Notes: Electronic Signature(s) Signed: 04/17/2016 4:45:11 PM By: Regan Lemming BSN, RN Entered By: Regan Lemming on 04/17/2016 13:54:56 [name], [name] ([id number]) -------------------------------------------------------------------------------- Pain Assessment Details Patient Name: [name] Coast C. Date of Service: 04/17/2016 1:45 PM Medical Record Number: [id number] Patient Account Number: [id number] Date of Birth/Sex: [birth year] (67 y.o. Male) Treating RN: Baruch Gouty, RN, BSN, Velva Harman Primary Care Anaiza Behrens: Otilio Miu Other Clinician: Referring Iver Miklas: Otilio Miu Treating Lailee Hoelzel/Extender: Ricard Dillon Weeks in Treatment: 12 Active Problems Location of Pain Severity and Description of Pain Patient Has Paino No Site Locations With Dressing Change: No Pain Management and Medication Current Pain Management: Electronic Signature(s) Signed: 04/17/2016 4:45:11 PM By: Regan Lemming BSN, RN Entered By: Regan Lemming on 04/17/2016 13:47:20 [name], [name] ([id number]) -------------------------------------------------------------------------------- Patient/Caregiver Education Details Patient Name: [name]. Date of Service: 04/17/2016 1:45 PM Medical Record Number: [id number] Patient Account Number: [id number] Date of Birth/Gender: 08-22-1950 (66  y.o. Male) Treating RN: Baruch Gouty, RN, BSN, Velva Harman Primary Care Physician: Otilio Miu Other Clinician: Referring Physician: Otilio Miu Treating Physician/Extender: Tito Dine in Treatment: 12 Education Assessment Education Provided To: Patient Education Topics Provided Elevated Blood Sugar/ Impact on Healing: Methods: Explain/Verbal Responses: State content correctly Pressure: Methods: Explain/Verbal Responses: State content correctly Welcome To [facility]: Methods: Explain/Verbal Responses: State content correctly Wound Debridement: Methods: Explain/Verbal Responses: State content correctly Wound/Skin Impairment: Methods: Explain/Verbal Responses: State content correctly Electronic Signature(s) Signed: 04/17/2016 4:45:11 PM By: Regan Lemming BSN, RN Entered By: Regan Lemming on 04/17/2016 14:11:35 [name], [name] ([id number]) -------------------------------------------------------------------------------- Wound Assessment Details Patient Name: [name], [name] Date of Service: 04/17/2016 1:45 PM Medical Record Number: [id number] Patient Account Number: [id number] Date of Birth/Sex: 12/30/1950 (66 y.o. Male) Treating RN: Baruch Gouty, RN, BSN, [place] Primary Care Addie Alonge: Otilio Miu Other Clinician: Referring Simora Dingee: Otilio Miu Treating Merrilee Ancona/Extender: Ricard Dillon Weeks in Treatment: 12 Wound Status Wound Number: 1 Primary Pressure Ulcer Etiology: Wound Location: Right Back - Proximal Wound Open Wounding Event: Pressure Injury Status: Date Acquired: 11/24/2015 Comorbid Coronary Artery Disease, Type II Weeks Of Treatment: 12 History: Diabetes, Quadriplegia, Seizure Clustered Wound: No Disorder Photos Photo Uploaded By: Regan Lemming on 04/17/2016 17:00:45 Wound Measurements Length: (cm) 3.3 Width: (cm) 2.7 Depth: (cm) 0.2 Area: (cm) 6.998 Volume: (cm) 1.4 % Reduction in Area: 13.1% % Reduction in Volume:  -73.9% Epithelialization: Small (1-33%) Tunneling: No Undermining: No Wound Description Classification: Category/Stage II Wound Margin: Flat and Intact Exudate Amount: Medium Exudate Type: Serous Exudate Color: amber Foul Odor After Cleansing: No Slough/Fibrino Yes Wound Bed Granulation Amount: Medium (34-66%) Exposed Structure Granulation Quality: Red, Pink Fascia Exposed: No Necrotic Amount: Medium (34-66%) Fat Layer (Subcutaneous Tissue) Exposed: Yes Necrotic Quality: Adherent Slough Tendon Exposed: No [name], [name] (  [id number]) Muscle Exposed: No Joint Exposed: No Bone Exposed: No Periwound Skin Texture Texture Color No Abnormalities Noted: No No Abnormalities Noted: No Callus: No Atrophie Blanche: No Crepitus: No Cyanosis: No Excoriation: Yes Ecchymosis: No Induration: No Erythema: Yes Rash: No Erythema Location: Circumferential Scarring: No Hemosiderin Staining: No Mottled: No Moisture Pallor: No No Abnormalities Noted: No Rubor: No Dry / Scaly: No Maceration: No Temperature / Pain Temperature: No Abnormality Tenderness on Palpation: Yes Wound Preparation Ulcer Cleansing: Rinsed/Irrigated with Saline Topical Anesthetic Applied: Other: lidocaine 4%, Treatment Notes Wound #1 (Right, Proximal Back) 1. Cleansed with: Clean wound with Normal Saline 3. Peri-wound Care: Skin Prep 4. Dressing Applied: Prisma Ag 5. Secondary Dressing Applied Bordered Foam Dressing Dry Gauze 6. Footwear/Offloading device applied Felt/Foam 7. Secured with Tape Notes Felt surrounding wound Electronic Signature(s) Signed: 04/17/2016 4:45:11 PM By: Regan Lemming BSN, RN Entered By: Regan Lemming on 04/17/2016 13:53:57 [name], [name] ([id number]) [address], [name] NAWABI ([id number]) -------------------------------------------------------------------------------- Vitals Details Patient Name: [name] Coast C. Date of Service: 04/17/2016 1:45 PM Medical Record Number:  [id number] Patient Account Number: [id number] Date of Birth/Sex: [birth year] (66 y.o. Male) Treating RN: Baruch Gouty, RN, BSN, [place] Primary Care Linda Grimmer: Otilio Miu Other Clinician: Referring Bensyn Bornemann: Otilio Miu Treating Westin Knotts/Extender: Ricard Dillon Weeks in Treatment: 12 Vital Signs Time Taken: 13:53 Temperature (F): 97.6 Height (in): 70 Pulse (bpm): 72 Weight (lbs): 187 Respiratory Rate (breaths/min): 18 Body Mass Index (BMI): 26.8 Blood Pressure (mmHg): 84/50 Reference Range: 80 - 120 mg / dl Electronic Signature(s) Signed: 04/17/2016 4:45:11 PM By: Regan Lemming BSN, RN Entered By: Regan Lemming on 04/17/2016 13:53:31

## 2016-04-18 NOTE — Progress Notes (Signed)
ADAIAH, MORKEN ([id number]) Visit Report for 04/17/2016 Chief Complaint Document Details Patient Name: [name], [name]. Date of Service: 04/17/2016 1:45 PM Medical Record Number: [id number] Patient Account Number: [id number] Date of Birth/Sex: 1950-09-04 (66 y.o. Male) Treating RN: Clover Mealy, RN, BSN, [place] Sink Primary Care Provider: Elizabeth Sauer Other Clinician: Referring Provider: Elizabeth Sauer Treating Provider/Extender: Maxwell Caul Weeks in Treatment: 12 Information Obtained from: Patient Chief Complaint 01/24/16 patient arrives today for review of a pressure ulcer on his right scapula in the setting of incomplete C3-C4 quadriplegia Electronic Signature(s) Signed: 04/18/2016 9:58:44 AM By: Baltazar Najjar MD Entered By: Baltazar Najjar on 04/17/2016 14:36:22 Fannin, [name] ([id number]) -------------------------------------------------------------------------------- Debridement Details Patient Name: [name] Bras C. Date of Service: 04/17/2016 1:45 PM Medical Record Number: [id number] Patient Account Number: [id number] Date of Birth/Sex: [birth year] (66 y.o. Male) Treating RN: Clover Mealy, RN, BSN, Rita Primary Care Provider: Elizabeth Sauer Other Clinician: Referring Provider: Elizabeth Sauer Treating Provider/Extender: Altamese [place] in Treatment: 12 Debridement Performed for Wound #1 Right,Proximal Back Assessment: Performed By: Physician Maxwell Caul, MD Debridement: Debridement Pre-procedure Yes - 14:05 Verification/Time Out Taken: Start Time: 14:05 Pain Control: Lidocaine 4% Topical Solution Level: Skin/Subcutaneous Tissue Total Area Debrided (L x 3.3 (cm) x 2.7 (cm) = 8.91 (cm) W): Tissue and other Fibrin/Slough, Subcutaneous material debrided: Instrument: Curette Bleeding: Minimum Hemostasis Achieved: Pressure End Time: 14:10 Procedural Pain: 0 Post Procedural Pain: 0 Response to Treatment: Procedure was tolerated well Post Debridement Measurements of  Total Wound Length: (cm) 3.3 Stage: Category/Stage II Width: (cm) 2.7 Depth: (cm) 0.2 Volume: (cm) 1.4 Character of Wound/Ulcer Post Requires Further Debridement: Debridement Severity of Tissue Post Fat layer exposed Debridement: Post Procedure Diagnosis Same as Pre-procedure Electronic Signature(s) Signed: 04/17/2016 4:45:11 PM By: Elpidio Eric BSN, RN Signed: 04/18/2016 9:58:44 AM By: Baltazar Najjar MD Entered By: Baltazar Najjar on 04/17/2016 14:36:11 [name], [name] ([id number]) [name], [name] ([id number]) -------------------------------------------------------------------------------- HPI Details Patient Name: [name], [name] Date of Service: 04/17/2016 1:45 PM Medical Record Number: [id number] Patient Account Number: [id number] Date of Birth/Sex: 04/14/1950 (66 y.o. 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 He has lower extremity sensation and can move his legs but has no/limited control over his arms. His wife accompanies him today and states that in the late spring or early summer of 2017 the patient became very depressed. He refused the refused to mobilize and he developed several pressure ulcers on his back. Most of these have healed however they have a recalcitrant area over the right scapula. They've been using Santyl on this for at least the last month. In terms of depression the patient is doing better now on an antidepressant. He saw his primary physician on 01/12/16 at which time there was apparently green drainage coming out of this area [Dr. Deana Jones]. Dr. Yetta Barre works in the [facility]. He  has completed this Septra. Otherwise [facility] notes that he has a history of seizures. He also had a stroke in 2008 he follows with neurology. He also has type 2 diabetes on Glucophage, hyperlipidemia and gastroesophageal reflux. He takes Plavix for stroke prevention and Keppra for seizure prophylaxis. 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 02/08/16; this is a patient with a pressure ulcer over the right scapula. His wife has been doing the dressing with Santyl and border foam change every second day. When he arrived here 2 weeks ago we did a fairly extensive mechanical debridement. We are asked medical modalities to go out to the home and see what they might be eligible for in terms of pressure-relief surfaces [level 2] but the wife states that they have not heard from them. 03/20/15; this is a patient we haven't seen in 5-6 weeks. This was largely due to transportation issues. They've been using Santyl and border foam changing every second day for a pressure injury over the right scapula. The patient has a C3-C4 spinal injury. We had also asked for a review by the people who supplied DME to look at his wheelchair cushion, mattress etc. I don't know that this ever happened. In the meantime the wound has done remarkably well current measurements 1.8 x 2 x 0.1 04/03/16; the patient's dimensions have gone up to 3 cm in diameter quite a deterioration from last time. He is also complaining of pain in this area which is new. 04/10/16; 3 x 1.5 x 0.1. No difference from last week. Culture I did last week was negative he has completed antibiotics. Electronic Signature(s) Signed: 04/18/2016 9:58:44 AM By: Baltazar Najjar MD Entered By: Baltazar Najjar on 04/17/2016 14:37:12 Driver, [name] ([id number]) -------------------------------------------------------------------------------- Physical Exam Details Patient  Name: [name], [name] Date of Service: 04/17/2016 1:45 PM Medical Record Number: [id number] Patient Account Number: [id number] Date of Birth/Sex: 08/05/1950 (66 y.o. Male) Treating RN: Clover Mealy, RN, BSN, [place] Sink Primary Care Provider: Elizabeth Sauer Other Clinician: Referring Provider: Elizabeth Sauer Treating Provider/Extender: Maxwell Caul Weeks in Treatment: 12 Constitutional Patient is hypotensive. However he appears well. Pulse regular and within target range for patient.Marland Kitchen Respirations regular, non-labored and within target range.. Temperature is normal and within the target range for the patient.. Patient's appearance is neat and clean. Appears in no acute distress. Well nourished and well developed.. Notes Wound exam; the patient's wound is larger than last week and appears to be getting deeper in the mid aspect of the wound which probably is against the medial aspect of the scapula. Using a #3 Curtet I debrided nonviable necrotic tissue over the center of this wound. Electronic Signature(s) Signed: 04/18/2016 9:58:44 AM By: Baltazar Najjar MD Entered By: Baltazar Najjar on 04/17/2016 14:38:51 Aldrete, [name] ([id number]) -------------------------------------------------------------------------------- Physician Orders Details Patient Name: [name], [name] Date of Service: 04/17/2016 1:45 PM Medical Record Number: [id number] Patient Account Number: [id number] Date of Birth/Sex: April 10, 1950 (66 y.o. Male) Treating RN: Clover Mealy, RN, BSN, [place] Sink Primary Care Provider: Elizabeth Sauer Other Clinician: Referring Provider: Elizabeth Sauer Treating Provider/Extender: Altamese [place] in Treatment: 12 Verbal / Phone Orders: No Diagnosis Coding Wound Cleansing Wound #1 Right,Proximal Back o Clean wound with Normal Saline. o Cleanse wound with mild soap and water Anesthetic Wound #1 Right,Proximal Back o Topical Lidocaine 4% cream applied to wound bed prior to debridement - CLINIC  USE Skin Barriers/Peri-Wound Care Wound #1 Right,Proximal Back o Skin Prep Primary Wound Dressing Wound #1 Right,Proximal Back o Prisma Ag Secondary Dressing Wound #1 Right,Proximal Back o Dry Gauze o Boardered Foam Dressing Dressing Change Frequency Wound #1 Right,Proximal Back o Change dressing every other day. Follow-up Appointments Wound #1 Right,Proximal Back o Return Appointment in 1 week. Off-Loading Wound #1 Right,Proximal Back o  [employer] does not cover because they out of network. Patient wife to call the insurance company to find out with DME company is in network. o Turn and reposition every 2 hours Zeidman, Juron C. ([id number]) o Other: - Pad for wheelchair for positioning FELT USE TODAY IN THE CLINIC Additional Orders / Instructions Wound #1 Right,Proximal Back o Increase protein intake. Medications-please add to medication list. Wound #1 Right,Proximal Back o Other: - VITAMIN C, ZINC, VIt A, MVI Electronic Signature(s) Signed: 04/17/2016 4:45:11 PM By: Elpidio EricAfful, Rita BSN, RN Signed: 04/18/2016 9:58:44 AM By: Baltazar Najjarobson, Marcelia Petersen MD Entered By: Elpidio EricAfful, Rita on 04/17/2016 14:09:18 [name], [name] BienICHARD C. ([id number]) -------------------------------------------------------------------------------- Problem List Details Patient Name: [name], [name] Date of Service: 04/17/2016 1:45 PM Medical Record Number: [id number] Patient Account Number: [id number] Date of Birth/Sex: 02-07-1951 39(65 y.o. Male) Treating RN: Clover MealyAfful, RN, BSN, [place] Sinkita Primary Care Provider: Elizabeth SauerJones, Deanna Other Clinician: Referring Provider: Elizabeth SauerJones, Deanna Treating Provider/Extender: Maxwell CaulOBSON, Furious Chiarelli G Weeks in Treatment: 12 Active Problems ICD-10 Encounter Code Description Active Date Diagnosis 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, stage 3 01/24/2016 Yes S14.103S Unspecified injury at C3 level of cervical spinal cord, 01/24/2016 Yes sequela Inactive Problems Resolved  Problems Electronic Signature(s) Signed: 04/18/2016 9:58:44 AM By: Baltazar Najjarobson, Jahniyah Revere MD Entered By: Baltazar Najjarobson, Lopaka Karge on 04/17/2016 14:35:44 Thurston, [name] BienICHARD C. ([id number]) -------------------------------------------------------------------------------- Progress Note Details Patient Name: [name], [name] Date of Service: 04/17/2016 1:45 PM Medical Record Number: [id number] Patient Account Number: [id number] Date of Birth/Sex: 02-07-1951 [age] y.o. 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 He has lower extremity sensation and can move his legs but has no/limited control over his arms. His wife accompanies him today and states that in the late spring or early summer of 2017 the patient became very depressed. He refused the refused to mobilize and he developed several pressure ulcers on his back. Most of these have healed however they have a recalcitrant area over the right scapula. They've been using Santyl on this for at least the last month. In terms of depression the patient is doing better now on an antidepressant. He saw his primary physician on 01/12/16 at which time there was apparently green drainage coming out of this area [Dr. Deana Jones]. Dr. Yetta BarreJones works in the [facility]. He has completed this Septra. Otherwise [facility] notes that he has a history of seizures. He also had a stroke in 2008 he follows with neurology. He  also has type 2 diabetes on Glucophage, hyperlipidemia and gastroesophageal reflux. He takes Plavix for stroke prevention and Keppra for seizure prophylaxis. 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 02/08/16; this is a patient with a pressure ulcer over the right scapula. His wife has been doing the dressing with Santyl and border foam change every second day. When he arrived here 2 weeks ago we did a fairly extensive mechanical debridement. We are asked medical modalities to go out to the home and see what  they might be eligible for in terms of pressure-relief surfaces [level 2] but the wife states that they have not heard from them. 03/20/15; this is a patient we haven't seen in 5-6 weeks. This was largely due to transportation issues. They've been using Santyl and border foam changing every second day for a pressure injury over the right scapula. The patient has a C3-C4 spinal injury. We had also asked for a review by the people who supplied DME to look at his wheelchair cushion, mattress etc. I don't know that this ever happened. In the meantime the wound has done remarkably well current measurements 1.8 x 2 x 0.1 04/03/16; the patient's dimensions have gone up to 3 cm in diameter quite a deterioration from last time. He is also complaining of pain in this area which is new. 04/10/16; 3 x 1.5 x 0.1. No difference from last week. Culture I did last week was negative he has completed antibiotics. [name], [name] ([id number]) Objective Constitutional Patient is hypotensive. However he appears well. Pulse regular and within target range for patient.Marland Kitchen Respirations regular, non-labored and within target range.. Temperature is normal and within the target range for the patient.. Patient's appearance is neat and clean. Appears in no acute distress. Well nourished and well developed.. Vitals Time Taken: 1:53 PM, Height: 70 in, Weight:  187 lbs, BMI: 26.8, Temperature: 97.6 F, Pulse: 72 bpm, Respiratory Rate: 18 breaths/min, Blood Pressure: 84/50 mmHg. General Notes: Wound exam; the patient's wound is larger than last week and appears to be getting deeper in the mid aspect of the wound which probably is against the medial aspect of the scapula. Using a #3 Curtet I debrided nonviable necrotic tissue over the center of this wound. Integumentary (Hair, Skin) Wound #1 status is Open. Original cause of wound was Pressure Injury. The wound is located on the Right,Proximal Back. The wound measures 3.3cm length x 2.7cm width x 0.2cm depth; 6.998cm^2 area and 1.4cm^3 volume. There is Fat Layer (Subcutaneous Tissue) Exposed exposed. There is no tunneling or undermining noted. There is a medium amount of serous drainage noted. The wound margin is flat and intact. There is medium (34-66%) red, pink granulation within the wound bed. There is a medium (34-66%) amount of necrotic tissue within the wound bed including Adherent Slough. The periwound skin appearance exhibited: Excoriation, Erythema. The periwound skin appearance did not exhibit: Callus, Crepitus, Induration, Rash, Scarring, Dry/Scaly, Maceration, Atrophie Blanche, Cyanosis, Ecchymosis, Hemosiderin Staining, Mottled, Pallor, Rubor. The surrounding wound skin color is noted with erythema which is circumferential. Periwound temperature was noted as No Abnormality. The periwound has tenderness on palpation. 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, stage 3 S14.103S - Unspecified injury at C3 level of cervical spinal cord, sequela [name], Trea C. ([id number]) Procedures Wound #1 Wound #1 is a Pressure Ulcer located on the Right,Proximal Back . There was a Skin/Subcutaneous Tissue Debridement ([id number]) debridement with total area of 8.91 sq cm performed by Maxwell Caul, MD. with the following instrument(s): Curette including  Fibrin/Slough and Subcutaneous after achieving pain control using Lidocaine 4% Topical Solution. A time out was conducted at 14:05, prior to the start of the procedure. A Minimum amount of bleeding was controlled with Pressure. The procedure was tolerated well with a pain level of 0 throughout and a pain level of 0 following the procedure. Post Debridement Measurements: 3.3cm length x 2.7cm width x 0.2cm depth; 1.4cm^3 volume. Post debridement Stage noted as Category/Stage II. Character of Wound/Ulcer Post  Debridement requires further debridement. Severity of Tissue Post Debridement is: Fat layer exposed. Post procedure Diagnosis Wound #1: Same as Pre-Procedure Plan Wound Cleansing: Wound #1 Right,Proximal Back: Clean wound with Normal Saline. Cleanse wound with mild soap and water Anesthetic: Wound #1 Right,Proximal Back: Topical Lidocaine 4% cream applied to wound bed prior to debridement - CLINIC USE Skin Barriers/Peri-Wound Care: Wound #1 Right,Proximal Back: Skin Prep Primary Wound Dressing: Wound #1 Right,Proximal Back: Prisma Ag Secondary Dressing: Wound #1 Right,Proximal Back: Dry Gauze Boardered Foam Dressing Dressing Change Frequency: Wound #1 Right,Proximal Back: Change dressing every other day. Follow-up Appointments: Wound #1 Right,Proximal Back: Return Appointment in 1 week. Off-Loading: Wound #1 Right,Proximal Back: [employer] does not cover because they out of network. Patient wife to call the insurance company to find out with DME company is in network. Kobler, CHANTZ MONTEFUSCO ([id number]) Turn and reposition every 2 hours Other: - Pad for wheelchair for positioning FELT USE TODAY IN THE CLINIC Additional Orders / Instructions: Wound #1 Right,Proximal Back: Increase protein intake. Medications-please add to medication list.: Wound #1 Right,Proximal Back: Other: - VITAMIN C, ZINC, VIt A, MVI extensive discussion about offloading. He has  indents around this wound, I suspect pressure relief is inadequate change from silver alginate to silver collagen son will attempt cutout foam his wife was supposed to call his insurance ([employer]) to see who the DME providor isoo He is in this chair 12 hours per day Electronic Signature(s) Signed: 04/18/2016 9:58:44 AM By: Baltazar Najjar MD Entered By: Baltazar Najjar on 04/17/2016 14:41:31 Enderle, [name] ([id number]) -------------------------------------------------------------------------------- SuperBill Details Patient Name: [name] Bras C. Date of Service: 04/17/2016 Medical Record Number: [id number] Patient Account Number: [id number] Date of Birth/Sex: [birth year] (66 y.o. Male) Treating RN: Clover Mealy, RN, BSN, [place] Sink Primary Care Provider: Elizabeth Sauer Other Clinician: Referring Provider: Elizabeth Sauer Treating Provider/Extender: Maxwell Caul Service Line: Outpatient Weeks in Treatment: 12 Diagnosis Coding ICD-10 Codes Code Description [phone] Pressure ulcer of other site, stage 3 S14.103S Unspecified injury at C3 level of cervical spinal cord, sequela Facility Procedures CPT4 Code: [id number] Description: 11042 - DEB SUBQ TISSUE 20 SQ CM/< ICD-10 Description Diagnosis 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, stage 3 Modifier: Quantity: 1 Physician Procedures CPT4 Code: [id number] Description: 11042 - WC PHYS SUBQ TISS 20 SQ CM ICD-10 Description Diagnosis 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, stage 3 Modifier: Quantity: 1 Electronic Signature(s) Signed: 04/18/2016 9:58:44 AM By: Baltazar Najjar MD Entered By: Baltazar Najjar on 04/17/2016 14:41:54

## 2016-04-20 DIAGNOSIS — L89130 Pressure ulcer of right lower back, unstageable: Secondary | ICD-10-CM | POA: Diagnosis not present

## 2016-04-24 ENCOUNTER — Encounter: Payer: Medicare Other | Admitting: Internal Medicine

## 2016-04-24 DIAGNOSIS — K219 Gastro-esophageal reflux disease without esophagitis: Secondary | ICD-10-CM | POA: Diagnosis not present

## 2016-04-24 DIAGNOSIS — G8250 Quadriplegia, unspecified: Secondary | ICD-10-CM | POA: Diagnosis not present

## 2016-04-24 DIAGNOSIS — R569 Unspecified convulsions: Secondary | ICD-10-CM | POA: Diagnosis not present

## 2016-04-24 DIAGNOSIS — S14103S Unspecified injury at C3 level of cervical spinal cord, sequela: Secondary | ICD-10-CM | POA: Diagnosis not present

## 2016-04-24 DIAGNOSIS — E119 Type 2 diabetes mellitus without complications: Secondary | ICD-10-CM | POA: Diagnosis not present

## 2016-04-24 DIAGNOSIS — Z7984 Long term (current) use of oral hypoglycemic drugs: Secondary | ICD-10-CM | POA: Diagnosis not present

## 2016-04-24 DIAGNOSIS — L89113 Pressure ulcer of right upper back, stage 3: Secondary | ICD-10-CM | POA: Diagnosis not present

## 2016-04-24 DIAGNOSIS — Z8673 Personal history of transient ischemic attack (TIA), and cerebral infarction without residual deficits: Secondary | ICD-10-CM | POA: Diagnosis not present

## 2016-04-24 DIAGNOSIS — E785 Hyperlipidemia, unspecified: Secondary | ICD-10-CM | POA: Diagnosis not present

## 2016-04-24 DIAGNOSIS — L89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, stage 3: Secondary | ICD-10-CM | POA: Diagnosis not present

## 2016-04-25 NOTE — Progress Notes (Addendum)
ADDISON, WHIDBEE ([id number]) Visit Report for 04/24/2016 Arrival Information Details Patient Name: [name], [name]. Date of Service: 04/24/2016 2:45 PM Medical Record Number: [id number] Patient Account Number: [id number] Date of Birth/Sex: 1950-11-02 (66 y.o. Male) Treating RN: Baruch Gouty, RN, BSN, Velva Harman Primary Care Everett Ehrler: Otilio Miu Other Clinician: Referring Chery Giusto: Otilio Miu Treating Argentina Kosch/Extender: Tito Dine in Treatment: 13 Visit Information History Since Last Visit All ordered tests and consults were completed: No Patient Arrived: Wheel Chair Added or deleted any medications: No Arrival Time: 14:51 Any new allergies or adverse reactions: No Accompanied By: son Had a fall or experienced change in No activities of daily living that may affect Transfer Assistance: None risk of falls: Patient Identification Verified: Yes Signs or symptoms of abuse/neglect since last No Secondary Verification Process Yes visito Completed: Hospitalized since last visit: No Patient Requires Transmission-Based No Has Dressing in Place as Prescribed: Yes Precautions: Pain Present Now: Yes Patient Has Alerts: Yes Patient Alerts: DM II Plavix Electronic Signature(s) Signed: 04/24/2016 3:48:34 PM By: Regan Lemming BSN, RN Entered By: Regan Lemming on 04/24/2016 14:51:45 Najarro, Leslye Peer ([id number]) -------------------------------------------------------------------------------- Clinic Level of Care Assessment Details Patient Name: [name], [name] Date of Service: 04/24/2016 2:45 PM Medical Record Number: [id number] Patient Account Number: [id number] Date of Birth/Sex: 1950-05-20 (66 y.o. Male) Treating RN: Baruch Gouty, RN, BSN, [place] Primary Care [name]: Otilio Miu Other Clinician: Referring Corgan Mormile: Otilio Miu Treating Lelania Bia/Extender: Tito Dine in Treatment: 13 Clinic Level of Care Assessment Items TOOL 4 Quantity Score _0  - Use when only an EandM  is performed on FOLLOW-UP visit 0 ASSESSMENTS - Nursing Assessment / Reassessment X - Reassessment of Co-morbidities (includes updates in patient status) 1 10 X - Reassessment of Adherence to Treatment Plan 1 5 ASSESSMENTS - Wound and Skin Assessment / Reassessment X - Simple Wound Assessment / Reassessment - one wound 1 5 _1  - Complex Wound Assessment / Reassessment - multiple wounds 0 _2  - Dermatologic / Skin Assessment (not related to wound area) 0 ASSESSMENTS - Focused Assessment _3  - Circumferential Edema Measurements - multi extremities 0 _4  - Nutritional Assessment / Counseling / Intervention 0 _5  - Lower Extremity Assessment (monofilament, tuning fork, pulses) 0 _6  - Peripheral Arterial Disease Assessment (using hand held doppler) 0 ASSESSMENTS - Ostomy and/or Continence Assessment and Care _7  - Incontinence Assessment and Management 0 _8  - Ostomy Care Assessment and Management (repouching, etc.) 0 PROCESS - Coordination of Care X - Simple Patient / Family Education for ongoing care 1 15 _9  - Complex (extensive) Patient / Family Education for ongoing care 0 _10  - Staff obtains Programmer, systems, Records, Test Results / Process Orders 0 _11  - Staff telephones HHA, Nursing Homes / Clarify orders / etc 0 _12  - Routine Transfer to another Facility (non-emergent condition) 0 Vanderberg, Joan C. ([id number]) _13  - Routine Hospital Admission (non-emergent condition) 0 _14  - New Admissions / Biomedical engineer / Ordering NPWT, Apligraf, etc. 0 _15  - Emergency Hospital Admission (emergent condition) 0 _16  - Simple Discharge Coordination 0 _17  - Complex (extensive) Discharge Coordination 0 PROCESS - Special Needs _18  - Pediatric / Minor Patient Management 0 _19  - Isolation Patient Management 0 _20  - Hearing / Language / Visual special needs 0 _21  - Assessment of Community assistance (transportation, D/C planning, etc.) 0 _22  - Additional assistance / Altered mentation 0 _23  - Support Surface(s)  Assessment (bed, cushion, seat, etc.) 0 INTERVENTIONS - Wound Cleansing / Measurement X - Simple Wound Cleansing - one wound 1 5 _24  - Complex  Wound Cleansing - multiple wounds 0 X - Wound Imaging (photographs - any number of wounds) 1 5 _0  - Wound Tracing (instead of photographs) 0 X - Simple Wound Measurement - one wound 1 5 _1  - Complex Wound Measurement - multiple wounds 0 INTERVENTIONS - Wound Dressings X - Small Wound Dressing one or multiple wounds 1 10 _2  - Medium Wound Dressing one or multiple wounds 0 _3  - Large Wound Dressing one or multiple wounds 0 <DZHGDJMEQASTMHDQ>_2<\/IWLNLGXQJJHERDEY>_8  - Application of Medications - topical 0 <XKGYJEHUDJSHFWYO>_3<\/ZCHYIFOYDXAJOINO>_6  - Application of Medications - injection 0 INTERVENTIONS - Miscellaneous _6  - External ear exam 0 Rotenberry, Zayvian C. ([id number]) _7  - Specimen Collection (cultures, biopsies, blood, body fluids, etc.) 0 _8  - Specimen(s) / Culture(s) sent or taken to Lab for analysis 0 _9  - Patient Transfer (multiple staff / Harrel Lemon Lift / Similar devices) 0 _10  - Simple Staple / Suture removal (25 or less) 0 _11  - Complex Staple / Suture removal (26 or more) 0 _12  - Hypo / Hyperglycemic Management (close monitor of Blood Glucose) 0 _13  - Ankle / Brachial Index (ABI) - do not check if billed separately 0 X - Vital Signs 1 5 Has the patient been seen at the hospital within the last three years: Yes Total Score: 65 Level Of Care: New/Established - Level 2 Electronic Signature(s) Signed: 04/24/2016 3:48:34 PM By: Regan Lemming BSN, RN Entered By: Regan Lemming on 04/24/2016 15:10:16 Beason, Leslye Peer ([id number]) -------------------------------------------------------------------------------- Encounter Discharge Information Details Patient Name: [name] Date of Service: 04/24/2016 2:45 PM Medical Record Number: [id number] Patient Account Number: [id number] Date of Birth/Sex: 1950-06-18 (66 y.o. Male) Treating RN: Baruch Gouty, RN, BSN, Velva Harman Primary Care Vincenzina Jagoda: Otilio Miu Other Clinician: Referring  Jaelynn Pozo: Otilio Miu Treating Jeny Nield/Extender: Tito Dine in Treatment: 13 Encounter Discharge Information Items Discharge Pain Level: 0 Discharge Condition: Stable Ambulatory Status: Wheelchair Discharge Destination: Home Transportation: Private Auto Accompanied By: son Schedule Follow-up Appointment: No Medication Reconciliation completed and provided to Patient/Care No Cornellius Kropp: Provided on Clinical Summary of Care: 04/24/2016 Form Type Recipient Paper Patient [facility] Electronic Signature(s) Signed: 04/24/2016 3:48:34 PM By: Regan Lemming BSN, RN Previous Signature: 04/24/2016 3:10:10 PM Version By: Ruthine Dose Entered By: Regan Lemming on 04/24/2016 15:11:54 Wool, Leslye Peer ([id number]) -------------------------------------------------------------------------------- Lower Extremity Assessment Details Patient Name: Vanbergen, Onnie C. Date of Service: 04/24/2016 2:45 PM Medical Record Number: [id number] Patient Account Number: [id number] Date of Birth/Sex: June 05, 1950 (66 y.o. Male) Treating RN: Baruch Gouty, RN, BSN, Velva Harman Primary Care Faith Patricelli: Otilio Miu Other Clinician: Referring Criselda Starke: Otilio Miu Treating Karcyn Menn/Extender: Ricard Dillon Weeks in Treatment: 13 Electronic Signature(s) Signed: 04/24/2016 3:48:34 PM By: Regan Lemming BSN, RN Entered By: Regan Lemming on 04/24/2016 14:53:24 Matsen, Leslye Peer ([id number]) -------------------------------------------------------------------------------- Multi Wound Chart Details Patient Name: Manso, Shiquan C. Date of Service: 04/24/2016 2:45 PM Medical Record Number: [id number] Patient Account Number: [id number] Date of Birth/Sex: January 19, 1951 (66 y.o. Male) Treating RN: Baruch Gouty, RN, BSN, Velva Harman Primary Care Sukhman Kocher: Otilio Miu Other Clinician: Referring Kathline Banbury: Otilio Miu Treating Ericca Labra/Extender: Ricard Dillon Weeks in Treatment: 13 Vital Signs Height(in): 70 Pulse(bpm): 71 Weight(lbs): 187 Blood  Pressure 92/43 (mmHg): Body Mass Index(BMI): 27 Temperature(F): 97.8 Respiratory Rate 17 (breaths/min): Photos: [N/A:N/A] Wound Location: Right Back - Proximal N/A N/A Wounding Event: Pressure Injury N/A N/A Primary Etiology: Pressure Ulcer N/A N/A Comorbid History: Coronary Artery Disease, N/A N/A Type II Diabetes, Quadriplegia, Seizure Disorder Date Acquired: 11/24/2015 N/A N/A Weeks of Treatment: 13 N/A N/A Wound Status: Open N/A N/A Measurements L x W x  D 2.4x2x0.2 N/A N/A (cm) Area (cm) : 3.77 N/A N/A Volume (cm) : 0.754 N/A N/A % Reduction in Area: 53.20% N/A N/A % Reduction in Volume: 6.30% N/A N/A Classification: Category/Stage II N/A N/A Exudate Amount: Medium N/A N/A Exudate Type: Serous N/A N/A Exudate Color: amber N/A N/A Wound Margin: Flat and Intact N/A N/A Granulation Amount: Medium (34-66%) N/A N/A Granulation Quality: Red, Pink N/A N/A Holzschuh, Stephane C. ([id number]) Necrotic Amount: Medium (34-66%) N/A N/A Exposed Structures: Fat Layer (Subcutaneous N/A N/A Tissue) Exposed: Yes Fascia: No Tendon: No Muscle: No Joint: No Bone: No Epithelialization: Small (1-33%) N/A N/A Periwound Skin Texture: Excoriation: Yes N/A N/A Induration: No Callus: No Crepitus: No Rash: No Scarring: No Periwound Skin Maceration: No N/A N/A Moisture: Dry/Scaly: No Periwound Skin Color: Erythema: Yes N/A N/A Atrophie Blanche: No Cyanosis: No Ecchymosis: No Hemosiderin Staining: No Mottled: No Pallor: No Rubor: No Erythema Location: Circumferential N/A N/A Temperature: No Abnormality N/A N/A Tenderness on Yes N/A N/A Palpation: Wound Preparation: Ulcer Cleansing: N/A N/A Rinsed/Irrigated with Saline Topical Anesthetic Applied: Other: lidocaine 4% Treatment Notes Wound #1 (Right, Proximal Back) 1. Cleansed with: Clean wound with Normal Saline 4. Dressing Applied: Prisma Ag 5. Secondary Dressing Applied Bordered Foam Dressing Dry Gauze Electronic  Signature(s) MARIBEL, LUIS ([id number]) Signed: 04/25/2016 3:56:26 PM By: Linton Ham MD Previous Signature: 04/24/2016 3:48:34 PM Version By: Regan Lemming BSN, RN Entered By: Linton Ham on 04/25/2016 07:36:05 Lips, Leslye Peer ([id number]) -------------------------------------------------------------------------------- Multi-Disciplinary Care Plan Details Patient Name: ZAVIEN, CLUBB. Date of Service: 04/24/2016 2:45 PM Medical Record Number: [id number] Patient Account Number: [id number] Date of Birth/Sex: 12-05-1950 (66 y.o. Male) Treating RN: Baruch Gouty, RN, BSN, Velva Harman Primary Care Peytan Andringa: Otilio Miu Other Clinician: Referring Kaeo Jacome: Otilio Miu Treating Maggie Senseney/Extender: Tito Dine in Treatment: 13 Active Inactive ` Nutrition Nursing Diagnoses: Imbalanced nutrition Impaired glucose control: actual or potential Goals: Patient/caregiver agrees to and verbalizes understanding of need to use nutritional supplements and/or vitamins as prescribed Date Initiated: 01/24/2016 Target Resolution Date: 03/27/2016 Goal Status: Active Patient/caregiver will maintain therapeutic glucose control Date Initiated: 01/24/2016 Target Resolution Date: 03/27/2016 Goal Status: Active Interventions: Assess patient nutrition upon admission and as needed per policy Provide education on elevated blood sugars and impact on wound healing Notes: ` Orientation to the Wound Care Program Nursing Diagnoses: Knowledge deficit related to the wound healing center program Goals: Patient/caregiver will verbalize understanding of the [place] Date Initiated: 01/24/2016 Target Resolution Date: 03/27/2016 Goal Status: Active Interventions: Provide education on orientation to the wound center Notes: RENATO, SPELLMAN ([id number]) ` Pain, Acute or Chronic Nursing Diagnoses: Pain, acute or chronic: actual or potential Potential alteration in comfort,  pain Goals: Patient will verbalize adequate pain control and receive pain control interventions during procedures as needed Date Initiated: 01/24/2016 Target Resolution Date: 03/27/2016 Goal Status: Active Patient/caregiver will verbalize adequate pain control between visits Date Initiated: 01/24/2016 Target Resolution Date: 03/27/2016 Goal Status: Active Patient/caregiver will verbalize comfort level met Date Initiated: 01/24/2016 Target Resolution Date: 03/27/2016 Goal Status: Active Interventions: Assess comfort goal upon admission Complete pain assessment as per visit requirements Notes: ` Pressure Nursing Diagnoses: Knowledge deficit related to management of pressures ulcers Potential for impaired tissue integrity related to pressure, friction, moisture, and shear Goals: Patient will remain free from development of additional pressure ulcers Date Initiated: 01/24/2016 Target Resolution Date: 03/27/2016 Goal Status: Active Interventions: Assess: immobility, friction, shearing, incontinence upon admission and as needed Assess offloading mechanisms upon admission and as needed Assess  potential for pressure ulcer upon admission and as needed Provide education on pressure ulcers Notes: PLEAS, CARNEAL ([id number]) Wound/Skin Impairment Nursing Diagnoses: Impaired tissue integrity Goals: Ulcer/skin breakdown will have a volume reduction of 30% by week 4 Date Initiated: 01/24/2016 Target Resolution Date: 03/27/2016 Goal Status: Active Ulcer/skin breakdown will have a volume reduction of 50% by week 8 Date Initiated: 01/24/2016 Target Resolution Date: 03/27/2016 Goal Status: Active Ulcer/skin breakdown will have a volume reduction of 80% by week 12 Date Initiated: 01/24/2016 Target Resolution Date: 03/27/2016 Goal Status: Active Interventions: Assess patient/caregiver ability to perform ulcer/skin care regimen upon admission and as needed Assess ulceration(s) every  visit Notes: Electronic Signature(s) Signed: 04/24/2016 3:48:34 PM By: Regan Lemming BSN, RN Entered By: Regan Lemming on 04/24/2016 15:01:41 Hinsley, Leslye Peer ([id number]) -------------------------------------------------------------------------------- Pain Assessment Details Patient Name: [name] Date of Service: 04/24/2016 2:45 PM Medical Record Number: [id number] Patient Account Number: [id number] Date of Birth/Sex: Mar 08, 1950 (66 y.o. Male) Treating RN: Baruch Gouty, RN, BSN, Velva Harman Primary Care Deiona Hooper: Otilio Miu Other Clinician: Referring Cashlynn Yearwood: Otilio Miu Treating Burna Atlas/Extender: Tito Dine in Treatment: 13 Active Problems Location of Pain Severity and Description of Pain Patient Has Paino Yes Site Locations Pain Location: Pain in Ulcers With Dressing Change: Yes Rate the pain. Current Pain Level: 3 Character of Pain Describe the Pain: Tender Pain Management and Medication Current Pain Management: Electronic Signature(s) Signed: 04/24/2016 3:48:34 PM By: Regan Lemming BSN, RN Entered By: Regan Lemming on 04/24/2016 14:52:01 Lebeau, Leslye Peer ([id number]) -------------------------------------------------------------------------------- Patient/Caregiver Education Details Patient Name: [name] Date of Service: 04/24/2016 2:45 PM Medical Record Number: [id number] Patient Account Number: [id number] Date of Birth/Gender: 07-19-1950 (67 y.o. Male) Treating RN: Baruch Gouty, RN, BSN, Velva Harman Primary Care Physician: Otilio Miu Other Clinician: Referring Physician: Otilio Miu Treating Physician/Extender: Tito Dine in Treatment: 13 Education Assessment Education Provided To: Patient Education Topics Provided Elevated Blood Sugar/ Impact on Healing: Methods: Explain/Verbal Responses: State content correctly Pressure: Methods: Explain/Verbal Responses: State content correctly Welcome To [facility]: Methods:  Explain/Verbal Responses: State content correctly Wound/Skin Impairment: Methods: Explain/Verbal Responses: State content correctly Electronic Signature(s) Signed: 04/24/2016 3:48:34 PM By: Regan Lemming BSN, RN Entered By: Regan Lemming on 04/24/2016 15:12:18 Cooksey, Leslye Peer ([id number]) -------------------------------------------------------------------------------- Wound Assessment Details Patient Name: Parisien, Lincon C. Date of Service: 04/24/2016 2:45 PM Medical Record Number: [id number] Patient Account Number: [id number] Date of Birth/Sex: 1950/09/02 (66 y.o. Male) Treating RN: Baruch Gouty, RN, BSN, [place] Primary Care Lillyian Heidt: Otilio Miu Other Clinician: Referring Kyleena Scheirer: Otilio Miu Treating Collene Massimino/Extender: Ricard Dillon Weeks in Treatment: 13 Wound Status Wound Number: 1 Primary Pressure Ulcer Etiology: Wound Location: Right Back - Proximal Wound Open Wounding Event: Pressure Injury Status: Date Acquired: 11/24/2015 Comorbid Coronary Artery Disease, Type II Weeks Of Treatment: 13 History: Diabetes, Quadriplegia, Seizure Clustered Wound: No Disorder Photos Photo Uploaded By: Regan Lemming on 04/24/2016 15:45:13 Wound Measurements Length: (cm) 2.4 Width: (cm) 2 Depth: (cm) 0.2 Area: (cm) 3.77 Volume: (cm) 0.754 % Reduction in Area: 53.2% % Reduction in Volume: 6.3% Epithelialization: Small (1-33%) Tunneling: No Undermining: No Wound Description Classification: Category/Stage II Wound Margin: Flat and Intact Exudate Amount: Medium Exudate Type: Serous Exudate Color: amber Foul Odor After Cleansing: No Slough/Fibrino Yes Wound Bed Granulation Amount: Medium (34-66%) Exposed Structure Granulation Quality: Red, Pink Fascia Exposed: No Necrotic Amount: Medium (34-66%) Fat Layer (Subcutaneous Tissue) Exposed: Yes Necrotic Quality: Adherent Slough Tendon Exposed: No Valverde, Gabino C. ([id number]) Muscle Exposed: No Joint Exposed: No Bone Exposed:  No Periwound Skin Texture Texture Color No Abnormalities Noted: No No Abnormalities Noted: No Callus: No Atrophie Blanche: No Crepitus: No Cyanosis: No Excoriation: Yes Ecchymosis: No Induration: No Erythema: Yes Rash: No Erythema Location: Circumferential Scarring: No Hemosiderin Staining: No Mottled: No Moisture Pallor: No No Abnormalities Noted: No Rubor: No Dry / Scaly: No Maceration: No Temperature / Pain Temperature: No Abnormality Tenderness on Palpation: Yes Wound Preparation Ulcer Cleansing: Rinsed/Irrigated with Saline Topical Anesthetic Applied: Other: lidocaine 4%, Treatment Notes Wound #1 (Right, Proximal Back) 1. Cleansed with: Clean wound with Normal Saline 4. Dressing Applied: Prisma Ag 5. Secondary Dressing Applied Bordered Foam Dressing Dry Gauze Electronic Signature(s) Signed: 04/24/2016 3:48:34 PM By: Regan Lemming BSN, RN Entered By: Regan Lemming on 04/24/2016 14:58:33 Eroh, Leslye Peer ([id number]) -------------------------------------------------------------------------------- [place] Details Patient Name: [name] Date of Service: 04/24/2016 2:45 PM Medical Record Number: [id number] Patient Account Number: [id number] Date of Birth/Sex: March 04, 1950 (65 y.o. Male) Treating RN: Baruch Gouty, RN, BSN, [place] Primary Care Lecia Esperanza: Otilio Miu Other Clinician: Referring Lutisha Knoche: Otilio Miu Treating Linnaea Ahn/Extender: Ricard Dillon Weeks in Treatment: 13 Vital Signs Time Taken: 14:53 Temperature (F): 97.8 Height (in): 70 Pulse (bpm): 71 Weight (lbs): 187 Respiratory Rate (breaths/min): 17 Body Mass Index (BMI): 26.8 Blood Pressure (mmHg): 92/43 Reference Range: 80 - 120 mg / dl Electronic Signature(s) Signed: 04/24/2016 3:48:34 PM By: Regan Lemming BSN, RN Entered By: Regan Lemming on 04/24/2016 14:54:16

## 2016-04-26 ENCOUNTER — Other Ambulatory Visit: Payer: Self-pay | Admitting: Family Medicine

## 2016-04-26 NOTE — Progress Notes (Signed)
[name], Jaivyn C. ([id number]) Visit Report for 04/24/2016 Chief Complaint Document Details Patient Name: [name], [name] Date of Service: 04/24/2016 2:45 PM Medical Record Number: [id number] Patient Account Number: [id number] Date of Birth/Sex: [birth year] 8(65 y.o. Male) Treating RN: Clover MealyAfful, RN, BSN, [place] Sinkita Primary Care Provider: Elizabeth SauerJones, Deanna Other Clinician: Referring Provider: Elizabeth SauerJones, Deanna Treating Provider/Extender: Maxwell CaulOBSON, MICHAEL G Weeks in Treatment: 13 Information Obtained from: Patient Chief Complaint 01/24/16 patient arrives today for review of a pressure ulcer on his right scapula in the setting of incomplete C3-C4 quadriplegia Electronic Signature(s) Signed: 04/25/2016 3:56:26 PM By: Baltazar Najjarobson, Michael MD Entered By: Baltazar Najjarobson, Michael on 04/25/2016 07:36:17 Ferrell, [name] ([id number]) -------------------------------------------------------------------------------- HPI Details Patient Name: [name], [name] Date of Service: 04/24/2016 2:45 PM Medical Record Number: [id number] Patient Account Number: [id number] Date of Birth/Sex: [birth year] 75(65 y.o. 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 He has lower extremity sensation and can move his legs but has no/limited control over his arms. His wife accompanies him today and states that in the late spring or early summer of 2017 the patient became very depressed. He refused the refused to mobilize and he developed several pressure ulcers on his back. Most of these have healed however they have a recalcitrant area over the right scapula. They've been using Santyl on this for at least the last month.  In terms of depression the patient is doing better now on an antidepressant. He saw his primary physician on 01/12/16 at which time there was apparently green drainage coming out of this area [Dr. Deana Jones]. Dr. Yetta BarreJones works in the [facility]. He has completed this Septra. Otherwise looking through [facility] notes that he has a history of seizures. He also had a stroke in 2008 he follows with neurology. He also has type 2 diabetes on Glucophage, hyperlipidemia and gastroesophageal reflux. He takes Plavix for stroke prevention and Keppra for seizure prophylaxis. 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 02/08/16; this is a patient with a pressure ulcer over the right scapula. His wife has been doing the dressing with Santyl and border foam change every second day. When he arrived here 2 weeks ago we did a fairly extensive mechanical debridement. We are asked medical modalities to go out to the home and see what they might be eligible for in terms of pressure-relief surfaces [level 2] but the wife states that they have not heard from them. 03/20/15; this is a patient we haven't seen in 5-6 weeks. This was largely due to transportation issues. They've been using Santyl and border foam changing every second day for a pressure injury over the right scapula. The patient has a C3-C4 spinal injury. We had also asked for a review by the people who supplied DME to look at his wheelchair cushion, mattress etc. I don't know that this ever happened. In the meantime the wound has done remarkably well current measurements 1.8 x 2 x 0.1 04/03/16; the patient's dimensions have gone up to 3 cm in diameter quite a deterioration from last time. He is also complaining of pain in this area which is new. 04/10/16; 3  x 1.5 x 0.1. No difference from last week. Culture I did last week was negative he has  completed antibiotics. 04/24/16 2.4 x 2 x 0.1; wound generally looks smaller. Middle area that I had to debrided last time looks healthier. His son is fashioned a large piece of foam cut out where the patient's wound with hit the back of his wheelchair Electronic Signature(s) Signed: 04/25/2016 3:56:26 PM By: Baltazar Najjar MD Entered By: Baltazar Najjar on 04/25/2016 07:39:14 Justiss, [name] ([id number]) -------------------------------------------------------------------------------- Physical Exam Details Patient Name: [name], [name] Date of Service: 04/24/2016 2:45 PM Medical Record Number: [id number] Patient Account Number: [id number] Date of Birth/Sex: [birth year] (67 y.o. Male) Treating RN: Clover Mealy, RN, BSN, [place] Sink Primary Care Provider: Elizabeth Sauer Other Clinician: Referring Provider: Elizabeth Sauer Treating Provider/Extender: Maxwell Caul Weeks in Treatment: 13 Constitutional Patient is hypotensive.. Pulse regular and within target range for patient.Marland Kitchen Respirations regular, non-labored and within target range.. Temperature is normal and within the target range for the patient.. Patient's appearance is neat and clean. Appears in no acute distress. Well nourished and well developed.. Notes Wound exam; patient's wound is smaller and appears healthier this week. The area in the middle that has depth is still present but the surface looks improved. There is no evidence of surrounding infection Electronic Signature(s) Signed: 04/25/2016 3:56:26 PM By: Baltazar Najjar MD Entered By: Baltazar Najjar on 04/25/2016 07:39:57 Mennen, [name] ([id number]) -------------------------------------------------------------------------------- Physician Orders Details Patient Name: [name] Bras C. Date of Service: 04/24/2016 2:45 PM Medical Record Number: [id number] Patient Account Number: [id number] Date of Birth/Sex: 01/05/1951 (66 y.o. Male) Treating RN: Clover Mealy, RN, BSN, [place] Sink Primary Care  Provider: Elizabeth Sauer Other Clinician: Referring Provider: Elizabeth Sauer Treating Provider/Extender: Altamese [place] in Treatment: 77 Verbal / Phone Orders: No Diagnosis Coding Wound Cleansing Wound #1 Right,Proximal Back o Clean wound with Normal Saline. o Cleanse wound with mild soap and water Anesthetic Wound #1 Right,Proximal Back o Topical Lidocaine 4% cream applied to wound bed prior to debridement - CLINIC USE Skin Barriers/Peri-Wound Care Wound #1 Right,Proximal Back o Skin Prep Primary Wound Dressing Wound #1 Right,Proximal Back o Prisma Ag Secondary Dressing Wound #1 Right,Proximal Back o Dry Gauze o Boardered Foam Dressing Dressing Change Frequency Wound #1 Right,Proximal Back o Change dressing every other day. Follow-up Appointments Wound #1 Right,Proximal Back o Return Appointment in 1 week. Off-Loading Wound #1 Right,Proximal Back o Mattress [employer] does not cover because they out of network. Patient wife to call the insurance company to find out with DME company is in network. o Turn and reposition every 2 hours Owen, Schon C. ([id number]) o Other: - Pad for wheelchair for positioning FELT USE TODAY IN THE CLINIC Additional Orders / Instructions Wound #1 Right,Proximal Back o Increase protein intake. Medications-please add to medication list. Wound #1 Right,Proximal Back o Other: - VITAMIN C, ZINC, VIt A, MVI Electronic Signature(s) Signed: 04/24/2016 3:48:34 PM By: Elpidio Eric BSN, RN Signed: 04/25/2016 3:56:26 PM By: Baltazar Najjar MD Entered By: Elpidio Eric on 04/24/2016 15:09:21 Brashier, [name] ([id number]) -------------------------------------------------------------------------------- Problem List Details Patient Name: Melder, Beverly C. Date of Service: 04/24/2016 2:45 PM Medical Record Number: [id number] Patient Account Number: [id number] Date of Birth/Sex: [birth year] (66 y.o. Male) Treating  RN: Clover Mealy, RN, BSN, [place] Sink Primary Care Provider: Elizabeth Sauer Other Clinician: Referring Provider: Elizabeth Sauer Treating Provider/Extender: Maxwell Caul Weeks in Treatment: 13 Active Problems ICD-10 Encounter Code Description Active Date Diagnosis 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, stage 3 01/24/2016 Yes  S14.103S Unspecified injury at C3 level of cervical spinal cord, 01/24/2016 Yes sequela Inactive Problems Resolved Problems Electronic Signature(s) Signed: 04/25/2016 3:56:26 PM By: Baltazar Najjar MD Entered By: Baltazar Najjar on 04/25/2016 07:35:47 Wimmer, [name] ([id number]) -------------------------------------------------------------------------------- Progress Note Details Patient Name: Wollin, Yovanni C. Date of Service: 04/24/2016 2:45 PM Medical Record Number: [id number] Patient Account Number: [id number] Date of Birth/Sex: [birth year] (66 y.o. 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 He has lower extremity sensation and can move his legs but has no/limited control over his arms. His wife accompanies him today and states that in the late spring or early summer of 2017 the patient became very depressed. He refused the refused to mobilize and he developed several pressure ulcers on his back. Most of these have healed however they have a recalcitrant area over the right scapula. They've been using Santyl on this for at least the last month. In  terms of depression the patient is doing better now on an antidepressant. He saw his primary physician on 01/12/16 at which time there was apparently green drainage coming out of this area [Dr. Deana Jones]. Dr. Yetta Barre works in the [facility]. He has completed this Septra. Otherwise looking [facility] notes that he has a history of seizures. He also had a stroke in 2008 he follows with neurology. He also has type 2 diabetes on Glucophage, hyperlipidemia and gastroesophageal reflux. He takes Plavix for stroke prevention and Keppra for seizure prophylaxis. 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 02/08/16; this is a patient with a pressure ulcer over the right scapula. His wife has been doing the dressing with Santyl and border foam change every second day. When he arrived here 2 weeks ago we did a fairly extensive mechanical debridement. We are asked medical modalities to go out to the home and see what they might be eligible for in terms of pressure-relief surfaces [level 2] but the wife states that they have not heard from them. 03/20/15; this is a patient we haven't seen in 5-6 weeks. This was largely due to transportation issues. They've been using Santyl and border foam changing every second day for a pressure injury over the right scapula. The patient has a C3-C4 spinal injury. We had also asked for a review by the people who supplied DME to look at his wheelchair cushion, mattress etc. I don't know that this ever happened. In the meantime the wound has done remarkably well current measurements 1.8 x 2 x 0.1 04/03/16; the patient's dimensions have gone up to 3 cm in diameter quite a deterioration from last time. He is also complaining of pain in this area which is new. 04/10/16; 3 x 1.5 x 0.1. No difference from last week. Culture I did last week was negative he has  completed antibiotics. 04/24/16 2.4 x 2 x 0.1; wound generally looks smaller. Middle area that I had to debrided last time looks healthier. His son is fashioned  a large piece of foam cut out where the patient's wound with hit the back of his wheelchair JOHNNELL, LIOU. ([id number]) Objective Constitutional Patient is hypotensive.. Pulse regular and within target range for patient.Marland Kitchen Respirations regular, non-labored and within target range.. Temperature is normal and within the target range for the patient.. Patient's appearance is neat and clean. Appears in no acute distress. Well nourished and well developed.. Vitals Time Taken: 2:53 PM, Height: 70 in, Weight: 187 lbs, BMI: 26.8, Temperature: 97.8 F, Pulse: 71 bpm, Respiratory Rate: 17 breaths/min, Blood Pressure: 92/43 mmHg. General Notes: Wound exam; patient's wound is smaller and appears healthier this week. The area in the middle that has depth is still present but the surface looks improved. There is no evidence of surrounding infection Integumentary (Hair, Skin) Wound #1 status is Open. Original cause of wound was Pressure Injury. The wound is located on the Right,Proximal Back. The wound measures 2.4cm length x 2cm width x 0.2cm depth; 3.77cm^2 area and 0.754cm^3 volume. There is Fat Layer (Subcutaneous Tissue) Exposed exposed. There is no tunneling or undermining noted. There is a medium amount of serous drainage noted. The wound margin is flat and intact. There is medium (34-66%) red, pink granulation within the wound bed. There is a medium (34-66%) amount of necrotic tissue within the wound bed including Adherent Slough. The periwound skin appearance exhibited: Excoriation, Erythema. The periwound skin appearance did not exhibit: Callus, Crepitus, Induration, Rash, Scarring, Dry/Scaly, Maceration, Atrophie Blanche, Cyanosis, Ecchymosis, Hemosiderin Staining, Mottled, Pallor, Rubor. The surrounding wound skin color is noted with  erythema which is circumferential. Periwound temperature was noted as No Abnormality. The periwound has tenderness on palpation. 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, stage 3 S14.103S - Unspecified injury at C3 level of cervical spinal cord, sequela Sara, Marke C. ([id number]) Plan Wound Cleansing: Wound #1 Right,Proximal Back: Clean wound with Normal Saline. Cleanse wound with mild soap and water Anesthetic: Wound #1 Right,Proximal Back: Topical Lidocaine 4% cream applied to wound bed prior to debridement - CLINIC USE Skin Barriers/Peri-Wound Care: Wound #1 Right,Proximal Back: Skin Prep Primary Wound Dressing: Wound #1 Right,Proximal Back: Prisma Ag Secondary Dressing: Wound #1 Right,Proximal Back: Dry Gauze Boardered Foam Dressing Dressing Change Frequency: Wound #1 Right,Proximal Back: Change dressing every other day. Follow-up Appointments: Wound #1 Right,Proximal Back: Return Appointment in 1 week. Off-Loading: Wound #1 Right,Proximal Back: Matt[employer] does not cover because they out of network. Patient wife to call the insurance company to find out with DME company is in network. Turn and reposition every 2 hours Other: - Pad for wheelchair for positioning FELT USE TODAY IN THE CLINIC Additional Orders / Instructions: Wound #1 Right,Proximal Back: Increase protein intake. Medications-please add to medication list.: Wound #1 Right,Proximal Back: Other: - VITAMIN C, ZINC, VIt A, MVI #1 continue with Prisma and border foam dressing. I am hopeful that with the additional pressure-relief this will start to epithelialized at a faster rate. Question [facility] Electronic Signature(s) KRZYSZTOF, REICHELT ([id number]) Signed: 04/25/2016 3:56:26 PM By: Baltazar Najjar MD Entered By: Baltazar Najjar on 04/25/2016 07:40:48 Eskelson, [name]  ([id number]) -------------------------------------------------------------------------------- SuperBill Details Patient Name: BELEN, ZWAHLEN. Date of Service: 04/24/2016 Medical Record Number: [id number] Patient Account Number: [id number] Date of Birth/Sex: 06/17/1950 (66 y.o. Male) Treating RN: Clover Mealy, RN, BSN, [place] Sink Primary Care Provider: Elizabeth Sauer Other Clinician: Referring Provider: Elizabeth Sauer Treating Provider/Extender: Maxwell Caul Service Line: Outpatient Weeks in Treatment: 13 Diagnosis Coding ICD-10 Codes Code Description  Z61.096 Pressure ulcer of other site, stage 3 S14.103S Unspecified injury at C3 level of cervical spinal cord, sequela Facility Procedures CPT4 Code: [id number] Description: [phone] - WOUND CARE VISIT-LEV 2 EST PT Modifier: Quantity: 1 Physician Procedures CPT4 Code: [id number] Description: [id number] - WC PHYS LEVEL 2 - EST PT ICD-10 Description Diagnosis 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, stage 3 Modifier: Quantity: 1 Electronic Signature(s) Signed: 04/25/2016 3:56:26 PM By: Baltazar Najjar MD Entered By: Baltazar Najjar on 04/25/2016 07:41:17

## 2016-05-01 ENCOUNTER — Encounter: Payer: Medicare Other | Attending: Internal Medicine | Admitting: Internal Medicine

## 2016-05-01 DIAGNOSIS — L89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, stage 3: Secondary | ICD-10-CM | POA: Insufficient documentation

## 2016-05-01 DIAGNOSIS — R569 Unspecified convulsions: Secondary | ICD-10-CM | POA: Diagnosis not present

## 2016-05-01 DIAGNOSIS — Z8673 Personal history of transient ischemic attack (TIA), and cerebral infarction without residual deficits: Secondary | ICD-10-CM | POA: Diagnosis not present

## 2016-05-01 DIAGNOSIS — W19XXXS Unspecified fall, sequela: Secondary | ICD-10-CM | POA: Diagnosis not present

## 2016-05-01 DIAGNOSIS — F329 Major depressive disorder, single episode, unspecified: Secondary | ICD-10-CM | POA: Insufficient documentation

## 2016-05-01 DIAGNOSIS — Z7984 Long term (current) use of oral hypoglycemic drugs: Secondary | ICD-10-CM | POA: Diagnosis not present

## 2016-05-01 DIAGNOSIS — S14103S Unspecified injury at C3 level of cervical spinal cord, sequela: Secondary | ICD-10-CM | POA: Diagnosis not present

## 2016-05-01 DIAGNOSIS — E119 Type 2 diabetes mellitus without complications: Secondary | ICD-10-CM | POA: Insufficient documentation

## 2016-05-01 DIAGNOSIS — K219 Gastro-esophageal reflux disease without esophagitis: Secondary | ICD-10-CM | POA: Insufficient documentation

## 2016-05-01 DIAGNOSIS — L89102 Pressure ulcer of unspecified part of back, stage 2: Secondary | ICD-10-CM | POA: Diagnosis not present

## 2016-05-01 DIAGNOSIS — E785 Hyperlipidemia, unspecified: Secondary | ICD-10-CM | POA: Insufficient documentation

## 2016-05-01 DIAGNOSIS — G8250 Quadriplegia, unspecified: Secondary | ICD-10-CM | POA: Diagnosis not present

## 2016-05-02 NOTE — Progress Notes (Addendum)
[name], [name] ([id number]) Visit Report for 05/01/2016 Arrival Information Details Patient Name: [name], [name]. Date of Service: 05/01/2016 1:45 PM Medical Record Number: [id number] Patient Account Number: [id number] Date of Birth/Sex: [birth year] (66 y.o. Male) Treating RN: Baruch Gouty, RN, BSN, Velva Harman Primary Care Arhianna Ebey: Otilio Miu Other Clinician: Referring Tiny Rietz: Otilio Miu Treating Taiten Brawn/Extender: Tito Dine in Treatment: 14 Visit Information History Since Last Visit All ordered tests and consults were completed: No Patient Arrived: Wheel Chair Added or deleted any medications: No Arrival Time: 14:02 Any new allergies or adverse reactions: No Accompanied By: wife Had a fall or experienced change in No activities of daily living that may affect Transfer Assistance: None risk of falls: Patient Identification Verified: Yes Signs or symptoms of abuse/neglect since last No Secondary Verification Process Yes visito Completed: Hospitalized since last visit: No Patient Requires Transmission-Based No Has Dressing in Place as Prescribed: Yes Precautions: Pain Present Now: No Patient Has Alerts: Yes Patient Alerts: DM II Plavix Electronic Signature(s) Signed: 05/01/2016 5:41:40 PM By: Regan Lemming BSN, RN Entered By: Regan Lemming on 05/01/2016 14:03:02 Export, [name] ([id number]) -------------------------------------------------------------------------------- Encounter Discharge Information Details Patient Name: [name] Coast C. Date of Service: 05/01/2016 1:45 PM Medical Record Number: [id number] Patient Account Number: [id number] Date of Birth/Sex: [birth year] (66 y.o. Male) Treating RN: Baruch Gouty, RN, BSN, Velva Harman Primary Care Athen Riel: Otilio Miu Other Clinician: Referring Jachin Coury: Otilio Miu Treating Jeison Delpilar/Extender: Tito Dine in Treatment: 14 Encounter Discharge Information Items Discharge Pain Level: 0 Discharge Condition:  Stable Ambulatory Status: Wheelchair Discharge Destination: Home Transportation: Private Auto Accompanied By: wife Schedule Follow-up Appointment: No Medication Reconciliation completed No and provided to Patient/Care Imre Vecchione: Provided on Clinical Summary of Care: 05/01/2016 Form Type Recipient Paper Patient [facility] Electronic Signature(s) Signed: 05/01/2016 5:46:48 PM By: Regan Lemming BSN, RN Previous Signature: 05/01/2016 5:45:59 PM Version By: Regan Lemming BSN, RN Previous Signature: 05/01/2016 2:29:19 PM Version By: Ruthine Dose Entered By: Regan Lemming on 05/01/2016 17:46:48 [place], [name] ([id number]) -------------------------------------------------------------------------------- Lower Extremity Assessment Details Patient Name: [name], [name] Date of Service: 05/01/2016 1:45 PM Medical Record Number: [id number] Patient Account Number: [id number] Date of Birth/Sex: 1950/07/23 (66 y.o. Male) Treating RN: Baruch Gouty, RN, BSN, Velva Harman Primary Care Jaleigha Deane: Otilio Miu Other Clinician: Referring Troy Kanouse: Otilio Miu Treating Jasminemarie Sherrard/Extender: Ricard Dillon Weeks in Treatment: 14 Electronic Signature(s) Signed: 05/01/2016 5:41:40 PM By: Regan Lemming BSN, RN Entered By: Regan Lemming on 05/01/2016 [place], [name] ([id number]) -------------------------------------------------------------------------------- Multi Wound Chart Details Patient Name: [name], [name] Date of Service: 05/01/2016 1:45 PM Medical Record Number: [id number] Patient Account Number: [id number] Date of Birth/Sex: [birth year] (66 y.o. Male) Treating RN: Baruch Gouty, RN, BSN, Velva Harman Primary Care Donyae Kilner: Otilio Miu Other Clinician: Referring Praise Stennett: Otilio Miu Treating Herschel Fleagle/Extender: Ricard Dillon Weeks in Treatment: 14 Vital Signs Height(in): 70 Pulse(bpm): 75 Weight(lbs): 187 Blood Pressure 102/47 (mmHg): Body Mass Index(BMI): 27 Temperature(F): 97.8 Respiratory  Rate 17 (breaths/min): Photos: [N/A:N/A] Wound Location: Right Back - Proximal N/A N/A Wounding Event: Pressure Injury N/A N/A Primary Etiology: Pressure Ulcer N/A N/A Comorbid History: Coronary Artery Disease, N/A N/A Type II Diabetes, Quadriplegia, Seizure Disorder Date Acquired: 11/24/2015 N/A N/A Weeks of Treatment: 14 N/A N/A Wound Status: Open N/A N/A Measurements L x W x D 2.4x1.9x0.2 N/A N/A (cm) Area (cm) : 3.581 N/A N/A Volume (cm) : 0.716 N/A N/A % Reduction in Area: 55.50% N/A N/A % Reduction in Volume: 11.10% N/A N/A Classification: Category/Stage II N/A N/A Exudate Amount: Medium N/A N/A Exudate Type: Serous N/A  N/A Exudate Color: amber N/A N/A Wound Margin: Flat and Intact N/A N/A Granulation Amount: Medium (34-66%) N/A N/A Granulation Quality: Red, Pink N/A N/A Brunell, [name] ([id number]) Necrotic Amount: Medium (34-66%) N/A N/A Exposed Structures: Fat Layer (Subcutaneous N/A N/A Tissue) Exposed: Yes Fascia: No Tendon: No Muscle: No Joint: No Bone: No Epithelialization: Small (1-33%) N/A N/A Debridement: Debridement ([id number]- N/A N/A 11047) Pre-procedure 14:20 N/A N/A Verification/Time Out Taken: Pain Control: Lidocaine 4% Topical N/A N/A Solution Tissue Debrided: Fibrin/Slough, N/A N/A Subcutaneous Level: Skin/Subcutaneous N/A N/A Tissue Debridement Area (sq 4.56 N/A N/A cm): Instrument: Curette N/A N/A Bleeding: Minimum N/A N/A Hemostasis Achieved: Pressure N/A N/A Procedural Pain: 0 N/A N/A Post Procedural Pain: 0 N/A N/A Debridement Treatment Procedure was tolerated N/A N/A Response: well Post Debridement 2.4x1.9x0.2 N/A N/A Measurements L x W x D (cm) Post Debridement 0.716 N/A N/A Volume: (cm) Post Debridement Category/Stage II N/A N/A Stage: Periwound Skin Texture: Excoriation: Yes N/A N/A Induration: No Callus: No Crepitus: No Rash: No Scarring: No Periwound Skin Maceration: No N/A N/A Moisture: Dry/Scaly:  No Periwound Skin Color: Erythema: Yes N/A N/A Atrophie Blanche: No Cyanosis: No Ecchymosis: No Hemosiderin Staining: No Mottled: No [name], [name] ([id number]) Pallor: No Rubor: No Erythema Location: Circumferential N/A N/A Temperature: No Abnormality N/A N/A Tenderness on Yes N/A N/A Palpation: Wound Preparation: Ulcer Cleansing: N/A N/A Rinsed/Irrigated with Saline Topical Anesthetic Applied: Other: lidocaine 4% Procedures Performed: Debridement N/A N/A Treatment Notes Wound #1 (Right, Proximal Back) 1. Cleansed with: Clean wound with Normal Saline 4. Dressing Applied: Hydrafera Blue 5. Secondary Dressing Applied Bordered Foam Dressing Dry Gauze Electronic Signature(s) Signed: 05/02/2016 8:02:19 AM By: Linton Ham MD Previous Signature: 05/01/2016 5:41:40 PM Version By: Regan Lemming BSN, RN Entered By: Linton Ham on 05/02/2016 07:44:43 [place], [name] ([id number]) -------------------------------------------------------------------------------- Multi-Disciplinary Care Plan Details Patient Name: [name], [name] Date of Service: 05/01/2016 1:45 PM Medical Record Number: [id number] Patient Account Number: [id number] Date of Birth/Sex: [birth year] (66 y.o. Male) Treating RN: Baruch Gouty, RN, BSN, Velva Harman Primary Care Ritha Sampedro: Otilio Miu Other Clinician: Referring Parrish Bonn: Otilio Miu Treating Ananda Caya/Extender: Ricard Dillon Weeks in Treatment: 14 Active Inactive ` Nutrition Nursing Diagnoses: Imbalanced nutrition Impaired glucose control: actual or potential Goals: Patient/caregiver agrees to and verbalizes understanding of need to use nutritional supplements and/or vitamins as prescribed Date Initiated: 01/24/2016 Target Resolution Date: 03/27/2016 Goal Status: Active Patient/caregiver will maintain therapeutic glucose control Date Initiated: 01/24/2016 Target Resolution Date: 03/27/2016 Goal Status: Active Interventions: Assess patient nutrition  upon admission and as needed per policy Provide education on elevated blood sugars and impact on wound healing Notes: ` Orientation to the Wound Care Program Nursing Diagnoses: Knowledge deficit related to the wound healing center program Goals: Patient/caregiver will verbalize understanding of the [place] Date Initiated: 01/24/2016 Target Resolution Date: 03/27/2016 Goal Status: Active Interventions: Provide education on orientation to the wound center Notes: BART, ASHFORD ([id number]) ` Pain, Acute or Chronic Nursing Diagnoses: Pain, acute or chronic: actual or potential Potential alteration in comfort, pain Goals: Patient will verbalize adequate pain control and receive pain control interventions during procedures as needed Date Initiated: 01/24/2016 Target Resolution Date: 03/27/2016 Goal Status: Active Patient/caregiver will verbalize adequate pain control between visits Date Initiated: 01/24/2016 Target Resolution Date: 03/27/2016 Goal Status: Active Patient/caregiver will verbalize comfort level met Date Initiated: 01/24/2016 Target Resolution Date: 03/27/2016 Goal Status: Active Interventions: Assess comfort goal upon admission Complete pain assessment as per visit requirements Notes: ` Pressure Nursing Diagnoses: Knowledge deficit related  to management of pressures ulcers Potential for impaired tissue integrity related to pressure, friction, moisture, and shear Goals: Patient will remain free from development of additional pressure ulcers Date Initiated: 01/24/2016 Target Resolution Date: 03/27/2016 Goal Status: Active Interventions: Assess: immobility, friction, shearing, incontinence upon admission and as needed Assess offloading mechanisms upon admission and as needed Assess potential for pressure ulcer upon admission and as needed Provide education on pressure ulcers Notes: [name], [name] ([id number]) Wound/Skin  Impairment Nursing Diagnoses: Impaired tissue integrity Goals: Ulcer/skin breakdown will have a volume reduction of 30% by week 4 Date Initiated: 01/24/2016 Target Resolution Date: 03/27/2016 Goal Status: Active Ulcer/skin breakdown will have a volume reduction of 50% by week 8 Date Initiated: 01/24/2016 Target Resolution Date: 03/27/2016 Goal Status: Active Ulcer/skin breakdown will have a volume reduction of 80% by week 12 Date Initiated: 01/24/2016 Target Resolution Date: 03/27/2016 Goal Status: Active Interventions: Assess patient/caregiver ability to perform ulcer/skin care regimen upon admission and as needed Assess ulceration(s) every visit Notes: Electronic Signature(s) Signed: 05/01/2016 5:41:40 PM By: Regan Lemming BSN, RN Entered By: Regan Lemming on 05/01/2016 14:16:44 [place], [name] ([id number]) -------------------------------------------------------------------------------- Pain Assessment Details Patient Name: [name] Coast C. Date of Service: 05/01/2016 1:45 PM Medical Record Number: [id number] Patient Account Number: [id number] Date of Birth/Sex: 1950/11/16 (66 y.o. Male) Treating RN: Baruch Gouty, RN, BSN, Velva Harman Primary Care Latacha Texeira: Otilio Miu Other Clinician: Referring Rashidah [place]: Otilio Miu Treating Karyn Brull/Extender: Ricard Dillon Weeks in Treatment: 14 Active Problems Location of Pain Severity and Description of Pain Patient Has Paino No Site Locations With Dressing Change: No Pain Management and Medication Current Pain Management: Electronic Signature(s) Signed: 05/01/2016 5:41:40 PM By: Regan Lemming BSN, RN Entered By: Regan Lemming on 05/01/2016 14:03:16 [place], [name] ([id number]) -------------------------------------------------------------------------------- Patient/Caregiver Education Details Patient Name: [name]. Date of Service: 05/01/2016 1:45 PM Medical Record Number: [id number] Patient Account Number: [id number] Date of Birth/Gender:  1950-09-07 (66 y.o. Male) Treating RN: Baruch Gouty, RN, BSN, Velva Harman Primary Care Physician: Otilio Miu Other Clinician: Referring Physician: Otilio Miu Treating Physician/Extender: Tito Dine in Treatment: 14 Education Assessment Education Provided To: Patient Education Topics Provided Elevated Blood Sugar/ Impact on Healing: Methods: Explain/Verbal Responses: State content correctly Pressure: Methods: Explain/Verbal Responses: State content correctly Welcome To [facility] [place]: Methods: Explain/Verbal Responses: State content correctly Wound/Skin Impairment: Methods: Explain/Verbal Responses: State content correctly Electronic Signature(s) Signed: 05/02/2016 5:24:45 PM By: Regan Lemming BSN, RN Entered By: Regan Lemming on 05/01/2016 17:46:38 [name], [name] ([id number]) -------------------------------------------------------------------------------- Wound Assessment Details Patient Name: [name], [name] Date of Service: 05/01/2016 1:45 PM Medical Record Number: [id number] Patient Account Number: [id number] Date of Birth/Sex: [birth year] (66 y.o. Male) Treating RN: Baruch Gouty, RN, BSN, [place] Primary Care Demetri Kerman: Otilio Miu Other Clinician: Referring Jonovan Boedecker: Otilio Miu Treating Mcdonald Reiling/Extender: Ricard Dillon Weeks in Treatment: 14 Wound Status Wound Number: 1 Primary Pressure Ulcer Etiology: Wound Location: Right Back - Proximal Wound Open Wounding Event: Pressure Injury Status: Date Acquired: 11/24/2015 Comorbid Coronary Artery Disease, Type II Weeks Of Treatment: 14 History: Diabetes, Quadriplegia, Seizure Clustered Wound: No Disorder Photos Photo Uploaded By: Regan Lemming on 05/01/2016 16:25:34 Wound Measurements Length: (cm) 2.4 Width: (cm) 1.9 Depth: (cm) 0.2 Area: (cm) 3.581 Volume: (cm) 0.716 % Reduction in Area: 55.5% % Reduction in Volume: 11.1% Epithelialization: Small (1-33%) Tunneling: No Undermining: No Wound  Description Classification: Category/Stage II Wound Margin: Flat and Intact Exudate Amount: Medium Exudate Type: Serous Exudate Color: amber Foul Odor After Cleansing: No Slough/Fibrino Yes Wound Bed Granulation Amount: Medium (  34-66%) Exposed Structure Granulation Quality: Red, Pink Fascia Exposed: No Necrotic Amount: Medium (34-66%) Fat Layer (Subcutaneous Tissue) Exposed: Yes Necrotic Quality: Adherent Slough Tendon Exposed: No [name], [name] ([id number]) Muscle Exposed: No Joint Exposed: No Bone Exposed: No Periwound Skin Texture Texture Color No Abnormalities Noted: No No Abnormalities Noted: No Callus: No Atrophie Blanche: No Crepitus: No Cyanosis: No Excoriation: Yes Ecchymosis: No Induration: No Erythema: Yes Rash: No Erythema Location: Circumferential Scarring: No Hemosiderin Staining: No Mottled: No Moisture Pallor: No No Abnormalities Noted: No Rubor: No Dry / Scaly: No Maceration: No Temperature / Pain Temperature: No Abnormality Tenderness on Palpation: Yes Wound Preparation Ulcer Cleansing: Rinsed/Irrigated with Saline Topical Anesthetic Applied: Other: lidocaine 4%, Treatment Notes Wound #1 (Right, Proximal Back) 1. Cleansed with: Clean wound with Normal Saline 4. Dressing Applied: Hydrafera Blue 5. Secondary Dressing Applied Bordered Foam Dressing Dry Gauze Electronic Signature(s) Signed: 05/01/2016 5:41:40 PM By: Regan Lemming BSN, RN Entered By: Regan Lemming on 05/01/2016 14:10:12 [place], [name] ([id number]) -------------------------------------------------------------------------------- [place] Details Patient Name: [name] Coast C. Date of Service: 05/01/2016 1:45 PM Medical Record Number: [id number] Patient Account Number: [id number] Date of Birth/Sex: 1950/05/02 (66 y.o. Male) Treating RN: Baruch Gouty, RN, BSN, [place] Primary Care Akeyla Molden: Otilio Miu Other Clinician: Referring Gentri Guardado: Otilio Miu Treating Daionna Crossland/Extender:  Ricard Dillon Weeks in Treatment: 14 Vital Signs Time Taken: 14:03 Temperature (F): 97.8 Height (in): 70 Pulse (bpm): 75 Weight (lbs): 187 Respiratory Rate (breaths/min): 17 Body Mass Index (BMI): 26.8 Blood Pressure (mmHg): 102/47 Reference Range: 80 - 120 mg / dl Electronic Signature(s) Signed: 05/01/2016 5:41:40 PM By: Regan Lemming BSN, RN Entered By: Regan Lemming on 05/01/2016 14:05:52

## 2016-05-03 NOTE — Progress Notes (Signed)
[name] ([id number]) Visit Report for 05/01/2016 Chief Complaint Document Details Patient Name: [name], [name]. Date of Service: 05/01/2016 1:45 PM Medical Record Number: [id number] Patient Account Number: [id number] Date of Birth/Sex: 08/09/1950 (66 y.o. Male) Treating RN: Clover Mealy, RN, BSN, [place] Sink Primary Care Provider: Elizabeth Sauer Other Clinician: Referring Provider: Elizabeth Sauer Treating Provider/Extender: Maxwell Caul Weeks in Treatment: 14 Information Obtained from: Patient Chief Complaint 01/24/16 patient arrives today for review of a pressure ulcer on his right scapula in the setting of incomplete C3-C4 quadriplegia Electronic Signature(s) Signed: 05/02/2016 8:02:19 AM By: Baltazar Najjar MD Entered By: Baltazar Najjar on 05/02/2016 07:45:06 [name] ([id number]) -------------------------------------------------------------------------------- Debridement Details Patient Name: [name] Bras C. Date of Service: 05/01/2016 1:45 PM Medical Record Number: [id number] Patient Account Number: [id number] Date of Birth/Sex: Aug 08, 1950 (66 y.o. Male) Treating RN: Afful, RN, BSN, Rita Primary Care Provider: Elizabeth Sauer Other Clinician: Referring Provider: Elizabeth Sauer Treating Provider/Extender: [place]e [place] in Treatment: 14 Debridement Performed for Wound #1 Right,Proximal Back Assessment: Performed By: Physician Maxwell Caul, MD Debridement: Debridement Pre-procedure Yes - 14:20 Verification/Time Out Taken: Start Time: 14:20 Pain Control: Lidocaine 4% Topical Solution Level: Skin/Subcutaneous Tissue Total Area Debrided (L x 2.4 (cm) x 1.9 (cm) = 4.56 (cm) W): Tissue and other Non-Viable, Fibrin/Slough, Subcutaneous material debrided: Instrument: Curette Bleeding: Minimum Hemostasis Achieved: Pressure End Time: 14:23 Procedural Pain: 0 Post Procedural Pain: 0 Response to Treatment: Procedure was tolerated well Post Debridement  Measurements of Total Wound Length: (cm) 2.4 Stage: Category/Stage II Width: (cm) 1.9 Depth: (cm) 0.2 Volume: (cm) 0.716 Character of Wound/Ulcer Post Requires Further Debridement: Debridement Severity of Tissue Post Fat layer exposed Debridement: Post Procedure Diagnosis Same as Pre-procedure Electronic Signature(s) Signed: 05/02/2016 8:02:19 AM By: Baltazar Najjar MD Signed: 05/02/2016 5:24:45 PM By: Elpidio Eric BSN, RN Previous Signature: 05/01/2016 5:41:40 PM Version By: Elpidio Eric BSN, RN [address], [place] ([id number]) Entered By: Baltazar Najjar on 05/02/2016 07:44:58 Lentz, Fanny [name] ([id number]) -------------------------------------------------------------------------------- HPI Details Patient Name: [name], [name] Date of Service: 05/01/2016 1:45 PM Medical Record Number: [id number] Patient Account Number: [id number] Date of Birth/Sex: 1950-11-01 (66 y.o. 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 He has lower extremity sensation and can move his legs but has no/limited control over his arms. His wife accompanies him today and states that in the late spring or early summer of 2017 the patient became very depressed. He refused the refused to mobilize and he developed several pressure ulcers on his back. Most of these have healed however they have a recalcitrant area over the right scapula. They've been using Santyl on this for at least the last month. In terms of depression the patient is doing better now on an antidepressant. He saw his primary physician on 01/12/16 at which time there was apparently green drainage coming out of this area  [Dr. Deana Jones]. Dr. Yetta Barre works in the [facility]. He has completed this Septra. Otherwise [facility] that he has a history of seizures. He also had a stroke in 2008 he follows with neurology. He also has type 2 diabetes on Glucophage, hyperlipidemia and gastroesophageal reflux. He takes Plavix for stroke  prevention and Keppra for seizure prophylaxis. 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 02/08/16; this is a patient with a pressure ulcer over the right scapula. His wife has been doing the dressing with Santyl and border foam change every second day. When he arrived here 2 weeks ago we did a fairly extensive mechanical debridement. We are asked medical modalities to go out to the home and see what they might be eligible for in terms of pressure-relief surfaces [level 2] but the wife states that they have not heard from them. 03/20/15; this is a patient we haven't seen in 5-6 weeks. This was largely due to transportation issues. They've been using Santyl and border foam changing every second day for a pressure injury over the right scapula. The patient has a C3-C4 spinal injury. We had also asked for a review by the people who supplied DME to look at his wheelchair cushion, mattress etc. I don't know that this ever happened. In the meantime the wound has done remarkably well current measurements 1.8 x 2 x 0.1 04/03/16; the patient's dimensions have gone up to 3 cm in diameter quite a deterioration from last time. He is also complaining of pain in this area which is new. 04/10/16; 3 x 1.5 x 0.1. No difference from last week. Culture I did last week was negative he has completed antibiotics. 04/24/16 2.4 x 2 x 0.1; wound generally looks smaller. Middle area that I had to debrided last time looks healthier. His son is fashioned a large piece of foam cut out where the patient's  wound with hit the back of his wheelchair 05/01/16; wound is a same size however the surface of this looks better. The middle innkeeper area required a repeat debridement Electronic Signature(s) Signed: 05/02/2016 8:02:19 AM By: Baltazar Najjar MD Entered By: Baltazar Najjar on 05/02/2016 07:45:49 Mckillop, JORDIE SCHREUR ([id number]) ARLESS, VINEYARD ([id number]) -------------------------------------------------------------------------------- Physical Exam Details Patient Name: KIEFER, OPHEIM C. Date of Service: 05/01/2016 1:45 PM Medical Record Number: [id number] Patient Account Number: [id number] Date of Birth/Sex: 1950-08-10 (66 y.o. Male) Treating RN: Clover Mealy, RN, BSN, [place] Sink Primary Care Provider: Elizabeth Sauer Other Clinician: Referring Provider: Elizabeth Sauer Treating Provider/Extender: Maxwell Caul Weeks in Treatment: 14 Constitutional Sitting or standing Blood Pressure is within target range for patient.. Pulse regular and within target range for patient.Marland Kitchen Respirations regular, non-labored and within target range.. Temperature is normal and within the target range for the patient.. Patient's appearance is neat and clean. Appears in no acute distress. Well nourished and well developed.. Notes On exam; patient's wound over the right scapula, a pressure ulcer in the setting of his advanced cervical spine injury. There is an area in the middle again that has more depth and less viable tissue. The necrotic material debrided lightly with a curette. The surrounding tissue appears healthy and it looks like there is the beginning attempts to epithelialized this area. There is no evidence of surrounding cellulitis no crepitus and no tenderness. Electronic Signature(s) Signed: 05/02/2016 8:02:19 AM By: Baltazar Najjar MD Entered By: Baltazar Najjar on 05/02/2016 07:47:06 Mangen, Fanny [name] ([id number]) -------------------------------------------------------------------------------- Physician Orders  Details Patient Name: ALEE, KATEN. Date of Service: 05/01/2016 1:45 PM Medical Record Number: [id number] Patient Account Number: [id number] Date of Birth/Sex: 1950-11-12 (66 y.o. Male) Treating RN: Clover Mealy, RN, BSN,  Sink Primary Care Provider: Elizabeth Sauer Other Clinician: Referring Provider: Elizabeth Sauer Treating Provider/Extender: Maxwell Caul Weeks in Treatment: 14 Verbal /  Phone Orders: No Diagnosis Coding Wound Cleansing Wound #1 Right,Proximal Back o Clean wound with Normal Saline. o Cleanse wound with mild soap and water Anesthetic Wound #1 Right,Proximal Back o Topical Lidocaine 4% cream applied to wound bed prior to debridement - CLINIC USE Skin Barriers/Peri-Wound Care Wound #1 Right,Proximal Back o Skin Prep Primary Wound Dressing Wound #1 Right,Proximal Back o Hydrafera Blue Secondary Dressing Wound #1 Right,Proximal Back o Dry Gauze o Boardered Foam Dressing Dressing Change Frequency Wound #1 Right,Proximal Back o Change dressing every other day. Follow-up Appointments Wound #1 Right,Proximal Back o Return Appointment in 1 week. Off-Loading Wound #1 Right,Proximal Back o Matt[employer] does not cover because they out of network. Patient wife to call the insurance company to find out with DME company is in network. o Turn and reposition every 2 hours Marteney, Sandy C. ([id number]) o Other: - Pad for wheelchair for positioning FELT USE TODAY IN THE CLINIC Additional Orders / Instructions Wound #1 Right,Proximal Back o Increase protein intake. Medications-please add to medication list. Wound #1 Right,Proximal Back o Other: - VITAMIN C, ZINC, VIt A, MVI Electronic Signature(s) Signed: 05/01/2016 5:41:40 PM By: Elpidio Eric BSN, RN Signed: 05/02/2016 8:02:19 AM By: Baltazar Najjar MD Entered By: Elpidio Eric on 05/01/2016 14:21:12 Hoke, Fanny [name]  ([id number]) -------------------------------------------------------------------------------- Problem List Details Patient Name: Wexler, Garey C. Date of Service: 05/01/2016 1:45 PM Medical Record Number: [id number] Patient Account Number: [id number] Date of Birth/Sex: [birth year] (66 y.o. Male) Treating RN: Clover Mealy, RN, BSN, [place] Sink Primary Care Provider: Elizabeth Sauer Other Clinician: Referring Provider: Elizabeth Sauer Treating Provider/Extender: Maxwell Caul Weeks in Treatment: 14 Active Problems ICD-10 Encounter Code Description Active Date Diagnosis 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, stage 3 01/24/2016 Yes S14.103S Unspecified injury at C3 level of cervical spinal cord, 01/24/2016 Yes sequela Inactive Problems Resolved Problems Electronic Signature(s) Signed: 05/02/2016 8:02:19 AM By: Baltazar Najjar MD Entered By: Baltazar Najjar on 05/02/2016 07:44:35 Chrestman, Fanny [name] ([id number]) -------------------------------------------------------------------------------- Progress Note Details Patient Name: [name] Bras C. Date of Service: 05/01/2016 1:45 PM Medical Record Number: [id number] Patient Account Number: [id number] Date of Birth/Sex: [birth year] (66 y.o. 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 He has lower extremity sensation and can move his legs but has no/limited control over his arms. His wife accompanies him today and states that in the late spring or  early summer of 2017 the patient became very depressed. He refused the refused to mobilize and he developed several pressure ulcers on his back. Most of these have healed however they have a recalcitrant area over the right scapula. They've been using Santyl on this for at least the last month. In terms of depression the patient is doing better now on an antidepressant. He saw his primary physician on 01/12/16 at which time there was apparently green drainage coming out of this area [Dr. Deana Jones]. Dr. Yetta Barre works in the [facility]. He has completed this Septra. Otherwise [facility] that he has a history of seizures. He also  had a stroke in 2008 he follows with neurology. He also has type 2 diabetes on Glucophage, hyperlipidemia and gastroesophageal reflux. He takes Plavix for stroke prevention and Keppra for seizure prophylaxis. 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 02/08/16; this is a patient with a pressure ulcer over the right scapula. His wife has been doing the dressing with Santyl and border foam change every second day. When he arrived here 2 weeks ago we did a fairly extensive mechanical debridement. We are asked medical modalities to go out to the home and see what they might be eligible for in terms of pressure-relief surfaces [level 2] but the wife states that they have not heard from them. 03/20/15; this is a patient we haven't seen in 5-6 weeks. This was largely due to transportation issues. They've been using Santyl and border foam changing every second day for a pressure injury over the right scapula. The patient has a C3-C4 spinal injury. We had also asked for a review by the people who supplied DME to look at his wheelchair cushion, mattress etc. I don't know that this ever happened. In the meantime the wound has done remarkably well current  measurements 1.8 x 2 x 0.1 04/03/16; the patient's dimensions have gone up to 3 cm in diameter quite a deterioration from last time. He is also complaining of pain in this area which is new. 04/10/16; 3 x 1.5 x 0.1. No difference from last week. Culture I did last week was negative he has completed antibiotics. 04/24/16 2.4 x 2 x 0.1; wound generally looks smaller. Middle area that I had to debrided last time looks healthier. His son is fashioned a large piece of foam cut out where the patient's wound with hit the back of his wheelchair 05/01/16; wound is a same size however the surface of this looks better. The middle innkeeper area required [facility]. ([id number]) a repeat debridement Objective Constitutional Sitting or standing Blood Pressure is within target range for patient.. Pulse regular and within target range for patient.Marland Kitchen Respirations regular, non-labored and within target range.. Temperature is normal and within the target range for the patient.. Patient's appearance is neat and clean. Appears in no acute distress. Well nourished and well developed.. Vitals Time Taken: 2:03 PM, Height: 70 in, Weight: 187 lbs, BMI: 26.8, Temperature: 97.8 F, Pulse: 75 bpm, Respiratory Rate: 17 breaths/min, Blood Pressure: 102/47 mmHg. General Notes: On exam; patient's wound over the right scapula, a pressure ulcer in the setting of his advanced cervical spine injury. There is an area in the middle again that has more depth and less viable tissue. The necrotic material debrided lightly with a curette. The surrounding tissue appears healthy and it looks like there is the beginning attempts to epithelialized this area. There is no evidence of surrounding cellulitis no crepitus and no tenderness. Integumentary (Hair, Skin) Wound #1 status is Open. Original cause of wound was Pressure Injury. The wound is located on the Right,Proximal Back. The wound measures 2.4cm length x 1.9cm width x 0.2cm  depth; 3.581cm^2 area and 0.716cm^3 volume. There is Fat Layer (Subcutaneous Tissue) Exposed exposed. There is no tunneling or undermining noted. There is a medium amount of serous drainage noted. The wound margin is flat and intact. There is medium (34-66%) red, pink granulation within the wound bed. There is a medium (34-66%) amount of necrotic tissue within the wound bed including Adherent Slough. The periwound skin  appearance exhibited: Excoriation, Erythema. The periwound skin appearance did not exhibit: Callus, Crepitus, Induration, Rash, Scarring, Dry/Scaly, Maceration, Atrophie Blanche, Cyanosis, Ecchymosis, Hemosiderin Staining, Mottled, Pallor, Rubor. The surrounding wound skin color is noted with erythema which is circumferential. Periwound temperature was noted as No Abnormality. The periwound has tenderness on palpation. 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, stage 3 S14.103S - Unspecified injury at C3 level of cervical spinal cord, sequela Sienkiewicz, Montavis C. ([id number]) Procedures Wound #1 Wound #1 is a Pressure Ulcer located on the Right,Proximal Back . There was a Skin/Subcutaneous Tissue Debridement ([id number]) debridement with total area of 4.56 sq cm performed by Maxwell CaulOBSON, Sherry Blackard G, MD. with the following instrument(s): Curette to remove Non-Viable tissue/material including Fibrin/Slough and Subcutaneous after achieving pain control using Lidocaine 4% Topical Solution. A time out was conducted at 14:20, prior to the start of the procedure. A Minimum amount of bleeding was controlled with Pressure. The procedure was tolerated well with a pain level of 0 throughout and a pain level of 0 following the procedure. Post Debridement Measurements: 2.4cm length x 1.9cm width x 0.2cm depth; 0.716cm^3 volume. Post debridement Stage noted as Category/Stage II. Character of Wound/Ulcer Post Debridement requires further debridement. Severity of Tissue  Post Debridement is: Fat layer exposed. Post procedure Diagnosis Wound #1: Same as Pre-Procedure Plan Wound Cleansing: Wound #1 Right,Proximal Back: Clean wound with Normal Saline. Cleanse wound with mild soap and water Anesthetic: Wound #1 Right,Proximal Back: Topical Lidocaine 4% cream applied to wound bed prior to debridement - CLINIC USE Skin Barriers/Peri-Wound Care: Wound #1 Right,Proximal Back: Skin Prep Primary Wound Dressing: Wound #1 Right,Proximal Back: Hydrafera Blue Secondary Dressing: Wound #1 Right,Proximal Back: Dry Gauze Boardered Foam Dressing Dressing Change Frequency: Wound #1 Right,Proximal Back: Change dressing every other day. Follow-up Appointments: Wound #1 Right,Proximal Back: [name], Ritvik C. ([id number]) Return Appointment in 1 week. Off-Loading: Wound #1 Right,Proximal Back: Matt[employer] does not cover because they out of network. Patient wife to call the insurance company to find out with DME company is in network. Turn and reposition every 2 hours Other: - Pad for wheelchair for positioning FELT USE TODAY IN THE CLINIC Additional Orders / Instructions: Wound #1 Right,Proximal Back: Increase protein intake. Medications-please add to medication list.: Wound #1 Right,Proximal Back: Other: - VITAMIN C, ZINC, VIt A, MVI #1 continue Hydrofera Blue and border foam I think we're making progress here. Electronic Signature(s) Signed: 05/02/2016 8:02:19 AM By: Baltazar Najjarobson, Daviana Haymaker MD Entered By: Baltazar Najjarobson, Fairley Copher on 05/02/2016 07:47:37 [place], [name] ([id number]) -------------------------------------------------------------------------------- SuperBill Details Patient Name: [name] BrasHATCH, Darrik C. Date of Service: 05/01/2016 Medical Record Number: [id number] Patient Account Number: [id number] Date of Birth/Sex: 23-May-1950 51(65 y.o. Male) Treating RN: Clover MealyAfful, RN, BSN, [place] Primary Care Provider: Elizabeth SauerJones, Deanna Other Clinician: Referring Provider:  Elizabeth SauerJones, Deanna Treating Provider/Extender: Maxwell CaulOBSON, Indio Santilli G Service Line: Outpatient Weeks in Treatment: 14 Diagnosis Coding ICD-10 Codes Code Description 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, stage 3 S14.103S Unspecified injury at C3 level of cervical spinal cord, sequela Facility Procedures CPT4 Code: [id number] Description: 11042 - DEB SUBQ TISSUE 20 SQ CM/< ICD-10 Description Diagnosis 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, stage 3 Modifier: Quantity: 1 Physician Procedures CPT4 Code: [id number] Description: 11042 - WC PHYS SUBQ TISS 20 SQ CM ICD-10 Description Diagnosis 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, stage 3 Modifier: Quantity: 1 Electronic Signature(s) Signed: 05/02/2016 8:02:19 AM By: Baltazar Najjarobson, Lille Karim MD Entered By: Baltazar Najjarobson, Izak Anding on 05/02/2016 07:47:53

## 2016-05-08 ENCOUNTER — Other Ambulatory Visit: Payer: Self-pay

## 2016-05-08 ENCOUNTER — Other Ambulatory Visit: Payer: Self-pay | Admitting: Family Medicine

## 2016-05-14 ENCOUNTER — Other Ambulatory Visit: Payer: Self-pay

## 2016-05-14 MED ORDER — PANTOPRAZOLE SODIUM 40 MG PO TBEC: 40.0000 mg | DELAYED_RELEASE_TABLET | Freq: Every day | ORAL | 0 refills | Status: DC

## 2016-05-15 ENCOUNTER — Encounter: Payer: Self-pay | Admitting: Family Medicine

## 2016-05-15 ENCOUNTER — Ambulatory Visit (INDEPENDENT_AMBULATORY_CARE_PROVIDER_SITE_OTHER): Payer: Medicare Other | Admitting: Family Medicine

## 2016-05-15 ENCOUNTER — Encounter: Payer: Medicare Other | Admitting: Internal Medicine

## 2016-05-15 VITALS — BP 110/62 | HR 68

## 2016-05-15 DIAGNOSIS — E785 Hyperlipidemia, unspecified: Secondary | ICD-10-CM | POA: Diagnosis not present

## 2016-05-15 DIAGNOSIS — R69 Illness, unspecified: Secondary | ICD-10-CM | POA: Diagnosis not present

## 2016-05-15 DIAGNOSIS — L89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, stage 3: Secondary | ICD-10-CM | POA: Diagnosis not present

## 2016-05-15 DIAGNOSIS — G8250 Quadriplegia, unspecified: Secondary | ICD-10-CM | POA: Diagnosis not present

## 2016-05-15 DIAGNOSIS — L89113 Pressure ulcer of right upper back, stage 3: Secondary | ICD-10-CM | POA: Diagnosis not present

## 2016-05-15 DIAGNOSIS — Z7984 Long term (current) use of oral hypoglycemic drugs: Secondary | ICD-10-CM | POA: Diagnosis not present

## 2016-05-15 DIAGNOSIS — F4321 Adjustment disorder with depressed mood: Secondary | ICD-10-CM | POA: Diagnosis not present

## 2016-05-15 DIAGNOSIS — R600 Localized edema: Secondary | ICD-10-CM

## 2016-05-15 DIAGNOSIS — R739 Hyperglycemia, unspecified: Secondary | ICD-10-CM | POA: Diagnosis not present

## 2016-05-15 DIAGNOSIS — S14103S Unspecified injury at C3 level of cervical spinal cord, sequela: Secondary | ICD-10-CM | POA: Diagnosis not present

## 2016-05-15 DIAGNOSIS — E119 Type 2 diabetes mellitus without complications: Secondary | ICD-10-CM | POA: Diagnosis not present

## 2016-05-15 DIAGNOSIS — Z8673 Personal history of transient ischemic attack (TIA), and cerebral infarction without residual deficits: Secondary | ICD-10-CM | POA: Diagnosis not present

## 2016-05-15 DIAGNOSIS — R569 Unspecified convulsions: Secondary | ICD-10-CM | POA: Diagnosis not present

## 2016-05-15 DIAGNOSIS — R7303 Prediabetes: Secondary | ICD-10-CM

## 2016-05-15 DIAGNOSIS — K219 Gastro-esophageal reflux disease without esophagitis: Secondary | ICD-10-CM

## 2016-05-15 MED ORDER — FUROSEMIDE 20 MG PO TABS: 20.0000 mg | ORAL_TABLET | Freq: Every day | ORAL | 1 refills | Status: AC

## 2016-05-15 MED ORDER — METFORMIN HCL 500 MG PO TABS: 500.0000 mg | ORAL_TABLET | Freq: Two times a day (BID) | ORAL | 3 refills | Status: DC

## 2016-05-15 MED ORDER — PANTOPRAZOLE SODIUM 40 MG PO TBEC: 40.0000 mg | DELAYED_RELEASE_TABLET | Freq: Every day | ORAL | 1 refills | Status: DC

## 2016-05-15 MED ORDER — ATORVASTATIN CALCIUM 10 MG PO TABS: 10.0000 mg | ORAL_TABLET | Freq: Every day | ORAL | 1 refills | Status: DC

## 2016-05-15 MED ORDER — CLOPIDOGREL BISULFATE 75 MG PO TABS: 75.0000 mg | ORAL_TABLET | Freq: Every day | ORAL | 1 refills | Status: DC

## 2016-05-15 NOTE — Progress Notes (Signed)
Name: [name]   MRN: [id number]    DOB: September 16, 1950   Date:05/15/2016       Progress Note  Subjective  Chief Complaint  Chief Complaint  Patient presents with  . Gastroesophageal Reflux    Refill Protonix  . Cerebrovascular Accident    Refill Plavix  . Hyperlipidemia  . Diabetes    Gastroesophageal Reflux  He reports no abdominal pain, no belching, no chest pain, no choking, no coughing, no dysphagia, no early satiety, no globus sensation, no heartburn, no hoarse voice, no nausea, no sore throat, no stridor, no tooth decay, no water brash or no wheezing. This is a chronic problem. The current episode started more than 1 year ago. The problem occurs constantly. The problem has been waxing and waning. The symptoms are aggravated by certain foods. Pertinent negatives include no anemia, fatigue, melena, muscle weakness, orthopnea or weight loss. There are no known risk factors. He has tried a PPI for the symptoms. The treatment provided moderate relief.  Cerebrovascular Accident  This is a chronic problem. The current episode started more than 1 year ago. The problem occurs intermittently. The problem has been waxing and waning. Associated symptoms include urinary symptoms and weakness. Pertinent negatives include no abdominal pain, anorexia, arthralgias, change in bowel habit, chest pain, chills, congestion, coughing, diaphoresis, fatigue, fever, headaches, joint swelling, myalgias, nausea, neck pain, numbness, rash, sore throat or visual change.  Hyperlipidemia  This is a chronic problem. The current episode started more than 1 year ago. The problem is controlled. Recent lipid tests were reviewed and are normal. He has no history of chronic renal disease, diabetes, hypothyroidism, liver disease, obesity or nephrotic syndrome. There are no known factors aggravating his hyperlipidemia. Pertinent negatives include no chest pain, focal sensory loss, focal weakness, leg pain, myalgias or  shortness of breath. (Paralysis) The current treatment provides moderate improvement of lipids. There are no compliance problems.   Diabetes  He presents for his follow-up diabetic visit. He has type 2 diabetes mellitus. His disease course has been stable. Pertinent negatives for hypoglycemia include no confusion, dizziness, headaches, hunger, mood changes, nervousness/anxiousness, pallor, seizures, sleepiness, speech difficulty, sweats or tremors. Associated symptoms include weakness. Pertinent negatives for diabetes include no blurred vision, no chest pain, no fatigue, no foot paresthesias, no foot ulcerations, no polydipsia, no polyphagia, no polyuria, no visual change and no weight loss. There are no hypoglycemic complications. Symptoms are stable. There are no diabetic complications. Risk factors for coronary artery disease include dyslipidemia. His weight is stable. He is following a generally healthy diet. His breakfast blood glucose is taken between 8-9 am. His breakfast blood glucose range is generally 110-130 mg/dl. An ACE inhibitor/angiotensin II receptor blocker is not being taken. He does not see a podiatrist.Eye exam is not current.  Depression       The patient presents with depression.  This is a new problem.  The current episode started more than 1 month ago.   The onset quality is gradual.   The problem has been rapidly improving since onset.  Associated symptoms include no fatigue, no helplessness, no hopelessness, does not have insomnia, not irritable, no decreased interest, no myalgias, no headaches and no suicidal ideas.  Past treatments include SSRIs - Selective serotonin reuptake inhibitors (remeron recently added).  Compliance with treatment is good.  Previous treatment provided mild relief.  Past medical history includes depression.     Pertinent negatives include no hypothyroidism.   No problem-specific Assessment & Plan notes  found for this encounter.   Past Medical History:   Diagnosis Date  . Allergy   . Anxiety   . CAD (coronary artery disease)   . Diabetes mellitus without complication (HCC)   . GERD (gastroesophageal reflux disease)   . Hyperlipidemia   . Quadriplegia (HCC)   . Seizures (HCC)   . Spinal cord disease (HCC)   . Stroke [employer](HCC)     Past Surgical History:  Procedure Laterality Date  . ENDARTERECTOMY    . ENDARTERECTOMY    . TRACHEOSTOMY      Family History  Problem Relation Age of Onset  . Stroke Father     Social History   Social History  . Marital status: Married    Spouse name: N/A  . Number of children: N/A  . Years of education: N/A   Occupational History  . Not on file.   Social History Main Topics  . Smoking status: Never Smoker  . Smokeless tobacco: Never Used  . Alcohol use No  . Drug use: No  . Sexual activity: Not on file   Other Topics Concern  . Not on file   Social History Narrative  . No narrative on file    No Known Allergies  Outpatient Medications Prior to Visit  Medication Sig Dispense Refill  . citalopram (CELEXA) 20 MG tablet TAKE 1 TABLET (20 MG TOTAL) BY MOUTH DAILY. 30 tablet 0  . diazepam (VALIUM) 5 MG tablet Take 1 tablet by mouth 3 (three) times daily. Dr. Carlena HurlFiler    . levETIRAcetam (KEPPRA) 500 MG tablet Take 1 tablet (500 mg total) by mouth 2 (two) times daily. 180 tablet 0  . oxybutynin (DITROPAN) 5 MG tablet Take 1 tablet by mouth 3  times daily (Patient taking differently: Take 1 tablet by mouth 3  times daily [facility]) 270 tablet 0  . tizanidine (ZANAFLEX) 6 MG capsule Take 1 capsule by mouth 2 (two) times daily.    Marland Kitchen. atorvastatin (LIPITOR) 10 MG tablet Take 1 tablet by mouth  daily 90 tablet 0  . clopidogrel (PLAVIX) 75 MG tablet Take 1 tablet by mouth  daily 90 tablet 0  . furosemide (LASIX) 20 MG tablet Take 1 tablet (20 mg total) by mouth daily. 90 tablet 1  . metFORMIN (GLUCOPHAGE) 500 MG tablet Take 1 tablet (500 mg total) by mouth 2 (two) times daily with a meal.  180 tablet 3  . pantoprazole (PROTONIX) 40 MG tablet Take 1 tablet (40 mg total) by mouth daily. 90 tablet 0  . ALPRAZolam (XANAX) 0.25 MG tablet Take 1 tablet (0.25 mg total) by mouth at bedtime. (Patient not taking: Reported on 05/15/2016) 30 tablet 5  . buPROPion (WELLBUTRIN) 75 MG tablet Take 1 tablet by mouth two  times daily (Patient not taking: Reported on 05/15/2016) 180 tablet 0  . cephALEXin (KEFLEX) 500 MG capsule Take 1 capsule (500 mg total) by mouth 3 (three) times daily. X 5 days (Patient not taking: Reported on 05/15/2016) 15 capsule 0  . furosemide (LASIX) 20 MG tablet TAKE 1 TABLET (20 MG TOTAL) BY MOUTH DAILY. (Patient not taking: Reported on 05/15/2016) 30 tablet 0  . furosemide (LASIX) 20 MG tablet TAKE 1 TABLET BY MOUTH EVERY DAY (Patient not taking: Reported on 05/15/2016) 90 tablet 0  . sulfamethoxazole-trimethoprim (BACTRIM DS,SEPTRA DS) 800-160 MG tablet Take 1 tablet by mouth 2 (two) times daily. (Patient not taking: Reported on 05/15/2016) 14 tablet 0   No facility-administered medications prior to visit.  Review of Systems  Constitutional: Negative for chills, diaphoresis, fatigue, fever, malaise/fatigue and weight loss.  HENT: Negative for congestion, ear discharge, ear pain, hoarse voice and sore throat.   Eyes: Negative for blurred vision.  Respiratory: Negative for cough, sputum production, choking, shortness of breath and wheezing.   Cardiovascular: Negative for chest pain, palpitations and leg swelling.  Gastrointestinal: Negative for abdominal pain, anorexia, blood in stool, change in bowel habit, constipation, diarrhea, dysphagia, heartburn, melena and nausea.  Genitourinary: Negative for dysuria, frequency, hematuria and urgency.  Musculoskeletal: Negative for arthralgias, back pain, joint pain, joint swelling, myalgias, muscle weakness and neck pain.  Skin: Negative for pallor and rash.  Neurological: Positive for weakness. Negative for dizziness, tingling,  tremors, sensory change, focal weakness, seizures, speech difficulty, numbness and headaches.  Endo/Heme/Allergies: Negative for environmental allergies, polydipsia and polyphagia. Does not bruise/bleed easily.  Psychiatric/Behavioral: Negative for confusion, depression and suicidal ideas. The patient is not nervous/anxious and does not have insomnia.      Objective  Vitals:   05/15/16 1042  BP: 110/62  Pulse: 68    Physical Exam  Constitutional: He is oriented to person, place, and time and well-developed, well-nourished, and in no distress. He is not irritable.  HENT:  Head: Normocephalic.  Right Ear: External ear normal.  Left Ear: External ear normal.  Nose: Nose normal.  Mouth/Throat: Oropharynx is clear and moist.  Eyes: Conjunctivae and EOM are normal. Pupils are equal, round, and reactive to light. Right eye exhibits no discharge. Left eye exhibits no discharge. No scleral icterus.  Neck: Normal range of motion. Neck supple. No JVD present. No tracheal deviation present. No thyromegaly present.  Cardiovascular: Normal rate, regular rhythm, normal heart sounds and intact distal pulses.  Exam reveals no gallop and no friction rub.   No murmur heard. Pulmonary/Chest: Breath sounds normal. No respiratory distress. He has no wheezes. He has no rales.  Abdominal: Soft. Bowel sounds are normal. He exhibits no mass. There is no hepatosplenomegaly. There is no tenderness. There is no rebound, no guarding and no CVA tenderness.  Musculoskeletal: Normal range of motion. He exhibits no edema or tenderness.  Lymphadenopathy:    He has no cervical adenopathy.  Neurological: He is alert and oriented to person, place, and time. He has normal sensation, normal strength, normal reflexes and intact cranial nerves. No cranial nerve deficit.  Skin: Skin is warm. No rash noted.  Psychiatric: Mood and affect normal.  Nursing note and vitals reviewed.     Assessment & Plan  Problem List  Items Addressed This Visit      Digestive   GERD (gastroesophageal reflux disease) - Primary   Relevant Medications   pantoprazole (PROTONIX) 40 MG tablet     Nervous and Auditory   Quadriplegia (HCC)   Relevant Medications   clopidogrel (PLAVIX) 75 MG tablet     Other   Adjustment disorder with depressed mood   HLD (hyperlipidemia)   Relevant Medications   furosemide (LASIX) 20 MG tablet   atorvastatin (LIPITOR) 10 MG tablet   Other Relevant Orders   Lipid Profile    Other Visit Diagnoses    Hyperglycemia       Relevant Medications   metFORMIN (GLUCOPHAGE) 500 MG tablet   Other Relevant Orders   Hemoglobin A1c   Renal Function Panel   Prediabetes       Relevant Medications   metFORMIN (GLUCOPHAGE) 500 MG tablet   Other Relevant Orders   Hemoglobin A1c   Localized  edema       Relevant Medications   furosemide (LASIX) 20 MG tablet   Taking medication for chronic disease       Relevant Orders   Renal Function Panel   Hepatic Function Panel (6)   CBC w/Diff/Platelet      Meds ordered this encounter  Medications  . metFORMIN (GLUCOPHAGE) 500 MG tablet    Sig: Take 1 tablet (500 mg total) by mouth 2 (two) times daily with a meal.    Dispense:  180 tablet    Refill:  3  . pantoprazole (PROTONIX) 40 MG tablet    Sig: Take 1 tablet (40 mg total) by mouth daily.    Dispense:  90 tablet    Refill:  1  . furosemide (LASIX) 20 MG tablet    Sig: Take 1 tablet (20 mg total) by mouth daily.    Dispense:  90 tablet    Refill:  1  . clopidogrel (PLAVIX) 75 MG tablet    Sig: Take 1 tablet (75 mg total) by mouth daily.    Dispense:  90 tablet    Refill:  1  . atorvastatin (LIPITOR) 10 MG tablet    Sig: Take 1 tablet (10 mg total) by mouth daily.    Dispense:  90 tablet    Refill:  1      Dr. [facility]  05/15/16

## 2016-05-16 LAB — RENAL FUNCTION PANEL
Albumin: 3.6 g/dL (ref 3.6–4.8)
BUN / CREAT RATIO: 26 — AB (ref 10–24)
BUN: 17 mg/dL (ref 8–27)
CO2: 25 mmol/L (ref 18–29)
CREATININE: 0.66 mg/dL — AB (ref 0.76–1.27)
Calcium: 8.8 mg/dL (ref 8.6–10.2)
Chloride: 95 mmol/L — ABNORMAL LOW (ref 96–106)
GFR calc Af Amer: 116 mL/min/{1.73_m2} (ref >59)
GFR calc non Af Amer: 101 mL/min/{1.73_m2} (ref >59)
Glucose: 110 mg/dL — ABNORMAL HIGH (ref 65–99)
Phosphorus: 2.7 mg/dL (ref 2.5–4.5)
Potassium: 4.2 mmol/L (ref 3.5–5.2)
SODIUM: 135 mmol/L (ref 134–144)

## 2016-05-16 LAB — CBC WITH DIFFERENTIAL/PLATELET
BASOS: 0 %
Basophils Absolute: 0 10*3/uL (ref 0.0–0.2)
EOS (ABSOLUTE): 0.2 10*3/uL (ref 0.0–0.4)
EOS: 3 %
Hematocrit: 35.9 % — ABNORMAL LOW (ref 37.5–51.0)
Hemoglobin: 11.4 g/dL — ABNORMAL LOW (ref 13.0–17.7)
Immature Grans (Abs): 0 10*3/uL (ref 0.0–0.1)
Immature Granulocytes: 0 %
LYMPHS ABS: 2.2 10*3/uL (ref 0.7–3.1)
Lymphs: 28 %
MCH: 26.4 pg — AB (ref 26.6–33.0)
MCHC: 31.8 g/dL (ref 31.5–35.7)
MCV: 83 fL (ref 79–97)
MONOS ABS: 0.7 10*3/uL (ref 0.1–0.9)
Monocytes: 9 %
NEUTROS ABS: 4.7 10*3/uL (ref 1.4–7.0)
Neutrophils: 60 %
Platelets: 249 10*3/uL (ref 150–379)
RBC: 4.32 x10E6/uL (ref 4.14–5.80)
RDW: 16.5 % — AB (ref 12.3–15.4)
WBC: 7.8 10*3/uL (ref 3.4–10.8)

## 2016-05-16 LAB — HEMOGLOBIN A1C
Est. average glucose Bld gHb Est-mCnc: 103 mg/dL
Hgb A1c MFr Bld: 5.2 % (ref 4.8–5.6)

## 2016-05-16 LAB — LIPID PANEL
CHOL/HDL RATIO: 2.4 ratio (ref 0.0–5.0)
CHOLESTEROL TOTAL: 129 mg/dL (ref 100–199)
HDL: 54 mg/dL (ref >39)
LDL CALC: 62 mg/dL (ref 0–99)
Triglycerides: 66 mg/dL (ref 0–149)
VLDL Cholesterol Cal: 13 mg/dL (ref 5–40)

## 2016-05-16 LAB — HEPATIC FUNCTION PANEL (6)
ALK PHOS: 167 IU/L — AB (ref 39–117)
ALT: 19 IU/L (ref 0–44)
AST: 15 IU/L (ref 0–40)
BILIRUBIN TOTAL: 0.4 mg/dL (ref 0.0–1.2)
BILIRUBIN, DIRECT: 0.16 mg/dL (ref 0.00–0.40)

## 2016-05-16 NOTE — Progress Notes (Signed)
DAMAR, PETIT ([id number]) Visit Report for 05/15/2016 Chief Complaint Document Details Patient Name: [name], [name]. Date of Service: 05/15/2016 1:45 PM Medical Record Number: [id number] Patient Account Number: [id number] Date of Birth/Sex: [birth year] (66 y.o. Male) Treating RN: Clover Mealy, RN, BSN, [place] Sink Primary Care Provider: Elizabeth Sauer Other Clinician: Referring Provider: Elizabeth Sauer Treating Provider/Extender: Maxwell Caul Weeks in Treatment: 16 Information Obtained from: Patient Chief Complaint 01/24/16 patient arrives today for review of a pressure ulcer on his right scapula in the setting of incomplete C3-C4 quadriplegia Electronic Signature(s) Signed: 05/15/2016 4:28:03 PM By: Baltazar Najjar MD Entered By: Baltazar Najjar on 05/15/2016 14:27:29 Brummet, [name] ([id number]) -------------------------------------------------------------------------------- HPI Details Patient Name: [name] Bras C. Date of Service: 05/15/2016 1:45 PM Medical Record Number: [id number] Patient Account Number: [id number] Date of Birth/Sex: 19-Dec-1950 (66 y.o. 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 He has lower extremity sensation and can move his legs but has no/limited control over his arms. His wife accompanies him today and states that in the late spring or early summer of 2017 the patient became very depressed. He refused the refused to mobilize and he developed several pressure ulcers on his back. Most of these have healed however they have a recalcitrant area over the right scapula. They've been using Santyl on this for at least the last month.  In terms of depression the patient is doing better now on an antidepressant. He saw his primary physician on 01/12/16 at which time there was apparently green drainage coming out of this area [Dr. Deana Jones]. Dr. Yetta Barre works in the [facility]. He has completed this Septra. Otherwise [facility] notes that he has a history of seizures. He also had a stroke in 2008 he follows with neurology. He also has type 2 diabetes on Glucophage, hyperlipidemia and gastroesophageal reflux. He takes Plavix for stroke prevention and Keppra for seizure prophylaxis. 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 02/08/16; this is a patient with a pressure ulcer over the right scapula. His wife has been doing the dressing with Santyl and border foam change every second day. When he arrived here 2 weeks ago we did a fairly extensive mechanical debridement. We are asked medical modalities to go out to the home and see what they might be eligible for in terms of pressure-relief surfaces [level 2] but the wife states that they have not heard from them. 03/20/15; this is a patient we haven't seen in 5-6 weeks. This was largely due to transportation issues. They've been using Santyl and border foam changing every second day for a pressure injury over the right scapula. The patient has a C3-C4 spinal injury. We had also asked for a review by the people who supplied DME to look at his wheelchair cushion, mattress etc. I don't know that this ever happened. In the meantime the wound has done remarkably well current measurements 1.8 x 2 x 0.1 04/03/16; the patient's dimensions have gone up to 3 cm in diameter quite a deterioration from last time. He is also complaining of pain in this area which is new. 04/10/16; 3  x 1.5 x 0.1. No difference from last week. Culture I did last week was negative he has  completed antibiotics. 04/24/16 2.4 x 2 x 0.1; wound generally looks smaller. Middle area that I had to debrided last time looks healthier. His son is fashioned a large piece of foam cut out where the patient's wound with hit the back of his wheelchair 05/01/16; wound is a same size however the surface of this looks better. The middle innkeeper area required a repeat debridement 05/15/16; wound is down and dimensions granulation still looks healthy. The medial aspect of this wound is now the deeper Divot. We'll see how this responds to further healing. We're using [employer]) Signed: 05/15/2016 4:28:03 PM By: Baltazar Najjar MD [name], [name] ([id number]) Entered By: Baltazar Najjar on 05/15/2016 14:29:03 [place], [name] ([id number]) -------------------------------------------------------------------------------- Physical Exam Details Patient Name: [name], [name]. Date of Service: 05/15/2016 1:45 PM Medical Record Number: [id number] Patient Account Number: [id number] Date of Birth/Sex: 07-20-1950 (66 y.o. Male) Treating RN: Clover Mealy, RN, BSN, [place] Sink Primary Care Provider: Elizabeth Sauer Other Clinician: Referring Provider: Elizabeth Sauer Treating Provider/Extender: Maxwell Caul Weeks in Treatment: 16 Constitutional Patient is hypotensive.allert and responsive appears well. Pulse regular and within target range for patient.Marland Kitchen Respirations regular, non-labored and within target range.. Temperature is normal and within the target range for the patient.. Patient's appearance is neat and clean. Appears in no acute distress. Well nourished and well developed.. Notes Wound exam; pressure area over the right scapula in this man with a lower cervical cord injury. There is a area that was deeper in the middle of this wound now abuts on the medial edge of the wound. We have had good improvement in measurements of roughly 0. 5 cm length and width Electronic  Signature(s) Signed: 05/15/2016 4:28:03 PM By: Baltazar Najjar MD Entered By: Baltazar Najjar on 05/15/2016 14:31:17 Gervacio, [name] ([id number]) -------------------------------------------------------------------------------- Physician Orders Details Patient Name: [name], [name] Date of Service: 05/15/2016 1:45 PM Medical Record Number: [id number] Patient Account Number: [id number] Date of Birth/Sex: Oct 17, 1950 (66 y.o. Male) Treating RN: Clover Mealy, RN, BSN, [place] Sink Primary Care Provider: Elizabeth Sauer Other Clinician: Referring Provider: Elizabeth Sauer Treating Provider/Extender: Altamese [place] in Treatment: 16 Verbal / Phone Orders: No Diagnosis Coding Wound Cleansing Wound #1 Right,Proximal Back o Clean wound with Normal Saline. o Cleanse wound with mild soap and water Anesthetic Wound #1 Right,Proximal Back o Topical Lidocaine 4% cream applied to wound bed prior to debridement - CLINIC USE Skin Barriers/Peri-Wound Care Wound #1 Right,Proximal Back o Skin Prep Primary Wound Dressing Wound #1 Right,Proximal Back o Hydrafera Blue Secondary Dressing Wound #1 Right,Proximal Back o Dry Gauze o Boardered Foam Dressing Dressing Change Frequency Wound #1 Right,Proximal Back o Change dressing every other day. Follow-up Appointments Wound #1 Right,Proximal Back o Return Appointment in 1 week. Off-Loading Wound #1 Right,Proximal Back o Matt[employer] does not cover because they out of network. Patient wife to call the insurance company to find out with DME company is in network. o Turn and reposition every 2 hours [name], [name] ([id number]) o Other: - Pad for wheelchair for positioning FELT USE TODAY IN THE CLINIC Additional Orders / Instructions Wound #1 Right,Proximal Back o Increase protein intake. Medications-please add to medication list. Wound #1 Right,Proximal Back o Other: - VITAMIN C, ZINC, VIt A, MVI Electronic  Signature(s) Signed: 05/15/2016 4:28:03 PM By: Baltazar Najjar MD Signed: 05/15/2016 4:30:05 PM By: Elpidio Eric BSN, RN Entered By: Elpidio Eric  on 05/15/2016 14:00:16 [name], [name] ([id number]) -------------------------------------------------------------------------------- Problem List Details Patient Name: [name], BELSHE. Date of Service: 05/15/2016 1:45 PM Medical Record Number: [id number] Patient Account Number: [id number] Date of Birth/Sex: Feb 15, 1951 (66 y.o. Male) Treating RN: Clover Mealy, RN, BSN, [place] Sink Primary Care Provider: Elizabeth Sauer Other Clinician: Referring Provider: Elizabeth Sauer Treating Provider/Extender: Maxwell Caul Weeks in Treatment: 16 Active Problems ICD-10 Encounter Code Description Active Date Diagnosis 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, stage 3 01/24/2016 Yes S14.103S Unspecified injury at C3 level of cervical spinal cord, 01/24/2016 Yes sequela Inactive Problems Resolved Problems Electronic Signature(s) Signed: 05/15/2016 4:28:03 PM By: Baltazar Najjar MD Entered By: Baltazar Najjar on 05/15/2016 14:27:17 [place], [name] ([id number]) -------------------------------------------------------------------------------- Progress Note Details Patient Name: [name] Bras C. Date of Service: 05/15/2016 1:45 PM Medical Record Number: [id number] Patient Account Number: [id number] Date of Birth/Sex: 12-02-1950 (66 y.o. 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 He has lower extremity sensation and can move his legs but has no/limited control over his arms. His wife accompanies him today and states that in the late spring or early summer of 2017 the patient became very depressed. He refused the refused to mobilize and he developed several pressure ulcers on his back. Most of these have healed however they have a recalcitrant area over the right scapula. They've been using Santyl on this for at least the last month. In terms of depression the patient is doing better now on an antidepressant. He saw his primary physician on 01/12/16 at which time there was apparently green drainage coming out of this area [Dr. Deana Jones]. Dr. Yetta Barre works in the [employer]  [facility]. He has completed this Septra. Otherwise looking [facility] notes that he has a history of seizures. He also had a stroke in 2008 he follows with neurology. He also has type 2 diabetes on Glucophage, hyperlipidemia and gastroesophageal reflux. He takes Plavix for stroke prevention and Keppra for seizure prophylaxis. 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 02/08/16; this is a patient with a pressure ulcer over the right scapula. His wife has been doing the dressing with Santyl and border foam change every second day. When he arrived here 2 weeks ago we did a fairly extensive mechanical debridement. We are asked medical modalities to go out to the home and see what they might be eligible for in terms of pressure-relief surfaces [level 2] but the wife states that they have not heard from them. 03/20/15; this is a patient we haven't seen in 5-6 weeks. This was largely due to transportation issues. They've been using Santyl and border foam changing every second day for a pressure injury over the right scapula. The patient has a C3-C4 spinal injury. We had also asked for a review  by the people who supplied DME to look at his wheelchair cushion, mattress etc. I don't know that this ever happened. In the meantime the wound has done remarkably well current measurements  1.8 x 2 x 0.1 04/03/16; the patient's dimensions have gone up to 3 cm in diameter quite a deterioration from last time. He is also complaining of pain in this area which is new. 04/10/16; 3 x 1.5 x 0.1. No difference from last week. Culture I did last week was negative he has completed antibiotics. 04/24/16 2.4 x 2 x 0.1; wound generally looks smaller. Middle area that I had to debrided last time looks healthier. His son is fashioned a large piece of foam cut out where the patient's wound with hit the back of his wheelchair 05/01/16; wound is a same size however the surface of this looks better. The middle innkeeper area [facility] [name] BienRICHARD C. ([id number]) a repeat debridement 05/15/16; wound is down and dimensions granulation still looks healthy. The medial aspect of this wound is now the deeper Divot. We'll see how this responds to further healing. We're using Hydrofera Blue Objective Constitutional Patient is hypotensive.allert and responsive appears well. Pulse regular and within target range for patient.Marland Kitchen. Respirations regular, non-labored and within target range.. Temperature is normal and within the target range for the patient.. Patient's appearance is neat and clean. Appears in no acute distress. Well nourished and well developed.. Vitals Time Taken: 1:46 PM, Height: 70 in, Weight: 187 lbs, BMI: 26.8, Temperature: 97.8 F, Pulse: 84 bpm, Respiratory Rate: 16 breaths/min, Blood Pressure: 74/44 mmHg. General Notes: Wound exam; pressure area over the right scapula in this man with a lower cervical cord injury. There is a area that was deeper in the middle of this wound now abuts on the medial edge of the wound. We have had good improvement in measurements of roughly 0. 5 cm length and  width Integumentary (Hair, Skin) Wound #1 status is Open. Original cause of wound was Pressure Injury. The wound is located on the Right,Proximal Back. The wound measures 2cm length x 1.4cm width x 0.2cm depth; 2.199cm^2 area and 0.44cm^3 volume. There is Fat Layer (Subcutaneous Tissue) Exposed exposed. There is no tunneling or undermining noted. There is a medium amount of serous drainage noted. The wound margin is flat and intact. There is medium (34-66%) red, pink granulation within the wound bed. There is a medium (34-66%) amount of necrotic tissue within the wound bed including Adherent Slough. The periwound skin appearance exhibited: Excoriation, Erythema. The periwound skin appearance did not exhibit: Callus, Crepitus, Induration, Rash, Scarring, Dry/Scaly, Maceration, Atrophie Blanche, Cyanosis, Ecchymosis, Hemosiderin Staining, Mottled, Pallor, Rubor. The surrounding wound skin color is noted with erythema which is circumferential. Periwound temperature was noted as No Abnormality. The periwound has tenderness on palpation. 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, stage 3 S14.103S - Unspecified injury at C3 level of cervical spinal cord, sequela [name], [name] ([id number]) Plan Wound Cleansing: Wound #1 Right,Proximal Back: Clean wound with Normal Saline. Cleanse wound with mild soap and water Anesthetic: Wound #1 Right,Proximal Back: Topical Lidocaine 4% cream applied to wound bed prior to debridement - CLINIC USE Skin Barriers/Peri-Wound Care: Wound #1 Right,Proximal Back: Skin Prep Primary Wound Dressing: Wound #1 Right,Proximal Back: Hydrafera Blue Secondary Dressing: Wound #1 Right,Proximal Back: Dry Gauze Boardered Foam Dressing Dressing Change Frequency: Wound #1 Right,Proximal Back: Change dressing every other day. Follow-up Appointments: Wound #1 Right,Proximal Back: Return Appointment in 1 week. Off-Loading: Wound #1  Right,Proximal Back: [employer] does not cover because they out of network. Patient wife to call the insurance company to find out with DME company is in network. Turn and  reposition every 2 hours Other: - Pad for wheelchair for positioning FELT USE TODAY IN THE CLINIC Additional Orders / Instructions: Wound #1 Right,Proximal Back: Increase protein intake. Medications-please add to medication list.: Wound #1 Right,Proximal Back: Other: - VITAMIN C, ZINC, VIt A, MVI [name], [name] ([id number]) #1 continue Hydrofera Blue #2 for next week we need to see how the medial leg to this wound responds #3 there is no evidence of surrounding infection Electronic Signature(s) Signed: 05/15/2016 4:28:03 PM By: Baltazar Najjar MD Entered By: Baltazar Najjar on 05/15/2016 14:31:54 [name], [name] ([id number]) -------------------------------------------------------------------------------- SuperBill Details Patient Name: [name] Bras C. Date of Service: 05/15/2016 Medical Record Number: [id number] Patient Account Number: [id number] Date of Birth/Sex: Mar 16, 1950 (66 y.o. Male) Treating RN: Clover Mealy, RN, BSN, [place] Sink Primary Care Provider: Elizabeth Sauer Other Clinician: Referring Provider: Elizabeth Sauer Treating Provider/Extender: Maxwell Caul Service Line: Outpatient Weeks in Treatment: 16 Diagnosis Coding ICD-10 Codes Code Description [phone] Pressure ulcer of other site, stage 3 S14.103S Unspecified injury at C3 level of cervical spinal cord, sequela Facility Procedures CPT4 Code: [id number] Description: [phone] - WOUND CARE VISIT-LEV 2 EST PT Modifier: Quantity: 1 Physician Procedures CPT4 Code: [id number] Description: 25366 - WC PHYS LEVEL 2 - EST PT ICD-10 Description Diagnosis 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, stage 3 Modifier: Quantity: 1 Electronic Signature(s) Signed: 05/15/2016 4:28:03 PM By: Baltazar Najjar MD Entered By: Baltazar Najjar on 05/15/2016  14:32:06

## 2016-05-16 NOTE — Progress Notes (Signed)
ADITHYA, DIFRANCESCO ([id number]) Visit Report for 05/15/2016 Arrival Information Details Patient Name: [name], [name]. Date of Service: 05/15/2016 1:45 PM Medical Record Number: [id number] Patient Account Number: [id number] Date of Birth/Sex: 06-02-1950 (66 y.o. Male) Treating RN: Baruch Gouty, RN, BSN, Velva Harman Primary Care Breland Elders: Otilio Miu Other Clinician: Referring Niobe Dick: Otilio Miu Treating Cinde Ebert/Extender: Tito Dine in Treatment: 86 Visit Information History Since Last Visit All ordered tests and consults were completed: No Patient Arrived: Wheel Chair Added or deleted any medications: No Arrival Time: 13:43 Any new allergies or adverse reactions: No Accompanied By: wife Had a fall or experienced change in No activities of daily living that may affect Transfer Assistance: None risk of falls: Patient Identification Verified: Yes Signs or symptoms of abuse/neglect since last No Secondary Verification Process Yes visito Completed: Hospitalized since last visit: No Patient Requires Transmission-Based No Has Dressing in Place as Prescribed: Yes Precautions: Pain Present Now: No Patient Has Alerts: Yes Patient Alerts: DM II Plavix Electronic Signature(s) Signed: 05/15/2016 4:30:05 PM By: Regan Lemming BSN, RN Entered By: Regan Lemming on 05/15/2016 13:44:28 Acebo, [name] ([id number]) -------------------------------------------------------------------------------- Clinic Level of Care Assessment Details Patient Name: Wieand, [name] Date of Service: 05/15/2016 1:45 PM Medical Record Number: [id number] Patient Account Number: [id number] Date of Birth/Sex: Jan 05, 1951 (66 y.o. Male) Treating RN: Baruch Gouty, RN, BSN, [place] Primary Care Tavaria Mackins: Otilio Miu Other Clinician: Referring Gemma Ruan: Otilio Miu Treating Orlena Garmon/Extender: Tito Dine in Treatment: 16 Clinic Level of Care Assessment Items TOOL 4 Quantity Score '[]'$  - Use when only an EandM  is performed on FOLLOW-UP visit 0 ASSESSMENTS - Nursing Assessment / Reassessment X - Reassessment of Co-morbidities (includes updates in patient status) 1 10 X - Reassessment of Adherence to Treatment Plan 1 5 ASSESSMENTS - Wound and Skin Assessment / Reassessment X - Simple Wound Assessment / Reassessment - one wound 1 5 '[]'$  - Complex Wound Assessment / Reassessment - multiple wounds 0 '[]'$  - Dermatologic / Skin Assessment (not related to wound area) 0 ASSESSMENTS - Focused Assessment '[]'$  - Circumferential Edema Measurements - multi extremities 0 '[]'$  - Nutritional Assessment / Counseling / Intervention 0 '[]'$  - Lower Extremity Assessment (monofilament, tuning fork, pulses) 0 '[]'$  - Peripheral Arterial Disease Assessment (using hand held doppler) 0 ASSESSMENTS - Ostomy and/or Continence Assessment and Care '[]'$  - Incontinence Assessment and Management 0 '[]'$  - Ostomy Care Assessment and Management (repouching, etc.) 0 PROCESS - Coordination of Care X - Simple Patient / Family Education for ongoing care 1 15 '[]'$  - Complex (extensive) Patient / Family Education for ongoing care 0 '[]'$  - Staff obtains Programmer, systems, Records, Test Results / Process Orders 0 '[]'$  - Staff telephones HHA, Nursing Homes / Clarify orders / etc 0 '[]'$  - Routine Transfer to another Facility (non-emergent condition) 0 [name], [name]. ([id number]) '[]'$  - Routine Hospital Admission (non-emergent condition) 0 '[]'$  - New Admissions / Biomedical engineer / Ordering NPWT, Apligraf, etc. 0 '[]'$  - Emergency Hospital Admission (emergent condition) 0 '[]'$  - Simple Discharge Coordination 0 '[]'$  - Complex (extensive) Discharge Coordination 0 PROCESS - Special Needs '[]'$  - Pediatric / Minor Patient Management 0 '[]'$  - Isolation Patient Management 0 '[]'$  - Hearing / Language / Visual special needs 0 '[]'$  - Assessment of Community assistance (transportation, D/C planning, etc.) 0 '[]'$  - Additional assistance / Altered mentation 0 '[]'$  - Support Surface(s)  Assessment (bed, cushion, seat, etc.) 0 INTERVENTIONS - Wound Cleansing / Measurement X - Simple Wound Cleansing - one wound 1 5 '[]'$  - Complex  Wound Cleansing - multiple wounds 0 X - Wound Imaging (photographs - any number of wounds) 1 5 '[]'$  - Wound Tracing (instead of photographs) 0 X - Simple Wound Measurement - one wound 1 5 '[]'$  - Complex Wound Measurement - multiple wounds 0 INTERVENTIONS - Wound Dressings X - Small Wound Dressing one or multiple wounds 1 10 '[]'$  - Medium Wound Dressing one or multiple wounds 0 '[]'$  - Large Wound Dressing one or multiple wounds 0 '[]'$  - Application of Medications - topical 0 '[]'$  - Application of Medications - injection 0 INTERVENTIONS - Miscellaneous '[]'$  - External ear exam 0 Hakala, [name] ([id number]) '[]'$  - Specimen Collection (cultures, biopsies, blood, body fluids, etc.) 0 '[]'$  - Specimen(s) / Culture(s) sent or taken to Lab for analysis 0 '[]'$  - Patient Transfer (multiple staff / Harrel Lemon Lift / Similar devices) 0 '[]'$  - Simple Staple / Suture removal (25 or less) 0 '[]'$  - Complex Staple / Suture removal (26 or more) 0 '[]'$  - Hypo / Hyperglycemic Management (close monitor of Blood Glucose) 0 '[]'$  - Ankle / Brachial Index (ABI) - do not check if billed separately 0 X - Vital Signs 1 5 Has the patient been seen at the hospital within the last three years: Yes Total Score: 65 Level Of Care: New/Established - Level 2 Electronic Signature(s) Signed: 05/15/2016 4:30:05 PM By: Regan Lemming BSN, RN Entered By: Regan Lemming on 05/15/2016 14:04:36 Neiswonger, [name] ([id number]) -------------------------------------------------------------------------------- Encounter Discharge Information Details Patient Name: [name] Coast C. Date of Service: 05/15/2016 1:45 PM Medical Record Number: [id number] Patient Account Number: [id number] Date of Birth/Sex: [birth year] (66 y.o. Male) Treating RN: Baruch Gouty, RN, BSN, Velva Harman Primary Care Keifer Habib: Otilio Miu Other Clinician: Referring  Curt Oatis: Otilio Miu Treating Pascual Mantel/Extender: Tito Dine in Treatment: 16 Encounter Discharge Information Items Discharge Pain Level: 0 Discharge Condition: Stable Ambulatory Status: Wheelchair Discharge Destination: Home Transportation: Private Auto Accompanied By: wife Schedule Follow-up Appointment: No Medication Reconciliation completed and provided to Patient/Care No Hillard Goodwine: Provided on Clinical Summary of Care: 05/15/2016 Form Type Recipient Paper Patient [facility] Electronic Signature(s) Signed: 05/15/2016 2:06:02 PM By: Ruthine Dose Entered By: Ruthine Dose on 05/15/2016 14:06:02 [place], [name] ([id number]) -------------------------------------------------------------------------------- Lower Extremity Assessment Details Patient Name: Strandberg, Rondale C. Date of Service: 05/15/2016 1:45 PM Medical Record Number: [id number] Patient Account Number: [id number] Date of Birth/Sex: 11-07-1950 (66 y.o. Male) Treating RN: Baruch Gouty, RN, BSN, Velva Harman Primary Care Clearance Chenault: Otilio Miu Other Clinician: Referring Sten Dematteo: Otilio Miu Treating Windell Musson/Extender: Ricard Dillon Weeks in Treatment: 16 Electronic Signature(s) Signed: 05/15/2016 4:30:05 PM By: Regan Lemming BSN, RN Entered By: Regan Lemming on 05/15/2016 13:50:45 Sloss, [name] ([id number]) -------------------------------------------------------------------------------- Multi Wound Chart Details Patient Name: Pryce, Gerrett C. Date of Service: 05/15/2016 1:45 PM Medical Record Number: [id number] Patient Account Number: [id number] Date of Birth/Sex: 09-Nov-1950 (66 y.o. Male) Treating RN: Baruch Gouty, RN, BSN, Velva Harman Primary Care Marshaun Lortie: Otilio Miu Other Clinician: Referring Eisha Chatterjee: Otilio Miu Treating Thadius Smisek/Extender: Ricard Dillon Weeks in Treatment: 16 Vital Signs Height(in): 70 Pulse(bpm): 84 Weight(lbs): 187 Blood Pressure 74/44 (mmHg): Body Mass Index(BMI): 27 Temperature(F):  97.8 Respiratory Rate 16 (breaths/min): Photos: [1:No Photos] [N/A:N/A] Wound Location: [1:Right Back - Proximal] [N/A:N/A] Wounding Event: [1:Pressure Injury] [N/A:N/A] Primary Etiology: [1:Pressure Ulcer] [N/A:N/A] Comorbid History: [1:Coronary Artery Disease, Type II Diabetes, Quadriplegia, Seizure Disorder] [N/A:N/A] Date Acquired: [1:11/24/2015] [N/A:N/A] Weeks of Treatment: [1:16] [N/A:N/A] Wound Status: [1:Open] [N/A:N/A] Measurements L x W x D 2x1.4x0.2 [N/A:N/A] (cm) Area (cm) : [1:2.199] [N/A:N/A] Volume (cm) : [1:0.44] [N/A:N/A] % Reduction  in Area: [1:72.70%] [N/A:N/A] % Reduction in Volume: 45.30% [N/A:N/A] Classification: [1:Category/Stage II] [N/A:N/A] Exudate Amount: [1:Medium] [N/A:N/A] Exudate Type: [1:Serous] [N/A:N/A] Exudate Color: [1:amber] [N/A:N/A] Wound Margin: [1:Flat and Intact] [N/A:N/A] Granulation Amount: [1:Medium (34-66%)] [N/A:N/A] Granulation Quality: [1:Red, Pink] [N/A:N/A] Necrotic Amount: [1:Medium (34-66%)] [N/A:N/A] Exposed Structures: [1:Fat Layer (Subcutaneous Tissue) Exposed: Yes Fascia: No Tendon: No Muscle: No] [N/A:N/A] Joint: No Bone: No Epithelialization: Small (1-33%) N/A N/A Periwound Skin Texture: Excoriation: Yes N/A N/A Induration: No Callus: No Crepitus: No Rash: No Scarring: No Periwound Skin Maceration: No N/A N/A Moisture: Dry/Scaly: No Periwound Skin Color: Erythema: Yes N/A N/A Atrophie Blanche: No Cyanosis: No Ecchymosis: No Hemosiderin Staining: No Mottled: No Pallor: No Rubor: No Erythema Location: Circumferential N/A N/A Temperature: No Abnormality N/A N/A Tenderness on Yes N/A N/A Palpation: Wound Preparation: Ulcer Cleansing: N/A N/A Rinsed/Irrigated with Saline Topical Anesthetic Applied: Other: lidocaine 4% Treatment Notes Wound #1 (Right, Proximal Back) 1. Cleansed with: Clean wound with Normal Saline 4. Dressing Applied: Hydrafera Blue 5. Secondary Dressing Applied Bordered Foam  Dressing 6. Footwear/Offloading device applied Felt/Foam Electronic Signature(s) Signed: 05/15/2016 4:28:03 PM By: Baltazar Najjar MD Entered By: Baltazar Najjar on 05/15/2016 14:27:23 Laroque, [name] ([id number]) -------------------------------------------------------------------------------- Multi-Disciplinary Care Plan Details Patient Name: [name], BAUMGARDNER. Date of Service: 05/15/2016 1:45 PM Medical Record Number: [id number] Patient Account Number: [id number] Date of Birth/Sex: 1950/11/09 (66 y.o. Male) Treating RN: Clover Mealy, RN, BSN, Richardson Sink Primary Care Edwardine Deschepper: Elizabeth Sauer Other Clinician: Referring Cythia Bachtel: Elizabeth Sauer Treating Amal Renbarger/Extender: Maxwell Caul Weeks in Treatment: 16 Active Inactive ` Nutrition Nursing Diagnoses: Imbalanced nutrition Impaired glucose control: actual or potential Goals: Patient/caregiver agrees to and verbalizes understanding of need to use nutritional supplements and/or vitamins as prescribed Date Initiated: 01/24/2016 Target Resolution Date: 03/27/2016 Goal Status: Active Patient/caregiver will maintain therapeutic glucose control Date Initiated: 01/24/2016 Target Resolution Date: 03/27/2016 Goal Status: Active Interventions: Assess patient nutrition upon admission and as needed per policy Provide education on elevated blood sugars and impact on wound healing Notes: ` Orientation to the Wound Care Program Nursing Diagnoses: Knowledge deficit related to the wound healing center program Goals: Patient/caregiver will verbalize understanding of the Wound Healing Center Program Date Initiated: 01/24/2016 Target Resolution Date: 03/27/2016 Goal Status: Active Interventions: Provide education on orientation to the wound center Notes: BAILEE, THALL ([id number]) ` Pain, Acute or Chronic Nursing Diagnoses: Pain, acute or chronic: actual or potential Potential alteration in comfort, pain Goals: Patient will verbalize adequate  pain control and receive pain control interventions during procedures as needed Date Initiated: 01/24/2016 Target Resolution Date: 03/27/2016 Goal Status: Active Patient/caregiver will verbalize adequate pain control between visits Date Initiated: 01/24/2016 Target Resolution Date: 03/27/2016 Goal Status: Active Patient/caregiver will verbalize comfort level met Date Initiated: 01/24/2016 Target Resolution Date: 03/27/2016 Goal Status: Active Interventions: Assess comfort goal upon admission Complete pain assessment as per visit requirements Notes: ` Pressure Nursing Diagnoses: Knowledge deficit related to management of pressures ulcers Potential for impaired tissue integrity related to pressure, friction, moisture, and shear Goals: Patient will remain free from development of additional pressure ulcers Date Initiated: 01/24/2016 Target Resolution Date: 03/27/2016 Goal Status: Active Interventions: Assess: immobility, friction, shearing, incontinence upon admission and as needed Assess offloading mechanisms upon admission and as needed Assess potential for pressure ulcer upon admission and as needed Provide education on pressure ulcers Notes: [name], [name] ([id number]) Wound/Skin Impairment Nursing Diagnoses: Impaired tissue integrity Goals: Ulcer/skin breakdown will have a volume reduction of 30% by week 4 Date Initiated: 01/24/2016 Target Resolution Date:  03/27/2016 Goal Status: Active Ulcer/skin breakdown will have a volume reduction of 50% by week 8 Date Initiated: 01/24/2016 Target Resolution Date: 03/27/2016 Goal Status: Active Ulcer/skin breakdown will have a volume reduction of 80% by week 12 Date Initiated: 01/24/2016 Target Resolution Date: 03/27/2016 Goal Status: Active Interventions: Assess patient/caregiver ability to perform ulcer/skin care regimen upon admission and as needed Assess ulceration(s) every visit Notes: Electronic Signature(s) Signed:  05/15/2016 4:30:05 PM By: Regan Lemming BSN, RN Entered By: Regan Lemming on 05/15/2016 13:50:59 Croker, [name] ([id number]) -------------------------------------------------------------------------------- Pain Assessment Details Patient Name: [name] Coast C. Date of Service: 05/15/2016 1:45 PM Medical Record Number: [id number] Patient Account Number: [id number] Date of Birth/Sex: 1950-10-20 (66 y.o. Male) Treating RN: Baruch Gouty, RN, BSN, Velva Harman Primary Care Kaysan Peixoto: Otilio Miu Other Clinician: Referring Michell Giuliano: Otilio Miu Treating Amos Gaber/Extender: Ricard Dillon Weeks in Treatment: 16 Active Problems Location of Pain Severity and Description of Pain Patient Has Paino No Site Locations With Dressing Change: No Pain Management and Medication Current Pain Management: Electronic Signature(s) Signed: 05/15/2016 4:30:05 PM By: Regan Lemming BSN, RN Entered By: Regan Lemming on 05/15/2016 13:45:50 [name], [name] ([id number]) -------------------------------------------------------------------------------- Patient/Caregiver Education Details Patient Name: [name] Ship. Date of Service: 05/15/2016 1:45 PM Medical Record Number: [id number] Patient Account Number: [id number] Date of Birth/Gender: [birth year] (66 y.o. Male) Treating RN: Baruch Gouty, RN, BSN, Velva Harman Primary Care Physician: Otilio Miu Other Clinician: Referring Physician: Otilio Miu Treating Physician/Extender: Tito Dine in Treatment: 16 Education Assessment Education Provided To: Patient Education Topics Provided Elevated Blood Sugar/ Impact on Healing: Methods: Explain/Verbal Responses: State content correctly Pressure: Methods: Explain/Verbal Responses: State content correctly Welcome To [facility]: Methods: Explain/Verbal Responses: State content correctly Wound/Skin Impairment: Methods: Explain/Verbal Responses: State content correctly Electronic Signature(s) Signed: 05/15/2016  4:30:05 PM By: Regan Lemming BSN, RN Entered By: Regan Lemming on 05/15/2016 14:05:48 Furnari, [name] ([id number]) -------------------------------------------------------------------------------- Wound Assessment Details Patient Name: [name], [name] Date of Service: 05/15/2016 1:45 PM Medical Record Number: [id number] Patient Account Number: [id number] Date of Birth/Sex: 09/15/1950 (66 y.o. Male) Treating RN: Baruch Gouty, RN, BSN, [place] Primary Care Gill Delrossi: Otilio Miu Other Clinician: Referring Libby Goehring: Otilio Miu Treating Pegah Segel/Extender: Ricard Dillon Weeks in Treatment: 16 Wound Status Wound Number: 1 Primary Pressure Ulcer Etiology: Wound Location: Right Back - Proximal Wound Open Wounding Event: Pressure Injury Status: Date Acquired: 11/24/2015 Comorbid Coronary Artery Disease, Type II Weeks Of Treatment: 16 History: Diabetes, Quadriplegia, Seizure Clustered Wound: No Disorder Photos Photo Uploaded By: Regan Lemming on 05/15/2016 16:01:53 Wound Measurements Length: (cm) 2 Width: (cm) 1.4 Depth: (cm) 0.2 Area: (cm) 2.199 Volume: (cm) 0.44 % Reduction in Area: 72.7% % Reduction in Volume: 45.3% Epithelialization: Small (1-33%) Tunneling: No Undermining: No Wound Description Classification: Category/Stage II Wound Margin: Flat and Intact Exudate Amount: Medium Exudate Type: Serous Exudate Color: amber Foul Odor After Cleansing: No Slough/Fibrino Yes Wound Bed Granulation Amount: Medium (34-66%) Exposed Structure Granulation Quality: Red, Pink Fascia Exposed: No Necrotic Amount: Medium (34-66%) Fat Layer (Subcutaneous Tissue) Exposed: Yes Necrotic Quality: Adherent Slough Tendon Exposed: No Copeman, [name] ([id number]) Muscle Exposed: No Joint Exposed: No Bone Exposed: No Periwound Skin Texture Texture Color No Abnormalities Noted: No No Abnormalities Noted: No Callus: No Atrophie Blanche: No Crepitus: No Cyanosis: No Excoriation:  Yes Ecchymosis: No Induration: No Erythema: Yes Rash: No Erythema Location: Circumferential Scarring: No Hemosiderin Staining: No Mottled: No Moisture Pallor: No No Abnormalities Noted: No Rubor: No Dry / Scaly: No Maceration: No Temperature / Pain Temperature: No Abnormality Tenderness  on Palpation: Yes Wound Preparation Ulcer Cleansing: Rinsed/Irrigated with Saline Topical Anesthetic Applied: Other: lidocaine 4%, Treatment Notes Wound #1 (Right, Proximal Back) 1. Cleansed with: Clean wound with Normal Saline 4. Dressing Applied: Hydrafera Blue 5. Secondary Dressing Applied Bordered Foam Dressing 6. Footwear/Offloading device applied Felt/Foam Electronic Signature(s) Signed: 05/15/2016 4:30:05 PM By: Regan Lemming BSN, RN Entered By: Regan Lemming on 05/15/2016 13:50:10 Deshotel, [name] ([id number]) -------------------------------------------------------------------------------- Vitals Details Patient Name: [name] Coast C. Date of Service: 05/15/2016 1:45 PM Medical Record Number: [id number] Patient Account Number: [id number] Date of Birth/Sex: 1950/04/25 (66 y.o. Male) Treating RN: Baruch Gouty, RN, BSN, [place] Primary Care Izsak Meir: Otilio Miu Other Clinician: Referring Daden Mahany: Otilio Miu Treating Bartosz Luginbill/Extender: Ricard Dillon Weeks in Treatment: 16 Vital Signs Time Taken: 13:46 Temperature (F): 97.8 Height (in): 70 Pulse (bpm): 84 Weight (lbs): 187 Respiratory Rate (breaths/min): 16 Body Mass Index (BMI): 26.8 Blood Pressure (mmHg): 74/44 Reference Range: 80 - 120 mg / dl Electronic Signature(s) Signed: 05/15/2016 4:30:05 PM By: Regan Lemming BSN, RN Entered By: Regan Lemming on 05/15/2016 13:46:21

## 2016-05-19 LAB — FERRITIN: Ferritin: 60 ng/mL (ref 30–400)

## 2016-05-19 LAB — SPECIMEN STATUS REPORT

## 2016-05-23 ENCOUNTER — Other Ambulatory Visit: Payer: Self-pay | Admitting: Family Medicine

## 2016-05-27 ENCOUNTER — Other Ambulatory Visit: Payer: Self-pay | Admitting: Family Medicine

## 2016-05-27 DIAGNOSIS — R739 Hyperglycemia, unspecified: Secondary | ICD-10-CM

## 2016-05-29 ENCOUNTER — Ambulatory Visit: Payer: Medicare Other | Admitting: Nurse Practitioner

## 2016-06-05 ENCOUNTER — Encounter: Payer: Medicare Other | Attending: Internal Medicine | Admitting: Internal Medicine

## 2016-06-05 DIAGNOSIS — Z7984 Long term (current) use of oral hypoglycemic drugs: Secondary | ICD-10-CM | POA: Insufficient documentation

## 2016-06-05 DIAGNOSIS — L89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, stage 3: Secondary | ICD-10-CM | POA: Insufficient documentation

## 2016-06-05 DIAGNOSIS — R569 Unspecified convulsions: Secondary | ICD-10-CM | POA: Diagnosis not present

## 2016-06-05 DIAGNOSIS — S14103S Unspecified injury at C3 level of cervical spinal cord, sequela: Secondary | ICD-10-CM | POA: Diagnosis not present

## 2016-06-05 DIAGNOSIS — E1165 Type 2 diabetes mellitus with hyperglycemia: Secondary | ICD-10-CM | POA: Insufficient documentation

## 2016-06-05 DIAGNOSIS — K219 Gastro-esophageal reflux disease without esophagitis: Secondary | ICD-10-CM | POA: Diagnosis not present

## 2016-06-05 DIAGNOSIS — I959 Hypotension, unspecified: Secondary | ICD-10-CM | POA: Diagnosis not present

## 2016-06-05 DIAGNOSIS — L89113 Pressure ulcer of right upper back, stage 3: Secondary | ICD-10-CM | POA: Diagnosis not present

## 2016-06-05 DIAGNOSIS — Z8673 Personal history of transient ischemic attack (TIA), and cerebral infarction without residual deficits: Secondary | ICD-10-CM | POA: Insufficient documentation

## 2016-06-05 DIAGNOSIS — E785 Hyperlipidemia, unspecified: Secondary | ICD-10-CM | POA: Insufficient documentation

## 2016-06-05 DIAGNOSIS — F329 Major depressive disorder, single episode, unspecified: Secondary | ICD-10-CM | POA: Insufficient documentation

## 2016-06-05 DIAGNOSIS — G8252 Quadriplegia, C1-C4 incomplete: Secondary | ICD-10-CM | POA: Diagnosis not present

## 2016-06-05 DIAGNOSIS — X58XXXS Exposure to other specified factors, sequela: Secondary | ICD-10-CM | POA: Insufficient documentation

## 2016-06-07 NOTE — Progress Notes (Signed)
JAIMES, ECKERT ([id number]) Visit Report for 06/05/2016 Arrival Information Details Patient Name: [name], [name]. Date of Service: 06/05/2016 2:00 PM Medical Record Number: [id number] Patient Account Number: [id number] Date of Birth/Sex: [birth year] (66 y.o. Male) Treating RN: Baruch Gouty, RN, BSN, Velva Harman Primary Care Naksh Radi: Otilio Miu Other Clinician: Referring Mechelle Pates: Otilio Miu Treating Yamil Dougher/Extender: Tito Dine in Treatment: 42 Visit Information History Since Last Visit All ordered tests and consults were completed: No Patient Arrived: Wheel Chair Added or deleted any medications: No Arrival Time: 13:53 Any new allergies or adverse reactions: No Accompanied By: wife Had a fall or experienced change in No activities of daily living that may affect Transfer Assistance: None risk of falls: Patient Identification Verified: Yes Hospitalized since last visit: No Secondary Verification Process Yes Has Dressing in Place as Prescribed: Yes Completed: Pain Present Now: No Patient Requires Transmission-Based No Precautions: Patient Has Alerts: Yes Patient Alerts: DM II Plavix Electronic Signature(s) Signed: 06/05/2016 5:31:29 PM By: Regan Lemming BSN, RN Entered By: Regan Lemming on 06/05/2016 Stuart, [name] ([id number]) -------------------------------------------------------------------------------- Encounter Discharge Information Details Patient Name: [name] Coast C. Date of Service: 06/05/2016 2:00 PM Medical Record Number: [id number] Patient Account Number: [id number] Date of Birth/Sex: 30-Nov-1950 (66 y.o. Male) Treating RN: Baruch Gouty, RN, BSN, Velva Harman Primary Care Hulet Ehrmann: Otilio Miu Other Clinician: Referring Leor Whyte: Otilio Miu Treating Wilkie Zenon/Extender: Tito Dine in Treatment: 34 Encounter Discharge Information Items Discharge Pain Level: 0 Discharge Condition: Stable Ambulatory Status: Wheelchair Discharge Destination:  Home Transportation: Private Auto Accompanied By: wife Schedule Follow-up Appointment: No Medication Reconciliation completed and provided to Patient/Care No Emorie Mcfate: Provided on Clinical Summary of Care: 06/05/2016 Form Type Recipient Paper Patient [facility] Electronic Signature(s) Signed: 06/05/2016 5:31:29 PM By: Regan Lemming BSN, RN Previous Signature: 06/05/2016 2:56:30 PM Version By: Ruthine Dose Entered By: Regan Lemming on 06/05/2016 14:57:18 [name], [name] ([id number]) -------------------------------------------------------------------------------- Lower Extremity Assessment Details Patient Name: [name], [name] Date of Service: 06/05/2016 2:00 PM Medical Record Number: [id number] Patient Account Number: [id number] Date of Birth/Sex: 07-31-1950 (66 y.o. Male) Treating RN: Baruch Gouty, RN, BSN, Velva Harman Primary Care Siyana Erney: Otilio Miu Other Clinician: Referring Claudetta Sallie: Otilio Miu Treating Brentton Wardlow/Extender: Ricard Dillon Weeks in Treatment: 19 Electronic Signature(s) Signed: 06/05/2016 5:31:29 PM By: Regan Lemming BSN, RN Entered By: Regan Lemming on 06/05/2016 13:55:22 [name], [name] ([id number]) -------------------------------------------------------------------------------- Multi Wound Chart Details Patient Name: [name], [name] Date of Service: 06/05/2016 2:00 PM Medical Record Number: [id number] Patient Account Number: [id number] Date of Birth/Sex: [birth year] (66 y.o. Male) Treating RN: Baruch Gouty, RN, BSN, Velva Harman Primary Care Dajai Wahlert: Otilio Miu Other Clinician: Referring Najae Rathert: Otilio Miu Treating [place] Gan/Extender: Ricard Dillon Weeks in Treatment: 19 Vital Signs Height(in): 70 Pulse(bpm): 72 Weight(lbs): 187 Blood Pressure 88/45 (mmHg): Body Mass Index(BMI): 27 Temperature(F): Respiratory Rate 16 (breaths/min): Photos: [1:No Photos] [N/A:N/A] Wound Location: [1:Right Back - Proximal] [N/A:N/A] Wounding Event: [1:Pressure Injury]  [N/A:N/A] Primary Etiology: [1:Pressure Ulcer] [N/A:N/A] Comorbid History: [1:Coronary Artery Disease, Type II Diabetes, Quadriplegia, Seizure Disorder] [N/A:N/A] Date Acquired: [1:11/24/2015] [N/A:N/A] Weeks of Treatment: [1:19] [N/A:N/A] Wound Status: [1:Open] [N/A:N/A] Measurements L x W x D 1.7x1.1x0.1 [N/A:N/A] (cm) Area (cm) : [1:1.469] [N/A:N/A] Volume (cm) : [1:0.147] [N/A:N/A] % Reduction in Area: [1:81.80%] [N/A:N/A] % Reduction in Volume: 81.70% [N/A:N/A] Classification: [1:Category/Stage II] [N/A:N/A] Exudate Amount: [1:Medium] [N/A:N/A] Exudate Type: [1:Serous] [N/A:N/A] Exudate Color: [1:amber] [N/A:N/A] Wound Margin: [1:Flat and Intact] [N/A:N/A] Granulation Amount: [1:Medium (34-66%)] [N/A:N/A] Granulation Quality: [1:Red, Pink] [N/A:N/A] Necrotic Amount: [1:Medium (34-66%)] [N/A:N/A] Exposed Structures: [1:Fat Layer (Subcutaneous Tissue) Exposed: Yes  Fascia: No Tendon: No Muscle: No] [N/A:N/A] Joint: No Bone: No Epithelialization: Small (1-33%) N/A N/A Debridement: Debridement ([id number]- N/A N/A 11047) Pre-procedure 14:45 N/A N/A Verification/Time Out Taken: Pain Control: Lidocaine 4% Topical N/A N/A Solution Tissue Debrided: Fibrin/Slough, N/A N/A Subcutaneous Level: Skin/Subcutaneous N/A N/A Tissue Debridement Area (sq 1.87 N/A N/A cm): Instrument: Curette N/A N/A Bleeding: Minimum N/A N/A Hemostasis Achieved: Pressure N/A N/A Procedural Pain: 0 N/A N/A Post Procedural Pain: 0 N/A N/A Debridement Treatment Procedure was tolerated N/A N/A Response: well Post Debridement 1.7x1.1x0.2 N/A N/A Measurements L x W x D (cm) Post Debridement 0.294 N/A N/A Volume: (cm) Post Debridement Category/Stage III N/A N/A Stage: Periwound Skin Texture: Excoriation: Yes N/A N/A Induration: No Callus: No Crepitus: No Rash: No Scarring: No Periwound Skin Maceration: No N/A N/A Moisture: Dry/Scaly: No Periwound Skin Color: Erythema: Yes N/A N/A Atrophie  Blanche: No Cyanosis: No Ecchymosis: No Hemosiderin Staining: No Mottled: No Pallor: No Rubor: No Erythema Location: Circumferential N/A N/A Temperature: No Abnormality N/A N/A Tenderness on Yes N/A N/A Palpation: [name], [name] ([id number]) Wound Preparation: Ulcer Cleansing: N/A N/A Rinsed/Irrigated with Saline Topical Anesthetic Applied: Other: lidocaine 4% Procedures Performed: Debridement N/A N/A Treatment Notes Wound #1 (Right, Proximal Back) 1. Cleansed with: Clean wound with Normal Saline 4. Dressing Applied: Hydrafera Blue 5. Secondary Dressing Applied Bordered Foam Dressing Dry Gauze Electronic Signature(s) Signed: 06/05/2016 5:28:32 PM By: Baltazar Najjar MD Entered By: Baltazar Najjar on 06/05/2016 15:56:25 [name], [name] ([id number]) -------------------------------------------------------------------------------- Multi-Disciplinary Care Plan Details Patient Name: [name], CAREW. Date of Service: 06/05/2016 2:00 PM Medical Record Number: [id number] Patient Account Number: [id number] Date of Birth/Sex: [birth year] (66 y.o. Male) Treating RN: Clover Mealy, RN, BSN, [place] Primary Care Danaya Geddis: Elizabeth Sauer Other Clinician: Referring Kayden Hutmacher: Elizabeth Sauer Treating Ival Basquez/Extender: Maxwell Caul Weeks in Treatment: 6 Active Inactive ` Nutrition Nursing Diagnoses: Imbalanced nutrition Impaired glucose control: actual or potential Goals: Patient/caregiver agrees to and verbalizes understanding of need to use nutritional supplements and/or vitamins as prescribed Date Initiated: 01/24/2016 Target Resolution Date: 03/27/2016 Goal Status: Active Patient/caregiver will maintain therapeutic glucose control Date Initiated: 01/24/2016 Target Resolution Date: 03/27/2016 Goal Status: Active Interventions: Assess patient nutrition upon admission and as needed per policy Provide education on elevated blood sugars and impact on wound  healing Notes: ` Orientation to the Wound Care Program Nursing Diagnoses: Knowledge deficit related to the wound healing center program Goals: Patient/caregiver will verbalize understanding of the Wound Healing Center Program Date Initiated: 01/24/2016 Target Resolution Date: 03/27/2016 Goal Status: Active Interventions: Provide education on orientation to the wound center Notes: SALAH, NAKAMURA ([id number]) ` Pain, Acute or Chronic Nursing Diagnoses: Pain, acute or chronic: actual or potential Potential alteration in comfort, pain Goals: Patient will verbalize adequate pain control and receive pain control interventions during procedures as needed Date Initiated: 01/24/2016 Target Resolution Date: 03/27/2016 Goal Status: Active Patient/caregiver will verbalize adequate pain control between visits Date Initiated: 01/24/2016 Target Resolution Date: 03/27/2016 Goal Status: Active Patient/caregiver will verbalize comfort level met Date Initiated: 01/24/2016 Target Resolution Date: 03/27/2016 Goal Status: Active Interventions: Assess comfort goal upon admission Complete pain assessment as per visit requirements Notes: ` Pressure Nursing Diagnoses: Knowledge deficit related to management of pressures ulcers Potential for impaired tissue integrity related to pressure, friction, moisture, and shear Goals: Patient will remain free from development of additional pressure ulcers Date Initiated: 01/24/2016 Target Resolution Date: 03/27/2016 Goal Status: Active Interventions: Assess: immobility, friction, shearing, incontinence upon admission and as needed Assess offloading mechanisms upon admission and  as needed Assess potential for pressure ulcer upon admission and as needed Provide education on pressure ulcers Notes: [name], [name] ([id number]) Wound/Skin Impairment Nursing Diagnoses: Impaired tissue integrity Goals: Ulcer/skin breakdown will have a volume reduction  of 30% by week 4 Date Initiated: 01/24/2016 Target Resolution Date: 03/27/2016 Goal Status: Active Ulcer/skin breakdown will have a volume reduction of 50% by week 8 Date Initiated: 01/24/2016 Target Resolution Date: 03/27/2016 Goal Status: Active Ulcer/skin breakdown will have a volume reduction of 80% by week 12 Date Initiated: 01/24/2016 Target Resolution Date: 03/27/2016 Goal Status: Active Interventions: Assess patient/caregiver ability to perform ulcer/skin care regimen upon admission and as needed Assess ulceration(s) every visit Notes: Electronic Signature(s) Signed: 06/05/2016 5:31:29 PM By: Regan Lemming BSN, RN Entered By: Regan Lemming on 06/05/2016 14:45:05 [name], [name] ([id number]) -------------------------------------------------------------------------------- Pain Assessment Details Patient Name: [name] Coast C. Date of Service: 06/05/2016 2:00 PM Medical Record Number: [id number] Patient Account Number: [id number] Date of Birth/Sex: 1950/12/04 (66 y.o. Male) Treating RN: Baruch Gouty, RN, BSN, Velva Harman Primary Care Marylon Verno: Otilio Miu Other Clinician: Referring Dazani Norby: Otilio Miu Treating Kirbie Stodghill/Extender: Ricard Dillon Weeks in Treatment: 19 Active Problems Location of Pain Severity and Description of Pain Patient Has Paino No Site Locations With Dressing Change: No Pain Management and Medication Current Pain Management: Electronic Signature(s) Signed: 06/05/2016 5:31:29 PM By: Regan Lemming BSN, RN Entered By: Regan Lemming on 06/05/2016 13:55:11 [name], [name] ([id number]) -------------------------------------------------------------------------------- Patient/Caregiver Education Details Patient Name: [name] Ship. Date of Service: 06/05/2016 2:00 PM Medical Record Number: [id number] Patient Account Number: [id number] Date of Birth/Gender: 1950/03/17 (66 y.o. Male) Treating RN: Baruch Gouty, RN, BSN, Velva Harman Primary Care Physician: Otilio Miu Other  Clinician: Referring Physician: Otilio Miu Treating Physician/Extender: Tito Dine in Treatment: 58 Education Assessment Education Provided To: Patient Education Topics Provided Elevated Blood Sugar/ Impact on Healing: Methods: Explain/Verbal Responses: State content correctly Pressure: Methods: Explain/Verbal Responses: State content correctly Welcome To [facility] [place]: Methods: Explain/Verbal Responses: State content correctly Wound Debridement: Methods: Explain/Verbal Responses: State content correctly Wound/Skin Impairment: Methods: Explain/Verbal Responses: State content correctly Electronic Signature(s) Signed: 06/05/2016 5:31:29 PM By: Regan Lemming BSN, RN Entered By: Regan Lemming on 06/05/2016 14:57:43 Oneil, [name] ([id number]) -------------------------------------------------------------------------------- Wound Assessment Details Patient Name: [name], [name] Date of Service: 06/05/2016 2:00 PM Medical Record Number: [id number] Patient Account Number: [id number] Date of Birth/Sex: 1950-09-28 (66 y.o. Male) Treating RN: Baruch Gouty, RN, BSN, [place] Primary Care Zakia Sainato: Otilio Miu Other Clinician: Referring Dinnis Rog: Otilio Miu Treating Sheyli Horwitz/Extender: Ricard Dillon Weeks in Treatment: 19 Wound Status Wound Number: 1 Primary Pressure Ulcer Etiology: Wound Location: Right Back - Proximal Wound Open Wounding Event: Pressure Injury Status: Date Acquired: 11/24/2015 Comorbid Coronary Artery Disease, Type II Weeks Of Treatment: 19 History: Diabetes, Quadriplegia, Seizure Clustered Wound: No Disorder Photos Photo Uploaded By: Regan Lemming on 06/05/2016 17:26:32 Wound Measurements Length: (cm) 1.7 Width: (cm) 1.1 Depth: (cm) 0.1 Area: (cm) 1.469 Volume: (cm) 0.147 % Reduction in Area: 81.8% % Reduction in Volume: 81.7% Epithelialization: Small (1-33%) Tunneling: No Undermining: No Wound Description Classification:  Category/Stage II Wound Margin: Flat and Intact Exudate Amount: Medium Exudate Type: Serous Exudate Color: amber Foul Odor After Cleansing: No Slough/Fibrino Yes Wound Bed Granulation Amount: Medium (34-66%) Exposed Structure Granulation Quality: Red, Pink Fascia Exposed: No Necrotic Amount: Medium (34-66%) Fat Layer (Subcutaneous Tissue) Exposed: Yes Necrotic Quality: Adherent Slough Tendon Exposed: No Geimer, Kieren C. ([id number]) Muscle Exposed: No Joint Exposed: No Bone Exposed: No Periwound Skin Texture Texture Color No Abnormalities  Noted: No No Abnormalities Noted: No Callus: No Atrophie Blanche: No Crepitus: No Cyanosis: No Excoriation: Yes Ecchymosis: No Induration: No Erythema: Yes Rash: No Erythema Location: Circumferential Scarring: No Hemosiderin Staining: No Mottled: No Moisture Pallor: No No Abnormalities Noted: No Rubor: No Dry / Scaly: No Maceration: No Temperature / Pain Temperature: No Abnormality Tenderness on Palpation: Yes Wound Preparation Ulcer Cleansing: Rinsed/Irrigated with Saline Topical Anesthetic Applied: Other: lidocaine 4%, Treatment Notes Wound #1 (Right, Proximal Back) 1. Cleansed with: Clean wound with Normal Saline 4. Dressing Applied: Hydrafera Blue 5. Secondary Dressing Applied Bordered Foam Dressing Dry Gauze Electronic Signature(s) Signed: 06/05/2016 5:31:29 PM By: Regan Lemming BSN, RN Entered By: Regan Lemming on 06/05/2016 14:06:33 [place], [name] ([id number]) -------------------------------------------------------------------------------- [place] Details Patient Name: [name] Coast C. Date of Service: 06/05/2016 2:00 PM Medical Record Number: [id number] Patient Account Number: [id number] Date of Birth/Sex: [birth year] (66 y.o. Male) Treating RN: Baruch Gouty, RN, BSN, [place] Primary Care Gaetana Kawahara: Otilio Miu Other Clinician: Referring Chenel Wernli: Otilio Miu Treating Lalla Laham/Extender: Ricard Dillon Weeks in  Treatment: 19 Vital Signs Time Taken: 13:55 Pulse (bpm): 72 Height (in): 70 Respiratory Rate (breaths/min): 16 Weight (lbs): 187 Blood Pressure (mmHg): 88/45 Body Mass Index (BMI): 26.8 Reference Range: 80 - 120 mg / dl Notes unable to obtain temp oral x2, axillary. Body cold to touch. Wife says he normally has cold temp. Electronic Signature(s) Signed: 06/05/2016 5:31:29 PM By: Regan Lemming BSN, RN Entered By: Regan Lemming on 06/05/2016 14:02:55

## 2016-06-07 NOTE — Progress Notes (Signed)
JAHEL, WAVRA ([id number]) Visit Report for 06/05/2016 Chief Complaint Document Details Patient Name: [name], [name]. Date of Service: 06/05/2016 2:00 PM Medical Record Number: [id number] Patient Account Number: [id number] Date of Birth/Sex: May 20, 1950 (66 y.o. Male) Treating RN: Clover Mealy, RN, BSN, [place] Sink Primary Care Provider: Elizabeth Sauer Other Clinician: Referring Provider: Elizabeth Sauer Treating Provider/Extender: Maxwell Caul Weeks in Treatment: 19 Information Obtained from: Patient Chief Complaint 01/24/16 patient arrives today for review of a pressure ulcer on his right scapula in the setting of incomplete C3-C4 quadriplegia Electronic Signature(s) Signed: 06/05/2016 5:28:32 PM By: Baltazar Najjar MD Entered By: Baltazar Najjar on 06/05/2016 15:57:00 Mcneel, [name] ([id number]) -------------------------------------------------------------------------------- Debridement Details Patient Name: [name]. Date of Service: 06/05/2016 2:00 PM Medical Record Number: [id number] Patient Account Number: [id number] Date of Birth/Sex: 1950/08/18 (66 y.o. Male) Treating RN: Afful, RN, BSN, Rita Primary Care Provider: Elizabeth Sauer Other Clinician: Referring Provider: Elizabeth Sauer Treating Provider/Extender: Altamese [place] in Treatment: 19 Debridement Performed for Wound #1 Right,Proximal Back Assessment: Performed By: Physician Maxwell Caul, MD Debridement: Debridement Pre-procedure Yes - 14:45 Verification/Time Out Taken: Start Time: 14:45 Pain Control: Lidocaine 4% Topical Solution Level: Skin/Subcutaneous Tissue Total Area Debrided (L x 1.7 (cm) x 1.1 (cm) = 1.87 (cm) W): Tissue and other Non-Viable, Fibrin/Slough, Subcutaneous material debrided: Instrument: Curette Bleeding: Minimum Hemostasis Achieved: Pressure End Time: 14:47 Procedural Pain: 0 Post Procedural Pain: 0 Response to Treatment: Procedure was tolerated well Post Debridement  Measurements of Total Wound Length: (cm) 1.7 Stage: Category/Stage III Width: (cm) 1.1 Depth: (cm) 0.2 Volume: (cm) 0.294 Character of Wound/Ulcer Post Stable Debridement: Severity of Tissue Post Fat layer exposed Debridement: Post Procedure Diagnosis Same as Pre-procedure Electronic Signature(s) Signed: 06/05/2016 5:28:32 PM By: Baltazar Najjar MD Signed: 06/05/2016 5:31:29 PM By: Elpidio Eric BSN, RN Entered By: Baltazar Najjar on 06/05/2016 15:56:35 [name], [name] ([id number]) [name], [name] ([id number]) -------------------------------------------------------------------------------- HPI Details Patient Name: [name], [name]. Date of Service: 06/05/2016 2:00 PM Medical Record Number: [id number] Patient Account Number: [id number] Date of Birth/Sex: [birth year] (66 y.o. 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 He has lower extremity sensation and can move his legs but has no/limited control over his arms. His wife accompanies him today and states that in the late spring or early summer of 2017 the patient became very depressed. He refused the refused to mobilize and he developed several pressure ulcers on his back. Most of these have healed however they have a recalcitrant area over the right scapula. They've been using Santyl on this for at least the last month. In terms of depression the patient is doing better now on an antidepressant. He saw his primary physician on 01/12/16 at which time there was apparently green drainage coming out of this area [Dr. Deana Jones]. Dr. Yetta Barre works in the [facility]. He  has completed this Septra. Otherwise looking through [facility] notes that he has a history of seizures. He also had a stroke in 2008 he follows with neurology. He also has type 2 diabetes on Glucophage, hyperlipidemia and gastroesophageal reflux. He takes Plavix for stroke prevention and Keppra for seizure prophylaxis. 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 02/08/16; this is a patient with a pressure ulcer over the right scapula. His wife has been doing the dressing with Santyl and border foam change every second day. When he arrived here 2 weeks ago we did a fairly extensive mechanical debridement. We are asked medical modalities to go out to the home and see what they might be eligible for in terms of pressure-relief surfaces [level 2] but the wife states that they have not heard from them. 03/20/15; this is a patient we haven't seen in 5-6 weeks. This was largely due to transportation issues. They've been using Santyl and border foam changing every second day for a pressure injury over the right scapula. The patient has a C3-C4 spinal injury. We had also asked for a review by the people who supplied DME to look at his wheelchair cushion, mattress etc. I don't know that this ever happened. In the meantime the wound has done remarkably well current measurements 1.8 x 2 x 0.1 04/03/16; the patient's dimensions have gone up to 3 cm in diameter quite a deterioration from last time. He is also complaining of pain in this area which is new. 04/10/16; 3 x 1.5 x 0.1. No difference from last week. Culture I did last week was negative he has completed antibiotics. 04/24/16 2.4 x 2 x 0.1; wound generally looks smaller. Middle area that I had to debrided last time looks healthier. His son is fashioned a large piece of foam cut out where the patient's wound with hit the back of his wheelchair 05/01/16; wound is a same size however the  surface of this looks better. The middle innkeeper area required a repeat debridement 05/15/16; wound is down and dimensions granulation still looks healthy. The medial aspect of this wound is now the deeper Divot. We'll see how this responds to further healing. We're using Hydrofera Blue 06/05/16; Wound 1.7x1.1x0.1 still using hyudrofera blue Electronic Signature(s) BODI, PALMERI ([id number]) Signed: 06/05/2016 5:28:32 PM By: Baltazar Najjar MD Entered By: Baltazar Najjar on 06/05/2016 15:59:24 [name], [name] ([id number]) -------------------------------------------------------------------------------- Physical Exam Details Patient Name: [name], [name]. Date of Service: 06/05/2016 2:00 PM Medical Record Number: [id number] Patient Account Number: [id number] Date of Birth/Sex: 13-Aug-1950 (66 y.o. Male) Treating RN: Clover Mealy, RN, BSN, [place] Sink Primary Care Provider: Elizabeth Sauer Other Clinician: Referring Provider: Elizabeth Sauer Treating Provider/Extender: Maxwell Caul Weeks in Treatment: 71 Constitutional Patient is hypotensive.. Pulse regular and within target range for patient.Marland Kitchen Respirations regular, non-labored and within target range.. Temperature is normal and within the target range for the patient.. No change in height.. Eyes Conjunctivae clear. No discharge.. Ears, Nose, Mouth, and Throat Oropharynx within normal limits, without erythema, exudate or ulceration.Marland Kitchen Respiratory Respiratory effort is easy and symmetric bilaterally. Rate is normal at rest and on room air.. Bilateral breath sounds are clear and equal in all lobes with no wheezes, rales or rhonchi.. Cardiovascular Heart rhythm and rate regular, without murmur or gallop.not overtly dehydrated. Gastrointestinal (GI) no masses. suprapubic catheter looks fine. No liver or spleen enlargement or tenderness.. Notes wound exam; pressure are over the right scapula. in general looks better. smaller in dimensions. Using a  #3 curette the area is debrided. of adherent non viable material .hemostasis with direct pressure Electronic Signature(s) Signed: 06/05/2016 5:28:32 PM By: Baltazar Najjar MD Entered By: Baltazar Najjar on 06/05/2016 16:02:47 [name], [name] ([id number]) -------------------------------------------------------------------------------- Physician Orders Details Patient Name: [name], [name]. Date of Service: 06/05/2016  2:00 PM Medical Record Number: [id number] Patient Account Number: [id number] Date of Birth/Sex: Jan 01, 1951 (66 y.o. Male) Treating RN: Clover Mealy, RN, BSN, [place] Sink Primary Care Provider: Elizabeth Sauer Other Clinician: Referring Provider: Elizabeth Sauer Treating Provider/Extender: Altamese [place] in Treatment: 54 Verbal / Phone Orders: No Diagnosis Coding Wound Cleansing Wound #1 Right,Proximal Back o Clean wound with Normal Saline. o Cleanse wound with mild soap and water Anesthetic Wound #1 Right,Proximal Back o Topical Lidocaine 4% cream applied to wound bed prior to debridement - CLINIC USE Skin Barriers/Peri-Wound Care Wound #1 Right,Proximal Back o Skin Prep Primary Wound Dressing Wound #1 Right,Proximal Back o Hydrafera Blue Secondary Dressing Wound #1 Right,Proximal Back o Dry Gauze o Boardered Foam Dressing Dressing Change Frequency Wound #1 Right,Proximal Back o Change dressing every other day. - wet H.BLue with water before prior to removing it from the dressings Follow-up Appointments Wound #1 Right,Proximal Back o Return Appointment in 2 weeks. Off-Loading Wound #1 Right,Proximal Back o [facility] does not cover because they out of network. Patient wife to call the insurance company to find out with DME company is in network. [name], [name] ([id number]) o Turn and reposition every 2 hours o Other: - Pad for wheelchair for positioning Additional Orders / Instructions Wound #1 Right,Proximal Back o  Increase protein intake. Medications-please add to medication list. Wound #1 Right,Proximal Back o Other: - VITAMIN [name], ZINC, VIt A, MVI Electronic Signature(s) Signed: 06/05/2016 5:28:32 PM By: Baltazar Najjar MD Signed: 06/05/2016 5:31:29 PM By: Elpidio Eric BSN, RN Entered By: Elpidio Eric on 06/05/2016 14:56:26 [name], [name] ([id number]) -------------------------------------------------------------------------------- Problem List Details Patient Name: [name], [name]. Date of Service: 06/05/2016 2:00 PM Medical Record Number: [id number] Patient Account Number: [id number] Date of Birth/Sex: 1950/12/13 (66 y.o. Male) Treating RN: Clover Mealy, RN, BSN, [place] Sink Primary Care Provider: Elizabeth Sauer Other Clinician: Referring Provider: Elizabeth Sauer Treating Provider/Extender: Maxwell Caul Weeks in Treatment: 19 Active Problems ICD-10 Encounter Code Description Active Date Diagnosis 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, stage 3 01/24/2016 Yes S14.103S Unspecified injury at C3 level of cervical spinal cord, 01/24/2016 Yes sequela Inactive Problems Resolved Problems Electronic Signature(s) Signed: 06/05/2016 5:28:32 PM By: Baltazar Najjar MD Entered By: Baltazar Najjar on 06/05/2016 15:56:15 Gohman, [name] ([id number]) -------------------------------------------------------------------------------- Progress Note Details Patient Name: [name]. Date of Service: 06/05/2016 2:00 PM Medical Record Number: [id number] Patient Account Number: [id number] Date of Birth/Sex: [birth year] (66 y.o. 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 He has lower extremity sensation and can move his legs but has no/limited control over his arms. His wife accompanies him today and states that in the late spring or early summer of 2017 the patient became very depressed. He refused the refused to mobilize and he developed several pressure ulcers on his back. Most of these have healed however they have a recalcitrant area over the right scapula. They've been using Santyl on this for at least the last month. In terms of depression the patient is doing better now on an antidepressant. He saw  his primary physician on 01/12/16 at which time there was apparently green drainage coming out of this area [Dr. Deana Jones]. Dr. Yetta Barre works in the [facility]. He has completed this Septra. Otherwise looking [facility] notes that he has a history of seizures. He also had a stroke in 2008 he follows with neurology. He also has type 2 diabetes on Glucophage, hyperlipidemia and gastroesophageal reflux. He takes Plavix for stroke prevention and Keppra for seizure prophylaxis. 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 02/08/16; this is a patient with a pressure ulcer over the right scapula. His wife has been doing the dressing with Santyl and border foam change every second day. When he arrived here 2 weeks ago we did a fairly extensive mechanical debridement. We are asked medical modalities to go out to the home and see what they might be eligible for in terms of pressure-relief surfaces [level 2] but the wife states that they have not heard from them. 03/20/15; this is a patient we haven't seen in 5-6 weeks. This was largely due to transportation issues. They've been  using Santyl and border foam changing every second day for a pressure injury over the right scapula. The patient has a C3-C4 spinal injury. We had also asked for a review by the people who supplied DME to look at his wheelchair cushion, mattress etc. I don't know that this ever happened. In the meantime the wound has done remarkably well current measurements 1.8 x 2 x 0.1 04/03/16; the patient's dimensions have gone up to 3 cm in diameter quite a deterioration from last time. He is also complaining of pain in this area which is new. 04/10/16; 3 x 1.5 x 0.1. No difference from last week. Culture I did last week was negative he has completed antibiotics. 04/24/16 2.4 x 2 x 0.1; wound generally looks smaller. Middle area that I had to debrided last time looks healthier. His son is fashioned a large piece of foam cut out where the patient's wound with hit the back of his wheelchair 05/01/16; wound is a same size however the surface of this looks better. The middle innkeeper area required [facility], BILBO CARCAMO. ([id number]) a repeat debridement 05/15/16; wound is down and dimensions granulation still looks healthy. The medial aspect of this wound is now the deeper Divot. We'll see how this responds to further healing. We're using Hydrofera Blue 06/05/16; Wound 1.7x1.1x0.1 still using hyudrofera blue Objective Constitutional Patient is hypotensive.. Pulse regular and within target range for patient.Marland Kitchen Respirations regular, non-labored and within target range.. Temperature is normal and within the target range for the patient.. No change in height.. Vitals Time Taken: 1:55 PM, Height: 70 in, Weight: 187 lbs, BMI: 26.8, Pulse: 72 bpm, Respiratory Rate: 16 breaths/min, Blood Pressure: 88/45 mmHg. General Notes: unable to obtain temp oral x2, axillary. Body cold to touch. Wife says he normally has cold temp. Eyes Conjunctivae clear. No discharge.. Ears, Nose, Mouth, and Throat Oropharynx within normal limits,  without erythema, exudate or ulceration.Marland Kitchen Respiratory Respiratory effort is easy and symmetric bilaterally. Rate is normal at rest and on room air.. Bilateral breath sounds are clear and equal in all lobes with no wheezes, rales or rhonchi.. Cardiovascular Heart rhythm and rate regular, without murmur or gallop.not overtly dehydrated. Gastrointestinal (GI) no masses. suprapubic catheter looks fine. No liver or spleen enlargement or tenderness.. General Notes: wound exam; pressure are over the  right scapula. in general looks better. smaller in dimensions. Using a #3 curette the area is debrided. of adherent non viable material .hemostasis with direct pressure Integumentary (Hair, Skin) Wound #1 status is Open. Original cause of wound was Pressure Injury. The wound is located on the Right,Proximal Back. The wound measures 1.7cm length x 1.1cm width x 0.1cm depth; 1.469cm^2 area and 0.147cm^3 volume. There is Fat Layer (Subcutaneous Tissue) Exposed exposed. There is no tunneling or undermining noted. There is a medium amount of serous drainage noted. The wound margin is flat and intact. There is medium (34-66%) red, pink granulation within the wound bed. There is a medium (34-66%) Toenjes, Maliki [name]. ([id number]) amount of necrotic tissue within the wound bed including Adherent Slough. The periwound skin appearance exhibited: Excoriation, Erythema. The periwound skin appearance did not exhibit: Callus, Crepitus, Induration, Rash, Scarring, Dry/Scaly, Maceration, Atrophie Blanche, Cyanosis, Ecchymosis, Hemosiderin Staining, Mottled, Pallor, Rubor. The surrounding wound skin color is noted with erythema which is circumferential. Periwound temperature was noted as No Abnormality. The periwound has tenderness on palpation. 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, stage 3 S14.103S - Unspecified injury at C3 level of cervical spinal cord, sequela Procedures Wound  #1 Wound #1 is a Pressure Ulcer located on the Right,Proximal Back . There was a Skin/Subcutaneous Tissue Debridement ([id number]) debridement with total area of 1.87 sq cm performed by Maxwell Caul, MD. with the following instrument(s): Curette to remove Non-Viable tissue/material including Fibrin/Slough and Subcutaneous after achieving pain control using Lidocaine 4% Topical Solution. A time out was conducted at 14:45, prior to the start of the procedure. A Minimum amount of bleeding was controlled with Pressure. The procedure was tolerated well with a pain level of 0 throughout and a pain level of 0 following the procedure. Post Debridement Measurements: 1.7cm length x 1.1cm width x 0.2cm depth; 0.294cm^3 volume. Post debridement Stage noted as Category/Stage III. Character of Wound/Ulcer Post Debridement is stable. Severity of Tissue Post Debridement is: Fat layer exposed. Post procedure Diagnosis Wound #1: Same as Pre-Procedure Plan Wound Cleansing: Wound #1 Right,Proximal Back: Clean wound with Normal Saline. Cleanse wound with mild soap and water Sorber, Emon [name]. ([id number]) Anesthetic: Wound #1 Right,Proximal Back: Topical Lidocaine 4% cream applied to wound bed prior to debridement - CLINIC USE Skin Barriers/Peri-Wound Care: Wound #1 Right,Proximal Back: Skin Prep Primary Wound Dressing: Wound #1 Right,Proximal Back: Hydrafera Blue Secondary Dressing: Wound #1 Right,Proximal Back: Dry Gauze Boardered Foam Dressing Dressing Change Frequency: Wound #1 Right,Proximal Back: Change dressing every other day. - wet H.BLue with water before prior to removing it from the dressings Follow-up Appointments: Wound #1 Right,Proximal Back: Return Appointment in 2 weeks. Off-Loading: Wound #1 Right,Proximal Back: [employer] does not cover because they out of network. Patient wife to call the insurance company to find out with DME company is in network. Turn  and reposition every 2 hours Other: - Pad for wheelchair for positioning Additional Orders / Instructions: Wound #1 Right,Proximal Back: Increase protein intake. Medications-please add to medication list.: Wound #1 Right,Proximal Back: Other: - VITAMIN [name], ZINC, VIt A, MVI 1) non change to hydrofera 2) his wife has described increasing confusion/delirium. I have advised a semiurgent primary care f/u/ Dr. Yetta Barre in [place]. Electronic Signature(s) Signed: 06/05/2016 5:28:32 PM By: Baltazar Najjar MD Entered By: Baltazar Najjar on 06/05/2016 16:04:36 Bisher, [name] ([id number]) -------------------------------------------------------------------------------- SuperBill Details Patient Name: [name]. Date of Service: 06/05/2016 Medical Record Number: [id number] Patient Account Number:  [id number] Date of Birth/Sex: 1950-03-10 (66 y.o. Male) Treating RN: Clover Mealy, RN, BSN, [place] Sink Primary Care Provider: Elizabeth Sauer Other Clinician: Referring Provider: Elizabeth Sauer Treating Provider/Extender: Maxwell Caul Weeks in Treatment: 19 Diagnosis Coding ICD-10 Codes Code Description [phone] Pressure ulcer of other site, stage 3 S14.103S Unspecified injury at C3 level of cervical spinal cord, sequela Facility Procedures CPT4 Code: [id number] Description: 11042 - DEB SUBQ TISSUE 20 SQ CM/< ICD-10 Description Diagnosis 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, stage 3 Modifier: Quantity: 1 Physician Procedures CPT4 Code: [id number] Description: 11042 - WC PHYS SUBQ TISS 20 SQ CM ICD-10 Description Diagnosis 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, stage 3 Modifier: Quantity: 1 Electronic Signature(s) Signed: 06/05/2016 5:28:32 PM By: Baltazar Najjar MD Entered By: Baltazar Najjar on 06/05/2016 16:45:24

## 2016-06-09 ENCOUNTER — Inpatient Hospital Stay: Payer: Medicare Other

## 2016-06-09 ENCOUNTER — Emergency Department: Payer: Medicare Other

## 2016-06-09 ENCOUNTER — Inpatient Hospital Stay
Admission: EM | Admit: 2016-06-09 | Discharge: 2016-06-10 | DRG: 698 | Disposition: A | Discharge status: Home / Self Care | Payer: Medicare Other | Attending: Internal Medicine | Admitting: Internal Medicine

## 2016-06-09 ENCOUNTER — Encounter: Payer: Self-pay | Admitting: Emergency Medicine

## 2016-06-09 DIAGNOSIS — Z79899 Other long term (current) drug therapy: Secondary | ICD-10-CM

## 2016-06-09 DIAGNOSIS — E871 Hypo-osmolality and hyponatremia: Secondary | ICD-10-CM

## 2016-06-09 DIAGNOSIS — T83518A Infection and inflammatory reaction due to other urinary catheter, initial encounter: Principal | ICD-10-CM | POA: Diagnosis present

## 2016-06-09 DIAGNOSIS — G9341 Metabolic encephalopathy: Secondary | ICD-10-CM | POA: Diagnosis present

## 2016-06-09 DIAGNOSIS — F419 Anxiety disorder, unspecified: Secondary | ICD-10-CM | POA: Diagnosis present

## 2016-06-09 DIAGNOSIS — N3000 Acute cystitis without hematuria: Secondary | ICD-10-CM | POA: Diagnosis not present

## 2016-06-09 DIAGNOSIS — Z930 Tracheostomy status: Secondary | ICD-10-CM | POA: Diagnosis not present

## 2016-06-09 DIAGNOSIS — Y732 Prosthetic and other implants, materials and accessory gastroenterology and urology devices associated with adverse incidents: Secondary | ICD-10-CM | POA: Diagnosis present

## 2016-06-09 DIAGNOSIS — G959 Disease of spinal cord, unspecified: Secondary | ICD-10-CM | POA: Diagnosis present

## 2016-06-09 DIAGNOSIS — K210 Gastro-esophageal reflux disease with esophagitis: Secondary | ICD-10-CM | POA: Diagnosis not present

## 2016-06-09 DIAGNOSIS — G40909 Epilepsy, unspecified, not intractable, without status epilepticus: Secondary | ICD-10-CM | POA: Diagnosis present

## 2016-06-09 DIAGNOSIS — Z7984 Long term (current) use of oral hypoglycemic drugs: Secondary | ICD-10-CM | POA: Diagnosis not present

## 2016-06-09 DIAGNOSIS — L89113 Pressure ulcer of right upper back, stage 3: Secondary | ICD-10-CM | POA: Diagnosis present

## 2016-06-09 DIAGNOSIS — E119 Type 2 diabetes mellitus without complications: Secondary | ICD-10-CM | POA: Diagnosis not present

## 2016-06-09 DIAGNOSIS — R638 Other symptoms and signs concerning food and fluid intake: Secondary | ICD-10-CM | POA: Diagnosis not present

## 2016-06-09 DIAGNOSIS — N390 Urinary tract infection, site not specified: Secondary | ICD-10-CM | POA: Diagnosis not present

## 2016-06-09 DIAGNOSIS — Z66 Do not resuscitate: Secondary | ICD-10-CM | POA: Diagnosis present

## 2016-06-09 DIAGNOSIS — R410 Disorientation, unspecified: Secondary | ICD-10-CM

## 2016-06-09 DIAGNOSIS — Z7902 Long term (current) use of antithrombotics/antiplatelets: Secondary | ICD-10-CM | POA: Diagnosis not present

## 2016-06-09 DIAGNOSIS — Z8673 Personal history of transient ischemic attack (TIA), and cerebral infarction without residual deficits: Secondary | ICD-10-CM

## 2016-06-09 DIAGNOSIS — Z960 Presence of urogenital implants: Secondary | ICD-10-CM | POA: Diagnosis not present

## 2016-06-09 DIAGNOSIS — E785 Hyperlipidemia, unspecified: Secondary | ICD-10-CM | POA: Diagnosis not present

## 2016-06-09 DIAGNOSIS — G8250 Quadriplegia, unspecified: Secondary | ICD-10-CM | POA: Diagnosis present

## 2016-06-09 LAB — CBC
HEMATOCRIT: 31.9 % — AB (ref 40.0–52.0)
Hemoglobin: 10.7 g/dL — ABNORMAL LOW (ref 13.0–18.0)
MCH: 27.7 pg (ref 26.0–34.0)
MCHC: 33.5 g/dL (ref 32.0–36.0)
MCV: 82.7 fL (ref 80.0–100.0)
PLATELETS: 191 10*3/uL (ref 150–440)
RBC: 3.86 MIL/uL — ABNORMAL LOW (ref 4.40–5.90)
RDW: 18.3 % — AB (ref 11.5–14.5)
WBC: 11.3 10*3/uL — AB (ref 3.8–10.6)

## 2016-06-09 LAB — COMPREHENSIVE METABOLIC PANEL
ALT: 27 U/L (ref 17–63)
ANION GAP: 9 (ref 5–15)
AST: 39 U/L (ref 15–41)
Albumin: 3.2 g/dL — ABNORMAL LOW (ref 3.5–5.0)
Alkaline Phosphatase: 141 U/L — ABNORMAL HIGH (ref 38–126)
BILIRUBIN TOTAL: 0.6 mg/dL (ref 0.3–1.2)
BUN: 21 mg/dL — AB (ref 6–20)
CHLORIDE: 103 mmol/L (ref 101–111)
CO2: 19 mmol/L — ABNORMAL LOW (ref 22–32)
Calcium: 8.8 mg/dL — ABNORMAL LOW (ref 8.9–10.3)
Creatinine, Ser: 1.22 mg/dL (ref 0.61–1.24)
GFR calc Af Amer: >60 mL/min (ref >60)
Glucose, Bld: 171 mg/dL — ABNORMAL HIGH (ref 65–99)
POTASSIUM: 4.3 mmol/L (ref 3.5–5.1)
Sodium: 131 mmol/L — ABNORMAL LOW (ref 135–145)
TOTAL PROTEIN: 7.9 g/dL (ref 6.5–8.1)

## 2016-06-09 LAB — GLUCOSE, CAPILLARY
GLUCOSE-CAPILLARY: 89 mg/dL (ref 65–99)
GLUCOSE-CAPILLARY: 104 mg/dL — AB (ref 65–99)

## 2016-06-09 LAB — URINALYSIS, COMPLETE (UACMP) WITH MICROSCOPIC
BILIRUBIN URINE: NEGATIVE
GLUCOSE, UA: NEGATIVE mg/dL
Ketones, ur: NEGATIVE mg/dL
Nitrite: POSITIVE — AB
PROTEIN: 100 mg/dL — AB
SPECIFIC GRAVITY, URINE: 1.012 (ref 1.005–1.030)
pH: 7 (ref 5.0–8.0)

## 2016-06-09 LAB — TROPONIN I

## 2016-06-09 MED ORDER — HYDROCODONE-ACETAMINOPHEN 5-325 MG PO TABS: 1.0000 | ORAL_TABLET | ORAL | Status: DC | PRN

## 2016-06-09 MED ORDER — SENNOSIDES-DOCUSATE SODIUM 8.6-50 MG PO TABS
1.0000 | ORAL_TABLET | Freq: Every evening | ORAL | Status: DC | PRN
Start: 2016-06-09 — End: 2016-06-10

## 2016-06-09 MED ORDER — SODIUM CHLORIDE 0.9 % IV SOLN
INTRAVENOUS | Status: DC
  Administered 2016-06-09: 17:00:00 via INTRAVENOUS

## 2016-06-09 MED ORDER — POLYETHYLENE GLYCOL 3350 17 G PO PACK
17.0000 g | PACK | Freq: Every day | ORAL | Status: DC
  Filled 2016-06-09: qty 1

## 2016-06-09 MED ORDER — BISACODYL 10 MG RE SUPP
10.0000 mg | Freq: Once | RECTAL | Status: DC
  Filled 2016-06-09: qty 1

## 2016-06-09 MED ORDER — CLOPIDOGREL BISULFATE 75 MG PO TABS
75.0000 mg | ORAL_TABLET | Freq: Every day | ORAL | Status: DC
  Administered 2016-06-10: 75 mg via ORAL
  Filled 2016-06-09: qty 1

## 2016-06-09 MED ORDER — KETOROLAC TROMETHAMINE 15 MG/ML IJ SOLN: 15.0000 mg | Freq: Four times a day (QID) | INTRAMUSCULAR | Status: DC | PRN

## 2016-06-09 MED ORDER — INSULIN ASPART 100 UNIT/ML ~~LOC~~ SOLN
0.0000 [IU] | Freq: Three times a day (TID) | SUBCUTANEOUS | Status: DC
  Administered 2016-06-10: 2 [IU] via SUBCUTANEOUS
  Filled 2016-06-09: qty 2

## 2016-06-09 MED ORDER — ONDANSETRON HCL 4 MG/2ML IJ SOLN: 4.0000 mg | Freq: Four times a day (QID) | INTRAMUSCULAR | Status: DC | PRN

## 2016-06-09 MED ORDER — SODIUM CHLORIDE 0.9 % IV BOLUS (SEPSIS)
1000.0000 mL | Freq: Once | INTRAVENOUS | Status: AC
  Administered 2016-06-09: 1000 mL via INTRAVENOUS

## 2016-06-09 MED ORDER — BISACODYL 5 MG PO TBEC: 5.0000 mg | DELAYED_RELEASE_TABLET | Freq: Every day | ORAL | Status: DC | PRN

## 2016-06-09 MED ORDER — CEFTRIAXONE SODIUM-DEXTROSE 1-3.74 GM-% IV SOLR
1.0000 g | Freq: Once | INTRAVENOUS | Status: AC
  Administered 2016-06-09: 1 g via INTRAVENOUS
  Filled 2016-06-09: qty 50

## 2016-06-09 MED ORDER — ENOXAPARIN SODIUM 40 MG/0.4ML ~~LOC~~ SOLN
40.0000 mg | SUBCUTANEOUS | Status: DC
  Administered 2016-06-10: 40 mg via SUBCUTANEOUS
  Filled 2016-06-09: qty 0.4

## 2016-06-09 MED ORDER — BACLOFEN 10 MG PO TABS
20.0000 mg | ORAL_TABLET | Freq: Two times a day (BID) | ORAL | Status: DC
  Administered 2016-06-09 – 2016-06-10 (×2): 20 mg via ORAL
  Filled 2016-06-09 (×2): qty 2

## 2016-06-09 MED ORDER — DIAZEPAM 5 MG PO TABS: 5.0000 mg | ORAL_TABLET | Freq: Three times a day (TID) | ORAL | Status: DC | PRN

## 2016-06-09 MED ORDER — ACETAMINOPHEN 650 MG RE SUPP
650.0000 mg | Freq: Four times a day (QID) | RECTAL | Status: DC | PRN
Start: 2016-06-09 — End: 2016-06-10

## 2016-06-09 MED ORDER — SODIUM CHLORIDE 0.9 % IR SOLN
1.0000 "application " | Freq: Every day | Status: DC
  Administered 2016-06-09: 1

## 2016-06-09 MED ORDER — MIRTAZAPINE 15 MG PO TABS
15.0000 mg | ORAL_TABLET | Freq: Every day | ORAL | Status: DC
  Administered 2016-06-09: 15 mg via ORAL
  Filled 2016-06-09: qty 1

## 2016-06-09 MED ORDER — FUROSEMIDE 20 MG PO TABS
20.0000 mg | ORAL_TABLET | Freq: Every day | ORAL | Status: DC
Start: 2016-06-10 — End: 2016-06-10
  Administered 2016-06-10: 20 mg via ORAL
  Filled 2016-06-09: qty 1

## 2016-06-09 MED ORDER — PANTOPRAZOLE SODIUM 40 MG PO TBEC
40.0000 mg | DELAYED_RELEASE_TABLET | Freq: Every day | ORAL | Status: DC
  Administered 2016-06-10: 40 mg via ORAL

## 2016-06-09 MED ORDER — CEFTRIAXONE SODIUM 1 G IJ SOLR: 1.0000 g | Freq: Once | INTRAMUSCULAR | Status: DC

## 2016-06-09 MED ORDER — LEVETIRACETAM 500 MG PO TABS
500.0000 mg | ORAL_TABLET | Freq: Three times a day (TID) | ORAL | Status: DC
Start: 2016-06-09 — End: 2016-06-10
  Administered 2016-06-09 – 2016-06-10 (×3): 500 mg via ORAL
  Filled 2016-06-09 (×3): qty 1

## 2016-06-09 MED ORDER — ACETAMINOPHEN 325 MG PO TABS
650.0000 mg | ORAL_TABLET | Freq: Four times a day (QID) | ORAL | Status: DC | PRN
Start: 2016-06-09 — End: 2016-06-10

## 2016-06-09 MED ORDER — DOCUSATE SODIUM 100 MG PO CAPS
200.0000 mg | ORAL_CAPSULE | Freq: Two times a day (BID) | ORAL | Status: DC
  Administered 2016-06-09 – 2016-06-10 (×2): 200 mg via ORAL
  Filled 2016-06-09 (×2): qty 2

## 2016-06-09 MED ORDER — OXYBUTYNIN CHLORIDE 5 MG PO TABS
5.0000 mg | ORAL_TABLET | Freq: Three times a day (TID) | ORAL | Status: DC
  Administered 2016-06-09 – 2016-06-10 (×3): 5 mg via ORAL
  Filled 2016-06-09 (×5): qty 1

## 2016-06-09 MED ORDER — ONDANSETRON HCL 4 MG PO TABS
4.0000 mg | ORAL_TABLET | Freq: Four times a day (QID) | ORAL | Status: DC | PRN
Start: 2016-06-09 — End: 2016-06-10

## 2016-06-09 MED ORDER — ATORVASTATIN CALCIUM 10 MG PO TABS
10.0000 mg | ORAL_TABLET | Freq: Every day | ORAL | Status: DC
  Administered 2016-06-10: 10 mg via ORAL
  Filled 2016-06-09: qty 1

## 2016-06-09 MED ORDER — CITALOPRAM HYDROBROMIDE 20 MG PO TABS
20.0000 mg | ORAL_TABLET | Freq: Every day | ORAL | Status: DC
Start: 2016-06-10 — End: 2016-06-10
  Administered 2016-06-10: 20 mg via ORAL
  Filled 2016-06-09: qty 1

## 2016-06-09 NOTE — ED Triage Notes (Signed)
Pt to ed with c/o altered mental status and confusion x 1 week.  Per caregiver this happens when he has a UTI.

## 2016-06-09 NOTE — H&P (Signed)
[employer] [facility] [facility] [place] [facility]   PATIENT NAME: [name]    MR#:  [id number]  DATE OF BIRTH:  07-14-1950  DATE OF ADMISSION:  06/09/2016  PRIMARY CARE PHYSICIAN: Elizabeth Sauer, MD   REQUESTING/REFERRING PHYSICIAN: dr Sharma Covert  CHIEF COMPLAINT:   Confusion HISTORY OF PRESENT ILLNESS:  [name]  is a 66 y.o. male with a known history of Quadriplegia after a fall 7 years ago, diabetes and CVA who presents with his wife due to altered mental status/confusion. Wife reports over the past few days patient has had increasing confusion and decreased by mouth intake. She changes suprapubic catheter last week which she usually does on a weekly basis. She states over the past few days it has been dark in color.   PAST MEDICAL HISTORY:   Past Medical History:  Diagnosis Date  . Allergy   . Anxiety   . CAD (coronary artery disease)   . Diabetes mellitus without complication (HCC)   . GERD (gastroesophageal reflux disease)   . Hyperlipidemia   . Quadriplegia (HCC)   . Seizures (HCC)   . Spinal cord disease (HCC)   . Stroke [facility])     PAST SURGICAL HISTORY:   Past Surgical History:  Procedure Laterality Date  . ENDARTERECTOMY    . ENDARTERECTOMY    . TRACHEOSTOMY      SOCIAL HISTORY:   Social History  Substance Use Topics  . Smoking status: Never Smoker  . Smokeless tobacco: Never Used  . Alcohol use No    FAMILY HISTORY:   Family History  Problem Relation Age of Onset  . Stroke Father     DRUG ALLERGIES:  No Known Allergies  REVIEW OF SYSTEMS:   Review of Systems  Unable to perform ROS: Acuity of condition  Gastrointestinal: Positive for constipation.    MEDICATIONS AT HOME:   Prior to Admission medications   Medication Sig Start Date End Date Taking? Authorizing Provider  atorvastatin (LIPITOR) 10 MG tablet Take 1 tablet (10 mg total) by mouth daily. 05/15/16  Yes Duanne Limerick, MD  baclofen (LIORESAL) 20 MG tablet Take 1 tablet  by mouth 2 (two) times daily. 04/26/16  Yes Historical Provider, MD  citalopram (CELEXA) 20 MG tablet TAKE 1 TABLET (20 MG TOTAL) BY MOUTH DAILY. 05/23/16  Yes Duanne Limerick, MD  clopidogrel (PLAVIX) 75 MG tablet Take 1 tablet (75 mg total) by mouth daily. 05/15/16  Yes Duanne Limerick, MD  diazepam (VALIUM) 5 MG tablet Take 5-10 mg by mouth 3 (three) times daily as needed for muscle spasms. Take 5 mg in the morning, 5 mg at noon and 10 mg every night at bedtime as needed for spasms. 03/02/15  Yes Historical Provider, MD  furosemide (LASIX) 20 MG tablet Take 1 tablet (20 mg total) by mouth daily. 05/15/16  Yes Duanne Limerick, MD  levETIRAcetam (KEPPRA) 500 MG tablet Take 1 tablet (500 mg total) by mouth 2 (two) times daily. Patient taking differently: Take 500 mg by mouth 3 (three) times daily.  05/18/15  Yes Duanne Limerick, MD  metFORMIN (GLUCOPHAGE) 500 MG tablet Take 1 tablet (500 mg total) by mouth 2 (two) times daily with a meal. 05/15/16  Yes Duanne Limerick, MD  mirtazapine (REMERON) 15 MG tablet Take 1 tablet by mouth daily. Dr. Carlena Hurl 04/16/16  Yes Historical Provider, MD  oxybutynin (DITROPAN) 5 MG tablet Take 1 tablet by mouth 3  times daily Patient taking differently: Take 1 tablet  by mouth 3  times daily Urology Doctor- [facility] 06/08/15  Yes Duanne Limerick, MD  pantoprazole (PROTONIX) 40 MG tablet Take 1 tablet (40 mg total) by mouth daily. 05/15/16  Yes Duanne Limerick, MD  sodium chloride irrigation 0.9 % irrigation Irrigate with 1 application as directed at bedtime. Use to flush tube once daily at night. 05/20/16  Yes Historical Provider, MD  metFORMIN (GLUCOPHAGE) 500 MG tablet TAKE 1 TABLET (500 MG TOTAL) BY MOUTH 2 (TWO) TIMES DAILY WITH A MEAL. Patient not taking: Reported on 06/09/2016 05/28/16   Duanne Limerick, MD      VITAL SIGNS:  Blood pressure 136/66, pulse 95, temperature 97.7 F (36.5 C), temperature source Oral, resp. rate 18, weight 83.9 kg (185 lb), SpO2 97 %.  PHYSICAL EXAMINATION:    Physical Exam  Constitutional: He is well-developed, well-nourished, and in no distress. No distress.  HENT:  Head: Normocephalic.  Eyes: No scleral icterus.  Neck: Neck supple. No JVD present. No tracheal deviation present.  Cardiovascular: Normal rate, regular rhythm and normal heart sounds.  Exam reveals no gallop and no friction rub.   No murmur heard. Pulmonary/Chest: Effort normal and breath sounds normal. No respiratory distress. He has no wheezes. He has no rales. He exhibits no tenderness.  Abdominal: Soft. Bowel sounds are normal. He exhibits no distension and no mass. There is no tenderness. There is no rebound and no guarding.  Musculoskeletal: He exhibits no edema.  Patient is a quadriplegic.  Neurological: He is alert.  Patient is confused and not oriented to place or time  Skin: Skin is warm. No rash noted. No erythema.      LABORATORY PANEL:   CBC  Recent Labs Lab 06/09/16 1345  WBC 11.3*  HGB 10.7*  HCT 31.9*  PLT 191   ------------------------------------------------------------------------------------------------------------------  Chemistries   Recent Labs Lab 06/09/16 1345  NA 131*  K 4.3  CL 103  CO2 19*  GLUCOSE 171*  BUN 21*  CREATININE 1.22  CALCIUM 8.8*  AST 39  ALT 27  ALKPHOS 141*  BILITOT 0.6   ------------------------------------------------------------------------------------------------------------------  Cardiac Enzymes  Recent Labs Lab 06/09/16 1345  TROPONINI <0.03   ------------------------------------------------------------------------------------------------------------------  RADIOLOGY:  Dg Chest Portable 1 View  Result Date: 06/09/2016 CLINICAL DATA:  Altered mental status and confusion. EXAM: PORTABLE CHEST 1 VIEW COMPARISON:  05/12/2015 and prior exams FINDINGS: The cardiomediastinal silhouette is unremarkable. There is no evidence of focal airspace disease, pulmonary edema, suspicious pulmonary  nodule/mass, pleural effusion, or pneumothorax. No acute bony abnormalities are identified. IMPRESSION: No active disease. Electronically Signed   By: Harmon Pier M.D.   On: 06/09/2016 14:43    EKG:  Normal sinus rhythm no ST elevation or depression   IMPRESSION AND PLAN:   66 year old male with quadriplegia, diabetes and history of CVA who presents with acute metabolic encephalopathy.  1. Acute metabolic encephalopathy in the setting of poor by mouth intake with hyponatremia and UTI Check head CT.  2. Urinary tract infection: Continue Rocephin and follow-up on final urine culture.  3. Hyponatremia from poor by mouth intake: Start IV fluids and repeat BMP in a.m.  4. Diabetes: Patient currently not on home medications. ADA diet with sliding scale insulin  5. History of seizures: Continue Keppra  6. Paraplegia: Continued Ditropan, Valium, baclofen 7. History of CVA: Continue Plavix and atorvastatin,  All the records are reviewed and case discussed with ED provider. Management plans discussed with the patient's wife and she is in agreement  CODE STATUS: DNR  TOTAL TIME TAKING CARE OF THIS PATIENT: 45 minutes.    Averlee Swartz M.D on 06/09/2016 at 3:11 PM  Between 7am to 6pm - Pager - [phone]  After 6pm go to [email or web address] - Social research officer, government  [facility] [place] [facility]  Office  [phone]  CC: Primary care physician; Elizabeth Sauer, MD

## 2016-06-09 NOTE — ED Notes (Signed)
Family okay with obtaining urine sample from suprapubic catheter site, reporting, "I changed the catheter Tuesday."

## 2016-06-09 NOTE — ED Provider Notes (Signed)
[facility] Emergency Department Provider Note  ____________________________________________  Time seen: Approximately 1:44 PM  I have reviewed the triage vital signs and the nursing notes.   HISTORY  Chief Complaint Altered Mental Status    HPI [name] is a 66 y.o. male with a history of quadriplegia, recurrent UTI and suprapubic indwelling catheter, CAD, HTN, DM presenting with altered mental status. The patient and his wife describe that for the past several days he's been having some confusion and difficulty using his wheelchair. His wife changed suprapubic catheter on Tuesday, and the output has intermittently been dark. The patient has a well healing R scapular chronic wound.  The patient has not been having any fever, chills, nausea or vomiting, diarrhea. He has been eating and drinking normally.    Past Medical History:  Diagnosis Date  . Allergy   . Anxiety   . CAD (coronary artery disease)   . Diabetes mellitus without complication (HCC)   . GERD (gastroesophageal reflux disease)   . Hyperlipidemia   . Quadriplegia (HCC)   . Seizures (HCC)   . Spinal cord disease (HCC)   . Stroke [facility])     Patient Active Problem List   Diagnosis Date Noted  . Carotid stenosis 03/20/2016  . GERD (gastroesophageal reflux disease) 05/13/2015  . CAD (coronary artery disease) 05/13/2015  . Anxiety 05/13/2015  . Quadriplegia (HCC) 05/13/2015  . UTI (lower urinary tract infection) 05/13/2015  . Adjustment disorder with depressed mood 02/23/2015  . Insomnia 02/23/2015  . Screening for diabetes mellitus 08/31/2014  . 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 Hemiplegia (HCC) 10/01/2012  . HLD (hyperlipidemia) 10/01/2012  . Seizure (HCC) 10/01/2012  . Concussion and swelling of spinal cord 10/01/2012    Past Surgical History:  Procedure Laterality Date  . ENDARTERECTOMY    . ENDARTERECTOMY    . TRACHEOSTOMY      Current Outpatient Rx   . Order #: [id number] Class: Normal  . Order #: [id number] Class: Historical Med  . Order #: [id number] Class: Normal  . Order #: [id number] Class: Normal  . Order #: [id number] Class: Historical Med  . Order #: [id number] Class: Normal  . Order #: [id number] Class: Normal  . Order #: [id number] Class: Normal  . Order #: [id number] Class: Normal  . Order #: [id number] Class: Historical Med  . Order #: [id number] Class: Normal  . Order #: [id number] Class: Normal  . Order #: [id number] Class: Historical Med    Allergies Patient has no known allergies.  Family History  Problem Relation Age of Onset  . Stroke Father     Social History Social History  Substance Use Topics  . Smoking status: Never Smoker  . Smokeless tobacco: Never Used  . Alcohol use No    Review of Systems Constitutional: No fever/chills.Positive altered mental status.  Eyes: No visual changes. ENT: No sore throat. No congestion or rhinorrhea. no ear pain.  Cardiovascular: Denies chest pain. Denies palpitations. Respiratory: Denies shortness of breath.  No cough. Gastrointestinal: No abdominal pain.  No nausea, no vomiting.  No diarrhea.  No constipation. Genitourinary: suprapubic catheter, indwelling. Dark urine output.  Musculoskeletal: Negative for back pain. Skin: Negative for rash. Positive nonhealing right scapular wound. Neurological: Negative for headaches. No focal numbness, tingling or weakness.   10-point ROS otherwise negative.  ____________________________________________   PHYSICAL EXAM:  VITAL SIGNS: ED Triage Vitals  Enc Vitals Group     BP 06/09/16 1329 136/66     Pulse Rate 06/09/16 1329 95     Resp 06/09/16 1329  18     Temp 06/09/16 1329 97.7 F (36.5 C)     Temp Source 06/09/16 1329 Oral     SpO2 06/09/16 1329 97 %     Weight 06/09/16 1329 185 lb (83.9 kg)     Height --      Head Circumference --      Peak Flow --      Pain Score --      Pain Loc --      Pain Edu? --      Excl. in GC? --      Constitutional: Alert and orientedChronically ill appearing butin no acute distress. Answers questions appropriately. Eyes: Conjunctivae are normal.  EOMI. No scleral icterus. Head: Atraumatic. Nose: No congestion/rhinnorhea. Mouth/Throat: Mucous membranes are moist.  Neck: No stridor.  Cardiovascular: Normal rate, regular rhythm. No murmurs, rubs or gallops.  Respiratory: Normal respiratory effort.  No accessory muscle use or retractions. Lungs CTAB.  No wheezes, rales or ronchi. Gastrointestinal: Soft, nontender anmildly distended.  No guarding or rebound.  No peritoneal signs. Genitourinary: Suprapubic catheter in place with yellow urine output that has some minimal sediment Musculoskeletal: No LE edema. Positive well-healing chronic right scapular wound that is approximately 2 x 2 cm without any surrounding erythema, or purulent drainage, no fluctuance. Neurologic:  A&Ox3.  Speech is clear.  Face and smile are symmetric.  EOMI.  Quadriplegic with associated upper extremity contractures.  Skin:  Skin is warm, dry and intact. Marland Kitchen Psychiatric: Mood and affect are normal. Speech and behavior are normal.    ____________________________________________   LABS (all labs ordered are listed, but only abnormal results are displayed)  Labs Reviewed  URINALYSIS, COMPLETE (UACMP) WITH MICROSCOPIC - Abnormal; Notable for the following:       Result Value   Color, Urine AMBER (*)    APPearance TURBID (*)    Hgb urine dipstick SMALL (*)    Protein, ur 100 (*)    Nitrite POSITIVE (*)    Leukocytes, UA LARGE (*)    Bacteria, UA MANY (*)    Squamous Epithelial / LPF 6-30 (*)    All other components within normal limits  CBC - Abnormal; Notable for the following:    WBC 11.3 (*)    RBC 3.86 (*)    Hemoglobin 10.7 (*)    HCT 31.9 (*)    RDW 18.3 (*)    All other components within normal limits  COMPREHENSIVE METABOLIC PANEL - Abnormal; Notable for the following:    Sodium 131 (*)    CO2  19 (*)    Glucose, Bld 171 (*)    BUN 21 (*)    Calcium 8.8 (*)    Albumin 3.2 (*)    Alkaline Phosphatase 141 (*)    All other components within normal limits  URINE CULTURE  TROPONIN I   ____________________________________________  EKG  ED ECG REPORT I, Rockne Menghini, the attending physician, personally viewed and interpreted this ECG.   Date: 06/09/2016  EKG Time: 1334  Rate: 92  Rhythm: normal sinus rhythm  Axis: normal  Intervals:none  ST&T Change: No STEMI  Poor baseline tracing.  ____________________________________________  RADIOLOGY  No results found.  ____________________________________________   PROCEDURES  Procedure(s) performed: None  Procedures  Critical Care performed: No ____________________________________________   INITIAL IMPRESSION / ASSESSMENT AND PLAN / ED COURSE  Pertinent labs & imaging results that were available during my care of the patient were reviewed by me and considered in my medical decision  making (see chart for details).  66 y.o. male, quadriplegic with indwelling suprapubic catheter, presenting with altered mental status. The patient has no other acute findings on my examination. We'll check for UTI, get basic labs, and likely plan admission today.   ----------------------------------------- 2:34 PM on 06/09/2016 -----------------------------------------  The patient continues to be hemodynamically stable. His urine is positive for infection and I'll treat him with Rocephin based on his last microbiology reports. He has mild hyponatremia but his creatinine is within normal limits. Plan admission at this time.  ____________________________________________  FINAL CLINICAL IMPRESSION(S) / ED DIAGNOSES  Final diagnoses:  Hyponatremia  Disorientation  Acute cystitis without hematuria         NEW MEDICATIONS STARTED DURING THIS VISIT:  New Prescriptions   No medications on file      Rockne Menghini, MD 06/09/16 1435

## 2016-06-10 LAB — CBC
HEMATOCRIT: 27.9 % — AB (ref 40.0–52.0)
Hemoglobin: 9.5 g/dL — ABNORMAL LOW (ref 13.0–18.0)
MCH: 28.7 pg (ref 26.0–34.0)
MCHC: 33.9 g/dL (ref 32.0–36.0)
MCV: 84.6 fL (ref 80.0–100.0)
Platelets: 158 10*3/uL (ref 150–440)
RBC: 3.3 MIL/uL — ABNORMAL LOW (ref 4.40–5.90)
RDW: 18.7 % — AB (ref 11.5–14.5)
WBC: 6.6 10*3/uL (ref 3.8–10.6)

## 2016-06-10 LAB — BASIC METABOLIC PANEL
Anion gap: 5 (ref 5–15)
BUN: 17 mg/dL (ref 6–20)
CHLORIDE: 112 mmol/L — AB (ref 101–111)
CO2: 22 mmol/L (ref 22–32)
Calcium: 8.4 mg/dL — ABNORMAL LOW (ref 8.9–10.3)
Creatinine, Ser: 1 mg/dL (ref 0.61–1.24)
GFR calc Af Amer: >60 mL/min (ref >60)
GFR calc non Af Amer: >60 mL/min (ref >60)
Glucose, Bld: 94 mg/dL (ref 65–99)
POTASSIUM: 4.3 mmol/L (ref 3.5–5.1)
SODIUM: 139 mmol/L (ref 135–145)

## 2016-06-10 LAB — GLUCOSE, CAPILLARY
GLUCOSE-CAPILLARY: 66 mg/dL (ref 65–99)
GLUCOSE-CAPILLARY: 97 mg/dL (ref 65–99)
Glucose-Capillary: 81 mg/dL (ref 65–99)
Glucose-Capillary: 154 mg/dL — ABNORMAL HIGH (ref 65–99)

## 2016-06-10 LAB — URINE CULTURE

## 2016-06-10 MED ORDER — CEFUROXIME AXETIL 250 MG PO TABS: 250.0000 mg | ORAL_TABLET | Freq: Two times a day (BID) | ORAL | 0 refills | Status: AC

## 2016-06-10 MED ORDER — DEXTROSE 5 % IV SOLN
1.0000 g | INTRAVENOUS | Status: DC
  Administered 2016-06-10: 1 g via INTRAVENOUS
  Filled 2016-06-10 (×2): qty 10

## 2016-06-10 NOTE — Consult Note (Signed)
WOC Nurse wound consult note Reason for Consult: right scapular wound, Stage 3 pressure injury, healing.  Followed by the outpatient wound care center, Dr. Leanord Hawking.  Also noted is a LLE lateral aspect bruise. Unable to determine if this is long-standing or recent.  Patient crosses his LEs chronically, I will implement Prevalon Boots for PrI prevention that will also correct for corssing of LEs and lateral (or medial) rotation. Wound type: Pressure Pressure Injury POA: Yes Measurement: 2cm x 2cm x 0.2cm.  Bruise on LLE (lateral aspect) measures 10cm x 5cm. Wound bed: Pink, moist  Drainage (amount, consistency, odor) small serous, no odor Periwound:intact with evidence of previous wound healing Dressing procedure/placement/frequency: Patient uses hydrofera blue at the outpatient [facility], we do not have access to that antimicrobial here in house, so I shall select another from the same category. Once daily dressing using topical silver hydrofiber (Aquacel Ag+) is ordered after cleansing with NS and gently patting dry. Prevalon Boots are provided (see above). WOC nursing team will not follow, but will remain available to this patient, the nursing and medical teams.  Please re-consult if needed. Thanks, Ladona Mow, MSN, RN, GNP, [facility]  Pager# [phone]

## 2016-06-10 NOTE — Progress Notes (Signed)
[name] to be D/C'd Home per MD order.  Discussed with the patient and all questions fully answered.  VSS, Skin clean, dry and intact without evidence of skin break down, no evidence of skin tears noted. IV catheter discontinued intact. Site without signs and symptoms of complications. Dressing and pressure applied.  An After Visit Summary was printed and given to the patient. Patient received prescription.  D/c education completed with patient/family including follow up instructions, medication list, d/c activities limitations if indicated, with other d/c instructions as indicated by MD - patient able to verbalize understanding, all questions fully answered.   Patient instructed to return to ED, call 911, or call MD for any changes in condition.   Patient escorted via WC, and D/C home via private auto.  Harvie Heck 06/10/2016 5:44 PM

## 2016-06-10 NOTE — Discharge Summary (Signed)
[name] [place] [facility]   PATIENT NAME: [name]    MR#:  [id number]  DATE OF BIRTH:  01-23-1951  DATE OF ADMISSION:  06/09/2016 ADMITTING PHYSICIAN: Adrian Saran, MD  DATE OF DISCHARGE: 06/10/2016  PRIMARY CARE PHYSICIAN: Elizabeth Sauer, MD    ADMISSION DIAGNOSIS:  Confusion [R41.0] Hyponatremia [E87.1] Disorientation [R41.0] Acute cystitis without hematuria [N30.00]  DISCHARGE DIAGNOSIS:  Active Problems:   Confusion    UTI SECONDARY DIAGNOSIS:   Past Medical History:  Diagnosis Date  . Allergy   . Anxiety   . CAD (coronary artery disease)   . Diabetes mellitus without complication (HCC)   . GERD (gastroesophageal reflux disease)   . Hyperlipidemia   . Quadriplegia (HCC)   . Seizures (HCC)   . Spinal cord disease (HCC)   . Stroke [facility])     HOSPITAL COURSE:   1. Acute metabolic encephalopathy in the setting of poor by mouth intake with hyponatremia and UTI Checked head CT.  After receiving some IV fluid and the IV ceftriaxone for 1 dose patient was completely back to baseline next day.  2. Urinary tract infection: Continue Rocephin and follow-up on final urine culture.   As patient returned completely back to his baseline, his wife insisted to take him home and finish the antibiotic course at home, which sounds reasonable as patient has chronic Foley catheter and he is now appearing at his baseline, they can finish the course of total 7 days of oral antibiotic and follow up with primary care physician.  3. Hyponatremia from poor by mouth intake: Start IV fluids and repeat BMP in a.m. improved.   4. Diabetes: Patient currently not on home medications. ADA diet with sliding scale insulin  5. History of seizures: Continue Keppra  6. Paraplegia: Continued Ditropan, Valium, baclofen 7. History of CVA: Continue Plavix and atorvastatin,  DISCHARGE CONDITIONS:   Stable.  CONSULTS OBTAINED:    DRUG ALLERGIES:  No  Known Allergies  DISCHARGE MEDICATIONS:   Current Discharge Medication List    START taking these medications   Details  cefUROXime (CEFTIN) 250 MG tablet Take 1 tablet (250 mg total) by mouth 2 (two) times daily with a meal. Qty: 10 tablet, Refills: 0      CONTINUE these medications which have NOT CHANGED   Details  atorvastatin (LIPITOR) 10 MG tablet Take 1 tablet (10 mg total) by mouth daily. Qty: 90 tablet, Refills: 1   Associated Diagnoses: Hyperlipidemia, unspecified hyperlipidemia type    baclofen (LIORESAL) 20 MG tablet Take 1 tablet by mouth 2 (two) times daily.    citalopram (CELEXA) 20 MG tablet TAKE 1 TABLET (20 MG TOTAL) BY MOUTH DAILY. Qty: 30 tablet, Refills: 2    clopidogrel (PLAVIX) 75 MG tablet Take 1 tablet (75 mg total) by mouth daily. Qty: 90 tablet, Refills: 1   Associated Diagnoses: Quadriplegia (HCC)    diazepam (VALIUM) 5 MG tablet Take 5-10 mg by mouth 3 (three) times daily as needed for muscle spasms. Take 5 mg in the morning, 5 mg at noon and 10 mg every night at bedtime as needed for spasms.    furosemide (LASIX) 20 MG tablet Take 1 tablet (20 mg total) by mouth daily. Qty: 90 tablet, Refills: 1   Associated Diagnoses: Localized edema    levETIRAcetam (KEPPRA) 500 MG tablet Take 1 tablet (500 mg total) by mouth 2 (two) times daily. Qty: 180 tablet, Refills: 0   Associated Diagnoses: Seizure disorder (HCC)    !!  metFORMIN (GLUCOPHAGE) 500 MG tablet Take 1 tablet (500 mg total) by mouth 2 (two) times daily with a meal. Qty: 180 tablet, Refills: 3   Associated Diagnoses: Hyperglycemia; Prediabetes    mirtazapine (REMERON) 15 MG tablet Take 1 tablet by mouth daily. Dr. Carlena Hurl    oxybutynin (DITROPAN) 5 MG tablet Take 1 tablet by mouth 3  times daily Qty: 270 tablet, Refills: 0    pantoprazole (PROTONIX) 40 MG tablet Take 1 tablet (40 mg total) by mouth daily. Qty: 90 tablet, Refills: 1   Associated Diagnoses: Gastroesophageal reflux disease,  esophagitis presence not specified    sodium chloride irrigation 0.9 % irrigation Irrigate with 1 application as directed at bedtime. Use to flush tube once daily at night.    !! metFORMIN (GLUCOPHAGE) 500 MG tablet TAKE 1 TABLET (500 MG TOTAL) BY MOUTH 2 (TWO) TIMES DAILY WITH A MEAL. Qty: 180 tablet, Refills: 1   Associated Diagnoses: Hyperglycemia     !! - Potential duplicate medications found. Please discuss with provider.       DISCHARGE INSTRUCTIONS:    Follow with primary care physician next week.   If you experience worsening of your admission symptoms, develop shortness of breath, life threatening emergency, suicidal or homicidal thoughts you must seek medical attention immediately by calling 911 or calling your MD immediately  if symptoms less severe.  You Must read complete instructions/literature along with all the possible adverse reactions/side effects for all the Medicines you take and that have been prescribed to you. Take any new Medicines after you have completely understood and accept all the possible adverse reactions/side effects.   Please note  You were cared for by a hospitalist during your hospital stay. If you have any questions about your discharge medications or the care you received while you were in the hospital after you are discharged, you can call the unit and asked to speak with the hospitalist on call if the hospitalist that took care of you is not available. Once you are discharged, your primary care physician will handle any further medical issues. Please note that NO REFILLS for any discharge medications will be authorized once you are discharged, as it is imperative that you return to your primary care physician (or establish a relationship with a primary care physician if you do not have one) for your aftercare needs so that they can reassess your need for medications and monitor your lab values.    Today   CHIEF COMPLAINT:   Chief Complaint   Patient presents with  . Altered Mental Status    HISTORY OF PRESENT ILLNESS:  [name]  is a 66 y.o. male with a known history of Quadriplegia after a fall 7 years ago, diabetes and CVA who presents with his wife due to altered mental status/confusion. Wife reports over the past few days patient has had increasing confusion and decreased by mouth intake. She changes suprapubic catheter last week which she usually does on a weekly basis. She states over the past few days it has been dark in color.  VITAL SIGNS:  Blood pressure 126/66, pulse 67, temperature 98.6 F (37 C), temperature source Axillary, resp. rate 20, height  (1.753 m), weight 83.9 kg (185 lb), SpO2 98 %.  I/O:   Intake/Output Summary (Last 24 hours) at 06/10/16 1502 Last data filed at 06/10/16 1341  Gross per 24 hour  Intake          2043.33 ml  Output  3300 ml  Net         -1256.67 ml    PHYSICAL EXAMINATION:  GENERAL:  66 y.o.-year-old patient lying in the bed with no acute distress.  EYES: Pupils equal, round, reactive to light and accommodation. No scleral icterus. Extraocular muscles intact.  HEENT: Head atraumatic, normocephalic. Oropharynx and nasopharynx clear.  NECK:  Supple, no jugular venous distention. No thyroid enlargement, no tenderness.  LUNGS: Normal breath sounds bilaterally, no wheezing, rales,rhonchi or crepitation. No use of accessory muscles of respiration.  CARDIOVASCULAR: S1, S2 normal. No murmurs, rubs, or gallops.  ABDOMEN: Soft, non-tender, non-distended. Bowel sounds present. No organomegaly or mass. Suprapubic catheter in place.  EXTREMITIES: No pedal edema, cyanosis, or clubbing.  NEUROLOGIC: Cranial nerves II through XII are intact.Quadriplegia. PSYCHIATRIC: The patient is alert and oriented x 3.  SKIN: No obvious rash, lesion, or ulcer.   DATA REVIEW:   CBC  Recent Labs Lab 06/10/16 0347  WBC 6.6  HGB 9.5*  HCT 27.9*  PLT 158    Chemistries    Recent Labs Lab 06/09/16 1345 06/10/16 0347  NA 131* 139  K 4.3 4.3  CL 103 112*  CO2 19* 22  GLUCOSE 171* 94  BUN 21* 17  CREATININE 1.22 1.00  CALCIUM 8.8* 8.4*  AST 39  --   ALT 27  --   ALKPHOS 141*  --   BILITOT 0.6  --     Cardiac Enzymes  Recent Labs Lab 06/09/16 1345  TROPONINI <0.03    Microbiology Results  Results for orders placed or performed during the hospital encounter of 06/09/16  Urine culture     Status: Abnormal   Collection Time: 06/09/16  1:44 PM  Result Value Ref Range Status   Specimen Description URINE, SUPRAPUBIC  Final   Special Requests NONE  Final   Culture MULTIPLE SPECIES PRESENT, SUGGEST RECOLLECTION (A)  Final   Report Status 06/10/2016 FINAL  Final    RADIOLOGY:  Ct Head Wo Contrast  Result Date: 06/09/2016 CLINICAL DATA:  Altered mental status, confusion EXAM: CT HEAD WITHOUT CONTRAST TECHNIQUE: Contiguous axial images were obtained from the base of the skull through the vertex without intravenous contrast. COMPARISON:  05/12/2015 FINDINGS: Brain: No intracranial hemorrhage, mass effect or midline shift. No acute cortical infarction. Stable remote infarct left MCA territory. No mass lesion is noted on this unenhanced scan. Mild cerebral atrophy is stable. Stable mild periventricular chronic white matter disease. Vascular: Atherosclerotic calcifications of carotid siphon Skull: No skull fracture is noted. Sinuses/Orbits: There is significant mucosal thickening with almost complete opacification left maxillary sinus. There is mucosal thickening with complete opacification of left frontal sinus. The mastoid air cells are unremarkable. Other: None IMPRESSION: No acute intracranial abnormality. Stable atrophy and chronic white matter disease. 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 Mucosal thickening with almost complete opacification left maxillary sinus. Mucosal thickening with complete opacification left frontal sinus. Electronically Signed    By: Natasha Mead M.D.   On: 06/09/2016 17:03   Dg Chest Portable 1 View  Result Date: 06/09/2016 CLINICAL DATA:  Altered mental status and confusion. EXAM: PORTABLE CHEST 1 VIEW COMPARISON:  05/12/2015 and prior exams FINDINGS: The cardiomediastinal silhouette is unremarkable. There is no evidence of focal airspace disease, pulmonary edema, suspicious pulmonary nodule/mass, pleural effusion, or pneumothorax. No acute bony abnormalities are identified. IMPRESSION: No active disease. Electronically Signed   By: Harmon Pier M.D.   On: 06/09/2016 14:43    EKG:   Orders placed or  performed during the hospital encounter of 06/09/16  . EKG 12-Lead  . EKG 12-Lead      Management plans discussed with the patient, family and they are in agreement.  CODE STATUS:     Code Status Orders        Start     Ordered   06/09/16 1621  Full code  Continuous     06/09/16 1620    Code Status History    Date Active Date Inactive Code Status Order ID Comments User Context   05/13/2015  4:12 AM 05/13/2015  7:29 PM Full Code [id number]  Oralia Manis, MD Inpatient      TOTAL TIME TAKING CARE OF THIS PATIENT: 35 minutes.    Altamese Dilling M.D on 06/10/2016 at 3:02 PM  Between 7am to 6pm - Pager - [phone]  After 6pm go to [email or web address] - password [email or web address]  [facility]  Office  [phone]  CC: Primary care physician; Elizabeth Sauer, MD   Note: This dictation was prepared with Dragon dictation along with smaller phrase technology. Any transcriptional errors that result from this process are unintentional.

## 2016-06-10 NOTE — Plan of Care (Signed)
Problem: Urinary Elimination: Goal: Signs and symptoms of infection will decrease Outcome: Progressing Patient not complaining of any abdominal discomfort. Patient having adequate output.   Harvie Heck, RN

## 2016-06-12 ENCOUNTER — Other Ambulatory Visit: Payer: Self-pay

## 2016-06-19 ENCOUNTER — Ambulatory Visit: Payer: Medicare Other | Admitting: Internal Medicine

## 2016-06-26 ENCOUNTER — Other Ambulatory Visit: Payer: Self-pay | Admitting: Family Medicine

## 2016-06-26 ENCOUNTER — Encounter: Payer: Medicare Other | Attending: Internal Medicine | Admitting: Internal Medicine

## 2016-06-26 DIAGNOSIS — G8252 Quadriplegia, C1-C4 incomplete: Secondary | ICD-10-CM | POA: Diagnosis not present

## 2016-06-26 DIAGNOSIS — L89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, stage 3: Secondary | ICD-10-CM | POA: Diagnosis not present

## 2016-06-26 DIAGNOSIS — E1165 Type 2 diabetes mellitus with hyperglycemia: Secondary | ICD-10-CM | POA: Insufficient documentation

## 2016-06-26 DIAGNOSIS — E785 Hyperlipidemia, unspecified: Secondary | ICD-10-CM

## 2016-06-26 DIAGNOSIS — Z7984 Long term (current) use of oral hypoglycemic drugs: Secondary | ICD-10-CM | POA: Diagnosis not present

## 2016-06-26 DIAGNOSIS — F329 Major depressive disorder, single episode, unspecified: Secondary | ICD-10-CM | POA: Diagnosis not present

## 2016-06-26 DIAGNOSIS — K219 Gastro-esophageal reflux disease without esophagitis: Secondary | ICD-10-CM | POA: Diagnosis not present

## 2016-06-26 DIAGNOSIS — E11621 Type 2 diabetes mellitus with foot ulcer: Secondary | ICD-10-CM | POA: Diagnosis not present

## 2016-06-26 DIAGNOSIS — I959 Hypotension, unspecified: Secondary | ICD-10-CM | POA: Insufficient documentation

## 2016-06-26 DIAGNOSIS — Z8673 Personal history of transient ischemic attack (TIA), and cerebral infarction without residual deficits: Secondary | ICD-10-CM | POA: Diagnosis not present

## 2016-06-26 DIAGNOSIS — R569 Unspecified convulsions: Secondary | ICD-10-CM | POA: Insufficient documentation

## 2016-06-26 DIAGNOSIS — L89622 Pressure ulcer of left heel, stage 2: Secondary | ICD-10-CM | POA: Insufficient documentation

## 2016-06-26 DIAGNOSIS — X58XXXS Exposure to other specified factors, sequela: Secondary | ICD-10-CM | POA: Insufficient documentation

## 2016-06-26 DIAGNOSIS — S14103S Unspecified injury at C3 level of cervical spinal cord, sequela: Secondary | ICD-10-CM | POA: Diagnosis not present

## 2016-06-26 DIAGNOSIS — G8250 Quadriplegia, unspecified: Secondary | ICD-10-CM

## 2016-06-26 DIAGNOSIS — L97421 Non-pressure chronic ulcer of left heel and midfoot limited to breakdown of skin: Secondary | ICD-10-CM | POA: Diagnosis not present

## 2016-06-28 DIAGNOSIS — Z9181 History of falling: Secondary | ICD-10-CM | POA: Diagnosis not present

## 2016-06-28 DIAGNOSIS — G8250 Quadriplegia, unspecified: Secondary | ICD-10-CM | POA: Diagnosis not present

## 2016-06-28 DIAGNOSIS — L89113 Pressure ulcer of right upper back, stage 3: Secondary | ICD-10-CM | POA: Diagnosis not present

## 2016-06-28 DIAGNOSIS — L218 Other seborrheic dermatitis: Secondary | ICD-10-CM | POA: Diagnosis not present

## 2016-06-28 DIAGNOSIS — Z993 Dependence on wheelchair: Secondary | ICD-10-CM | POA: Diagnosis not present

## 2016-06-28 DIAGNOSIS — Z466 Encounter for fitting and adjustment of urinary device: Secondary | ICD-10-CM | POA: Diagnosis not present

## 2016-06-28 DIAGNOSIS — L89623 Pressure ulcer of left heel, stage 3: Secondary | ICD-10-CM | POA: Diagnosis not present

## 2016-06-28 NOTE — Progress Notes (Signed)
TREASURE, INGRUM ([id number]) Visit Report for 06/26/2016 Arrival Information Details Patient Name: [name], [name]. Date of Service: 06/26/2016 3:00 PM Medical Record Number: [id number] Patient Account Number: [id number] Date of Birth/Sex: [birth year] (66 y.o. Male) Treating RN: Baruch Gouty, RN, BSN, Velva Harman Primary Care Larisha Vencill: Otilio Miu Other Clinician: Referring Pratik Dalziel: Otilio Miu Treating Alverto Shedd/Extender: Tito Dine in Treatment: 74 Visit Information History Since Last Visit All ordered tests and consults were completed: No Patient Arrived: Wheel Chair Added or deleted any medications: No Arrival Time: 13:55 Any new allergies or adverse reactions: No Accompanied By: wife Had a fall or experienced change in No activities of daily living that may affect Transfer Assistance: None risk of falls: Patient Identification Verified: Yes Signs or symptoms of abuse/neglect since last No Secondary Verification Process Yes visito Completed: Hospitalized since last visit: No Patient Requires Transmission-Based No Has Dressing in Place as Prescribed: Yes Precautions: Pain Present Now: No Patient Has Alerts: Yes Patient Alerts: DM II Plavix Electronic Signature(s) Signed: 06/26/2016 4:25:14 PM By: Regan Lemming BSN, RN Entered By: Regan Lemming on 06/26/2016 13:55:33 [name] ([id number]) -------------------------------------------------------------------------------- Encounter Discharge Information Details Patient Name: [name] Coast C. Date of Service: 06/26/2016 3:00 PM Medical Record Number: [id number] Patient Account Number: [id number] Date of Birth/Sex: Jul 09, 1950 (66 y.o. Male) Treating RN: Baruch Gouty, RN, BSN, Velva Harman Primary Care Oseias Horsey: Otilio Miu Other Clinician: Referring Hadiya Spoerl: Otilio Miu Treating Arron Tetrault/Extender: Tito Dine in Treatment: 22 Encounter Discharge Information Items Schedule Follow-up Appointment: No Medication  Reconciliation completed No and provided to Patient/Care [name]: Provided on Clinical Summary of Care: 06/26/2016 Form Type Recipient Paper Patient [facility] Electronic Signature(s) Signed: 06/26/2016 2:29:14 PM By: Ruthine Dose Entered By: Ruthine Dose on 06/26/2016 14:29:14 [name] ([id number]) -------------------------------------------------------------------------------- Multi Wound Chart Details Patient Name: [name] Coast C. Date of Service: 06/26/2016 3:00 PM Medical Record Number: [id number] Patient Account Number: [id number] Date of Birth/Sex: 1950/12/01 (66 y.o. 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] [N/A:N/A] Comorbid History: [1:Coronary Artery Disease, Coronary Artery Disease, Type II Diabetes, Quadriplegia, Seizure Disorder] [2:Type II Diabetes, Quadriplegia, Seizure Disorder] [N/A:N/A] Date Acquired: [1:11/24/2015] [2:06/12/2016] [N/A:N/A] Weeks of Treatment: [1:22] [2:0] [N/A:N/A] Wound Status: [1:Open] [2:Open] [N/A:N/A] Measurements L x W x D 2.4x1.8x0.1 [2:6.5x6.5x0.1] [N/A:N/A] (cm) Area (cm) : [1:3.393] [2:33.183] [N/A:N/A] Volume (cm) : [1:0.339] [2:3.318] [N/A:N/A] % Reduction in Area: [1:57.90%] [2:N/A] [N/A:N/A] % Reduction in Volume: 57.90% [2:N/A] [N/A:N/A] Classification: [1:Category/Stage II] [2:Grade 0] [N/A:N/A] Exudate Amount: [1:Medium] [2:N/A]  [N/A:N/A] Exudate Type: [1:Serous] [2:N/A] [N/A:N/A] Exudate Color: [1:amber] [2:N/A] [N/A:N/A] Wound Margin: [1:Flat and Intact] [2:Flat and Intact] [N/A:N/A] Granulation Amount: [1:Large (67-100%)] [2:None Present (0%)] [N/A:N/A] Granulation Quality: [1:Red, Pink] [2:N/A] [N/A:N/A] Necrotic Amount: [1:None Present (0%)] [2:Large (67-100%)] [N/A:N/A] Exposed Structures: [1:Fat Layer (Subcutaneous Fascia: No Tissue) Exposed: Yes Fascia: No Tendon: No] [2:Fat Layer (Subcutaneous Tissue) Exposed: No Tendon: No] [N/A:N/A] Muscle: No Muscle: No Joint: No Joint: No Bone: No Bone: No Limited to Skin Breakdown Epithelialization: Small (1-33%) None N/A Debridement: N/A Open Wound/Selective N/A ([id number]) - Selective Pre-procedure N/A 14:20 N/A Verification/Time Out Taken: Pain Control: N/A Lidocaine 4%  Topical N/A Solution Tissue Debrided: N/A Necrotic/Eschar, N/A Fibrin/Slough, Skin Level: N/A Non-Viable Tissue N/A Debridement Area (sq N/A 16 N/A cm): Instrument: N/A Forceps, Scissors N/A Bleeding: N/A Minimum N/A Hemostasis Achieved: N/A Pressure N/A Procedural Pain: N/A 0 N/A Post Procedural Pain: N/A 0 N/A Debridement Treatment N/A Procedure was tolerated N/A Response: well Post Debridement N/A 4x4x0.1 N/A Measurements L x W x D (cm) Post Debridement N/A 1.257 N/A Volume: (cm) Periwound Skin Texture: Excoriation: Yes Excoriation: No N/A Induration: No Induration: No Callus: No Callus: No Crepitus: No Crepitus: No Rash: No Rash: No Scarring: No Scarring: No Periwound Skin Maceration: No Maceration: No N/A Moisture: Dry/Scaly: No Dry/Scaly: No Periwound Skin Color: Erythema: Yes Atrophie Blanche: No N/A Atrophie Blanche: No Cyanosis: No Cyanosis: No Ecchymosis: No Ecchymosis: No Erythema: No Hemosiderin Staining: No Hemosiderin Staining: No Mottled: No Mottled: No Pallor: No Pallor: No Rubor: No Rubor: No Erythema Location: Circumferential N/A  N/A Temperature: No Abnormality No Abnormality N/A Tenderness on Yes Yes N/A Palpation: Packett, Sandra C. ([id number]) Wound Preparation: Ulcer Cleansing: Ulcer Cleansing: N/A Rinsed/Irrigated with Rinsed/Irrigated with Saline Saline Topical Anesthetic Topical Anesthetic Applied: Other: lidocaine Applied: Other: lidocaine 4% 4% Procedures Performed: N/A Debridement N/A Treatment Notes Electronic Signature(s) Signed: 06/27/2016 7:53:49 AM By: Linton Ham MD Entered By: Linton Ham on 06/26/2016 14:31:04 Behrens, Leslye [name] ([id number]) -------------------------------------------------------------------------------- Multi-Disciplinary Care Plan Details Patient Name: GIOVANNIE, SCERBO C. Date of Service: 06/26/2016 3:00 PM Medical Record Number: [id number] Patient Account Number: [id number] Date of Birth/Sex: [birth year] (66 y.o. Male) Treating RN: Baruch Gouty, RN, BSN, Velva Harman Primary Care Lorren Splawn: Otilio Miu Other Clinician: Referring Breniya Goertzen: Otilio Miu Treating Nalany Steedley/Extender: Ricard Dillon Weeks in Treatment: 22 Active Inactive ` Nutrition Nursing Diagnoses: Imbalanced nutrition Impaired glucose control: actual or potential Goals: Patient/caregiver agrees to and verbalizes understanding of need to use nutritional supplements and/or vitamins as prescribed Date Initiated: 01/24/2016 Target Resolution Date: 03/27/2016 Goal Status: Active Patient/caregiver will maintain therapeutic glucose control Date Initiated: 01/24/2016 Target Resolution Date: 03/27/2016 Goal Status: Active Interventions: Assess patient nutrition upon admission and as needed per policy Provide education on elevated blood sugars and impact on wound healing Notes: ` Orientation to the Wound Care Program Nursing Diagnoses: Knowledge deficit related to the wound healing center program Goals: Patient/caregiver will verbalize understanding of the [place] Date Initiated:  01/24/2016 Target Resolution Date: 03/27/2016 Goal Status: Active Interventions: Provide education on orientation to the wound center Notes: DUDLEY, MAGES ([id number]) ` Pain, Acute or Chronic Nursing Diagnoses: Pain, acute or chronic: actual or potential Potential alteration in comfort, pain Goals: Patient will verbalize adequate pain control and receive pain control interventions during procedures as needed Date Initiated: 01/24/2016 Target Resolution Date: 03/27/2016 Goal Status: Active Patient/caregiver will verbalize adequate pain control between visits Date Initiated: 01/24/2016 Target Resolution Date: 03/27/2016 Goal Status: Active Patient/caregiver will verbalize comfort level met Date Initiated: 01/24/2016 Target Resolution Date: 03/27/2016 Goal Status: Active Interventions: Assess comfort goal upon admission Complete pain assessment as per visit requirements Notes: ` Pressure Nursing Diagnoses: Knowledge deficit related to management of pressures ulcers Potential for impaired tissue integrity related to pressure, friction, moisture, and shear Goals: Patient will remain free from development of additional pressure ulcers Date Initiated: 01/24/2016 Target Resolution Date: 03/27/2016 Goal Status: Active Interventions: Assess: immobility, friction, shearing, incontinence upon admission and as needed Assess offloading mechanisms upon admission and as needed Assess potential for pressure ulcer upon admission and as needed Provide education on pressure ulcers Notes: AYVIN, LIPINSKI. ([id number]) Wound/Skin  Impairment Nursing Diagnoses: Impaired tissue integrity Goals: Ulcer/skin breakdown will have a volume reduction of 30% by week 4 Date Initiated: 01/24/2016 Target Resolution Date: 03/27/2016 Goal Status: Active Ulcer/skin breakdown will have a volume reduction of 50% by week 8 Date Initiated: 01/24/2016 Target Resolution Date: 03/27/2016 Goal Status:  Active Ulcer/skin breakdown will have a volume reduction of 80% by week 12 Date Initiated: 01/24/2016 Target Resolution Date: 03/27/2016 Goal Status: Active Interventions: Assess patient/caregiver ability to perform ulcer/skin care regimen upon admission and as needed Assess ulceration(s) every visit Notes: Electronic Signature(s) Signed: 06/26/2016 4:25:14 PM By: Regan Lemming BSN, RN Entered By: Regan Lemming on 06/26/2016 14:20:19 Goertz, Leslye [name] ([id number]) -------------------------------------------------------------------------------- Pain Assessment Details Patient Name: [name] Coast C. Date of Service: 06/26/2016 3:00 PM Medical Record Number: [id number] Patient Account Number: [id number] Date of Birth/Sex: May 28, 1950 (66 y.o. Male) Treating RN: Baruch Gouty, RN, BSN, Velva Harman Primary Care Amran Malter: Otilio Miu Other Clinician: Referring Christi Wirick: Otilio Miu Treating Demtrius Rounds/Extender: Ricard Dillon Weeks in Treatment: 22 Active Problems Location of Pain Severity and Description of Pain Patient Has Paino No Site Locations With Dressing Change: No Pain Management and Medication Current Pain Management: Electronic Signature(s) Signed: 06/26/2016 4:25:14 PM By: Regan Lemming BSN, RN Entered By: Regan Lemming on 06/26/2016 13:55:43 Friley, Leslye [name] ([id number]) -------------------------------------------------------------------------------- Wound Assessment Details Patient Name: Rainwater, Kishawn C. Date of Service: 06/26/2016 3:00 PM Medical Record Number: [id number] Patient Account Number: [id number] Date of Birth/Sex: [birth year] (66 y.o. Male) Treating RN: Baruch Gouty, RN, BSN, [place] Primary Care Caid Radin: Otilio Miu Other Clinician: Referring Gaurav Baldree: Otilio Miu Treating [place] Nolton/Extender: Ricard Dillon Weeks in Treatment: 22 Wound Status Wound Number: 1 Primary Pressure Ulcer Etiology: Wound Location: Right Back - Proximal Wound Open Wounding Event: Pressure  Injury Status: Date Acquired: 11/24/2015 Comorbid Coronary Artery Disease, Type II Weeks Of Treatment: 22 History: Diabetes, Quadriplegia, Seizure Clustered Wound: No Disorder Photos Photo Uploaded By: Regan Lemming on 06/26/2016 16:30:48 Wound Measurements Length: (cm) 2.4 Width: (cm) 1.8 Depth: (cm) 0.1 Area: (cm) 3.393 Volume: (cm) 0.339 % Reduction in Area: 57.9% % Reduction in Volume: 57.9% Epithelialization: Small (1-33%) Tunneling: No Undermining: No Wound Description Classification: Category/Stage II Foul Odor Aft Wound Margin: Flat and Intact Slough/Fibrin Exudate Amount: Medium Exudate Type: Serous Exudate Color: amber er Cleansing: No o No Wound Bed Granulation Amount: Large (67-100%) Exposed Structure Granulation Quality: Red, Pink Fascia Exposed: No Necrotic Amount: None Present (0%) Fat Layer (Subcutaneous Tissue) Exposed: Yes Tendon Exposed: No Grable, Tiegan C. ([id number]) Muscle Exposed: No Joint Exposed: No Bone Exposed: No Periwound Skin Texture Texture Color No Abnormalities Noted: No No Abnormalities Noted: No Callus: No Atrophie Blanche: No Crepitus: No Cyanosis: No Excoriation: Yes Ecchymosis: No Induration: No Erythema: Yes Rash: No Erythema Location: Circumferential Scarring: No Hemosiderin Staining: No Mottled: No Moisture Pallor: No No Abnormalities Noted: No Rubor: No Dry / Scaly: No Maceration: No Temperature / Pain Temperature: No Abnormality Tenderness on Palpation: Yes Wound Preparation Ulcer Cleansing: Rinsed/Irrigated with Saline Topical Anesthetic Applied: Other: lidocaine 4%, Electronic Signature(s) Signed: 06/26/2016 4:25:14 PM By: Regan Lemming BSN, RN Entered By: Regan Lemming on 06/26/2016 14:02:11 Percle, Leslye [name] ([id number]) -------------------------------------------------------------------------------- Wound Assessment Details Patient Name: Marano, Lyndall C. Date of Service: 06/26/2016 3:00 PM Medical  Record Number: [id number] Patient Account Number: [id number] Date of Birth/Sex: Jul 07, 1950 (66 y.o. Male) Treating RN: Baruch Gouty, RN, BSN, Velva Harman Primary Care Anvika Gashi: Otilio Miu Other Clinician: Referring Anthony Roland: Otilio Miu Treating Adeena Bernabe/Extender: Ricard Dillon Weeks in Treatment: 22 Wound Status Wound Number: 2 Primary Diabetic Wound/Ulcer  of the Lower Etiology: Extremity Wound Location: Left Calcaneus Wound Open Wounding Event: Gradually Appeared Status: Date Acquired: 06/12/2016 Comorbid Coronary Artery Disease, Type II Weeks Of Treatment: 0 History: Diabetes, Quadriplegia, Seizure Clustered Wound: No Disorder Photos Photo Uploaded By: Regan Lemming on 06/26/2016 16:31:17 Wound Measurements Length: (cm) 6.5 Width: (cm) 6.5 Depth: (cm) 0.1 Area: (cm) 33.183 Volume: (cm) 3.318 % Reduction in Area: % Reduction in Volume: Epithelialization: None Tunneling: No Undermining: No Wound Description Classification: Grade 0 Wound Margin: Flat and Intact Foul Odor After Cleansing: No Slough/Fibrino Yes Wound Bed Granulation Amount: None Present (0%) Exposed Structure Necrotic Amount: Large (67-100%) Fascia Exposed: No Fat Layer (Subcutaneous Tissue) Exposed: No Tendon Exposed: No Muscle Exposed: No Joint Exposed: No Neuhaus, Adalid C. ([id number]) Bone Exposed: No Limited to Skin Breakdown Periwound Skin Texture Texture Color No Abnormalities Noted: No No Abnormalities Noted: No Callus: No Atrophie Blanche: No Crepitus: No Cyanosis: No Excoriation: No Ecchymosis: No Induration: No Erythema: No Rash: No Hemosiderin Staining: No Scarring: No Mottled: No Pallor: No Moisture Rubor: No No Abnormalities Noted: No Dry / Scaly: No Temperature / Pain Maceration: No Temperature: No Abnormality Tenderness on Palpation: Yes Wound Preparation Ulcer Cleansing: Rinsed/Irrigated with Saline Topical Anesthetic Applied: Other: lidocaine 4%, Electronic  Signature(s) Signed: 06/26/2016 4:25:14 PM By: Regan Lemming BSN, RN Entered By: Regan Lemming on 06/26/2016 14:06:30 Blakney, Leslye [name] ([id number]) -------------------------------------------------------------------------------- Vitals Details Patient Name: [name] Coast C. Date of Service: 06/26/2016 3:00 PM Medical Record Number: [id number] Patient Account Number: [id number] Date of Birth/Sex: 04-06-1950 (66 y.o. Male) Treating RN: Baruch Gouty, RN, BSN, [place] Primary Care Tomika Eckles: Otilio Miu Other Clinician: Referring Couper Juncaj: Otilio Miu Treating Elleanor Guyett/Extender: Ricard Dillon Weeks in Treatment: 22 Vital Signs Time Taken: 13:55 Pulse (bpm): 85 Height (in): 70 Respiratory Rate (breaths/min): 16 Weight (lbs): 187 Blood Pressure (mmHg): 89/55 Body Mass Index (BMI): 26.8 Reference Range: 80 - 120 mg / dl Electronic Signature(s) Signed: 06/26/2016 4:25:14 PM By: Regan Lemming BSN, RN Entered By: Regan Lemming on 06/26/2016 13:58:59

## 2016-06-28 NOTE — Progress Notes (Signed)
AVANISH, CERULLO ([id number]) Visit Report for 06/26/2016 Chief Complaint Document Details Patient Name: [name], [name]. Date of Service: 06/26/2016 3:00 PM Medical Record Number: [id number] Patient Account Number: [id number] Date of Birth/Sex: 1950/03/20 (66 y.o. Male) Treating RN: Clover Mealy, RN, BSN, [place] Sink Primary Care Provider: Elizabeth Sauer Other Clinician: Referring Provider: Elizabeth Sauer Treating Provider/Extender: Maxwell Caul Weeks in Treatment: 22 Information Obtained from: Patient Chief Complaint 01/24/16 patient arrives today for review of a pressure ulcer on his right scapula in the setting of incomplete C3-C4 quadriplegia Electronic Signature(s) Signed: 06/27/2016 7:53:49 AM By: Baltazar Najjar MD Entered By: Baltazar Najjar on 06/26/2016 14:31:31 Rhem, [name] ([id number]) -------------------------------------------------------------------------------- Debridement Details Patient Name: [name] Bras C. Date of Service: 06/26/2016 3:00 PM Medical Record Number: [id number] Patient Account Number: [id number] Date of Birth/Sex: 22-Sep-1950 (66 y.o. Male) Treating RN: Clover Mealy, RN, BSN, [place] Sink Primary Care Provider: Elizabeth Sauer Other Clinician: Referring Provider: Elizabeth Sauer Treating Provider/Extender: Altamese [place] in Treatment: 22 Debridement Performed for Wound #2 Left Calcaneus Assessment: Performed By: Physician Maxwell Caul, MD Debridement: Open Wound/Selective Debridement Selective Description: Pre-procedure Yes - 14:20 Verification/Time Out Taken: Start Time: 14:19 Pain Control: Lidocaine 4% Topical Solution Level: Non-Viable Tissue Total Area Debrided (L x 4 (cm) x 4 (cm) = 16 (cm) W): Tissue and other Non-Viable, Eschar, Fibrin/Slough, Skin material debrided: Instrument: Forceps, Scissors Bleeding: Minimum Hemostasis Achieved: Pressure End Time: 14:22 Procedural Pain: 0 Post Procedural Pain: 0 Response to Treatment: Procedure was  tolerated well Post Debridement Measurements of Total Wound Length: (cm) 4 Width: (cm) 4 Depth: (cm) 0.1 Volume: (cm) 1.257 Character of Wound/Ulcer Post Stable Debridement: Severity of Tissue Post Debridement: Limited to breakdown of skin Post Procedure Diagnosis Same as Pre-procedure Electronic Signature(s) Signed: 06/26/2016 4:25:14 PM By: Elpidio Eric BSN, RN Signed: 06/27/2016 7:53:49 AM By: Baltazar Najjar MD JAROME, TRULL ([id number]) Entered By: Baltazar Najjar on 06/26/2016 14:31:16 Domke, [name] ([id number]) -------------------------------------------------------------------------------- HPI Details Patient Name: [name], [name] Date of Service: 06/26/2016 3:00 PM Medical Record Number: [id number] Patient Account Number: [id number] Date of Birth/Sex: 1950/08/04 (66 y.o. 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 He has lower extremity sensation and can move his legs but has no/limited control over his arms. His wife accompanies him today and states that in the late spring or early summer of 2017 the patient became very depressed. He refused the refused to mobilize and he developed several pressure ulcers on his back. Most of these have healed however they have a recalcitrant area over the right scapula. They've been using Santyl on this for at least the last month. In terms of depression the patient is doing better now on an antidepressant. He saw his primary physician on 01/12/16 at which time there was apparently green drainage coming out of this area [Dr. Deana Jones]. Dr. Yetta Barre works in the [facility]. He has completed this Septra. Otherwise [facility] that he has a history of seizures. He also had a stroke in 2008 he follows with neurology. He also has type 2 diabetes on Glucophage, hyperlipidemia and gastroesophageal reflux. He takes Plavix for stroke prevention and Keppra for seizure prophylaxis. 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 02/08/16; this is a patient with a pressure ulcer over the right scapula. His wife has been doing the dressing with Santyl and border foam change every second day. When he arrived here 2 weeks ago we did a fairly extensive mechanical debridement. We are asked medical modalities to go out to the home and see what they might be eligible for in terms of pressure-relief surfaces [level 2] but the wife states that they have not heard from them. 03/20/15; this is a patient we haven't seen in 5-6 weeks. This was largely due to transportation issues. They've been using Santyl and border foam changing every second day for a pressure injury over the right scapula. The patient has a C3-C4 spinal injury. We had also asked for a review by the people who supplied DME to look at his wheelchair cushion, mattress etc. I don't know that this ever happened. In the meantime the wound has done remarkably well current measurements 1.8 x 2 x 0.1 04/03/16; the patient's dimensions have gone up to 3 cm in diameter quite a deterioration from last time. He is also complaining of pain in this area which is new. 04/10/16; 3 x 1.5 x 0.1. No difference from last week. Culture I did last week was negative he has completed antibiotics. 04/24/16 2.4 x 2 x 0.1; wound generally looks smaller. Middle area that I had to debrided last time looks healthier. His son is fashioned a large piece of foam cut out where the patient's wound with hit the back of his wheelchair 05/01/16; wound is a same size  however the surface of this looks better. The middle innkeeper area required a repeat debridement 05/15/16; wound is down and dimensions granulation still looks healthy. The medial aspect of this wound is now the deeper Divot. We'll see how this responds to further healing. We're using Hydrofera Blue 06/05/16; Wound 1.7x1.1x0.1 still using hyudrofera blue 06/26/16; the patient arrives back in clinic after a three-week hiatus. He was hospitalized from 06/09/16 through 06/10/16. He was found to be confused. CT scan of the head showed nothing really acute. His sodium was [place], Guadalupe C. ([id number]) low at 131. He was rehydrated. He was felt to have a UTI and given antibiotics and antibiotics at discharge although his final culture result only showed multiple organisms. His wife says today that at the time of the hospitalization they discovered a large intact blister over the large aspect of his left calcaneus. This is recently ruptured. The wound on his right scapula is somewhat larger. More his wife is concerned about his current status. She states that he is still confused sleeps for long periods. He is not having fever chills cough or diarrhea [1 loose bowel movement per day]. He continues to have a suprapubic catheter. She notes that he is not eating and drinking well. Patient states he just does not want to eat. He does not feel nauseated or vomit. 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 Admission sodium was 131 at discharge 139 BUN 22 and 1.22 at admission, 17 and 1 at discharge. Electronic Signature(s) Signed: 06/27/2016 7:53:49 AM By: Baltazar Najjar MD Entered By: Baltazar Najjar on 06/26/2016 14:35:00 Cortinas, [name] ([id number]) -------------------------------------------------------------------------------- Physical Exam Details Patient Name: RANSOME, HELWIG C. Date of Service: 06/26/2016 3:00  PM Medical Record Number: [id number] Patient  Account Number: [id number] Date of Birth/Sex: [birth year] (66 y.o. Male) Treating RN: Clover Mealy, RN, BSN, [place] Sink Primary Care Provider: Elizabeth Sauer Other Clinician: Referring Provider: Elizabeth Sauer Treating Provider/Extender: Maxwell Caul Weeks in Treatment: 22 Constitutional Patient is hypotensive.. Pulse regular and within target range for patient.Marland Kitchen Respirations regular, non-labored and within target range.. Temperature is normal and within the target range for the patient.. Patient's appearance is neat and clean. Appears in no acute distress. Well nourished and well developed.. Eyes Conjunctivae clear. No discharge.. Ears, Nose, Mouth, and Throat Mucous membranes somewhat dry no oral lesions. Neck Neck supple and symmetrical. No masses or crepitus. Respiratory Respiratory effort is easy and symmetric bilaterally. Rate is normal at rest and on room air.. Bilateral breath sounds are clear and equal in all lobes with no wheezes, rales or rhonchi.. Cardiovascular Heart rhythm and rate regular, without murmur or gallop.. Gastrointestinal (GI) Bowel sounds are positive no masses. Suprapubic catheter. No liver or spleen enlargement or tenderness.. Integumentary (Hair, Skin) No rash. Psychiatric He does not appear to much different to me.. Notes Wound exam; pressure area over the right scapula is superficial however in the middle precariously close to his underlying scapula. There is no evidence of surrounding infection here oOver a large area of his left calcaneus I removed a large amount of denuded blisters skin with pickups and scissors. The underlying basis of this is healthy I don't see any evidence of infection here Electronic Signature(s) Signed: 06/27/2016 7:53:49 AM By: Baltazar Najjar MD Entered By: Baltazar Najjar on 06/26/2016 14:37:03 Grabel, [name]  ([id number]) -------------------------------------------------------------------------------- Physician Orders Details Patient Name: Guinta, Zacharias C. Date of Service: 06/26/2016 3:00 PM Medical Record Number: [id number] Patient Account Number: [id number] Date of Birth/Sex: 05-Oct-1950 (66 y.o. Male) Treating RN: Clover Mealy, RN, BSN, [place] Sink Primary Care Provider: Elizabeth Sauer Other Clinician: Referring Provider: Elizabeth Sauer Treating Provider/Extender: Altamese [place] in Treatment: 22 Verbal / Phone Orders: No Diagnosis Coding Wound Cleansing Wound #1 Right,Proximal Back o Clean wound with Normal Saline. o Cleanse wound with mild soap and water Wound #2 Left Calcaneus o Clean wound with Normal Saline. o Cleanse wound with mild soap and water Anesthetic Wound #1 Right,Proximal Back o Topical Lidocaine 4% cream applied to wound bed prior to debridement - CLINIC USE Wound #2 Left Calcaneus o Topical Lidocaine 4% cream applied to wound bed prior to debridement - CLINIC USE Skin Barriers/Peri-Wound Care Wound #1 Right,Proximal Back o Skin Prep Wound #2 Left Calcaneus o Skin Prep Primary Wound Dressing Wound #1 Right,Proximal Back o Hydrafera Blue Wound #2 Left Calcaneus o Aquacel Ag Secondary Dressing Wound #1 Right,Proximal Back o Dry Gauze o Boardered Foam Dressing Wound #2 Left Calcaneus [place], Ayodeji C. ([id number]) o ABD and Kerlix/Conform Dressing Change Frequency Wound #1 Right,Proximal Back o Change dressing every other day. - wet H.BLue with water before prior to removing it from the dressings Wound #2 Left Calcaneus o Change dressing every other day. - wet H.BLue with water before prior to removing it from the dressings Follow-up Appointments Wound #1 Right,Proximal Back o Return Appointment in 1 week. Wound #2 Left Calcaneus o Return Appointment in 1 week. Off-Loading Wound #1 Right,Proximal Back o Mattress o Turn and  reposition every 2 hours o Other: - Pad for wheelchair for positioning Additional Orders / Instructions Wound #1 Right,Proximal Back o Increase protein intake. Home Health Wound #1 Right,Proximal Back o Initiate Home Health for Skilled Nursing o Home Health Nurse may visit PRN to address patientos wound care  needs. o FACE TO FACE ENCOUNTER: MEDICARE and MEDICAID PATIENTS: I certify that this patient is under my care and that I had a face-to-face encounter that meets the physician face-to-face encounter requirements with this patient on this date. The encounter with the patient was in whole or in part for the following MEDICAL CONDITION: (primary reason for Home Healthcare) MEDICAL NECESSITY: I certify, that based on my findings, NURSING services are a medically necessary home health service. HOME BOUND STATUS: I certify that my clinical findings support that this patient is homebound (i.e., Due to illness or injury, pt requires aid of supportive devices such as crutches, cane, wheelchairs, walkers, the use of special transportation or the assistance of another person to leave their place of residence. There is a normal inability to leave the home and doing so requires considerable and taxing effort. Other absences are for medical reasons / religious services and are infrequent or of short duration when for other reasons). o If current dressing causes regression in wound condition, may D/C ordered dressing product/s and apply Normal Saline Moist Dressing daily until next Wound Healing Center / Other MD appointment. Notify Wound Healing Center of regression in wound condition at [phone]. MARTINO, TOMPSON ([id number]) o Please direct any NON-WOUND related issues/requests for orders to patient's Primary Care Physician Wound #2 Left Calcaneus o Initiate Home Health for Skilled Nursing o Home Health Nurse may visit PRN to address patientos wound care needs. o FACE TO  FACE ENCOUNTER: MEDICARE and MEDICAID PATIENTS: I certify that this patient is under my care and that I had a face-to-face encounter that meets the physician face-to-face encounter requirements with this patient on this date. The encounter with the patient was in whole or in part for the following MEDICAL CONDITION: (primary reason for Home Healthcare) MEDICAL NECESSITY: I certify, that based on my findings, NURSING services are a medically necessary home health service. HOME BOUND STATUS: I certify that my clinical findings support that this patient is homebound (i.e., Due to illness or injury, pt requires aid of supportive devices such as crutches, cane, wheelchairs, walkers, the use of special transportation or the assistance of another person to leave their place of residence. There is a normal inability to leave the home and doing so requires considerable and taxing effort. Other absences are for medical reasons / religious services and are infrequent or of short duration when for other reasons). o If current dressing causes regression in wound condition, may D/C ordered dressing product/s and apply Normal Saline Moist Dressing daily until next Wound Healing Center / Other MD appointment. Notify Wound Healing Center of regression in wound condition at [phone]. o Please direct any NON-WOUND related issues/requests for orders to patient's Primary Care Physician Medications-please add to medication list. Wound #1 Right,Proximal Back o Other: - VITAMIN C, ZINC, VIt A, MVI Electronic Signature(s) Signed: 06/26/2016 4:25:14 PM By: Elpidio Eric BSN, RN Signed: 06/27/2016 7:53:49 AM By: Baltazar Najjar MD Entered By: Elpidio Eric on 06/26/2016 14:31:27 Empson, [name] ([id number]) -------------------------------------------------------------------------------- Problem List Details Patient Name: Legrand, Trajan C. Date of Service: 06/26/2016 3:00 PM Medical Record Number:  [id number] Patient Account Number: [id number] Date of Birth/Sex: [birth year] (66 y.o. Male) Treating RN: Clover Mealy, RN, BSN, [place] Sink Primary Care Provider: Elizabeth Sauer Other Clinician: Referring Provider: Elizabeth Sauer Treating Provider/Extender: Maxwell Caul Weeks in Treatment: 22 Active Problems ICD-10 Encounter Code Description Active Date Diagnosis 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, stage 3 01/24/2016 Yes S14.103S Unspecified injury at C3 level of cervical  spinal cord, 01/24/2016 Yes sequela L89.622 Pressure ulcer of left heel, stage 2 06/26/2016 Yes Inactive Problems Resolved Problems Electronic Signature(s) Signed: 06/27/2016 7:53:49 AM By: Baltazar Najjarobson, Michael MD Entered By: Baltazar Najjarobson, Michael on 06/26/2016 14:30:15 Lehtinen, [name] ([id number]) -------------------------------------------------------------------------------- Progress Note Details Patient Name: Hennick, Ly C. Date of Service: 06/26/2016 3:00 PM Medical Record Number: [id number] Patient Account Number: [id number] Date of Birth/Sex: 05-Aug-1950 [age] y.o. 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 He has lower extremity sensation and can move his legs but has no/limited control over his arms. His wife accompanies him today and states that in the late spring or early summer of 2017 the patient became very depressed. He refused the refused to mobilize and he developed several pressure ulcers on his back.  Most of these have healed however they have a recalcitrant area over the right scapula. They've been using Santyl on this for at least the last month. In terms of depression the patient is doing better now on an antidepressant. He saw his primary physician on 01/12/16 at which time there was apparently green drainage coming out of this area [Dr. Deana Jones]. Dr. Yetta BarreJones works in the [facility]. He has completed this Septra. Otherwise [facility] that he has a history of seizures. He also had a stroke in 2008 he follows with neurology. He also has type 2 diabetes on Glucophage, hyperlipidemia and gastroesophageal reflux. He takes Plavix for stroke prevention and Keppra for seizure prophylaxis. 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 02/08/16; this is a patient with a pressure ulcer over the right scapula. His wife has been doing the dressing with Santyl and border foam change every second day. When he arrived here 2 weeks ago we did a fairly extensive mechanical debridement. We are asked medical modalities to go out to the home and see what they might be eligible for in terms of pressure-relief surfaces [level 2] but the wife states that they have not heard from them. 03/20/15; this is a patient we haven't seen in 5-6 weeks. This was largely due to transportation issues. They've been using Santyl and border foam changing every second day for a pressure injury over the right scapula. The patient has a C3-C4 spinal injury. We had also asked for a review by the people who supplied DME to look at his wheelchair cushion, mattress etc. I don't know that this ever happened. In the meantime the wound has done remarkably well current measurements 1.8 x 2 x 0.1 04/03/16; the patient's dimensions have gone up to 3 cm in diameter quite a deterioration from last time. He is also complaining of  pain in this area which is new. 04/10/16; 3 x 1.5 x 0.1. No difference from last week. Culture I did last week was negative he has completed antibiotics. 04/24/16 2.4 x 2 x 0.1; wound generally looks smaller. Middle area that I had to debrided last time looks healthier. His son  is fashioned a large piece of foam cut out where the patient's wound with hit the back of his wheelchair 05/01/16; wound is a same size however the surface of this looks better. The middle innkeeper area required [facility], MARQUAVION VENHUIZEN. ([id number]) a repeat debridement 05/15/16; wound is down and dimensions granulation still looks healthy. The medial aspect of this wound is now the deeper Divot. We'll see how this responds to further healing. We're using Hydrofera Blue 06/05/16; Wound 1.7x1.1x0.1 still using hyudrofera blue 06/26/16; the patient arrives back in clinic after a three-week hiatus. He was hospitalized from 06/09/16 through 06/10/16. He was found to be confused. CT scan of the head showed nothing really acute. His sodium was low at 131. He was rehydrated. He was felt to have a UTI and given antibiotics and antibiotics at discharge although his final culture result only showed multiple organisms. His wife says today that at the time of the hospitalization they discovered a large intact blister over the large aspect of his left calcaneus. This is recently ruptured. The wound on his right scapula is somewhat larger. More his wife is concerned about his current status. She states that he is still confused sleeps for long periods. He is not having fever chills cough or diarrhea [1 loose bowel movement per day]. He continues to have a suprapubic catheter. She notes that he is not eating and drinking well. Patient states he just does not want to eat. He does not feel nauseated or vomit. 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 Admission sodium  was 131 at discharge 139 BUN 22 and 1.22 at admission, 17 and 1 at discharge. Objective Constitutional Patient is hypotensive.. Pulse regular and within target range for patient.Marland Kitchen Respirations regular, non-labored and within target range.. Temperature is normal and within the target range for the patient.. Patient's appearance is neat and clean. Appears in no acute distress. Well nourished and well developed.. Vitals Time Taken: 1:55 PM, Height: 70 in, Weight: 187 lbs, BMI: 26.8, Pulse: 85 bpm, Respiratory Rate: 16 breaths/min, Blood Pressure: 89/55 mmHg. Eyes Conjunctivae clear. No discharge.. Ears, Nose, Mouth, and Throat Mucous membranes somewhat dry no oral lesions. Neck Neck supple and symmetrical. No masses or crepitus. Respiratory Respiratory effort is easy and symmetric bilaterally. Rate is normal at rest and on room air.. Bilateral breath sounds are clear and equal in all lobes with no wheezes, rales or rhonchi.. Cardiovascular Heart rhythm and rate regular, without murmur or gallop.Marland Kitchen [place], Jahan C. ([id number]) Gastrointestinal (GI) Bowel sounds are positive no masses. Suprapubic catheter. No liver or spleen enlargement or tenderness.Marland Kitchen Psychiatric He does not appear to much different to me.. General Notes: Wound exam; pressure area over the right scapula is superficial however in the middle precariously close to his underlying scapula. There is no evidence of surrounding infection here Over a large area of his left calcaneus I removed a large amount of denuded blisters skin with pickups and scissors. The underlying basis of this is healthy I don't see any evidence of infection here Integumentary (Hair, Skin) No rash. Wound #1 status is Open. Original cause of wound was Pressure Injury. The wound is located on the Right,Proximal Back. The wound measures 2.4cm length x 1.8cm width x 0.1cm depth; 3.393cm^2 area and 0.339cm^3 volume. There is Fat Layer (Subcutaneous Tissue)  Exposed exposed. There is no tunneling or undermining noted. There is a medium amount of serous drainage noted.  The wound margin is flat and intact. There is large (67-100%) red, pink granulation within the wound bed. There is no necrotic tissue within the wound bed. The periwound skin appearance exhibited: Excoriation, Erythema. The periwound skin appearance did not exhibit: Callus, Crepitus, Induration, Rash, Scarring, Dry/Scaly, Maceration, Atrophie Blanche, Cyanosis, Ecchymosis, Hemosiderin Staining, Mottled, Pallor, Rubor. The surrounding wound skin color is noted with erythema which is circumferential. Periwound temperature was noted as No Abnormality. The periwound has tenderness on palpation. Wound #2 status is Open. Original cause of wound was Gradually Appeared. The wound is located on the Left Calcaneus. The wound measures 6.5cm length x 6.5cm width x 0.1cm depth; 33.183cm^2 area and 3.318cm^3 volume. The wound is limited to skin breakdown. There is no tunneling or undermining noted. The wound margin is flat and intact. There is no granulation within the wound bed. There is a large (67- 100%) amount of necrotic tissue within the wound bed. The periwound skin appearance did not exhibit: Callus, Crepitus, Excoriation, Induration, Rash, Scarring, Dry/Scaly, Maceration, Atrophie Blanche, Cyanosis, Ecchymosis, Hemosiderin Staining, Mottled, Pallor, Rubor, Erythema. Periwound temperature was noted as No Abnormality. The periwound has tenderness on palpation. 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 There was a Non-Viable Tissue Open Wound/Selective [phone]) debridement with total  area of 16 sq cm performed by Maxwell Caul, MD. with the following instrument(s): Forceps and Scissors to remove Non-Viable tissue/material including Fibrin/Slough, Eschar, and Skin after achieving pain control using Lidocaine 4% Topical Solution. A time out was conducted at 14:20, prior to the start of the procedure. A Minimum amount of bleeding was controlled with Pressure. The procedure was tolerated well with a pain level of 0 throughout and a pain level of 0 following the procedure. Post Debridement Measurements: 4cm length x 4cm width x 0.1cm depth; 1.257cm^3 volume. Character of Wound/Ulcer Post Debridement is stable. Severity of Tissue Post Debridement is: Limited to breakdown of skin. Post procedure Diagnosis Wound #2: Same as Pre-Procedure Plan Wound Cleansing: Wound #1 Right,Proximal Back: Clean wound with Normal Saline. Cleanse wound with mild soap and water Wound #2 Left Calcaneus: Clean wound with Normal Saline. Cleanse wound with mild soap and water Anesthetic: Wound #1 Right,Proximal Back: Topical Lidocaine 4% cream applied to wound bed prior to debridement - CLINIC USE Wound #2 Left Calcaneus: Topical Lidocaine 4% cream applied to wound bed prior to debridement - CLINIC USE Skin Barriers/Peri-Wound Care: Wound #1 Right,Proximal Back: Skin Prep Wound #2 Left Calcaneus: Skin Prep Primary Wound Dressing: Wound #1 Right,Proximal Back: Hydrafera Blue Wound #2 Left Calcaneus: Aquacel Ag Secondary Dressing: Wound #1 Right,Proximal Back: Dry Gauze Gillaspie, Abem C. ([id number]) Boardered Foam Dressing Wound #2 Left Calcaneus: ABD and Kerlix/Conform Dressing Change Frequency: Wound #1 Right,Proximal Back: Change dressing every other day. - wet H.BLue with water before prior to removing it from the dressings Wound #2 Left Calcaneus: Change dressing every other day. - wet H.BLue with water before prior to removing it from the dressings Follow-up  Appointments: Wound #1 Right,Proximal Back: Return Appointment in 1 week. Wound #2 Left Calcaneus: Return Appointment in 1 week. Off-Loading: Wound #1 Right,Proximal Back: Mattress Turn and reposition every 2 hours Other: - Pad for wheelchair for positioning Additional Orders /  Instructions: Wound #1 Right,Proximal Back: Increase protein intake. Home Health: Wound #1 Right,Proximal Back: Initiate Home Health for Skilled Nursing Home Health Nurse may visit PRN to address patient s wound care needs. FACE TO FACE ENCOUNTER: MEDICARE and MEDICAID PATIENTS: I certify that this patient is under my care and that I had a face-to-face encounter that meets the physician face-to-face encounter requirements with this patient on this date. The encounter with the patient was in whole or in part for the following MEDICAL CONDITION: (primary reason for Home Healthcare) MEDICAL NECESSITY: I certify, that based on my findings, NURSING services are a medically necessary home health service. HOME BOUND STATUS: I certify that my clinical findings support that this patient is homebound (i.e., Due to illness or injury, pt requires aid of supportive devices such as crutches, cane, wheelchairs, walkers, the use of special transportation or the assistance of another person to leave their place of residence. There is a normal inability to leave the home and doing so requires considerable and taxing effort. Other absences are for medical reasons / religious services and are infrequent or of short duration when for other reasons). If current dressing causes regression in wound condition, may D/C ordered dressing product/s and apply Normal Saline Moist Dressing daily until next Wound Healing Center / Other MD appointment. Notify Wound Healing Center of regression in wound condition at [phone]. Please direct any NON-WOUND related issues/requests for orders to patient's Primary Care Physician Wound #2 Left  Calcaneus: Initiate Home Health for Skilled Nursing Home Health Nurse may visit PRN to address patient s wound care needs. FACE TO FACE ENCOUNTER: MEDICARE and MEDICAID PATIENTS: I certify that this patient is under my care and that I had a face-to-face encounter that meets the physician face-to-face encounter requirements with this patient on this date. The encounter with the patient was in whole or in part for the following MEDICAL CONDITION: (primary reason for Home Healthcare) MEDICAL NECESSITY: I certify, that based on my findings, NURSING services are a medically necessary home health service. HOME BOUND STATUS: I certify that my clinical findings support that this patient is homebound (i.e., Due to illness or injury, pt requires aid of supportive devices such as crutches, cane, wheelchairs, walkers, the use of special transportation or the assistance of another person to leave their place of residence. There is a normal inability to leave the home and doing so requires considerable and taxing effort. Other absences are ULES, MARSALA ([id number]) for medical reasons / religious services and are infrequent or of short duration when for other reasons). If current dressing causes regression in wound condition, may D/C ordered dressing product/s and apply Normal Saline Moist Dressing daily until next Wound Healing Center / Other MD appointment. Notify Wound Healing Center of regression in wound condition at [phone]. Please direct any NON-WOUND related issues/requests for orders to patient's Primary Care Physician Medications-please add to medication list.: Wound #1 Right,Proximal Back: Other: - VITAMIN C, ZINC, VIt A, MVI #1 his original wound over the right scapula is slightly bigger but otherwise unchanged. We continued with Hydrofera Blue to this area #2 left calcaneus we used silver alginate/Aquacel Ag, ABD, Kerlix and conformer #3 clearly his wife is quite stressed today. I  have not seen her look like this before. We'll get home health and to help with the dressing changes. #4 according to his wife acute delirium which hasn't really resolved. This could mean an ongoing process or could simply be dead delayed result of the underlying  initial processes such as hyponatremia. I have advised her to hold the Lasix that he was on apparently for upper extremity edema at some point and she is giving him Gatorade which is really quite reasonable #5 we will see him back in a week for wound care Electronic Signature(s) Signed: 06/27/2016 7:53:49 AM By: Baltazar Najjar MD Entered By: Baltazar Najjar on 06/26/2016 14:39:19 Blumenstock, [name] ([id number]) -------------------------------------------------------------------------------- SuperBill Details Patient Name: [name] Bras C. Date of Service: 06/26/2016 Medical Record Number: [id number] Patient Account Number: [id number] Date of Birth/Sex: Oct 12, 1950 (66 y.o. 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 Facility Procedures CPT4 Code: [id number] Description: [phone] - [facility] WOUND 1ST 20 SQ CM OR < ICD-10 Description Diagnosis L89.622 Pressure ulcer of left heel, stage 2 Modifier: Quantity: 1 Physician Procedures CPT4 Code: [id number] Description: 99214 - WC PHYS LEVEL 4 - EST PT ICD-10 Description Diagnosis 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

## 2016-07-03 ENCOUNTER — Telehealth: Payer: Self-pay | Admitting: Family Medicine

## 2016-07-03 ENCOUNTER — Encounter: Payer: Medicare Other | Admitting: Internal Medicine

## 2016-07-03 DIAGNOSIS — G8252 Quadriplegia, C1-C4 incomplete: Secondary | ICD-10-CM | POA: Diagnosis not present

## 2016-07-03 DIAGNOSIS — L89622 Pressure ulcer of left heel, stage 2: Secondary | ICD-10-CM | POA: Diagnosis not present

## 2016-07-03 DIAGNOSIS — I959 Hypotension, unspecified: Secondary | ICD-10-CM | POA: Diagnosis not present

## 2016-07-03 DIAGNOSIS — E785 Hyperlipidemia, unspecified: Secondary | ICD-10-CM | POA: Diagnosis not present

## 2016-07-03 DIAGNOSIS — S14103S Unspecified injury at C3 level of cervical spinal cord, sequela: Secondary | ICD-10-CM | POA: Diagnosis not present

## 2016-07-03 DIAGNOSIS — S51001A Unspecified open wound of right elbow, initial encounter: Secondary | ICD-10-CM | POA: Diagnosis not present

## 2016-07-03 DIAGNOSIS — R569 Unspecified convulsions: Secondary | ICD-10-CM | POA: Diagnosis not present

## 2016-07-03 DIAGNOSIS — L89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, stage 3: Secondary | ICD-10-CM | POA: Diagnosis not present

## 2016-07-03 DIAGNOSIS — Z8673 Personal history of transient ischemic attack (TIA), and cerebral infarction without residual deficits: Secondary | ICD-10-CM | POA: Diagnosis not present

## 2016-07-03 DIAGNOSIS — L89113 Pressure ulcer of right upper back, stage 3: Secondary | ICD-10-CM | POA: Diagnosis not present

## 2016-07-03 DIAGNOSIS — K219 Gastro-esophageal reflux disease without esophagitis: Secondary | ICD-10-CM | POA: Diagnosis not present

## 2016-07-03 DIAGNOSIS — S91302A Unspecified open wound, left foot, initial encounter: Secondary | ICD-10-CM | POA: Diagnosis not present

## 2016-07-03 DIAGNOSIS — E1165 Type 2 diabetes mellitus with hyperglycemia: Secondary | ICD-10-CM | POA: Diagnosis not present

## 2016-07-03 DIAGNOSIS — Z7984 Long term (current) use of oral hypoglycemic drugs: Secondary | ICD-10-CM | POA: Diagnosis not present

## 2016-07-03 NOTE — Telephone Encounter (Signed)
[facility] Dr[facility] Yetta BarreJones Pt.

## 2016-07-03 NOTE — Telephone Encounter (Signed)
Pt may have, but I don't have a phone number or did I miss seeing it?

## 2016-07-03 NOTE — Telephone Encounter (Signed)
[facility] and requested an order for PT/OT/ST and a home health aid. Please advise.

## 2016-07-04 DIAGNOSIS — Z993 Dependence on wheelchair: Secondary | ICD-10-CM | POA: Diagnosis not present

## 2016-07-04 DIAGNOSIS — L89623 Pressure ulcer of left heel, stage 3: Secondary | ICD-10-CM | POA: Diagnosis not present

## 2016-07-04 DIAGNOSIS — L89113 Pressure ulcer of right upper back, stage 3: Secondary | ICD-10-CM | POA: Diagnosis not present

## 2016-07-04 DIAGNOSIS — Z466 Encounter for fitting and adjustment of urinary device: Secondary | ICD-10-CM | POA: Diagnosis not present

## 2016-07-04 DIAGNOSIS — Z9181 History of falling: Secondary | ICD-10-CM | POA: Diagnosis not present

## 2016-07-04 DIAGNOSIS — G8250 Quadriplegia, unspecified: Secondary | ICD-10-CM | POA: Diagnosis not present

## 2016-07-04 NOTE — Progress Notes (Addendum)
SYLAS, TWOMBLY ([id number]) Visit Report for 07/03/2016 Arrival Information Details Patient Name: [name], [name]. Date of Service: 07/03/2016 2:00 PM Medical Record Number: [id number] Patient Account Number: [id number] Date of Birth/Sex: [birth year] (66 y.o. Male) Treating RN: Montey Hora Primary Care Javier Gell: Otilio Miu Other Clinician: Referring Tavone Caesar: Otilio Miu Treating Anmarie Fukushima/Extender: Tito Dine in Treatment: 23 Visit Information History Since Last Visit Added or deleted any medications: No Patient Arrived: Wheel Chair Any new allergies or adverse reactions: No Arrival Time: 13:56 Had a fall or experienced change in No activities of daily living that may affect Accompanied By: spouse risk of falls: Transfer Assistance: None Signs or symptoms of abuse/neglect since last No Patient Identification Verified: Yes visito Secondary Verification Process Yes Hospitalized since last visit: No Completed: Pain Present Now: No Patient Requires Transmission-Based No Precautions: Patient Has Alerts: Yes Electronic Signature(s) Signed: 07/03/2016 4:56:51 PM By: Montey Hora Entered By: Montey Hora on 07/03/2016 13:56:49 Mesenbrink, [name] ([id number]) -------------------------------------------------------------------------------- Clinic Level of Care Assessment Details Patient Name: [name] Coast C. Date of Service: 07/03/2016 2:00 PM Medical Record Number: [id number] Patient Account Number: [id number] Date of Birth/Sex: November 18, 1950 (66 y.o. Male) Treating RN: Montey Hora Primary Care Chike Farrington: Otilio Miu Other Clinician: Referring Sinjin Amero: Otilio Miu Treating Krishna Dancel/Extender: Tito Dine in Treatment: 23 Clinic Level of Care Assessment Items TOOL 4 Quantity Score _0  - Use when only an EandM is performed on FOLLOW-UP visit 0 ASSESSMENTS - Nursing Assessment / Reassessment X - Reassessment of Co-morbidities (includes updates in  patient status) 1 10 X - Reassessment of Adherence to Treatment Plan 1 5 ASSESSMENTS - Wound and Skin Assessment / Reassessment _1  - Simple Wound Assessment / Reassessment - one wound 0 X - Complex Wound Assessment / Reassessment - multiple wounds 3 5 _2  - Dermatologic / Skin Assessment (not related to wound area) 0 ASSESSMENTS - Focused Assessment _3  - Circumferential Edema Measurements - multi extremities 0 _4  - Nutritional Assessment / Counseling / Intervention 0 _5  - Lower Extremity Assessment (monofilament, tuning fork, pulses) 0 _6  - Peripheral Arterial Disease Assessment (using hand held doppler) 0 ASSESSMENTS - Ostomy and/or Continence Assessment and Care _7  - Incontinence Assessment and Management 0 _8  - Ostomy Care Assessment and Management (repouching, etc.) 0 PROCESS - Coordination of Care X - Simple Patient / Family Education for ongoing care 1 15 _9  - Complex (extensive) Patient / Family Education for ongoing care 0 _10  - Staff obtains Programmer, systems, Records, Test Results / Process Orders 0 _11  - Staff telephones HHA, Nursing Homes / Clarify orders / etc 0 _12  - Routine Transfer to another Facility (non-emergent condition) 0 Facundo, Benzion C. ([id number]) _13  - Routine Hospital Admission (non-emergent condition) 0 _14  - New Admissions / Biomedical engineer / Ordering NPWT, Apligraf, etc. 0 _15  - Emergency Hospital Admission (emergent condition) 0 X - Simple Discharge Coordination 1 10 _16  - Complex (extensive) Discharge Coordination 0 PROCESS - Special Needs _17  - Pediatric / Minor Patient Management 0 _18  - Isolation Patient Management 0 _19  - Hearing / Language / Visual special needs 0 _20  - Assessment of Community assistance (transportation, D/C planning, etc.) 0 _21  - Additional assistance / Altered mentation 0 _22  - Support Surface(s) Assessment (bed, cushion, seat, etc.) 0 INTERVENTIONS - Wound Cleansing / Measurement _23  - Simple Wound Cleansing - one wound 0 X - Complex  Wound Cleansing - multiple wounds 3 5 X - Wound Imaging (photographs - any number of wounds) 1 5 _24  - Wound Tracing (instead of photographs)  0 _0  - Simple Wound Measurement - one wound 0 X - Complex Wound Measurement - multiple wounds 3 5 INTERVENTIONS - Wound Dressings X - Small Wound Dressing one or multiple wounds 3 10 _1  - Medium Wound Dressing one or multiple wounds 0 _2  - Large Wound Dressing one or multiple wounds 0 <UDJSHFWYOVZCHYIF>_0<\/YDXAJOINOMVEHMCN>_4  - Application of Medications - topical 0 <BSJGGEZMOQHUTMLY>_6<\/TKPTWSFKCLEXNTZG>_0  - Application of Medications - injection 0 INTERVENTIONS - Miscellaneous _5  - External ear exam 0 [name], [name] ([id number]) _6  - Specimen Collection (cultures, biopsies, blood, body fluids, etc.) 0 _7  - Specimen(s) / Culture(s) sent or taken to Lab for analysis 0 _8  - Patient Transfer (multiple staff / Harrel Lemon Lift / Similar devices) 0 _9  - Simple Staple / Suture removal (25 or less) 0 _10  - Complex Staple / Suture removal (26 or more) 0 _11  - Hypo / Hyperglycemic Management (close monitor of Blood Glucose) 0 _12  - Ankle / Brachial Index (ABI) - do not check if billed separately 0 X - Vital Signs 1 5 Has the patient been seen at the hospital within the last three years: Yes Total Score: 125 Level Of Care: New/Established - Level 4 Electronic Signature(s) Signed: 07/03/2016 4:56:51 PM By: Montey Hora Entered By: Montey Hora on 07/03/2016 16:18:30 [name], [name] ([id number]) -------------------------------------------------------------------------------- Complex / Palliative Patient Assessment Details Patient Name: [name], [name] Date of Service: 07/03/2016 2:00 PM Medical Record Number: [id number] Patient Account Number: [id number] Date of Birth/Sex: 12-Nov-1950 (66 y.o. Male) Treating RN: Cornell Barman Primary Care Elisabel Hanover: Otilio Miu Other Clinician: Referring Aizen Duval: Otilio Miu Treating Oniya Mandarino/Extender: Ricard Dillon Weeks in Treatment: 23 Palliative Management Criteria Complex Wound  Management Criteria Patient has remarkable or complex co-morbidities requiring medications or treatments that extend wound healing times. Examples: o Diabetes mellitus with chronic renal failure or end stage renal disease requiring dialysis o Advanced or poorly controlled rheumatoid arthritis o Diabetes mellitus and end stage chronic obstructive pulmonary disease o Active cancer with current chemo- or radiation therapy Quadraplegia; limited mobility Care Approach Wound Care Plan: Complex Wound Management Electronic Signature(s) Signed: 07/04/2016 2:12:04 PM By: Gretta Cool RN, BSN, Kim RN, BSN Signed: 07/04/2016 5:27:22 PM By: Linton Ham MD Entered By: Gretta Cool, RN, BSN, Kim on 07/04/2016 14:12:04 [name], [name] ([id number]) -------------------------------------------------------------------------------- Encounter Discharge Information Details Patient Name: [name], [name] Date of Service: 07/03/2016 2:00 PM Medical Record Number: [id number] Patient Account Number: [id number] Date of Birth/Sex: June 22, 1950 (66 y.o. Male) Treating RN: Montey Hora Primary Care Cecil Vandyke: Otilio Miu Other Clinician: Referring Danielly Ackerley: Otilio Miu Treating Currie Dennin/Extender: Tito Dine in Treatment: 46 Encounter Discharge Information Items Discharge Pain Level: 0 Discharge Condition: Stable Ambulatory Status: Wheelchair Discharge Destination: Home Transportation: Private Auto Accompanied By: spouse Schedule Follow-up Appointment: Yes Medication Reconciliation completed No and provided to Patient/Care Francyne Arreaga: Patient Clinical Summary of Care: Declined Electronic Signature(s) Signed: 07/03/2016 3:12:19 PM By: Ruthine Dose Entered By: Ruthine Dose on 07/03/2016 15:12:19 [name], [name] ([id number]) -------------------------------------------------------------------------------- Multi Wound Chart Details Patient Name: [name] Coast C. Date of Service: 07/03/2016 2:00  PM Medical Record Number: [id number] Patient Account Number: [id number] Date of Birth/Sex: Jun 25, 1950 (66 y.o. 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, Other the Lower Extremity Comorbid History: Coronary Artery Disease, Coronary Artery Disease, Coronary Artery Disease, Type II Diabetes, Type II Diabetes, Type II Diabetes, Quadriplegia, Seizure Quadriplegia, Seizure Quadriplegia, Seizure Disorder Disorder Disorder Date Acquired: 11/24/2015 06/12/2016 06/28/2016 Weeks of Treatment: 23 1 0 Wound Status: Open Open Open Measurements L x W x D 2.1x1.6x0.2 3x4.2x0.1 1.8x2.1x0.1 (cm) Area (cm) : 2.639 9.896 2.969 Volume (cm) : 0.528 0.99 0.297 % Reduction in Area: 67.20% 70.20% N/A % Reduction in Volume: 34.40% 70.20% N/A Classification: Category/Stage II Grade 1 Partial Thickness Exudate Amount: Medium Large Large Exudate Type: Serous Serosanguineous Serosanguineous Exudate Color: amber red, brown red, brown Wound Margin: Flat and Intact Distinct, outline attached Distinct, outline attached Granulation Amount: Large (67-100%) Medium (34-66%) Large (67-100%) [name], [name] ([id number]) Granulation Quality: Red, Pink Red Red Necrotic Amount: None Present (0%) Medium (34-66%) None Present (0%) Necrotic Tissue: N/A Eschar, Adherent Slough N/A Exposed Structures: Fat Layer (Subcutaneous Fascia: No N/A Tissue) Exposed: Yes Fat Layer (Subcutaneous Fascia: No Tissue) Exposed:  No Tendon: No Tendon: No Muscle: No Muscle: No Joint: No Joint: No Bone: No Bone: No Limited to Skin Breakdown Epithelialization: Small (1-33%) None None Periwound Skin Texture: Excoriation: Yes Excoriation: No No Abnormalities Noted Induration: No Induration: No Callus: No Callus: No Crepitus: No Crepitus: No Rash: No Rash: No Scarring: No Scarring: No Periwound Skin Maceration: No Maceration: No No Abnormalities Noted Moisture: Dry/Scaly: No Dry/Scaly: No Periwound Skin Color: Erythema: Yes Atrophie Blanche: No No Abnormalities Noted Atrophie Blanche: No Cyanosis: No Cyanosis: No Ecchymosis: No Ecchymosis: No Erythema: No Hemosiderin Staining: No Hemosiderin Staining: No Mottled: No Mottled: No Pallor: No Pallor: No Rubor: No Rubor: No Erythema Location: Circumferential N/A N/A Temperature: No Abnormality No Abnormality No Abnormality Tenderness on Yes Yes Yes Palpation: Wound Preparation: Ulcer Cleansing: Ulcer Cleansing: Ulcer Cleansing: Rinsed/Irrigated with Rinsed/Irrigated with Rinsed/Irrigated with Saline Saline Saline Topical Anesthetic Topical Anesthetic Topical Anesthetic Applied: Other: lidocaine Applied: Other: lidocaine Applied: Other: lidocaine 4% 4% 4% Treatment Notes Wound #1 (Right, Proximal Back) 1. Cleansed with: Clean wound with Normal Saline 4. Dressing Applied: Aquacel Ag 5. Secondary Dressing Applied Bordered Foam Dressing CASMER, YEPIZ. ([id number]) Dry Gauze Wound #2 (Left Calcaneus) 1. Cleansed with: Clean wound with Normal Saline 2. Anesthetic Topical Lidocaine 4% cream to wound bed prior to debridement 4. Dressing Applied: Aquacel Ag 5. Secondary Dressing Applied ABD and Kerlix/Conform 7. Secured with Tape Wound #3 (Right Elbow) 1. Cleansed with: Clean wound with Normal Saline 4. Dressing Applied: Aquacel Ag 5. Secondary Dressing Applied Bordered Foam Dressing Dry Gauze Electronic Signature(s) Signed:  07/04/2016 12:30:45 PM By: Linton Ham MD Previous Signature: 07/03/2016 4:56:51 PM Version By: Montey Hora Entered By: Linton Ham on 07/03/2016 17:06:10 [name], [name] ([id number]) -------------------------------------------------------------------------------- Multi-Disciplinary Care Plan Details Patient Name: KENTARIUS, PARTINGTON C. Date of Service: 07/03/2016 2:00 PM Medical Record Number: [id number] Patient Account Number: [id number] Date of Birth/Sex: [birth year] (66 y.o. Male) Treating RN: Montey Hora Primary Care Carliyah Cotterman: Otilio Miu Other Clinician: Referring Derral Colucci: Otilio Miu Treating Kadir Azucena/Extender: Ricard Dillon Weeks in Treatment: 6 Active Inactive ` Nutrition Nursing Diagnoses: Imbalanced nutrition Impaired glucose control: actual or potential Goals: Patient/caregiver agrees to and verbalizes understanding of need to use nutritional supplements and/or vitamins as prescribed Date Initiated: 01/24/2016 Target Resolution Date: 03/27/2016 Goal Status: Active Patient/caregiver will maintain therapeutic glucose control Date Initiated: 01/24/2016 Target Resolution Date: 03/27/2016 Goal Status: Active Interventions: Assess patient nutrition upon admission and as needed per policy Provide education  on elevated blood sugars and impact on wound healing Notes: ` Orientation to the Wound Care Program Nursing Diagnoses: Knowledge deficit related to the wound healing center program Goals: Patient/caregiver will verbalize understanding of the [place] Program Date Initiated: 01/24/2016 Target Resolution Date: 03/27/2016 Goal Status: Active Interventions: Provide education on orientation to the wound center Notes: [name], [name] ([id number]) ` Pain, Acute or Chronic Nursing Diagnoses: Pain, acute or chronic: actual or potential Potential alteration in comfort, pain Goals: Patient will verbalize adequate pain control and receive pain  control interventions during procedures as needed Date Initiated: 01/24/2016 Target Resolution Date: 03/27/2016 Goal Status: Active Patient/caregiver will verbalize adequate pain control between visits Date Initiated: 01/24/2016 Target Resolution Date: 03/27/2016 Goal Status: Active Patient/caregiver will verbalize comfort level met Date Initiated: 01/24/2016 Target Resolution Date: 03/27/2016 Goal Status: Active Interventions: Assess comfort goal upon admission Complete pain assessment as per visit requirements Notes: ` Pressure Nursing Diagnoses: Knowledge deficit related to management of pressures ulcers Potential for impaired tissue integrity related to pressure, friction, moisture, and shear Goals: Patient will remain free from development of additional pressure ulcers Date Initiated: 01/24/2016 Target Resolution Date: 03/27/2016 Goal Status: Active Interventions: Assess: immobility, friction, shearing, incontinence upon admission and as needed Assess offloading mechanisms upon admission and as needed Assess potential for pressure ulcer upon admission and as needed Provide education on pressure ulcers Notes: [name], [name] ([id number]) Wound/Skin Impairment Nursing Diagnoses: Impaired tissue integrity Goals: Ulcer/skin breakdown will have a volume reduction of 30% by week 4 Date Initiated: 01/24/2016 Target Resolution Date: 03/27/2016 Goal Status: Active Ulcer/skin breakdown will have a volume reduction of 50% by week 8 Date Initiated: 01/24/2016 Target Resolution Date: 03/27/2016 Goal Status: Active Ulcer/skin breakdown will have a volume reduction of 80% by week 12 Date Initiated: 01/24/2016 Target Resolution Date: 03/27/2016 Goal Status: Active Interventions: Assess patient/caregiver ability to perform ulcer/skin care regimen upon admission and as needed Assess ulceration(s) every visit Notes: Electronic Signature(s) Signed: 07/03/2016 4:56:51 PM By: Montey Hora Entered By: Montey Hora on 07/03/2016 14:38:26 [name], [name] ([id number]) -------------------------------------------------------------------------------- Pain Assessment Details Patient Name: [name] Coast C. Date of Service: 07/03/2016 2:00 PM Medical Record Number: [id number] Patient Account Number: [id number] Date of Birth/Sex: Aug 22, 1950 (66 y.o. Male) Treating RN: Montey Hora Primary Care Derion Kreiter: Otilio Miu Other Clinician: Referring Jolyssa Oplinger: Otilio Miu Treating Jeanean Hollett/Extender: Tito Dine in Treatment: 23 Active Problems Location of Pain Severity and Description of Pain Patient Has Paino No Site Locations Pain Management and Medication Current Pain Management: Notes Topical or injectable lidocaine is offered to patient for acute pain when surgical debridement is performed. If needed, Patient is instructed to use over the counter pain medication for the following 24-48 hours after debridement. Wound care MDs do not prescribed pain medications. Patient has chronic pain or uncontrolled pain. Patient has been instructed to make an appointment with their Primary Care Physician for pain management. Electronic Signature(s) Signed: 07/03/2016 4:56:51 PM By: Montey Hora Entered By: Montey Hora on 07/03/2016 13:56:56 Furnari, [name] ([id number]) -------------------------------------------------------------------------------- Patient/Caregiver Education Details Patient Name: [name]. Date of Service: 07/03/2016 2:00 PM Medical Record Number: [id number] Patient Account Number: [id number] Date of Birth/Gender: 1950/11/26 (66 y.o. Male) Treating RN: Montey Hora Primary Care Physician: Otilio Miu Other Clinician: Referring Physician: Otilio Miu Treating Physician/Extender: Tito Dine in Treatment: 7 Education Assessment Education Provided To: Patient and Caregiver Education Topics Provided Wound/Skin  Impairment: Handouts: Other: wound care as ordered Methods: Demonstration, Explain/Verbal Responses: State content  correctly Electronic Signature(s) Signed: 07/03/2016 4:56:51 PM By: Montey Hora Entered By: Montey Hora on 07/03/2016 14:41:01 Ciampa, [name] ([id number]) -------------------------------------------------------------------------------- Wound Assessment Details Patient Name: Kucher, Andras C. Date of Service: 07/03/2016 2:00 PM Medical Record Number: [id number] Patient Account Number: [id number] Date of Birth/Sex: [birth year] (66 y.o. Male) Treating RN: Montey Hora Primary Care Ogle Hoeffner: Otilio Miu Other Clinician: Referring Yulian Gosney: Otilio Miu Treating Shametra Cumberland/Extender: Ricard Dillon Weeks in Treatment: 23 Wound Status Wound Number: 1 Primary Pressure Ulcer Etiology: Wound Location: Right Back - Proximal Wound Open Wounding Event: Pressure Injury Status: Date Acquired: 11/24/2015 Comorbid Coronary Artery Disease, Type II Weeks Of Treatment: 23 History: Diabetes, Quadriplegia, Seizure Clustered Wound: No Disorder Photos Photo Uploaded By: Montey Hora on 07/03/2016 16:54:10 Wound Measurements Length: (cm) 2.1 Width: (cm) 1.6 Depth: (cm) 0.2 Area: (cm) 2.639 Volume: (cm) 0.528 % Reduction in Area: 67.2% % Reduction in Volume: 34.4% Epithelialization: Small (1-33%) Tunneling: No Undermining: No Wound Description Classification: Category/Stage II Foul Odor Afte Wound Margin: Flat and Intact Slough/Fibrino Exudate Amount: Medium Exudate Type: Serous Exudate Color: amber r Cleansing: No No Wound Bed Granulation Amount: Large (67-100%) Exposed Structure Granulation Quality: Red, Pink Fascia Exposed: No Necrotic Amount: None Present (0%) Fat Layer (Subcutaneous Tissue) Exposed: Yes Tendon Exposed: No Harnish, Acen C. ([id number]) Muscle Exposed: No Joint Exposed: No Bone Exposed: No Periwound Skin Texture Texture Color No  Abnormalities Noted: No No Abnormalities Noted: No Callus: No Atrophie Blanche: No Crepitus: No Cyanosis: No Excoriation: Yes Ecchymosis: No Induration: No Erythema: Yes Rash: No Erythema Location: Circumferential Scarring: No Hemosiderin Staining: No Mottled: No Moisture Pallor: No No Abnormalities Noted: No Rubor: No Dry / Scaly: No Maceration: No Temperature / Pain Temperature: No Abnormality Tenderness on Palpation: Yes Wound Preparation Ulcer Cleansing: Rinsed/Irrigated with Saline Topical Anesthetic Applied: Other: lidocaine 4%, Treatment Notes Wound #1 (Right, Proximal Back) 1. Cleansed with: Clean wound with Normal Saline 4. Dressing Applied: Aquacel Ag 5. Secondary Dressing Applied Bordered Foam Dressing Dry Gauze Electronic Signature(s) Signed: 07/03/2016 4:56:51 PM By: Montey Hora Entered By: Montey Hora on 07/03/2016 14:14:32 Grenier, [name] ([id number]) -------------------------------------------------------------------------------- Wound Assessment Details Patient Name: Karabin, Adonys C. Date of Service: 07/03/2016 2:00 PM Medical Record Number: [id number] Patient Account Number: [id number] Date of Birth/Sex: Dec 16, 1950 (66 y.o. Male) Treating RN: Montey Hora Primary Care Nadene Witherspoon: Otilio Miu Other Clinician: Referring Pepper Kerrick: Otilio Miu Treating Kass Herberger/Extender: Ricard Dillon Weeks in Treatment: 23 Wound Status Wound Number: 2 Primary Diabetic Wound/Ulcer of the Lower Etiology: Extremity Wound Location: Left Calcaneus Wound Open Wounding Event: Gradually Appeared Status: Date Acquired: 06/12/2016 Comorbid Coronary Artery Disease, Type II Weeks Of Treatment: 1 History: Diabetes, Quadriplegia, Seizure Clustered Wound: No Disorder Photos Photo Uploaded By: Montey Hora on 07/03/2016 16:53:39 Wound Measurements Length: (cm) 3 Width: (cm) 4.2 Depth: (cm) 0.1 Area: (cm) 9.896 Volume: (cm) 0.99 % Reduction in  Area: 70.2% % Reduction in Volume: 70.2% Epithelialization: None Tunneling: No Undermining: No Wound Description Classification: Grade 1 Foul Odor Af Wound Margin: Distinct, outline attached Slough/Fibri Exudate Amount: Large Exudate Type: Serosanguineous Exudate Color: red, brown ter Cleansing: No no Yes Wound Bed Granulation Amount: Medium (34-66%) Exposed Structure Granulation Quality: Red Fascia Exposed: No Necrotic Amount: Medium (34-66%) Fat Layer (Subcutaneous Tissue) Exposed: No Necrotic Quality: Eschar, Adherent Slough Tendon Exposed: No Nolet, Julian C. ([id number]) Muscle Exposed: No Joint Exposed: No Bone Exposed: No Limited to Skin Breakdown Periwound Skin Texture Texture Color No Abnormalities Noted: No No Abnormalities Noted: No Callus: No Atrophie Blanche: No Crepitus: No  Cyanosis: No Excoriation: No Ecchymosis: No Induration: No Erythema: No Rash: No Hemosiderin Staining: No Scarring: No Mottled: No Pallor: No Moisture Rubor: No No Abnormalities Noted: No Dry / Scaly: No Temperature / Pain Maceration: No Temperature: No Abnormality Tenderness on Palpation: Yes Wound Preparation Ulcer Cleansing: Rinsed/Irrigated with Saline Topical Anesthetic Applied: Other: lidocaine 4%, Treatment Notes Wound #2 (Left Calcaneus) 1. Cleansed with: Clean wound with Normal Saline 2. Anesthetic Topical Lidocaine 4% cream to wound bed prior to debridement 4. Dressing Applied: Aquacel Ag 5. Secondary Dressing Applied ABD and Kerlix/Conform 7. Secured with Recruitment consultant) Signed: 07/03/2016 4:56:51 PM By: Montey Hora Entered By: Montey Hora on 07/03/2016 14:09:51 Lasser, [name] ([id number]) -------------------------------------------------------------------------------- Wound Assessment Details Patient Name: Armes, Gunnard C. Date of Service: 07/03/2016 2:00 PM Medical Record Number: [id number] Patient Account Number: [id number] Date  of Birth/Sex: [birth year] (66 y.o. Male) Treating RN: Montey Hora Primary Care Artavius Stearns: Otilio Miu Other Clinician: Referring Chloe Bluett: Otilio Miu Treating Frisco Cordts/Extender: Ricard Dillon Weeks in Treatment: 23 Wound Status Wound Number: 3 Primary Trauma, Other Etiology: Wound Location: Right Elbow Wound Open Wounding Event: Trauma Status: Date Acquired: 06/28/2016 Comorbid Coronary Artery Disease, Type II Weeks Of Treatment: 0 History: Diabetes, Quadriplegia, Seizure Clustered Wound: No Disorder Photos Photo Uploaded By: Montey Hora on 07/03/2016 16:53:39 Wound Measurements Length: (cm) 1.8 Width: (cm) 2.1 Depth: (cm) 0.1 Area: (cm) 2.969 Volume: (cm) 0.297 % Reduction in Area: % Reduction in Volume: Epithelialization: None Tunneling: No Undermining: No Wound Description Classification: Partial Thickness Wound Margin: Distinct, outline attached Exudate Amount: Large Exudate Type: Serosanguineous Exudate Color: red, brown Foul Odor After Cleansing: No Slough/Fibrino No Wound Bed Granulation Amount: Large (67-100%) Granulation Quality: Red Necrotic Amount: None Present (0%) Zane, Sahid C. ([id number]) Periwound Skin Texture Texture Color No Abnormalities Noted: No No Abnormalities Noted: No Moisture Temperature / Pain No Abnormalities Noted: No Temperature: No Abnormality Tenderness on Palpation: Yes Wound Preparation Ulcer Cleansing: Rinsed/Irrigated with Saline Topical Anesthetic Applied: Other: lidocaine 4%, Treatment Notes Wound #3 (Right Elbow) 1. Cleansed with: Clean wound with Normal Saline 4. Dressing Applied: Aquacel Ag 5. Secondary Dressing Applied Bordered Foam Dressing Dry Gauze Electronic Signature(s) Signed: 07/03/2016 4:56:51 PM By: Montey Hora Entered By: Montey Hora on 07/03/2016 14:11:24 Sproule, [name] ([id number]) -------------------------------------------------------------------------------- Vitals  Details Patient Name: [name] Coast C. Date of Service: 07/03/2016 2:00 PM Medical Record Number: [id number] Patient Account Number: [id number] Date of Birth/Sex: 1951/01/29 (66 y.o. Male) Treating RN: Montey Hora Primary Care Lacy Sofia: Otilio Miu Other Clinician: Referring Onyx Edgley: Otilio Miu Treating Kerry Chisolm/Extender: Ricard Dillon Weeks in Treatment: 23 Vital Signs Time Taken: 13:57 Pulse (bpm): 81 Height (in): 70 Respiratory Rate (breaths/min): 16 Weight (lbs): 187 Blood Pressure (mmHg): 84/49 Body Mass Index (BMI): 26.8 Reference Range: 80 - 120 mg / dl Electronic Signature(s) Signed: 07/03/2016 4:56:51 PM By: Montey Hora Entered By: Montey Hora on 07/03/2016 13:59:50

## 2016-07-04 NOTE — Telephone Encounter (Signed)
Returned call to Olusteehristy and lvm regarding orders.

## 2016-07-05 DIAGNOSIS — Z993 Dependence on wheelchair: Secondary | ICD-10-CM | POA: Diagnosis not present

## 2016-07-05 DIAGNOSIS — L89113 Pressure ulcer of right upper back, stage 3: Secondary | ICD-10-CM | POA: Diagnosis not present

## 2016-07-05 DIAGNOSIS — Z466 Encounter for fitting and adjustment of urinary device: Secondary | ICD-10-CM | POA: Diagnosis not present

## 2016-07-05 DIAGNOSIS — G8250 Quadriplegia, unspecified: Secondary | ICD-10-CM | POA: Diagnosis not present

## 2016-07-05 DIAGNOSIS — L89623 Pressure ulcer of left heel, stage 3: Secondary | ICD-10-CM | POA: Diagnosis not present

## 2016-07-05 DIAGNOSIS — Z9181 History of falling: Secondary | ICD-10-CM | POA: Diagnosis not present

## 2016-07-05 NOTE — Progress Notes (Signed)
[name] ([id number]) Visit Report for 07/03/2016 Chief Complaint Document Details Patient Name: [name], [name]. Date of Service: 07/03/2016 2:00 PM Medical Record Number: [id number] Patient Account Number: [id number] Date of Birth/Sex: 12-28-1950 (66 y.o. Male) Treating RN: Curtis Sites Primary Care Provider: Elizabeth Sauer Other Clinician: Referring Provider: Elizabeth Sauer Treating Provider/Extender: Maxwell Caul Weeks in Treatment: 23 Information Obtained from: Patient Chief Complaint 01/24/16 patient arrives today for review of a pressure ulcer on his right scapula in the setting of incomplete C3-C4 quadriplegia Electronic Signature(s) Signed: 07/04/2016 12:30:45 PM By: Baltazar Najjar MD Entered By: Baltazar Najjar on 07/03/2016 17:06:17 [name], [name] ([id number]) -------------------------------------------------------------------------------- HPI Details Patient Name: [name] Bras C. Date of Service: 07/03/2016 2:00 PM Medical Record Number: [id number] Patient Account Number: [id number] Date of Birth/Sex: Sep 10, 1950 (66 y.o. 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 He has lower extremity sensation and can move his legs but has no/limited control over his arms. His wife accompanies him today and states that in the late spring or early summer of 2017 the patient became very depressed. He refused the refused to mobilize and he developed several pressure ulcers on his back. Most of these have healed however they have a recalcitrant area over the right scapula. They've been using Santyl on this for at least the last month. In terms of  depression the patient is doing better now on an antidepressant. He saw his primary physician on 01/12/16 at which time there was apparently green drainage coming out of this area [Dr. Deana [name]]. Dr. Yetta Barre works in the [facility]. He has completed this Septra. Otherwise looking through [facility] notes that he has a history of seizures. He also had a stroke in 2008 he follows with neurology. He also has type 2 diabetes on Glucophage, hyperlipidemia and gastroesophageal reflux. He takes Plavix for stroke prevention and Keppra for seizure prophylaxis. 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 02/08/16; this is a patient with a pressure ulcer over the right scapula. His wife has been doing the dressing with Santyl and border foam change every second day. When he arrived here 2 weeks ago we did a fairly extensive mechanical debridement. We are asked medical modalities to go out to the home and see what they might be eligible for in terms of pressure-relief surfaces [level 2] but the wife states that they have not heard from them. 03/20/15; this is a patient we haven't seen in 5-6 weeks. This was largely due to transportation issues. They've been using Santyl and border foam changing every second day for a pressure injury over the right scapula. The patient has a C3-C4 spinal injury. We had also asked for a review by the people who supplied DME to look at his wheelchair cushion, mattress etc. I don't know that this ever happened. In the meantime the wound has done remarkably well current measurements 1.8 x 2 x 0.1 04/03/16; the patient's dimensions have gone up to 3 cm in diameter quite a deterioration from last time. He is also complaining of pain in this area which is new. 04/10/16; 3 x 1.5 x 0.1.  No difference from last week. Culture I did last week was negative he has completed antibiotics. 04/24/16  2.4 x 2 x 0.1; wound generally looks smaller. Middle area that I had to debrided last time looks healthier. His son is fashioned a large piece of foam cut out where the patient's wound with hit the back of his wheelchair 05/01/16; wound is a same size however the surface of this looks better. The middle innkeeper area required a repeat debridement 05/15/16; wound is down and dimensions granulation still looks healthy. The medial aspect of this wound is now the deeper Divot. We'll see how this responds to further healing. We're using Hydrofera Blue 06/05/16; Wound 1.7x1.1x0.1 still using hyudrofera blue 06/26/16; the patient arrives back in clinic after a three-week hiatus. He was hospitalized from 06/09/16 through 06/10/16. He was found to be confused. CT scan of the head showed nothing really acute. His sodium was [name], [name] ([id number]) low at 131. He was rehydrated. He was felt to have a UTI and given antibiotics and antibiotics at discharge although his final culture result only showed multiple organisms. His wife says today that at the time of the hospitalization they discovered a large intact blister over the large aspect of his left calcaneus. This is recently ruptured. The wound on his right scapula is somewhat larger. More his wife is concerned about his current status. She states that he is still confused sleeps for long periods. He is not having fever chills cough or diarrhea [1 loose bowel movement per day]. He continues to have a suprapubic catheter. She notes that he is not eating and drinking well. Patient states he just does not want to eat. He does not feel nauseated or vomit. 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 Admission sodium was 131 at discharge 139 BUN 22 and 1.22 at admission, 17 and 1 at discharge. 07/03/16; patient's mental status is back to normal. He has a new wound on the right  elbow caused by traumatizing his elbow against the wall apparently at the dermatologist office last week. We continue with the original wound on the right scapula and the wound from 2 weeks ago on his left heel. We have been using silver alginate to the area on the heel Electronic Signature(s) Signed: 07/04/2016 12:30:45 PM By: Baltazar Najjar MD Entered By: Baltazar Najjar on 07/03/2016 17:07:27 [name], [name] ([id number]) -------------------------------------------------------------------------------- Physical Exam Details Patient Name: Hou, Norrin C. Date of Service: 07/03/2016 2:00 PM Medical Record Number: [id number] Patient Account Number: [id number] Date of Birth/Sex: 08-28-1950 (66 y.o. Male) Treating RN: Curtis Sites Primary Care Provider: Elizabeth Sauer Other Clinician: Referring Provider: Elizabeth Sauer Treating Provider/Extender: Maxwell Caul Weeks in Treatment: 53 Constitutional Patient is hypotensive. However he looks stable. Pulse regular and within target range for patient.Marland Kitchen Respirations regular, non-labored and within target range.. Temperature is normal and within the target range for the patient.. Patient's appearance is neat and clean. Appears in no acute distress. Well nourished and well developed.Marland Kitchen Respiratory Respiratory effort is easy and symmetric bilaterally. Rate is normal at rest and on room air.. Bilateral breath sounds are clear and equal in all lobes with no wheezes, rales or rhonchi.. Cardiovascular Pedal pulses palpable and strong bilaterally.. Lymphatic None palpable in the left popliteal ring on until area, epitrochlear or right axilla. Psychiatric Peers to be at his baseline. Notes Wound exam; pressure area over the right scapula is superficil  and appears stable and smaller. oThe area over the left heel is substantial but appears to be stable from a tissue surface point of view. oA new wound on the right elbow over the olecranon bursa. This  was bleeding profusely [patient on Plavix] hemostasis with silver nitrate. I did not debridement this area Electronic Signature(s) Signed: 07/04/2016 12:30:45 PM By: Baltazar Najjarobson, Michael MD Entered By: Baltazar Najjarobson, Michael on 07/03/2016 17:09:34 [name], [name] BienICHARD C. ([id number]) -------------------------------------------------------------------------------- Physician Orders Details Patient Name: [name] BrasHATCH, Neo C. Date of Service: 07/03/2016 2:00 PM Medical Record Number: [id number] Patient Account Number: [id number] Date of Birth/Sex: 06-Sep-1950 5(66 y.o. Male) Treating RN: Curtis Sitesorthy, Joanna Primary Care Provider: Elizabeth SauerJones, Deanna Other Clinician: Referring Provider: Elizabeth SauerJones, Deanna Treating Provider/Extender: Altamese CarolinaOBSON, MICHAEL G Weeks in Treatment: 2023 Verbal / Phone Orders: No Diagnosis Coding Wound Cleansing Wound #1 Right,Proximal Back o Clean wound with Normal Saline. o Cleanse wound with mild soap and water Wound #2 Left Calcaneus o Clean wound with Normal Saline. o Cleanse wound with mild soap and water Wound #3 Right Elbow o Clean wound with Normal Saline. o Cleanse wound with mild soap and water Anesthetic Wound #1 Right,Proximal Back o Topical Lidocaine 4% cream applied to wound bed prior to debridement - CLINIC USE Wound #2 Left Calcaneus o Topical Lidocaine 4% cream applied to wound bed prior to debridement - CLINIC USE Wound #3 Right Elbow o Topical Lidocaine 4% cream applied to wound bed prior to debridement - CLINIC USE Skin Barriers/Peri-Wound Care Wound #1 Right,Proximal Back o Skin Prep Wound #2 Left Calcaneus o Skin Prep Wound #3 Right Elbow o Skin Prep Primary Wound Dressing Wound #1 Right,Proximal Back o Aquacel Ag Glassberg, [name] ([id number]) Wound #2 Left Calcaneus o Aquacel Ag Wound #3 Right Elbow o Aquacel Ag Secondary Dressing Wound #1 Right,Proximal Back o Dry Gauze o Boardered Foam Dressing Wound #2 Left Calcaneus o ABD and  Kerlix/Conform Wound #3 Right Elbow o Dry Gauze o Boardered Foam Dressing Dressing Change Frequency Wound #1 Right,Proximal Back o Change dressing every other day. Wound #2 Left Calcaneus o Change dressing every other day. Wound #3 Right Elbow o Change dressing every other day. Follow-up Appointments Wound #1 Right,Proximal Back o Return Appointment in 1 week. Wound #2 Left Calcaneus o Return Appointment in 1 week. Wound #3 Right Elbow o Return Appointment in 1 week. Off-Loading Wound #1 Right,Proximal Back o Mattress o Turn and reposition every 2 hours o Other: - Pad for wheelchair for positioning Wound #2 Left Calcaneus o Mattress o Turn and reposition every 2 hours [name], [name] ([id number]) o Other: - Pad for wheelchair for positioning Wound #3 Right Elbow o Mattress o Turn and reposition every 2 hours o Other: - Pad for wheelchair for positioning Additional Orders / Instructions Wound #1 Right,Proximal Back o Increase protein intake. Wound #2 Left Calcaneus o Increase protein intake. Wound #3 Right Elbow o Increase protein intake. Home Health Wound #1 Right,Proximal Back o Continue Home Health Visits o Home Health Nurse may visit PRN to address patientos wound care needs. o FACE TO FACE ENCOUNTER: MEDICARE and MEDICAID PATIENTS: I certify that this patient is under my care and that I had a face-to-face encounter that meets the physician face-to-face encounter requirements with this patient on this date. The encounter with the patient was in whole or in part for the following MEDICAL CONDITION: (primary reason for Home Healthcare) MEDICAL NECESSITY: I certify, that based on my findings, NURSING services are a medically necessary home health service. HOME BOUND STATUS:  I certify that my clinical findings support that this patient is homebound (i.e., Due to illness or injury, pt requires aid of supportive devices  such as crutches, cane, wheelchairs, walkers, the use of special transportation or the assistance of another person to leave their place of residence. There is a normal inability to leave the home and doing so requires considerable and taxing effort. Other absences are for medical reasons / religious services and are infrequent or of short duration when for other reasons). o If current dressing causes regression in wound condition, may D/C ordered dressing product/s and apply Normal Saline Moist Dressing daily until next Wound Healing Center / Other MD appointment. Notify Wound Healing Center of regression in wound condition at [phone]. o Please direct any NON-WOUND related issues/requests for orders to patient's Primary Care Physician Wound #2 Left Calcaneus o Continue Home Health Visits o Home Health Nurse may visit PRN to address patientos wound care needs. o FACE TO FACE ENCOUNTER: MEDICARE and MEDICAID PATIENTS: I certify that this patient is under my care and that I had a face-to-face encounter that meets the physician face-to-face encounter requirements with this patient on this date. The encounter with the patient was in whole or in part for the following MEDICAL CONDITION: (primary reason for Home Healthcare) MEDICAL NECESSITY: I certify, that based on my findings, NURSING services are a medically necessary home health service. HOME BOUND STATUS: I certify that my clinical findings support that this patient is homebound (i.e., Due to illness or injury, pt requires aid of Matus, GRAYDON FOFANA. ([id number]) supportive devices such as crutches, cane, wheelchairs, walkers, the use of special transportation or the assistance of another person to leave their place of residence. There is a normal inability to leave the home and doing so requires considerable and taxing effort. Other absences are for medical reasons / religious services and are infrequent or of short duration when  for other reasons). o If current dressing causes regression in wound condition, may D/C ordered dressing product/s and apply Normal Saline Moist Dressing daily until next Wound Healing Center / Other MD appointment. Notify Wound Healing Center of regression in wound condition at [phone]. o Please direct any NON-WOUND related issues/requests for orders to patient's Primary Care Physician Wound #3 Right Elbow o Continue Home Health Visits o Home Health Nurse may visit PRN to address patientos wound care needs. o FACE TO FACE ENCOUNTER: MEDICARE and MEDICAID PATIENTS: I certify that this patient is under my care and that I had a face-to-face encounter that meets the physician face-to-face encounter requirements with this patient on this date. The encounter with the patient was in whole or in part for the following MEDICAL CONDITION: (primary reason for Home Healthcare) MEDICAL NECESSITY: I certify, that based on my findings, NURSING services are a medically necessary home health service. HOME BOUND STATUS: I certify that my clinical findings support that this patient is homebound (i.e., Due to illness or injury, pt requires aid of supportive devices such as crutches, cane, wheelchairs, walkers, the use of special transportation or the assistance of another person to leave their place of residence. There is a normal inability to leave the home and doing so requires considerable and taxing effort. Other absences are for medical reasons / religious services and are infrequent or of short duration when for other reasons). o If current dressing causes regression in wound condition, may D/C ordered dressing product/s and apply Normal Saline Moist Dressing daily until next Wound Healing Center / Other  MD appointment. Notify Wound Healing Center of regression in wound condition at [phone]. o Please direct any NON-WOUND related issues/requests for orders to patient's Primary  Care Physician Medications-please add to medication list. Wound #1 Right,Proximal Back o Other: - VITAMIN C, ZINC, VIt A, MVI Wound #2 Left Calcaneus o Other: - VITAMIN C, ZINC, VIt A, MVI Wound #3 Right Elbow o Other: - VITAMIN C, ZINC, VIt A, MVI Electronic Signature(s) Signed: 07/03/2016 4:56:51 PM By: Curtis Sites Signed: 07/04/2016 12:30:45 PM By: Baltazar Najjar MD Entered By: Curtis Sites on 07/03/2016 14:55:10 [name], [name] ([id number]) [name], [name] ([id number]) -------------------------------------------------------------------------------- Problem List Details Patient Name: CAMRIN, GEARHEART C. Date of Service: 07/03/2016 2:00 PM Medical Record Number: [id number] Patient Account Number: [id number] Date of Birth/Sex: [birth year] (66 y.o. Male) Treating RN: Curtis Sites Primary Care Provider: Elizabeth Sauer Other Clinician: Referring Provider: Elizabeth Sauer Treating Provider/Extender: Maxwell Caul Weeks in Treatment: 23 Active Problems ICD-10 Encounter Code Description Active Date Diagnosis 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, stage 3 01/24/2016 Yes S14.103S Unspecified injury at C3 level of cervical spinal cord, 01/24/2016 Yes sequela G40.102 Pressure ulcer of left heel, stage 2 06/26/2016 Yes S51.011A Laceration without foreign body of right elbow, initial 07/03/2016 Yes encounter Inactive Problems Resolved Problems Electronic Signature(s) Signed: 07/04/2016 12:30:45 PM By: Baltazar Najjar MD Entered By: Baltazar Najjar on 07/03/2016 17:05:52 [name], [name] ([id number]) -------------------------------------------------------------------------------- Progress Note Details Patient Name: [name] Bras C. Date of Service: 07/03/2016 2:00 PM Medical Record Number: [id number] Patient Account Number: [id number] Date of Birth/Sex: [birth year] (66 y.o. 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 He has lower extremity sensation and can move his legs but has no/limited control over his arms. His wife accompanies him today and states that in the late spring or early summer of 2017 the patient became very depressed. He refused the refused to mobilize and he developed several pressure ulcers on his back. Most of these have healed however they have a recalcitrant area over the right scapula. They've been using Santyl on this for at least the last month. In terms of depression the patient is doing better now on an antidepressant. He saw his primary physician on 01/12/16 at which time there was apparently green drainage coming out of this area [Dr. Deana [name]]. Dr. Yetta Barre works in the [facility]. He has completed this Septra. Otherwise looking [facility] notes that he has a history of seizures. He also had a stroke in 2008 he follows with neurology. He also has type 2 diabetes on Glucophage, hyperlipidemia and gastroesophageal reflux. He takes Plavix for stroke prevention and Keppra for seizure prophylaxis. 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 02/08/16; this is a patient with a pressure ulcer over the right scapula. His wife has been doing the dressing with Santyl and border foam change every second day. When he arrived here 2 weeks ago we did a fairly extensive mechanical debridement. We are asked medical modalities to  go out to the  home and see what they might be eligible for in terms of pressure-relief surfaces [level 2] but the wife states that they have not heard from them. 03/20/15; this is a patient we haven't seen in 5-6 weeks. This was largely due to transportation issues. They've been using Santyl and border foam changing every second day for a pressure injury over the right scapula. The patient has a C3-C4 spinal injury. We had also asked for a review by the people who supplied DME to look at his wheelchair cushion, mattress etc. I don't know that this ever happened. In the meantime the wound has done remarkably well current measurements 1.8 x 2 x 0.1 04/03/16; the patient's dimensions have gone up to 3 cm in diameter quite a deterioration from last time. He is also complaining of pain in this area which is new. 04/10/16; 3 x 1.5 x 0.1. No difference from last week. Culture I did last week was negative he has completed antibiotics. 04/24/16 2.4 x 2 x 0.1; wound generally looks smaller. Middle area that I had to debrided last time looks healthier. His son is fashioned a large piece of foam cut out where the patient's wound with hit the back of his wheelchair 05/01/16; wound is a same size however the surface of this looks better. The middle innkeeper area required [facility], JAHZION BROGDEN. ([id number]) a repeat debridement 05/15/16; wound is down and dimensions granulation still looks healthy. The medial aspect of this wound is now the deeper Divot. We'll see how this responds to further healing. We're using Hydrofera Blue 06/05/16; Wound 1.7x1.1x0.1 still using hyudrofera blue 06/26/16; the patient arrives back in clinic after a three-week hiatus. He was hospitalized from 06/09/16 through 06/10/16. He was found to be confused. CT scan of the head showed nothing really acute. His sodium was low at 131. He was rehydrated. He was felt to have a UTI and given antibiotics and antibiotics at discharge although his final culture result  only showed multiple organisms. His wife says today that at the time of the hospitalization they discovered a large intact blister over the large aspect of his left calcaneus. This is recently ruptured. The wound on his right scapula is somewhat larger. More his wife is concerned about his current status. She states that he is still confused sleeps for long periods. He is not having fever chills cough or diarrhea [1 loose bowel movement per day]. He continues to have a suprapubic catheter. She notes that he is not eating and drinking well. Patient states he just does not want to eat. He does not feel nauseated or vomit. 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 Admission sodium was 131 at discharge 139 BUN 22 and 1.22 at admission, 17 and 1 at discharge. 07/03/16; patient's mental status is back to normal. He has a new wound on the right elbow caused by traumatizing his elbow against the wall apparently at the dermatologist office last week. We continue with the original wound on the right scapula and the wound from 2 weeks ago on his left heel. We have been using silver alginate to the area on the heel Objective Constitutional Patient is hypotensive. However he looks stable. Pulse regular and within target range for patient.Marland Kitchen Respirations regular, non-labored and within target range.. Temperature is normal and within the target range for the patient.. Patient's appearance is neat and clean. Appears in  no acute distress. Well nourished and well developed.. Vitals Time Taken: 1:57 PM, Height: 70 in, Weight: 187 lbs, BMI: 26.8, Pulse: 81 bpm, Respiratory Rate: 16 breaths/min, Blood Pressure: 84/49 mmHg. Respiratory Respiratory effort is easy and symmetric bilaterally. Rate is normal at rest and on room air.. Bilateral breath sounds are clear and equal in all lobes with no wheezes, rales or  rhonchi.. Cardiovascular Pedal pulses palpable and strong bilaterally.. Lymphatic None palpable in the left popliteal ring on until area, epitrochlear or right axilla. EJ, PINSON ([id number]) Psychiatric Peers to be at his baseline. General Notes: Wound exam; pressure area over the right scapula is superficil and appears stable and smaller. The area over the left heel is substantial but appears to be stable from a tissue surface point of view. A new wound on the right elbow over the olecranon bursa. This was bleeding profusely [patient on Plavix] hemostasis with silver nitrate. I did not debridement this area Integumentary (Hair, Skin) Wound #1 status is Open. Original cause of wound was Pressure Injury. The wound is located on the Right,Proximal Back. The wound measures 2.1cm length x 1.6cm width x 0.2cm depth; 2.639cm^2 area and 0.528cm^3 volume. There is Fat Layer (Subcutaneous Tissue) Exposed exposed. There is no tunneling or undermining noted. There is a medium amount of serous drainage noted. The wound margin is flat and intact. There is large (67-100%) red, pink granulation within the wound bed. There is no necrotic tissue within the wound bed. The periwound skin appearance exhibited: Excoriation, Erythema. The periwound skin appearance did not exhibit: Callus, Crepitus, Induration, Rash, Scarring, Dry/Scaly, Maceration, Atrophie Blanche, Cyanosis, Ecchymosis, Hemosiderin Staining, Mottled, Pallor, Rubor. The surrounding wound skin color is noted with erythema which is circumferential. Periwound temperature was noted as No Abnormality. The periwound has tenderness on palpation. Wound #2 status is Open. Original cause of wound was Gradually Appeared. The wound is located on the Left Calcaneus. The wound measures 3cm length x 4.2cm width x 0.1cm depth; 9.896cm^2 area and 0.99cm^3 volume. The wound is limited to skin breakdown. There is no tunneling or undermining noted. There is  a large amount of serosanguineous drainage noted. The wound margin is distinct with the outline attached to the wound base. There is medium (34-66%) red granulation within the wound bed. There is a medium (34-66%) amount of necrotic tissue within the wound bed including Eschar and Adherent Slough. The periwound skin appearance did not exhibit: Callus, Crepitus, Excoriation, Induration, Rash, Scarring, Dry/Scaly, Maceration, Atrophie Blanche, Cyanosis, Ecchymosis, Hemosiderin Staining, Mottled, Pallor, Rubor, Erythema. Periwound temperature was noted as No Abnormality. The periwound has tenderness on palpation. Wound #3 status is Open. Original cause of wound was Trauma. The wound is located on the Right Elbow. The wound measures 1.8cm length x 2.1cm width x 0.1cm depth; 2.969cm^2 area and 0.297cm^3 volume. There is no tunneling or undermining noted. There is a large amount of serosanguineous drainage noted. The wound margin is distinct with the outline attached to the wound base. There is large (67-100%) red granulation within the wound bed. There is no necrotic tissue within the wound bed. Periwound temperature was noted as No Abnormality. The periwound has tenderness on palpation. 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 ([id number]) S51.011A - Laceration without foreign body of right elbow, initial encounter Plan Wound Cleansing: Wound #1 Right,Proximal Back:  Clean wound with Normal Saline. Cleanse wound with mild soap and water Wound #2 Left Calcaneus: Clean wound with Normal Saline. Cleanse wound with mild soap and water Wound #3 Right Elbow: Clean wound with Normal Saline. Cleanse wound with mild soap and water Anesthetic: Wound #1 Right,Proximal Back: Topical Lidocaine 4% cream applied to wound bed  prior to debridement - CLINIC USE Wound #2 Left Calcaneus: Topical Lidocaine 4% cream applied to wound bed prior to debridement - CLINIC USE Wound #3 Right Elbow: Topical Lidocaine 4% cream applied to wound bed prior to debridement - CLINIC USE Skin Barriers/Peri-Wound Care: Wound #1 Right,Proximal Back: Skin Prep Wound #2 Left Calcaneus: Skin Prep Wound #3 Right Elbow: Skin Prep Primary Wound Dressing: Wound #1 Right,Proximal Back: Aquacel Ag Wound #2 Left Calcaneus: Aquacel Ag Wound #3 Right Elbow: Aquacel Ag Secondary Dressing: Wound #1 Right,Proximal Back: Dry Gauze Boardered Foam Dressing Wound #2 Left Calcaneus: ABD and Kerlix/Conform Wound #3 Right Elbow: Dry Gauze Boardered Foam Dressing Dressing Change Frequency: [name], [name] ([id number]) Wound #1 Right,Proximal Back: Change dressing every other day. Wound #2 Left Calcaneus: Change dressing every other day. Wound #3 Right Elbow: Change dressing every other day. Follow-up Appointments: Wound #1 Right,Proximal Back: Return Appointment in 1 week. Wound #2 Left Calcaneus: Return Appointment in 1 week. Wound #3 Right Elbow: Return Appointment in 1 week. Off-Loading: Wound #1 Right,Proximal Back: Mattress Turn and reposition every 2 hours Other: - Pad for wheelchair for positioning Wound #2 Left Calcaneus: Mattress Turn and reposition every 2 hours Other: - Pad for wheelchair for positioning Wound #3 Right Elbow: Mattress Turn and reposition every 2 hours Other: - Pad for wheelchair for positioning Additional Orders / Instructions: Wound #1 Right,Proximal Back: Increase protein intake. Wound #2 Left Calcaneus: Increase protein intake. Wound #3 Right Elbow: Increase protein intake. Home Health: Wound #1 Right,Proximal Back: Continue Home Health Visits Home Health Nurse may visit PRN to address patient s wound care needs. FACE TO FACE ENCOUNTER: MEDICARE and MEDICAID PATIENTS: I certify that  this patient is under my care and that I had a face-to-face encounter that meets the physician face-to-face encounter requirements with this patient on this date. The encounter with the patient was in whole or in part for the following MEDICAL CONDITION: (primary reason for Home Healthcare) MEDICAL NECESSITY: I certify, that based on my findings, NURSING services are a medically necessary home health service. HOME BOUND STATUS: I certify that my clinical findings support that this patient is homebound (i.e., Due to illness or injury, pt requires aid of supportive devices such as crutches, cane, wheelchairs, walkers, the use of special transportation or the assistance of another person to leave their place of residence. There is a normal inability to leave the home and doing so requires considerable and taxing effort. Other absences are for medical reasons / religious services and are infrequent or of short duration when for other reasons). If current dressing causes regression in wound condition, may D/C ordered dressing product/s and apply Normal Saline Moist Dressing daily until next Wound Healing Center / Other MD appointment. Notify Wound Healing Center of regression in wound condition at [phone]. Please direct any NON-WOUND related issues/requests for orders to patient's Primary Care Physician THEODOR, MUSTIN ([id number]) Wound #2 Left Calcaneus: Continue Home Health Visits Home Health Nurse may visit PRN to address patient s wound care needs. FACE TO FACE ENCOUNTER: MEDICARE and MEDICAID PATIENTS: I certify that this patient is under my care and that I had a face-to-face  encounter that meets the physician face-to-face encounter requirements with this patient on this date. The encounter with the patient was in whole or in part for the following MEDICAL CONDITION: (primary reason for Home Healthcare) MEDICAL NECESSITY: I certify, that based on my findings, NURSING services are a  medically necessary home health service. HOME BOUND STATUS: I certify that my clinical findings support that this patient is homebound (i.e., Due to illness or injury, pt requires aid of supportive devices such as crutches, cane, wheelchairs, walkers, the use of special transportation or the assistance of another person to leave their place of residence. There is a normal inability to leave the home and doing so requires considerable and taxing effort. Other absences are for medical reasons / religious services and are infrequent or of short duration when for other reasons). If current dressing causes regression in wound condition, may D/C ordered dressing product/s and apply Normal Saline Moist Dressing daily until next Wound Healing Center / Other MD appointment. Notify Wound Healing Center of regression in wound condition at [phone]. Please direct any NON-WOUND related issues/requests for orders to patient's Primary Care Physician Wound #3 Right Elbow: Continue Home Health Visits Home Health Nurse may visit PRN to address patient s wound care needs. FACE TO FACE ENCOUNTER: MEDICARE and MEDICAID PATIENTS: I certify that this patient is under my care and that I had a face-to-face encounter that meets the physician face-to-face encounter requirements with this patient on this date. The encounter with the patient was in whole or in part for the following MEDICAL CONDITION: (primary reason for Home Healthcare) MEDICAL NECESSITY: I certify, that based on my findings, NURSING services are a medically necessary home health service. HOME BOUND STATUS: I certify that my clinical findings support that this patient is homebound (i.e., Due to illness or injury, pt requires aid of supportive devices such as crutches, cane, wheelchairs, walkers, the use of special transportation or the assistance of another person to leave their place of residence. There is a normal inability to leave the home and  doing so requires considerable and taxing effort. Other absences are for medical reasons / religious services and are infrequent or of short duration when for other reasons). If current dressing causes regression in wound condition, may D/C ordered dressing product/s and apply Normal Saline Moist Dressing daily until next Wound Healing Center / Other MD appointment. Notify Wound Healing Center of regression in wound condition at [phone]. Please direct any NON-WOUND related issues/requests for orders to patient's Primary Care Physician Medications-please add to medication list.: Wound #1 Right,Proximal Back: Other: - VITAMIN C, ZINC, VIt A, MVI Wound #2 Left Calcaneus: Other: - VITAMIN C, ZINC, VIt A, MVI Wound #3 Right Elbow: Other: - VITAMIN C, ZINC, VIt A, MVI #1 we are going to use Aquacel Ag to all 3 wound areas #2 careful attention to wound dimensions next week Sconyers, Augusten C. ([id number]) #3 no debridement was necessary in any wound area. He is on Plavix and bleeds fairly freely Electronic Signature(s) Signed: 07/04/2016 12:30:45 PM By: Baltazar Najjar MD Entered By: Baltazar Najjar on 07/03/2016 17:15:03 Jobson, [name] ([id number]) -------------------------------------------------------------------------------- SuperBill Details Patient Name: [name] Bras C. Date of Service: 07/03/2016 Medical Record Number: [id number] Patient Account Number: [id number] Date of Birth/Sex: 09-15-1950 (66 y.o. Male) Treating RN: Curtis Sites Primary Care Provider: Elizabeth Sauer Other Clinician: Referring Provider: Elizabeth Sauer Treating Provider/Extender: Maxwell Caul Weeks in Treatment: 23 Diagnosis Coding ICD-10 Codes Code Description [phone] Pressure ulcer of other site, stage  3 S14.103S Unspecified injury at C3 level of cervical spinal cord, sequela L89.622 Pressure ulcer of left heel, stage 2 S51.011A Laceration without foreign body of right elbow, initial  encounter Facility Procedures CPT4 Code: 16109604 Description: 99214 - WOUND CARE VISIT-LEV 4 EST PT Modifier: Quantity: 1 Physician Procedures CPT4 Code Description: [id number] 99213 - WC PHYS LEVEL 3 - EST PT ICD-10 Description Diagnosis 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

## 2016-07-06 DIAGNOSIS — Z9181 History of falling: Secondary | ICD-10-CM | POA: Diagnosis not present

## 2016-07-06 DIAGNOSIS — G8250 Quadriplegia, unspecified: Secondary | ICD-10-CM | POA: Diagnosis not present

## 2016-07-06 DIAGNOSIS — Z466 Encounter for fitting and adjustment of urinary device: Secondary | ICD-10-CM | POA: Diagnosis not present

## 2016-07-06 DIAGNOSIS — Z993 Dependence on wheelchair: Secondary | ICD-10-CM | POA: Diagnosis not present

## 2016-07-06 DIAGNOSIS — L89623 Pressure ulcer of left heel, stage 3: Secondary | ICD-10-CM | POA: Diagnosis not present

## 2016-07-06 DIAGNOSIS — L89113 Pressure ulcer of right upper back, stage 3: Secondary | ICD-10-CM | POA: Diagnosis not present

## 2016-07-07 DIAGNOSIS — Z466 Encounter for fitting and adjustment of urinary device: Secondary | ICD-10-CM | POA: Diagnosis not present

## 2016-07-07 DIAGNOSIS — Z993 Dependence on wheelchair: Secondary | ICD-10-CM | POA: Diagnosis not present

## 2016-07-07 DIAGNOSIS — G8250 Quadriplegia, unspecified: Secondary | ICD-10-CM | POA: Diagnosis not present

## 2016-07-07 DIAGNOSIS — Z9181 History of falling: Secondary | ICD-10-CM | POA: Diagnosis not present

## 2016-07-07 DIAGNOSIS — L89113 Pressure ulcer of right upper back, stage 3: Secondary | ICD-10-CM | POA: Diagnosis not present

## 2016-07-07 DIAGNOSIS — L89623 Pressure ulcer of left heel, stage 3: Secondary | ICD-10-CM | POA: Diagnosis not present

## 2016-07-11 ENCOUNTER — Telehealth: Payer: Self-pay | Admitting: Family Medicine

## 2016-07-11 DIAGNOSIS — Z9181 History of falling: Secondary | ICD-10-CM | POA: Diagnosis not present

## 2016-07-11 DIAGNOSIS — L89603 Pressure ulcer of unspecified heel, stage 3: Secondary | ICD-10-CM | POA: Diagnosis not present

## 2016-07-11 DIAGNOSIS — Z993 Dependence on wheelchair: Secondary | ICD-10-CM | POA: Diagnosis not present

## 2016-07-11 DIAGNOSIS — L89113 Pressure ulcer of right upper back, stage 3: Secondary | ICD-10-CM | POA: Diagnosis not present

## 2016-07-11 DIAGNOSIS — L89623 Pressure ulcer of left heel, stage 3: Secondary | ICD-10-CM | POA: Diagnosis not present

## 2016-07-11 DIAGNOSIS — L89103 Pressure ulcer of unspecified part of back, stage 3: Secondary | ICD-10-CM | POA: Diagnosis not present

## 2016-07-11 DIAGNOSIS — G8250 Quadriplegia, unspecified: Secondary | ICD-10-CM | POA: Diagnosis not present

## 2016-07-11 DIAGNOSIS — Z466 Encounter for fitting and adjustment of urinary device: Secondary | ICD-10-CM | POA: Diagnosis not present

## 2016-07-11 NOTE — Telephone Encounter (Signed)
[place] [facility]

## 2016-07-11 NOTE — Telephone Encounter (Signed)
[name] with [facility] and is requesting verbal orders for bilateral hand splints for pt's hand contractions. Please advise. Thanks TNP

## 2016-07-12 DIAGNOSIS — L89603 Pressure ulcer of unspecified heel, stage 3: Secondary | ICD-10-CM | POA: Diagnosis not present

## 2016-07-13 DIAGNOSIS — L89113 Pressure ulcer of right upper back, stage 3: Secondary | ICD-10-CM | POA: Diagnosis not present

## 2016-07-13 DIAGNOSIS — Z9181 History of falling: Secondary | ICD-10-CM | POA: Diagnosis not present

## 2016-07-13 DIAGNOSIS — Z993 Dependence on wheelchair: Secondary | ICD-10-CM | POA: Diagnosis not present

## 2016-07-13 DIAGNOSIS — Z466 Encounter for fitting and adjustment of urinary device: Secondary | ICD-10-CM | POA: Diagnosis not present

## 2016-07-13 DIAGNOSIS — G8250 Quadriplegia, unspecified: Secondary | ICD-10-CM | POA: Diagnosis not present

## 2016-07-13 DIAGNOSIS — L89623 Pressure ulcer of left heel, stage 3: Secondary | ICD-10-CM | POA: Diagnosis not present

## 2016-07-17 ENCOUNTER — Ambulatory Visit: Payer: Medicare Other | Admitting: Internal Medicine

## 2016-07-17 DIAGNOSIS — L89623 Pressure ulcer of left heel, stage 3: Secondary | ICD-10-CM | POA: Diagnosis not present

## 2016-07-17 DIAGNOSIS — G8250 Quadriplegia, unspecified: Secondary | ICD-10-CM | POA: Diagnosis not present

## 2016-07-17 DIAGNOSIS — Z993 Dependence on wheelchair: Secondary | ICD-10-CM | POA: Diagnosis not present

## 2016-07-17 DIAGNOSIS — Z466 Encounter for fitting and adjustment of urinary device: Secondary | ICD-10-CM | POA: Diagnosis not present

## 2016-07-17 DIAGNOSIS — Z9181 History of falling: Secondary | ICD-10-CM | POA: Diagnosis not present

## 2016-07-17 DIAGNOSIS — L89113 Pressure ulcer of right upper back, stage 3: Secondary | ICD-10-CM | POA: Diagnosis not present

## 2016-07-18 DIAGNOSIS — G8250 Quadriplegia, unspecified: Secondary | ICD-10-CM | POA: Diagnosis not present

## 2016-07-18 DIAGNOSIS — L89113 Pressure ulcer of right upper back, stage 3: Secondary | ICD-10-CM | POA: Diagnosis not present

## 2016-07-18 DIAGNOSIS — Z466 Encounter for fitting and adjustment of urinary device: Secondary | ICD-10-CM | POA: Diagnosis not present

## 2016-07-18 DIAGNOSIS — L89623 Pressure ulcer of left heel, stage 3: Secondary | ICD-10-CM | POA: Diagnosis not present

## 2016-07-18 DIAGNOSIS — Z9181 History of falling: Secondary | ICD-10-CM | POA: Diagnosis not present

## 2016-07-18 DIAGNOSIS — Z993 Dependence on wheelchair: Secondary | ICD-10-CM | POA: Diagnosis not present

## 2016-07-20 DIAGNOSIS — L89113 Pressure ulcer of right upper back, stage 3: Secondary | ICD-10-CM | POA: Diagnosis not present

## 2016-07-20 DIAGNOSIS — G8250 Quadriplegia, unspecified: Secondary | ICD-10-CM | POA: Diagnosis not present

## 2016-07-20 DIAGNOSIS — Z9181 History of falling: Secondary | ICD-10-CM | POA: Diagnosis not present

## 2016-07-20 DIAGNOSIS — L89623 Pressure ulcer of left heel, stage 3: Secondary | ICD-10-CM | POA: Diagnosis not present

## 2016-07-20 DIAGNOSIS — Z466 Encounter for fitting and adjustment of urinary device: Secondary | ICD-10-CM | POA: Diagnosis not present

## 2016-07-20 DIAGNOSIS — Z993 Dependence on wheelchair: Secondary | ICD-10-CM | POA: Diagnosis not present

## 2016-07-23 DIAGNOSIS — L89113 Pressure ulcer of right upper back, stage 3: Secondary | ICD-10-CM | POA: Diagnosis not present

## 2016-07-23 DIAGNOSIS — L89623 Pressure ulcer of left heel, stage 3: Secondary | ICD-10-CM | POA: Diagnosis not present

## 2016-07-23 DIAGNOSIS — Z466 Encounter for fitting and adjustment of urinary device: Secondary | ICD-10-CM | POA: Diagnosis not present

## 2016-07-23 DIAGNOSIS — G8250 Quadriplegia, unspecified: Secondary | ICD-10-CM | POA: Diagnosis not present

## 2016-07-23 DIAGNOSIS — Z993 Dependence on wheelchair: Secondary | ICD-10-CM | POA: Diagnosis not present

## 2016-07-23 DIAGNOSIS — Z9181 History of falling: Secondary | ICD-10-CM | POA: Diagnosis not present

## 2016-07-24 ENCOUNTER — Ambulatory Visit (INDEPENDENT_AMBULATORY_CARE_PROVIDER_SITE_OTHER): Payer: Medicare Other | Admitting: Family Medicine

## 2016-07-24 ENCOUNTER — Encounter: Payer: Self-pay | Admitting: Family Medicine

## 2016-07-24 VITALS — BP 120/80 | HR 80 | Temp 97.9°F

## 2016-07-24 DIAGNOSIS — R05 Cough: Secondary | ICD-10-CM | POA: Diagnosis not present

## 2016-07-24 DIAGNOSIS — L8990 Pressure ulcer of unspecified site, unspecified stage: Secondary | ICD-10-CM | POA: Diagnosis not present

## 2016-07-24 DIAGNOSIS — Z719 Counseling, unspecified: Secondary | ICD-10-CM | POA: Diagnosis not present

## 2016-07-24 DIAGNOSIS — R059 Cough, unspecified: Secondary | ICD-10-CM

## 2016-07-24 MED ORDER — BENZONATATE 100 MG PO CAPS: 100.0000 mg | ORAL_CAPSULE | Freq: Two times a day (BID) | ORAL | 0 refills | Status: DC | PRN

## 2016-07-24 NOTE — Progress Notes (Signed)
Name: [name]   MRN: [id number]    DOB: 28-Jan-1951   Date:07/24/2016       Progress Note  Subjective  Chief Complaint  Chief Complaint  Patient presents with  . wound prevention    face to face for Air Mattress and Pressure Relief Wheelchair and sit to stand lift- pt needs mattress for wound protection and pad for wheelchair. Also needs lift to help getting off toilet to a standing position  . Cough    taking Zyrtec and robitussin- not helping    Patient needs facetoface for 1) air mattress  2) pressure air cushion for wheelchair and 3) sit to stand lift(for toilet transfer).  Patient has prolonged coughing episodes that resemble apneic features.  This has occurred since tracheostomt.  Patient also needs a speech therapy to be administered at home.      Cough  This is a new problem. The current episode started more than 1 year ago. The problem has been waxing and waning. The cough is non-productive. Pertinent negatives include no chest pain, chills, ear congestion, ear pain, fever, headaches, heartburn, hemoptysis, myalgias, nasal congestion, postnasal drip, rash, rhinorrhea, sore throat, shortness of breath, sweats, weight loss or wheezing. The symptoms are aggravated by pollens. He has tried nothing for the symptoms. The treatment provided mild relief. There is no history of asthma, bronchiectasis, bronchitis, COPD, emphysema, environmental allergies or pneumonia.    No problem-specific Assessment & Plan notes found for this encounter.   Past Medical History:  Diagnosis Date  . Allergy   . Anxiety   . CAD (coronary artery disease)   . Diabetes mellitus without complication (HCC)   . GERD (gastroesophageal reflux disease)   . Hyperlipidemia   . Quadriplegia (HCC)   . Seizures (HCC)   . Spinal cord disease (HCC)   . Stroke [facility](HCC)     Past Surgical History:  Procedure Laterality Date  . ENDARTERECTOMY    . ENDARTERECTOMY    . TRACHEOSTOMY      Family History   Problem Relation Age of Onset  . Stroke Father     Social History   Social History  . Marital status: Married    Spouse name: N/A  . Number of children: N/A  . Years of education: N/A   Occupational History  . Not on file.   Social History Main Topics  . Smoking status: Never Smoker  . Smokeless tobacco: Never Used  . Alcohol use No  . Drug use: No  . Sexual activity: Not on file   Other Topics Concern  . Not on file   Social History Narrative  . No narrative on file    No Known Allergies  Outpatient Medications Prior to Visit  Medication Sig Dispense Refill  . atorvastatin (LIPITOR) 10 MG tablet TAKE 1 TABLET BY MOUTH  EVERY DAY 90 tablet 0  . baclofen (LIORESAL) 20 MG tablet Take 1 tablet by mouth 2 (two) times daily.    . citalopram (CELEXA) 20 MG tablet TAKE 1 TABLET (20 MG TOTAL) BY MOUTH DAILY. 30 tablet 2  . clopidogrel (PLAVIX) 75 MG tablet TAKE 1 TABLET BY MOUTH  EVERY DAY 90 tablet 0  . diazepam (VALIUM) 5 MG tablet Take 5-10 mg by mouth 3 (three) times daily as needed for muscle spasms. Take 5 mg in the morning, 5 mg at noon and 10 mg every night at bedtime as needed for spasms.    . furosemide (LASIX) 20 MG tablet Take 1  tablet (20 mg total) by mouth daily. 90 tablet 1  . levETIRAcetam (KEPPRA) 500 MG tablet Take 1 tablet (500 mg total) by mouth 2 (two) times daily. (Patient taking differently: Take 500 mg by mouth 3 (three) times daily. ) 180 tablet 0  . metFORMIN (GLUCOPHAGE) 500 MG tablet TAKE 1 TABLET BY MOUTH TWO  TIMES DAILY 180 tablet 0  . mirtazapine (REMERON) 15 MG tablet Take 1 tablet by mouth daily. Dr. Carlena Hurl    . oxybutynin (DITROPAN) 5 MG tablet Take 1 tablet by mouth 3  times daily (Patient taking differently: Take 1 tablet by mouth 3  [facility] 270 tablet 0  . pantoprazole (PROTONIX) 40 MG tablet Take 1 tablet (40 mg total) by mouth daily. 90 tablet 1  . sodium chloride irrigation 0.9 % irrigation Irrigate with 1  application as directed at bedtime. Use to flush tube once daily at night.    . metFORMIN (GLUCOPHAGE) 500 MG tablet Take 1 tablet (500 mg total) by mouth 2 (two) times daily with a meal. 180 tablet 3  . metFORMIN (GLUCOPHAGE) 500 MG tablet TAKE 1 TABLET (500 MG TOTAL) BY MOUTH 2 (TWO) TIMES DAILY WITH A MEAL. (Patient not taking: Reported on 06/09/2016) 180 tablet 1   No facility-administered medications prior to visit.     Review of Systems  Constitutional: Negative for chills, fever, malaise/fatigue and weight loss.  HENT: Negative for ear discharge, ear pain, postnasal drip, rhinorrhea and sore throat.   Eyes: Negative for blurred vision.  Respiratory: Positive for cough. Negative for hemoptysis, sputum production, shortness of breath and wheezing.   Cardiovascular: Negative for chest pain, palpitations and leg swelling.  Gastrointestinal: Negative for abdominal pain, blood in stool, constipation, diarrhea, heartburn, melena and nausea.  Genitourinary: Negative for dysuria, frequency, hematuria and urgency.  Musculoskeletal: Negative for back pain, joint pain, myalgias and neck pain.  Skin: Negative for rash.  Neurological: Negative for dizziness, tingling, sensory change, focal weakness and headaches.  Endo/Heme/Allergies: Negative for environmental allergies and polydipsia. Does not bruise/bleed easily.  Psychiatric/Behavioral: Negative for depression and suicidal ideas. The patient is not nervous/anxious and does not have insomnia.      Objective  Vitals:   07/24/16 1028  BP: 120/80  Pulse: 80  Temp: 97.9 F (36.6 C)  TempSrc: Oral    Physical Exam  Constitutional: He is oriented to person, place, and time and well-developed, well-nourished, and in no distress.  HENT:  Head: Normocephalic.  Right Ear: External ear normal.  Left Ear: External ear normal.  Nose: Nose normal.  Mouth/Throat: Oropharynx is clear and moist.  Eyes: Conjunctivae and EOM are normal. Pupils are  equal, round, and reactive to light. Right eye exhibits no discharge. Left eye exhibits no discharge. No scleral icterus.  Neck: Normal range of motion. Neck supple. No JVD present. No tracheal deviation present. No thyromegaly present.  Cardiovascular: Normal rate, regular rhythm, normal heart sounds and intact distal pulses.  Exam reveals no gallop and no friction rub.   No murmur heard. Pulmonary/Chest: Breath sounds normal. No respiratory distress. He has no wheezes. He has no rales.  Abdominal: Soft. Bowel sounds are normal. He exhibits no mass. There is no hepatosplenomegaly. There is no tenderness. There is no rebound, no guarding and no CVA tenderness.  Musculoskeletal: Normal range of motion. He exhibits no edema or tenderness.  Lymphadenopathy:    He has no cervical adenopathy.  Neurological: He is alert and oriented to person, place, and  time. He has normal sensation, normal strength, normal reflexes and intact cranial nerves. No cranial nerve deficit.  Skin: Skin is warm. No rash noted.  Psychiatric: Mood and affect normal.  Nursing note and vitals reviewed.     Assessment & Plan  Problem List Items Addressed This Visit    None    Visit Diagnoses    Encounter for consultation    -  Primary   Coughing       next step speech therapy vs ENT   Relevant Medications   benzonatate (TESSALON) 100 MG capsule   Pressure ulcer, unspecified location, unspecified ulcer stage          Meds ordered this encounter  Medications  . benzonatate (TESSALON) 100 MG capsule    Sig: Take 1 capsule (100 mg total) by mouth 2 (two) times daily as needed for cough.    Dispense:  20 capsule    Refill:  0  pt needs Low Air Loss Mattress and Pressure Relief Wheelchair Cushion as well as Sit to [employer]. These items will be used in wound prevention as well as assisting with toileting and dressing with the lift.    Dr. Hayden [facility]  07/24/16

## 2016-07-25 DIAGNOSIS — Z466 Encounter for fitting and adjustment of urinary device: Secondary | ICD-10-CM | POA: Diagnosis not present

## 2016-07-25 DIAGNOSIS — L89113 Pressure ulcer of right upper back, stage 3: Secondary | ICD-10-CM | POA: Diagnosis not present

## 2016-07-25 DIAGNOSIS — Z9181 History of falling: Secondary | ICD-10-CM | POA: Diagnosis not present

## 2016-07-25 DIAGNOSIS — Z993 Dependence on wheelchair: Secondary | ICD-10-CM | POA: Diagnosis not present

## 2016-07-25 DIAGNOSIS — G8250 Quadriplegia, unspecified: Secondary | ICD-10-CM | POA: Diagnosis not present

## 2016-07-25 DIAGNOSIS — L89623 Pressure ulcer of left heel, stage 3: Secondary | ICD-10-CM | POA: Diagnosis not present

## 2016-07-30 DIAGNOSIS — Z993 Dependence on wheelchair: Secondary | ICD-10-CM | POA: Diagnosis not present

## 2016-07-30 DIAGNOSIS — Z9181 History of falling: Secondary | ICD-10-CM | POA: Diagnosis not present

## 2016-07-30 DIAGNOSIS — L89623 Pressure ulcer of left heel, stage 3: Secondary | ICD-10-CM | POA: Diagnosis not present

## 2016-07-30 DIAGNOSIS — G8250 Quadriplegia, unspecified: Secondary | ICD-10-CM | POA: Diagnosis not present

## 2016-07-30 DIAGNOSIS — L89113 Pressure ulcer of right upper back, stage 3: Secondary | ICD-10-CM | POA: Diagnosis not present

## 2016-07-30 DIAGNOSIS — Z466 Encounter for fitting and adjustment of urinary device: Secondary | ICD-10-CM | POA: Diagnosis not present

## 2016-07-31 ENCOUNTER — Other Ambulatory Visit: Payer: Self-pay

## 2016-07-31 ENCOUNTER — Other Ambulatory Visit: Payer: Self-pay | Admitting: Family Medicine

## 2016-07-31 MED ORDER — CITALOPRAM HYDROBROMIDE 40 MG PO TABS: 40.0000 mg | ORAL_TABLET | Freq: Every day | ORAL | 1 refills | Status: DC

## 2016-08-01 DIAGNOSIS — L89623 Pressure ulcer of left heel, stage 3: Secondary | ICD-10-CM | POA: Diagnosis not present

## 2016-08-01 DIAGNOSIS — L89113 Pressure ulcer of right upper back, stage 3: Secondary | ICD-10-CM | POA: Diagnosis not present

## 2016-08-01 DIAGNOSIS — G8250 Quadriplegia, unspecified: Secondary | ICD-10-CM | POA: Diagnosis not present

## 2016-08-01 DIAGNOSIS — Z9181 History of falling: Secondary | ICD-10-CM | POA: Diagnosis not present

## 2016-08-01 DIAGNOSIS — Z466 Encounter for fitting and adjustment of urinary device: Secondary | ICD-10-CM | POA: Diagnosis not present

## 2016-08-01 DIAGNOSIS — Z993 Dependence on wheelchair: Secondary | ICD-10-CM | POA: Diagnosis not present

## 2016-08-02 DIAGNOSIS — Z993 Dependence on wheelchair: Secondary | ICD-10-CM | POA: Diagnosis not present

## 2016-08-02 DIAGNOSIS — G8250 Quadriplegia, unspecified: Secondary | ICD-10-CM | POA: Diagnosis not present

## 2016-08-02 DIAGNOSIS — L89113 Pressure ulcer of right upper back, stage 3: Secondary | ICD-10-CM | POA: Diagnosis not present

## 2016-08-02 DIAGNOSIS — Z9181 History of falling: Secondary | ICD-10-CM | POA: Diagnosis not present

## 2016-08-02 DIAGNOSIS — L89623 Pressure ulcer of left heel, stage 3: Secondary | ICD-10-CM | POA: Diagnosis not present

## 2016-08-02 DIAGNOSIS — Z466 Encounter for fitting and adjustment of urinary device: Secondary | ICD-10-CM | POA: Diagnosis not present

## 2016-08-03 DIAGNOSIS — L89113 Pressure ulcer of right upper back, stage 3: Secondary | ICD-10-CM | POA: Diagnosis not present

## 2016-08-03 DIAGNOSIS — Z9181 History of falling: Secondary | ICD-10-CM | POA: Diagnosis not present

## 2016-08-03 DIAGNOSIS — Z466 Encounter for fitting and adjustment of urinary device: Secondary | ICD-10-CM | POA: Diagnosis not present

## 2016-08-03 DIAGNOSIS — L89623 Pressure ulcer of left heel, stage 3: Secondary | ICD-10-CM | POA: Diagnosis not present

## 2016-08-03 DIAGNOSIS — G8250 Quadriplegia, unspecified: Secondary | ICD-10-CM | POA: Diagnosis not present

## 2016-08-03 DIAGNOSIS — Z993 Dependence on wheelchair: Secondary | ICD-10-CM | POA: Diagnosis not present

## 2016-08-06 DIAGNOSIS — L89113 Pressure ulcer of right upper back, stage 3: Secondary | ICD-10-CM | POA: Diagnosis not present

## 2016-08-06 DIAGNOSIS — Z9181 History of falling: Secondary | ICD-10-CM | POA: Diagnosis not present

## 2016-08-06 DIAGNOSIS — L89623 Pressure ulcer of left heel, stage 3: Secondary | ICD-10-CM | POA: Diagnosis not present

## 2016-08-06 DIAGNOSIS — Z466 Encounter for fitting and adjustment of urinary device: Secondary | ICD-10-CM | POA: Diagnosis not present

## 2016-08-06 DIAGNOSIS — G8250 Quadriplegia, unspecified: Secondary | ICD-10-CM | POA: Diagnosis not present

## 2016-08-06 DIAGNOSIS — Z993 Dependence on wheelchair: Secondary | ICD-10-CM | POA: Diagnosis not present

## 2016-08-07 DIAGNOSIS — L89623 Pressure ulcer of left heel, stage 3: Secondary | ICD-10-CM | POA: Diagnosis not present

## 2016-08-07 DIAGNOSIS — L89113 Pressure ulcer of right upper back, stage 3: Secondary | ICD-10-CM | POA: Diagnosis not present

## 2016-08-07 DIAGNOSIS — Z993 Dependence on wheelchair: Secondary | ICD-10-CM | POA: Diagnosis not present

## 2016-08-07 DIAGNOSIS — G8250 Quadriplegia, unspecified: Secondary | ICD-10-CM | POA: Diagnosis not present

## 2016-08-07 DIAGNOSIS — Z466 Encounter for fitting and adjustment of urinary device: Secondary | ICD-10-CM | POA: Diagnosis not present

## 2016-08-07 DIAGNOSIS — Z9181 History of falling: Secondary | ICD-10-CM | POA: Diagnosis not present

## 2016-08-08 DIAGNOSIS — Z993 Dependence on wheelchair: Secondary | ICD-10-CM | POA: Diagnosis not present

## 2016-08-08 DIAGNOSIS — Z9181 History of falling: Secondary | ICD-10-CM | POA: Diagnosis not present

## 2016-08-08 DIAGNOSIS — G8250 Quadriplegia, unspecified: Secondary | ICD-10-CM | POA: Diagnosis not present

## 2016-08-08 DIAGNOSIS — L89623 Pressure ulcer of left heel, stage 3: Secondary | ICD-10-CM | POA: Diagnosis not present

## 2016-08-08 DIAGNOSIS — Z466 Encounter for fitting and adjustment of urinary device: Secondary | ICD-10-CM | POA: Diagnosis not present

## 2016-08-08 DIAGNOSIS — L89113 Pressure ulcer of right upper back, stage 3: Secondary | ICD-10-CM | POA: Diagnosis not present

## 2016-08-09 DIAGNOSIS — L89113 Pressure ulcer of right upper back, stage 3: Secondary | ICD-10-CM | POA: Diagnosis not present

## 2016-08-09 DIAGNOSIS — Z9181 History of falling: Secondary | ICD-10-CM | POA: Diagnosis not present

## 2016-08-09 DIAGNOSIS — Z993 Dependence on wheelchair: Secondary | ICD-10-CM | POA: Diagnosis not present

## 2016-08-09 DIAGNOSIS — Z466 Encounter for fitting and adjustment of urinary device: Secondary | ICD-10-CM | POA: Diagnosis not present

## 2016-08-09 DIAGNOSIS — L89623 Pressure ulcer of left heel, stage 3: Secondary | ICD-10-CM | POA: Diagnosis not present

## 2016-08-09 DIAGNOSIS — G8250 Quadriplegia, unspecified: Secondary | ICD-10-CM | POA: Diagnosis not present

## 2016-08-10 DIAGNOSIS — Z466 Encounter for fitting and adjustment of urinary device: Secondary | ICD-10-CM | POA: Diagnosis not present

## 2016-08-10 DIAGNOSIS — Z9181 History of falling: Secondary | ICD-10-CM | POA: Diagnosis not present

## 2016-08-10 DIAGNOSIS — Z993 Dependence on wheelchair: Secondary | ICD-10-CM | POA: Diagnosis not present

## 2016-08-10 DIAGNOSIS — L89623 Pressure ulcer of left heel, stage 3: Secondary | ICD-10-CM | POA: Diagnosis not present

## 2016-08-10 DIAGNOSIS — L89113 Pressure ulcer of right upper back, stage 3: Secondary | ICD-10-CM | POA: Diagnosis not present

## 2016-08-10 DIAGNOSIS — G8250 Quadriplegia, unspecified: Secondary | ICD-10-CM | POA: Diagnosis not present

## 2016-08-14 ENCOUNTER — Encounter: Payer: Medicare Other | Attending: Internal Medicine | Admitting: Internal Medicine

## 2016-08-14 DIAGNOSIS — F329 Major depressive disorder, single episode, unspecified: Secondary | ICD-10-CM | POA: Diagnosis not present

## 2016-08-14 DIAGNOSIS — E1165 Type 2 diabetes mellitus with hyperglycemia: Secondary | ICD-10-CM | POA: Insufficient documentation

## 2016-08-14 DIAGNOSIS — S51011A Laceration without foreign body of right elbow, initial encounter: Secondary | ICD-10-CM | POA: Insufficient documentation

## 2016-08-14 DIAGNOSIS — L89622 Pressure ulcer of left heel, stage 2: Secondary | ICD-10-CM | POA: Diagnosis present

## 2016-08-14 DIAGNOSIS — E785 Hyperlipidemia, unspecified: Secondary | ICD-10-CM | POA: Diagnosis not present

## 2016-08-14 DIAGNOSIS — G8252 Quadriplegia, C1-C4 incomplete: Secondary | ICD-10-CM | POA: Insufficient documentation

## 2016-08-14 DIAGNOSIS — R569 Unspecified convulsions: Secondary | ICD-10-CM | POA: Insufficient documentation

## 2016-08-14 DIAGNOSIS — I959 Hypotension, unspecified: Secondary | ICD-10-CM | POA: Diagnosis not present

## 2016-08-14 DIAGNOSIS — K219 Gastro-esophageal reflux disease without esophagitis: Secondary | ICD-10-CM | POA: Diagnosis not present

## 2016-08-14 DIAGNOSIS — Z8673 Personal history of transient ischemic attack (TIA), and cerebral infarction without residual deficits: Secondary | ICD-10-CM | POA: Insufficient documentation

## 2016-08-14 DIAGNOSIS — L89322 Pressure ulcer of left buttock, stage 2: Secondary | ICD-10-CM | POA: Insufficient documentation

## 2016-08-14 DIAGNOSIS — S14103S Unspecified injury at C3 level of cervical spinal cord, sequela: Secondary | ICD-10-CM | POA: Diagnosis not present

## 2016-08-14 DIAGNOSIS — L89623 Pressure ulcer of left heel, stage 3: Secondary | ICD-10-CM | POA: Diagnosis not present

## 2016-08-14 DIAGNOSIS — S31829A Unspecified open wound of left buttock, initial encounter: Secondary | ICD-10-CM | POA: Diagnosis not present

## 2016-08-14 DIAGNOSIS — Z993 Dependence on wheelchair: Secondary | ICD-10-CM | POA: Diagnosis not present

## 2016-08-14 DIAGNOSIS — Z7984 Long term (current) use of oral hypoglycemic drugs: Secondary | ICD-10-CM | POA: Diagnosis not present

## 2016-08-14 DIAGNOSIS — L89112 Pressure ulcer of right upper back, stage 2: Secondary | ICD-10-CM | POA: Diagnosis not present

## 2016-08-14 DIAGNOSIS — X58XXXS Exposure to other specified factors, sequela: Secondary | ICD-10-CM | POA: Insufficient documentation

## 2016-08-14 DIAGNOSIS — L89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, stage 3: Secondary | ICD-10-CM | POA: Diagnosis not present

## 2016-08-14 DIAGNOSIS — Z9181 History of falling: Secondary | ICD-10-CM | POA: Diagnosis not present

## 2016-08-14 DIAGNOSIS — S51001A Unspecified open wound of right elbow, initial encounter: Secondary | ICD-10-CM | POA: Diagnosis not present

## 2016-08-14 DIAGNOSIS — L89113 Pressure ulcer of right upper back, stage 3: Secondary | ICD-10-CM | POA: Diagnosis not present

## 2016-08-14 DIAGNOSIS — Z466 Encounter for fitting and adjustment of urinary device: Secondary | ICD-10-CM | POA: Diagnosis not present

## 2016-08-14 DIAGNOSIS — G8250 Quadriplegia, unspecified: Secondary | ICD-10-CM | POA: Diagnosis not present

## 2016-08-15 DIAGNOSIS — Z9181 History of falling: Secondary | ICD-10-CM | POA: Diagnosis not present

## 2016-08-15 DIAGNOSIS — G8250 Quadriplegia, unspecified: Secondary | ICD-10-CM | POA: Diagnosis not present

## 2016-08-15 DIAGNOSIS — L89623 Pressure ulcer of left heel, stage 3: Secondary | ICD-10-CM | POA: Diagnosis not present

## 2016-08-15 DIAGNOSIS — Z466 Encounter for fitting and adjustment of urinary device: Secondary | ICD-10-CM | POA: Diagnosis not present

## 2016-08-15 DIAGNOSIS — Z993 Dependence on wheelchair: Secondary | ICD-10-CM | POA: Diagnosis not present

## 2016-08-15 DIAGNOSIS — L89113 Pressure ulcer of right upper back, stage 3: Secondary | ICD-10-CM | POA: Diagnosis not present

## 2016-08-16 NOTE — Progress Notes (Signed)
EMERY, DUPUY ([id number]) Visit Report for 08/14/2016 Chief Complaint Document Details Patient Name: DAREAN, [name]. Date of Service: 08/14/2016 3:00 PM Medical Record Number: [id number] Patient Account Number: [id number] Date of Birth/Sex: 08/16/1950 (66 y.o. Male) Treating RN: Huel Coventry Primary Care Provider: Elizabeth Sauer Other Clinician: Referring Provider: Elizabeth Sauer Treating Provider/Extender: Maxwell Caul Weeks in Treatment: 29 Information Obtained from: Patient Chief Complaint 01/24/16 patient arrives today for review of a pressure ulcer on his right scapula in the setting of incomplete C3-C4 quadriplegia Electronic Signature(s) Signed: 08/14/2016 4:47:51 PM By: Baltazar Najjar MD Entered By: Baltazar Najjar on 08/14/2016 15:27:39 Leys, [name] ([id number]) -------------------------------------------------------------------------------- Debridement Details Patient Name: [name] Bras C. Date of Service: 08/14/2016 3:00 PM Medical Record Number: [id number] Patient Account Number: [id number] Date of Birth/Sex: [birth year] (66 y.o. Male) Treating RN: Huel Coventry Primary Care Provider: Elizabeth Sauer Other Clinician: Referring Provider: Elizabeth Sauer Treating Provider/Extender: Altamese [place] in Treatment: 29 Debridement Performed for Wound #1 Right,Proximal Back Assessment: Performed By: Physician Maxwell Caul, MD Debridement: Debridement Pre-procedure Verification/Time Out Yes - 15:10 Taken: Start Time: 15:11 Pain Control: Other : lidocaine 4% Level: Skin/Subcutaneous Tissue Total Area Debrided (L x 2.1 (cm) x 1.4 (cm) = 2.94 (cm) W): Tissue and other Viable, Non-Viable, Fibrin/Slough, Subcutaneous material debrided: Instrument: Curette Bleeding: Minimum Hemostasis Achieved: Pressure End Time: 15:17 Procedural Pain: 0 Post Procedural Pain: 0 Response to Treatment: Procedure was tolerated well Post Debridement Measurements of  Total Wound Length: (cm) 2.1 Stage: Category/Stage II Width: (cm) 1.4 Depth: (cm) 0.1 Volume: (cm) 0.231 Character of Wound/Ulcer Post Requires Further Debridement: Debridement Post Procedure Diagnosis Same as Pre-procedure Electronic Signature(s) Signed: 08/14/2016 4:47:51 PM By: Baltazar Najjar MD Signed: 08/15/2016 8:26:41 AM By: Elliot Gurney, BSN, RN, CWS, Kim RN, BSN Entered By: Baltazar Najjar on 08/14/2016 15:27:28 [name], [name] ([id number]) -------------------------------------------------------------------------------- HPI Details Patient Name: BASHAR, MILAM C. Date of Service: 08/14/2016 3:00 PM Medical Record Number: [id number] Patient Account Number: [id number] Date of Birth/Sex: 1950/06/12 (66 y.o. 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 He has lower extremity sensation and can move his legs but has no/limited control over his arms. His wife accompanies him today and states that in the late spring or early summer of 2017 the patient became very depressed. He refused the refused to mobilize and he developed several pressure ulcers on his back. Most of these have healed however they have a recalcitrant area over the right scapula. They've been using Santyl on this for at least the last month. In terms of depression the patient is doing better now on an antidepressant. He saw his primary physician on 01/12/16 at which time there was apparently green drainage coming out of this area [Dr. Deana Jones]. Dr. Yetta Barre works in the [facility]. He has completed this Septra. Otherwise looking through [facility] notes that he  has a history of seizures. He also had a stroke in 2008 he follows with neurology. He also has type 2 diabetes on Glucophage, hyperlipidemia and gastroesophageal reflux. He takes Plavix for stroke prevention and Keppra for seizure prophylaxis. 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 02/08/16; this is a patient with a pressure ulcer over the right scapula. His wife has been doing the dressing with Santyl and border foam change every second day. When he arrived here 2 weeks ago we did a fairly extensive mechanical debridement. We are asked medical modalities to go out to the home and see what they might be eligible for in terms of pressure-relief surfaces [level 2] but the wife states that they have not heard from them. 03/20/15; this is a patient we haven't seen in 5-6 weeks. This was largely due to transportation issues. They've been using Santyl and border foam changing every second day for a pressure injury over the right scapula. The patient has a C3-C4 spinal injury. We had also asked for a review by the people who supplied DME to look at his wheelchair cushion, mattress etc. I don't know that this ever happened. In the meantime the wound has done remarkably well current measurements 1.8 x 2 x 0.1 04/03/16; the patient's dimensions have gone up to 3 cm in diameter quite a deterioration from last time. He is also complaining of pain in this area which is new. 04/10/16; 3 x 1.5 x 0.1. No difference from last week. Culture I did last week was negative he has completed antibiotics. 04/24/16 2.4 x 2 x 0.1; wound generally looks smaller. Middle area that I had to debrided last time looks healthier. His son is fashioned a large piece of foam cut out where the patient's wound with hit the back of his wheelchair 05/01/16; wound is a same size however the surface of this looks better. The middle innkeeper area required a repeat  debridement 05/15/16; wound is down and dimensions granulation still looks healthy. The medial aspect of this wound is now the deeper Divot. We'll see how this responds to further healing. We're using Hydrofera Blue 06/05/16; Wound 1.7x1.1x0.1 still using hyudrofera blue 06/26/16; the patient arrives back in clinic after a three-week hiatus. He was hospitalized from 06/09/16 through 06/10/16. He was found to be confused. CT scan of the head showed nothing really acute. His sodium was Grabski, Ilyaas C. ([id number]) low at 131. He was rehydrated. He was felt to have a UTI and given antibiotics and antibiotics at discharge although his final culture result only showed multiple organisms. His wife says today that at the time of the hospitalization they discovered a large intact blister over the large aspect of his left calcaneus. This is recently ruptured. The wound on his right scapula is somewhat larger. More his wife is concerned about his current status. She states that he is still confused sleeps for long periods. He is not having fever chills cough or diarrhea [1 loose bowel movement per day]. He continues to have a suprapubic catheter. She notes that he is not eating and drinking well. Patient states he just does not want to eat. He does not feel nauseated or vomit. 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 Admission sodium was 131 at discharge 139 BUN 22 and 1.22 at admission, 17 and 1 at discharge. 07/03/16; patient's mental status is back to normal. He has a new wound on the right elbow caused by traumatizing his elbow against the wall apparently at the dermatologist office last week. We continue with the original wound on the right scapula and the wound from 2 weeks ago on his left heel. We have been using silver alginate  to the area on the heel. 08/14/16; patient has not been here in almost 6 weeks. When he was  here last time he had the pressure ulcer on the right scapula, atraumatic wound on the right lateral elbow and an area on his left heel. His wife states that at one point all of these were healed and she didn't really feel he needed to come back here. Since then the patient has developed a reopening of the area on the right scapula, a stage II wound on the left buttock and a reopening of the area on the right lateral elbow. As usual his wife as a litany of complaints against home health, she is dismissing or is going to dismiss well care. She tells [place] she has a long list of supplies at home already for some reason they were not felt to be eligible for a group 2 or 3 surface although they have a [name] bed at home but no offloading surface Electronic Signature(s) Signed: 08/14/2016 4:47:51 PM By: Baltazar Najjar MD Entered By: Baltazar Najjar on 08/14/2016 15:30:36 [name], [name] ([id number]) -------------------------------------------------------------------------------- Physical Exam Details Patient Name: [name] Bras C. Date of Service: 08/14/2016 3:00 PM Medical Record Number: [id number] Patient Account Number: [id number] Date of Birth/Sex: [birth year] (66 y.o. Male) Treating RN: Huel Coventry Primary Care Provider: Elizabeth Sauer Other Clinician: Referring Provider: Elizabeth Sauer Treating Provider/Extender: Maxwell Caul Weeks in Treatment: 82 Constitutional Patient is hypertensive.. Pulse regular and within target range for patient.Marland Kitchen Respirations regular, non-labored and within target range.. Temperature is normal and within the target range for the patient.Marland Kitchen appears in no distress. Notes Wound exam; pressure area over the right scapula is superficial but has a nonviable surface. This required debridement with a #3 curet removing necrotic material oThe area over the left heel she would not let us see but says this is healed oA new wound over the right elbow from last time is  superficial and appears healthy oSuperficial stage II over the left buttock Electronic Signature(s) Signed: 08/14/2016 4:47:51 PM By: Baltazar Najjar MD Entered By: Baltazar Najjar on 08/14/2016 15:31:44 [name], [name] ([id number]) -------------------------------------------------------------------------------- Physician Orders Details Patient Name: [name] Bras C. Date of Service: 08/14/2016 3:00 PM Medical Record Number: [id number] Patient Account Number: [id number] Date of Birth/Sex: [birth year] (66 y.o. Male) Treating RN: Huel Coventry Primary Care Provider: Elizabeth Sauer Other Clinician: Referring Provider: Elizabeth Sauer Treating Provider/Extender: Altamese [place] in Treatment: 63 Verbal / Phone Orders: No Diagnosis Coding Wound Cleansing Wound #1 Right,Proximal Back o Clean wound with Normal Saline. Wound #3 Right Elbow o Clean wound with Normal Saline. Wound #4 Left Gluteus o Clean wound with Normal Saline. Anesthetic Wound #1 Right,Proximal Back o Topical Lidocaine 4% cream applied to wound bed prior to debridement Wound #3 Right Elbow o Topical Lidocaine 4% cream applied to wound bed prior to debridement Wound #4 Left Gluteus o Topical Lidocaine 4% cream applied to wound bed prior to debridement Primary Wound Dressing Wound #1 Right,Proximal Back o Hydrafera Blue Wound #3 Right Elbow o Hydrafera Blue Wound #4 Left Gluteus o Hydrafera Blue Secondary Dressing Wound #1 Right,Proximal Back o Boardered Foam Dressing Wound #3 Right Elbow o Boardered Foam Dressing Stangl, Lanell C. ([id number]) Wound #4 Left Gluteus o Boardered Foam Dressing Dressing Change Frequency Wound #1 Right,Proximal Back o Change dressing every other day. Wound #3 Right Elbow o Change dressing every other day. Wound #4 Left Gluteus o Change dressing every other day. Follow-up Appointments Wound #1 Right,Proximal Back o Return  Appointment in 2  weeks. Wound #3 Right Elbow o Return Appointment in 2 weeks. Wound #4 Left Gluteus o Return Appointment in 2 weeks. Off-Loading o Mattress - Order surface for patient o Turn and reposition every 2 hours Additional Orders / Instructions Wound #1 Right,Proximal Back o Increase protein intake. Wound #3 Right Elbow o Increase protein intake. Wound #4 Left Gluteus o Increase protein intake. Home Health Wound #1 Right,Proximal Back o D/C Home Health Services - [facility] request to [facility] Wound #3 Right Elbow o [facility] - [facility] request to [facility] Wound #4 Left Gluteus o D/C Home Health Services - Patient request to [facility] [name], [name] MALOOF ([id number]) Medications-please add to medication list. Wound #1 Right,Proximal Back o Other: - pain medication Wound #3 Right Elbow o Other: - pain medication Wound #4 Left Gluteus o Other: - pain medication Electronic Signature(s) Signed: 08/14/2016 4:47:51 PM By: Baltazar Najjar MD Signed: 08/15/2016 8:26:41 AM By: Elliot Gurney, BSN, RN, CWS, Kim RN, BSN Entered By: Elliot Gurney, BSN, RN, CWS, Kim on 08/14/2016 15:20:20 [name], [name] ([id number]) -------------------------------------------------------------------------------- Problem List Details Patient Name: OLUWATOBILOBA, MARTIN C. Date of Service: 08/14/2016 3:00 PM Medical Record Number: [id number] Patient Account Number: [id number] Date of Birth/Sex: 03-19-1950 (66 y.o. Male) Treating RN: Huel Coventry Primary Care Provider: Elizabeth Sauer Other Clinician: Referring Provider: Elizabeth Sauer Treating Provider/Extender: Maxwell Caul Weeks in Treatment: 29 Active Problems ICD-10 Encounter Code Description Active Date Diagnosis 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, stage 3 01/24/2016 Yes S14.103S Unspecified injury at C3 level of cervical spinal cord, 01/24/2016 Yes sequela S51.011A Laceration without foreign body of right elbow, initial  07/03/2016 Yes encounter L89.322 Pressure ulcer of left buttock, stage 2 08/14/2016 Yes Inactive Problems Resolved Problems ICD-10 Code Description Active Date Resolved Date L89.622 Pressure ulcer of left heel, stage 2 06/26/2016 06/26/2016 Electronic Signature(s) Signed: 08/14/2016 4:47:51 PM By: Baltazar Najjar MD Entered By: Baltazar Najjar on 08/14/2016 15:26:44 [name], [name] ([id number]) -------------------------------------------------------------------------------- Progress Note Details Patient Name: [name] Bras C. Date of Service: 08/14/2016 3:00 PM Medical Record Number: [id number] Patient Account Number: [id number] Date of Birth/Sex: 1950/08/22 (66 y.o. 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 He has lower extremity sensation and can move his legs but has no/limited control over his arms. His wife accompanies him today and states that in the late spring or early summer of 2017 the patient became very depressed. He refused the refused to mobilize and he developed several pressure ulcers on his back. Most of these have healed however they have a recalcitrant area over the right scapula. They've been using Santyl on this for at least the last month. In terms of depression the patient is doing better now on an antidepressant. He saw his primary physician on 01/12/16 at which time there was apparently green drainage coming out of this area [Dr. Deana Jones]. Dr. Yetta Barre works  in the [facility]. He has completed this Septra. Otherwise looking [facility] notes that he has a history of  seizures. He also had a stroke in 2008 he follows with neurology. He also has type 2 diabetes on Glucophage, hyperlipidemia and gastroesophageal reflux. He takes Plavix for stroke prevention and Keppra for seizure prophylaxis. 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 02/08/16; this is a patient with a pressure ulcer over the right scapula. His wife has been doing the dressing with Santyl and border foam change every second day. When he arrived here 2 weeks ago we did a fairly extensive mechanical debridement. We are asked medical modalities to go out to the home and see what they might be eligible for in terms of pressure-relief surfaces [level 2] but the wife states that they have not heard from them. 03/20/15; this is a patient we haven't seen in 5-6 weeks. This was largely due to transportation issues. They've been using Santyl and border foam changing every second day for a pressure injury over the right scapula. The patient has a C3-C4 spinal injury. We had also asked for a review by the people who supplied DME to look at his wheelchair cushion, mattress etc. I don't know that this ever happened. In the meantime the wound has done remarkably well current measurements 1.8 x 2 x 0.1 04/03/16; the patient's dimensions have gone up to 3 cm in diameter quite a deterioration from last time. He is also complaining of pain in this area which is new. 04/10/16; 3 x 1.5 x 0.1. No difference from last week. Culture I did last week was negative he has completed antibiotics. 04/24/16 2.4 x 2 x 0.1; wound generally looks smaller. Middle area that I had to debrided last time looks healthier. His son is fashioned a large piece of foam cut out where the patient's wound with hit the back of his  wheelchair 05/01/16; wound is a same size however the surface of this looks better. The middle innkeeper area required [name], [name]. ([id number]) a repeat debridement 05/15/16; wound is down and dimensions granulation still looks healthy. The medial aspect of this wound is now the deeper Divot. We'll see how this responds to further healing. We're using Hydrofera Blue 06/05/16; Wound 1.7x1.1x0.1 still using hyudrofera blue 06/26/16; the patient arrives back in clinic after a three-week hiatus. He was hospitalized from 06/09/16 through 06/10/16. He was found to be confused. CT scan of the head showed nothing really acute. His sodium was low at 131. He was rehydrated. He was felt to have a UTI and given antibiotics and antibiotics at discharge although his final culture result only showed multiple organisms. His wife says today that at the time of the hospitalization they discovered a large intact blister over the large aspect of his left calcaneus. This is recently ruptured. The wound on his right scapula is somewhat larger. More his wife is concerned about his current status. She states that he is still confused sleeps for long periods. He is not having fever chills cough or diarrhea [1 loose bowel movement per day]. He continues to have a suprapubic catheter. She notes that he is not eating and drinking well. Patient states he just does not want to eat. He does not feel nauseated or vomit. 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 Admission sodium was 131 at discharge 139 BUN 22 and 1.22 at admission, 17 and 1 at discharge. 07/03/16; patient's mental status is  back to normal. He has a new wound on the right elbow caused by traumatizing his elbow against the wall apparently at the dermatologist office last week. We continue with the original wound on the right scapula and the wound from 2 weeks ago on his left heel.  We have been using silver alginate to the area on the heel. 08/14/16; patient has not been here in almost 6 weeks. When he was here last time he had the pressure ulcer on the right scapula, atraumatic wound on the right lateral elbow and an area on his left heel. His wife states that at one point all of these were healed and she didn't really feel he needed to come back here. Since then the patient has developed a reopening of the area on the right scapula, a stage II wound on the left buttock and a reopening of the area on the right lateral elbow. As usual his wife as a litany of complaints against home health, she is dismissing or is going to dismiss well care. She tells us she has a long list of supplies at home already for some reason they were not felt to be eligible for a group 2 or 3 surface although they have a [name] bed at home but no offloading surface Objective Constitutional Patient is hypertensive.. Pulse regular and within target range for patient.Marland Kitchen. Respirations regular, non-labored and within target range.. Temperature is normal and within the target range for the patient.Marland Kitchen. appears in no distress. Vitals Time Taken: 2:46 PM, Height: 70 in, Weight: 187 lbs, BMI: 26.8, Temperature: 97.7 F, Pulse: 80 bpm, Respiratory Rate: 16 breaths/min, Blood Pressure: 87/50 mmHg. General Notes: Wound exam; pressure area over the right scapula is superficial but has a nonviable Slezak, Damin C. ([id number]) surface. This required debridement with a #3 curet removing necrotic material The area over the left heel she would not let us see but says this is healed A new wound over the right elbow from last time is superficial and appears healthy Superficial stage II over the left buttock Integumentary (Hair, Skin) Wound #1 status is Open. Original cause of wound was Pressure Injury. The wound is located on the Right,Proximal Back. The wound measures 2.1cm length x 1.4cm width x 0.2cm depth;  2.309cm^2 area and 0.462cm^3 volume. There is Fat Layer (Subcutaneous Tissue) Exposed exposed. There is a medium amount of serous drainage noted. The wound margin is flat and intact. There is large (67-100%) red, pink granulation within the wound bed. There is no necrotic tissue within the wound bed. The periwound skin appearance exhibited: Excoriation, Erythema. The periwound skin appearance did not exhibit: Callus, Crepitus, Induration, Rash, Scarring, Dry/Scaly, Maceration, Atrophie Blanche, Cyanosis, Ecchymosis, Hemosiderin Staining, Mottled, Pallor, Rubor. The surrounding wound skin color is noted with erythema which is circumferential. Periwound temperature was noted as No Abnormality. The periwound has tenderness on palpation. Wound #2 status is Healed - Epithelialized. Original cause of wound was Gradually Appeared. The wound is located on the Left Calcaneus. The wound measures 0cm length x 0cm width x 0cm depth; 0cm^2 area and 0cm^3 volume. Wound #3 status is Open. Original cause of wound was Trauma. The wound is located on the Right Elbow. The wound measures 1.2cm length x 1cm width x 0.1cm depth; 0.942cm^2 area and 0.094cm^3 volume. Wound #4 status is Open. Original cause of wound was Pressure Injury. The wound is located on the Left Gluteus. The wound measures 0.5cm length x 0.4cm width x 0.1cm depth; 0.157cm^2 area and 0.016cm^3  volume. There is Fat Layer (Subcutaneous Tissue) Exposed exposed. There is no tunneling or undermining noted. There is a large amount of serous drainage noted. The wound margin is indistinct and nonvisible. There is medium (34-66%) pink granulation within the wound bed. There is a small (1-33%) amount of necrotic tissue within the wound bed including Adherent Slough. The periwound skin appearance exhibited: Induration, Rubor. Periwound temperature was noted as No Abnormality. The periwound has tenderness on palpation. Assessment Active  Problems ICD-10 L89.893 - Pressure ulcer of other site, stage 3 S14.103S - Unspecified injury at C3 level of cervical spinal cord, sequela S51.011A - Laceration without foreign body of right elbow, initial encounter L89.322 - Pressure ulcer of left buttock, stage 2 [name], [name] ([id number]) Procedures Wound #1 Pre-procedure diagnosis of Wound #1 is a Pressure Ulcer located on the Right,Proximal Back . There was a Skin/Subcutaneous Tissue Debridement ([id number]) debridement with total area of 2.94 sq cm performed by Maxwell Caul, MD. with the following instrument(s): Curette to remove Viable and Non-Viable tissue/material including Fibrin/Slough and Subcutaneous after achieving pain control using Other (lidocaine 4%). A time out was conducted at 15:10, prior to the start of the procedure. A Minimum amount of bleeding was controlled with Pressure. The procedure was tolerated well with a pain level of 0 throughout and a pain level of 0 following the procedure. Post Debridement Measurements: 2.1cm length x 1.4cm width x 0.1cm depth; 0.231cm^3 volume. Post debridement Stage noted as Category/Stage II. Character of Wound/Ulcer Post Debridement requires further debridement. Post procedure Diagnosis Wound #1: Same as Pre-Procedure Plan Wound Cleansing: Wound #1 Right,Proximal Back: Clean wound with Normal Saline. Wound #3 Right Elbow: Clean wound with Normal Saline. Wound #4 Left Gluteus: Clean wound with Normal Saline. Anesthetic: Wound #1 Right,Proximal Back: Topical Lidocaine 4% cream applied to wound bed prior to debridement Wound #3 Right Elbow: Topical Lidocaine 4% cream applied to wound bed prior to debridement Wound #4 Left Gluteus: Topical Lidocaine 4% cream applied to wound bed prior to debridement Primary Wound Dressing: Wound #1 Right,Proximal Back: Hydrafera Blue Wound #3 Right Elbow: Hydrafera Blue Wound #4 Left Gluteus: Hydrafera Blue Secondary  Dressing: Wound #1 Right,Proximal Back: Boardered Foam Dressing Wound #3 Right Elbow: Boardered Foam Dressing Wound #4 Left Gluteus: Boardered Foam Dressing [name], [name] Kitchen ([id number]) Dressing Change Frequency: Wound #1 Right,Proximal Back: Change dressing every other day. Wound #3 Right Elbow: Change dressing every other day. Wound #4 Left Gluteus: Change dressing every other day. Follow-up Appointments: Wound #1 Right,Proximal Back: Return Appointment in 2 weeks. Wound #3 Right Elbow: Return Appointment in 2 weeks. Wound #4 Left Gluteus: Return Appointment in 2 weeks. Off-Loading: Mattress - Order surface for patient Turn and reposition every 2 hours Additional Orders / Instructions: Wound #1 Right,Proximal Back: Increase protein intake. Wound #3 Right Elbow: Increase protein intake. Wound #4 Left Gluteus: Increase protein intake. Home Health: Wound #1 Right,Proximal Back: D/C Home Health Services - [facility] request to [facility] Wound #3 Right Elbow: D/[facility] - [facility] request [facility] Wound #4 Left Gluteus: D/[facility] - [facility] request to [facility] Medications-please add to medication list.: Wound #1 Right,Proximal Back: Other: - pain medication Wound #3 Right Elbow: Other: - pain medication Wound #4 Left Gluteus: Other: - pain medication #1 I specifically wanted high drift fair at to the scapular area which seemed to help it last time and an interest of simplicity we will go ahead and order this to  other wounds as well #2 I think the patient should be a candidate for a pressure relief surface given his quadriparetic state and recurrent wounds Champoux, Jiyan C. ([id number]) #3 none of the 3 wounds appeared to be infected. Only the area on the scapula required debridement. Electronic Signature(s) Signed: 08/14/2016 3:47:16 PM By: Elliot Gurney, BSN, RN, CWS, Kim RN, BSN Signed: 08/14/2016 4:47:51 PM By: Baltazar Najjar  MD Entered By: Elliot Gurney, BSN, RN, CWS, Kim on 08/14/2016 15:47:16 Pasko, [name] ([id number]) -------------------------------------------------------------------------------- SuperBill Details Patient Name: AYSON, CHERUBINI. Date of Service: 08/14/2016 Medical Record Number: [id number] Patient Account Number: [id number] Date of Birth/Sex: 1951-02-12 (66 y.o. Male) Treating RN: Huel Coventry Primary Care Provider: Elizabeth Sauer Other Clinician: Referring Provider: Elizabeth Sauer Treating Provider/Extender: Maxwell Caul Weeks in Treatment: 29 Diagnosis Coding ICD-10 Codes Code Description [id number] Pressure ulcer of other site, stage 3 S14.103S Unspecified injury at C3 level of cervical spinal cord, sequela S51.011A Laceration without foreign body of right elbow, initial encounter L89.322 Pressure ulcer of left buttock, stage 2 Facility Procedures CPT4 Code Description: [id number] 11042 - DEB SUBQ TISSUE 20 SQ CM/< ICD-10 Description Diagnosis 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, stage 3 S51.011A Laceration without foreign body of right elbow, ini Modifier: tial encounter Quantity: 1 Physician Procedures CPT4 Code Description: [id number] 11042 - WC PHYS SUBQ TISS 20 SQ CM ICD-10 Description Diagnosis 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, stage 3 S51.011A Laceration without foreign body of right elbow, ini Modifier: tial encounter Quantity: 1 Electronic Signature(s) Signed: 08/14/2016 4:47:51 PM By: Baltazar Najjar MD Entered By: Baltazar Najjar on 08/14/2016 15:34:31

## 2016-08-16 NOTE — Progress Notes (Signed)
AVYAAN, SUMMER ([id number]) Visit Report for 08/14/2016 Arrival Information Details Patient Name: [name], [name]. Date of Service: 08/14/2016 3:00 PM Medical Record Number: [id number] Patient Account Number: [id number] Date of Birth/Sex: May 20, 1950 (66 y.o. Male) Treating RN: Cornell Barman Primary Care Shaune Malacara: Otilio Miu Other Clinician: Referring Kaylin Marcon: Otilio Miu Treating Leveon Pelzer/Extender: Tito Dine in Treatment: 29 Visit Information History Since Last Visit Added or deleted any medications: No Patient Arrived: Wheel Chair Any new allergies or adverse reactions: No Arrival Time: 14:44 Had a fall or experienced change in No activities of daily living that may affect Accompanied By: wife risk of falls: Transfer Assistance: None Signs or symptoms of abuse/neglect since last No Patient Identification Verified: Yes visito Secondary Verification Process Yes Hospitalized since last visit: No Completed: Pain Present Now: No Patient Requires Transmission-Based No Precautions: Patient Has Alerts: Yes Electronic Signature(s) Signed: 08/15/2016 8:26:41 AM By: Gretta Cool, BSN, RN, CWS, Kim RN, BSN Entered By: Gretta Cool, BSN, RN, CWS, Kim on 08/14/2016 14:46:26 Salomone, [name] ([id number]) -------------------------------------------------------------------------------- Encounter Discharge Information Details Patient Name: [name], [name] Date of Service: 08/14/2016 3:00 PM Medical Record Number: [id number] Patient Account Number: [id number] Date of Birth/Sex: 11-01-1950 (66 y.o. Male) Treating RN: Cornell Barman Primary Care Miyanna Wiersma: Otilio Miu Other Clinician: Referring Shaydon Lease: Otilio Miu Treating Camber Ninh/Extender: Tito Dine in Treatment: 50 Encounter Discharge Information Items Discharge Pain Level: 0 Discharge Condition: Stable Ambulatory Status: Wheelchair Discharge Destination: Home Transportation: Private Auto wife and Accompanied  By: grandson Schedule Follow-up Appointment: Yes Medication Reconciliation completed Yes and provided to Patient/Care Joscelynn Brutus: Patient Clinical Summary of Care: Declined Electronic Signature(s) Signed: 08/14/2016 3:36:15 PM By: Gretta Cool, BSN, RN, CWS, Kim RN, BSN Previous Signature: 08/14/2016 3:24:24 PM Version By: Ruthine Dose Entered By: Gretta Cool BSN, RN, CWS, Kim on 08/14/2016 15:36:15 [name], [name] ([id number]) -------------------------------------------------------------------------------- Lower Extremity Assessment Details Patient Name: [name], [name] Date of Service: 08/14/2016 3:00 PM Medical Record Number: [id number] Patient Account Number: [id number] Date of Birth/Sex: 27-Dec-1950 (66 y.o. Male) Treating RN: Cornell Barman Primary Care Dalvin Clipper: Otilio Miu Other Clinician: Referring Gid Schoffstall: Otilio Miu Treating Perlie Stene/Extender: Tito Dine in Treatment: 29 Electronic Signature(s) Signed: 08/15/2016 8:26:41 AM By: Gretta Cool, BSN, RN, CWS, Kim RN, BSN Entered By: Gretta Cool, BSN, RN, CWS, Kim on 08/14/2016 14:54:27 [name], [name] -------------------------------------------------------------------------------- Multi Wound Chart Details Patient Name: [name], [name] C. Date of Service: 08/14/2016 3:00 PM Medical Record Number: [id number] Patient Account Number: [id number] Date of Birth/Sex: [birth year] (66 y.o. 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, Other the Lower Extremity Date Acquired: 11/24/2015 06/12/2016 06/28/2016 Weeks of Treatment: '29 7 6 '$ Wound Status: Open Healed - Epithelialized Open Measurements L x W x D 2.1x1.4x0.1 0x0x0 1.2x1x0.1 (cm) Area (cm) : 2.309 0 0.942 Volume (cm) : 0.231 0 0.094 % Reduction in Area: 71.30% N/A 68.30% % Reduction in Volume: 71.30% N/A 68.40% Classification: Category/Stage II Grade 1 Partial Thickness Debridement: Debridement (14970- N/A N/A 11047) Pre-procedure 15:10 N/A N/A Verification/Time Out Taken: Pain Control: Other N/A N/A Tissue Debrided: Fibrin/Slough, N/A N/A Subcutaneous Level: Skin/Subcutaneous N/A N/A Tissue [name], [name] ([id number]) Debridement Area (  sq 2.94 N/A N/A cm): Instrument: Curette N/A N/A Bleeding: Minimum N/A N/A Hemostasis Achieved: Pressure N/A N/A Procedural Pain: 0 N/A N/A Post Procedural Pain: 0 N/A N/A Debridement Treatment Procedure was tolerated N/A N/A Response: well Post Debridement 2.1x1.4x0.1 N/A N/A Measurements L x W x D (cm) Post Debridement 0.231 N/A N/A Volume: (cm) Post Debridement Category/Stage II N/A N/A Stage: Periwound Skin Texture: No Abnormalities Noted No Abnormalities Noted No Abnormalities Noted Periwound Skin No Abnormalities Noted No Abnormalities Noted No Abnormalities Noted Moisture: Periwound Skin Color: No Abnormalities Noted No Abnormalities Noted No Abnormalities Noted Tenderness on No No No Palpation: Procedures Performed: Debridement N/A N/A Wound Number: 4 N/A N/A Photos: N/A N/A Wound Location: Left Gluteus N/A N/A Wounding Event: Pressure Injury N/A N/A Primary Etiology: Pressure Ulcer N/A N/A Date Acquired: 07/09/2016 N/A N/A Weeks of Treatment: 0 N/A N/A Wound Status: Open N/A N/A Measurements L x W x D 0.5x0.4x0.1 N/A N/A (cm) Area (cm) : 0.157 N/A N/A Volume (cm) : 0.016 N/A N/A % Reduction in Area: N/A N/A N/A % Reduction in Volume: N/A N/A  N/A Classification: N/A N/A N/A Debridement: N/A N/A N/A Pain Control: N/A N/A N/A Tissue Debrided: N/A N/A N/A Level: N/A N/A N/A Debridement Area (sq N/A N/A N/A cm): Monday, AADITYA LETIZIA ([id number]) Instrument: N/A N/A N/A Bleeding: N/A N/A N/A Hemostasis Achieved: N/A N/A N/A Procedural Pain: N/A N/A N/A Post Procedural Pain: N/A N/A N/A Debridement Treatment N/A N/A N/A Response: Post Debridement N/A N/A N/A Measurements L x W x D (cm) Post Debridement N/A N/A N/A Volume: (cm) Post Debridement N/A N/A N/A Stage: Periwound Skin Texture: No Abnormalities Noted N/A N/A Periwound Skin No Abnormalities Noted N/A N/A Moisture: Periwound Skin Color: No Abnormalities Noted N/A N/A Tenderness on No N/A N/A Palpation: Procedures Performed: N/A N/A N/A Treatment Notes Electronic Signature(s) Signed: 08/14/2016 4:47:51 PM By: Linton Ham MD Entered By: Linton Ham on 08/14/2016 15:27:00 Borton, [name] ([id number]) -------------------------------------------------------------------------------- Multi-Disciplinary Care Plan Details Patient Name: [name] Coast C. Date of Service: 08/14/2016 3:00 PM Medical Record Number: [id number] Patient Account Number: [id number] Date of Birth/Sex: [birth year] (66 y.o. Male) Treating RN: Cornell Barman Primary Care Russie Gulledge: Otilio Miu Other Clinician: Referring Noble Cicalese: Otilio Miu Treating Alcides Nutting/Extender: Ricard Dillon Weeks in Treatment: 48 Active Inactive ` Nutrition Nursing Diagnoses: Imbalanced nutrition Impaired glucose control: actual or potential Goals: Patient/caregiver agrees to and verbalizes understanding of need to use nutritional supplements and/or vitamins as prescribed Date Initiated: 01/24/2016 Target Resolution Date: 03/27/2016 Goal Status: Active Patient/caregiver will maintain therapeutic glucose control Date Initiated: 01/24/2016 Target Resolution Date: 03/27/2016 Goal Status:  Active Interventions: Assess patient nutrition upon admission and as needed per policy Provide education on elevated blood sugars and impact on wound healing Notes: ` Orientation to the Wound Care Program Nursing Diagnoses: Knowledge deficit related to the wound healing center program Goals: Patient/caregiver will verbalize understanding of the [place] Date Initiated: 01/24/2016 Target Resolution Date: 03/27/2016 Goal Status: Active Interventions: Provide education on orientation to the wound center Notes: AMAREE, LEEPER ([id number]) ` Pain, Acute or Chronic Nursing Diagnoses: Pain, acute or chronic: actual or potential Potential alteration in comfort, pain Goals: Patient will verbalize adequate pain control and receive pain control interventions during procedures as needed Date Initiated: 01/24/2016 Target Resolution Date: 03/27/2016 Goal Status: Active Patient/caregiver will verbalize adequate pain control between visits Date Initiated: 01/24/2016 Target Resolution Date: 03/27/2016 Goal Status: Active Patient/caregiver will verbalize comfort level met Date Initiated: 01/24/2016 Target Resolution Date:  03/27/2016 Goal Status: Active Interventions: Assess comfort goal upon admission Complete pain assessment as per visit requirements Notes: ` Pressure Nursing Diagnoses: Knowledge deficit related to management of pressures ulcers Potential for impaired tissue integrity related to pressure, friction, moisture, and shear Goals: Patient will remain free from development of additional pressure ulcers Date Initiated: 01/24/2016 Target Resolution Date: 03/27/2016 Goal Status: Active Interventions: Assess: immobility, friction, shearing, incontinence upon admission and as needed Assess offloading mechanisms upon admission and as needed Assess potential for pressure ulcer upon admission and as needed Provide education on pressure  ulcers Notes: TIARA, BARTOLI ([id number]) Wound/Skin Impairment Nursing Diagnoses: Impaired tissue integrity Goals: Ulcer/skin breakdown will have a volume reduction of 30% by week 4 Date Initiated: 01/24/2016 Target Resolution Date: 03/27/2016 Goal Status: Active Ulcer/skin breakdown will have a volume reduction of 50% by week 8 Date Initiated: 01/24/2016 Target Resolution Date: 03/27/2016 Goal Status: Active Ulcer/skin breakdown will have a volume reduction of 80% by week 12 Date Initiated: 01/24/2016 Target Resolution Date: 03/27/2016 Goal Status: Active Interventions: Assess patient/caregiver ability to perform ulcer/skin care regimen upon admission and as needed Assess ulceration(s) every visit Notes: Electronic Signature(s) Signed: 08/15/2016 8:26:41 AM By: Gretta Cool, BSN, RN, CWS, Kim RN, BSN Entered By: Gretta Cool, BSN, RN, CWS, Kim on 08/14/2016 14:55:38 [name], [name] ([id number]) -------------------------------------------------------------------------------- Pain Assessment Details Patient Name: [name] Coast C. Date of Service: 08/14/2016 3:00 PM Medical Record Number: [id number] Patient Account Number: [id number] Date of Birth/Sex: [birth year] (66 y.o. Male) Treating RN: Cornell Barman Primary Care Suyash Amory: Otilio Miu Other Clinician: Referring Auden Tatar: Otilio Miu Treating Dim Meisinger/Extender: Ricard Dillon Weeks in Treatment: 29 Active Problems Location of Pain Severity and Description of Pain Patient Has Paino No Site Locations With Dressing Change: No Pain Management and Medication Current Pain Management: Electronic Signature(s) Signed: 08/15/2016 8:26:41 AM By: Gretta Cool, BSN, RN, CWS, Kim RN, BSN Entered By: Gretta Cool, BSN, RN, CWS, Kim on 08/14/2016 14:46:36 Sewell, [name] ([id number]) -------------------------------------------------------------------------------- Patient/Caregiver Education Details Patient Name: [name]. Date of Service:  08/14/2016 3:00 PM Medical Record Number: [id number] Patient Account Number: [id number] Date of Birth/Gender: Jul 08, 1950 (66 y.o. Male) Treating RN: Cornell Barman Primary Care Physician: Otilio Miu Other Clinician: Referring Physician: Otilio Miu Treating Physician/Extender: Tito Dine in Treatment: 48 Education Assessment Education Provided To: Patient and Caregiver Education Topics Provided Pressure: Handouts: Pressure Ulcers: Care and Offloading Methods: Demonstration Responses: State content correctly Wound/Skin Impairment: Handouts: Caring for Your Ulcer, Other: wound care as prescribed Methods: Demonstration, Explain/Verbal Responses: State content correctly Electronic Signature(s) Signed: 08/15/2016 8:26:41 AM By: Gretta Cool, BSN, RN, CWS, Kim RN, BSN Entered By: Gretta Cool, BSN, RN, CWS, Kim on 08/14/2016 15:36:55 [name], [name] ([id number]) -------------------------------------------------------------------------------- Wound Assessment Details Patient Name: [name], [name] Date of Service: 08/14/2016 3:00 PM Medical Record Number: [id number] Patient Account Number: [id number] Date of Birth/Sex: 21-May-1950 (66 y.o. Male) Treating RN: Cornell Barman Primary Care Brigett Estell: Otilio Miu Other Clinician: Referring Tashayla Therien: Otilio Miu Treating Bocephus Cali/Extender: Ricard Dillon Weeks in Treatment: 29 Wound Status Wound Number: 1 Primary Pressure Ulcer Etiology: Wound Location: Right Back - Proximal Wound Open Wounding Event: Pressure Injury Status: Date Acquired: 11/24/2015 Comorbid Coronary Artery Disease, Type II Weeks Of Treatment: 29 History: Diabetes, Quadriplegia, Seizure Clustered Wound: No Disorder Photos Wound Measurements Length: (cm) 2.1 Width: (cm) 1.4 Depth: (cm) 0.2 Area: (cm) 2.309 Volume: (cm) 0.462 % Reduction in Area: 71.3% % Reduction in Volume: 42.6% Epithelialization: Small (1-33%) Wound Description Classification:  Category/Stage II Foul Odor Afte Wound Margin: Flat  and Intact Slough/Fibrino Exudate Amount: Medium Exudate Type: Serous Exudate Color: amber r Cleansing: No No Wound Bed Granulation Amount: Large (67-100%) Exposed Structure Granulation Quality: Red, Pink Fascia Exposed: No Necrotic Amount: None Present (0%) Fat Layer (Subcutaneous Tissue) Exposed: Yes Tendon Exposed: No Muscle Exposed: No Joint Exposed: No Bone Exposed: No Segler, Shailen C. ([id number]) Periwound Skin Texture Texture Color No Abnormalities Noted: No No Abnormalities Noted: No Callus: No Atrophie Blanche: No Crepitus: No Cyanosis: No Excoriation: Yes Ecchymosis: No Induration: No Erythema: Yes Rash: No Erythema Location: Circumferential Scarring: No Hemosiderin Staining: No Mottled: No Moisture Pallor: No No Abnormalities Noted: No Rubor: No Dry / Scaly: No Maceration: No Temperature / Pain Temperature: No Abnormality Tenderness on Palpation: Yes Wound Preparation Ulcer Cleansing: Rinsed/Irrigated with Saline Topical Anesthetic Applied: Other: lidocaine 4%, Treatment Notes Wound #1 (Right, Proximal Back) 1. Cleansed with: Clean wound with Normal Saline 4. Dressing Applied: Hydrafera Blue 5. Secondary Dressing Applied Bordered Foam Dressing Dry Gauze Electronic Signature(s) Signed: 08/14/2016 3:46:55 PM By: Gretta Cool, BSN, RN, CWS, Kim RN, BSN Entered By: Gretta Cool, BSN, RN, CWS, Kim on 08/14/2016 15:46:55 [name], [name] ([id number]) -------------------------------------------------------------------------------- Wound Assessment Details Patient Name: [name], [name] Date of Service: 08/14/2016 3:00 PM Medical Record Number: [id number] Patient Account Number: [id number] Date of Birth/Sex: 07/31/1950 (66 y.o. Male) Treating RN: Cornell Barman Primary Care Mannie Wineland: Otilio Miu Other Clinician: Referring Minah Axelrod: Otilio Miu Treating Talan Gildner/Extender: Ricard Dillon Weeks in Treatment:  29 Wound Status Wound Number: 2 Primary Diabetic Wound/Ulcer of the Lower Etiology: Extremity Wound Location: Left Calcaneus Wound Status: Healed - Epithelialized Wounding Event: Gradually Appeared Date Acquired: 06/12/2016 Weeks Of Treatment: 7 Clustered Wound: No Photos Photo Uploaded By: Gretta Cool, BSN, RN, CWS, Kim on 08/14/2016 16:57:24 Wound Measurements Length: (cm) Width: (cm) Depth: (cm) Area: (cm) Volume: (cm) 0 % Reduction in Area: 0 % Reduction in Volume: 0 0 0 Wound Description Classification: Grade 1 Periwound Skin Texture Texture Color No Abnormalities Noted: No No Abnormalities Noted: No Moisture No Abnormalities Noted: No DAETON, KLUTH ([id number]) Electronic Signature(s) Signed: 08/15/2016 8:26:41 AM By: Gretta Cool, BSN, RN, CWS, Kim RN, BSN Entered By: Gretta Cool, BSN, RN, CWS, Kim on 08/14/2016 14:52:38 [name], [name] ([id number]) -------------------------------------------------------------------------------- Wound Assessment Details Patient Name: Risby, Pearse C. Date of Service: 08/14/2016 3:00 PM Medical Record Number: [id number] Patient Account Number: [id number] Date of Birth/Sex: 1950-10-29 (66 y.o. Male) Treating RN: Cornell Barman Primary Care Yuvia Plant: Otilio Miu Other Clinician: Referring Jereme Loren: Otilio Miu Treating Marlinda Miranda/Extender: Ricard Dillon Weeks in Treatment: 29 Wound Status Wound Number: 3 Primary Etiology: Trauma, Other Wound Location: Right Elbow Wound Status: Open Wounding Event: Trauma Date Acquired: 06/28/2016 Weeks Of Treatment: 6 Clustered Wound: No Photos Photo Uploaded By: Gretta Cool, BSN, RN, CWS, Kim on 08/14/2016 14:57:18 Wound Measurements Length: (cm) 1.2 Width: (cm) 1 Depth: (cm) 0.1 Area: (cm) 0.942 Volume: (cm) 0.094 % Reduction in Area: 68.3% % Reduction in Volume: 68.4% Wound Description Classification: Partial Thickness Periwound Skin Texture Texture Color No Abnormalities Noted: No No  Abnormalities Noted: No Moisture No Abnormalities Noted: No Treatment Notes Wound #3 (Right Elbow) 1. Cleansed with: Clean wound with Normal Saline 4. Dressing Applied: BOSTYN, BOGIE ([id number]) Hydrafera Blue 5. Secondary Dressing Applied Bordered Foam Dressing Dry Gauze Electronic Signature(s) Signed: 08/15/2016 8:26:41 AM By: Gretta Cool, BSN, RN, CWS, Kim RN, BSN Entered By: Gretta Cool, BSN, RN, CWS, Kim on 08/14/2016 14:52:38 Sabin, [name] ([id number]) -------------------------------------------------------------------------------- Wound Assessment Details Patient Name: Hagarty, Brenyn C. Date of Service: 08/14/2016 3:00 PM Medical  Record Number: [id number] Patient Account Number: [id number] Date of Birth/Sex: 1950/10/07 (66 y.o. Male) Treating RN: Cornell Barman Primary Care Lerin Jech: Otilio Miu Other Clinician: Referring Abiel Antrim: Otilio Miu Treating Karlis Cregg/Extender: Ricard Dillon Weeks in Treatment: 29 Wound Status Wound Number: 4 Primary Pressure Ulcer Etiology: Wound Location: Left Gluteus Wound Open Wounding Event: Pressure Injury Status: Date Acquired: 07/09/2016 Comorbid Coronary Artery Disease, Type II Weeks Of Treatment: 0 History: Diabetes, Quadriplegia, Seizure Clustered Wound: No Disorder Photos Wound Measurements Length: (cm) 0.5 Width: (cm) 0.4 Depth: (cm) 0.1 Area: (cm) 0.157 Volume: (cm) 0.016 % Reduction in Area: 0% % Reduction in Volume: 0% Epithelialization: Small (1-33%) Tunneling: No Undermining: No Wound Description Classification: Category/Stage II Wound Margin: Indistinct, nonvisible Exudate Amount: Large Exudate Type: Serous Exudate Color: amber Foul Odor After Cleansing: No Slough/Fibrino Yes Wound Bed Granulation Amount: Medium (34-66%) Exposed Structure Granulation Quality: Pink Fascia Exposed: No Necrotic Amount: Small (1-33%) Fat Layer (Subcutaneous Tissue) Exposed: Yes Necrotic Quality: Adherent Slough Tendon  Exposed: No Muscle Exposed: No Joint Exposed: No Bone Exposed: No Flakes, Kaiyden C. ([id number]) Periwound Skin Texture Texture Color No Abnormalities Noted: No No Abnormalities Noted: No Induration: Yes Rubor: Yes Moisture Temperature / Pain No Abnormalities Noted: No Temperature: No Abnormality Tenderness on Palpation: Yes Wound Preparation Ulcer Cleansing: Rinsed/Irrigated with Saline Topical Anesthetic Applied: None Treatment Notes Wound #4 (Left Gluteus) 1. Cleansed with: Clean wound with Normal Saline 4. Dressing Applied: Hydrafera Blue 5. Secondary Dressing Applied Bordered Foam Dressing Dry Gauze Electronic Signature(s) Signed: 08/14/2016 3:39:53 PM By: Gretta Cool, BSN, RN, CWS, Kim RN, BSN Entered By: Gretta Cool, BSN, RN, CWS, Kim on 08/14/2016 15:39:52 Dake, [name] ([id number]) -------------------------------------------------------------------------------- Vitals Details Patient Name: [name] Coast C. Date of Service: 08/14/2016 3:00 PM Medical Record Number: [id number] Patient Account Number: [id number] Date of Birth/Sex: 09-Nov-1950 (66 y.o. Male) Treating RN: Cornell Barman Primary Care Doll Frazee: Otilio Miu Other Clinician: Referring Murl Zogg: Otilio Miu Treating Goldia Ligman/Extender: Ricard Dillon Weeks in Treatment: 29 Vital Signs Time Taken: 14:46 Temperature (F): 97.7 Height (in): 70 Pulse (bpm): 80 Weight (lbs): 187 Respiratory Rate (breaths/min): 16 Body Mass Index (BMI): 26.8 Blood Pressure (mmHg): 87/50 Reference Range: 80 - 120 mg / dl Electronic Signature(s) Signed: 08/15/2016 8:26:41 AM By: Gretta Cool, BSN, RN, CWS, Kim RN, BSN Entered By: Gretta Cool, BSN, RN, CWS, Kim on 08/14/2016 14:46:58

## 2016-08-18 DIAGNOSIS — L89323 Pressure ulcer of left buttock, stage 3: Secondary | ICD-10-CM | POA: Diagnosis not present

## 2016-08-18 DIAGNOSIS — G8250 Quadriplegia, unspecified: Secondary | ICD-10-CM | POA: Diagnosis not present

## 2016-08-28 ENCOUNTER — Encounter: Payer: Medicare Other | Attending: Internal Medicine | Admitting: Internal Medicine

## 2016-08-28 DIAGNOSIS — I959 Hypotension, unspecified: Secondary | ICD-10-CM | POA: Insufficient documentation

## 2016-08-28 DIAGNOSIS — K219 Gastro-esophageal reflux disease without esophagitis: Secondary | ICD-10-CM | POA: Insufficient documentation

## 2016-08-28 DIAGNOSIS — Z8673 Personal history of transient ischemic attack (TIA), and cerebral infarction without residual deficits: Secondary | ICD-10-CM | POA: Insufficient documentation

## 2016-08-28 DIAGNOSIS — E1165 Type 2 diabetes mellitus with hyperglycemia: Secondary | ICD-10-CM | POA: Insufficient documentation

## 2016-08-28 DIAGNOSIS — L89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, stage 3: Secondary | ICD-10-CM | POA: Insufficient documentation

## 2016-08-28 DIAGNOSIS — L89322 Pressure ulcer of left buttock, stage 2: Secondary | ICD-10-CM | POA: Diagnosis not present

## 2016-08-28 DIAGNOSIS — G8252 Quadriplegia, C1-C4 incomplete: Secondary | ICD-10-CM | POA: Diagnosis not present

## 2016-08-28 DIAGNOSIS — S14103S Unspecified injury at C3 level of cervical spinal cord, sequela: Secondary | ICD-10-CM | POA: Insufficient documentation

## 2016-08-28 DIAGNOSIS — R569 Unspecified convulsions: Secondary | ICD-10-CM | POA: Insufficient documentation

## 2016-08-28 DIAGNOSIS — F329 Major depressive disorder, single episode, unspecified: Secondary | ICD-10-CM | POA: Insufficient documentation

## 2016-08-28 DIAGNOSIS — Z7984 Long term (current) use of oral hypoglycemic drugs: Secondary | ICD-10-CM | POA: Diagnosis not present

## 2016-08-28 DIAGNOSIS — X58XXXS Exposure to other specified factors, sequela: Secondary | ICD-10-CM | POA: Diagnosis not present

## 2016-08-28 DIAGNOSIS — S51011A Laceration without foreign body of right elbow, initial encounter: Secondary | ICD-10-CM | POA: Diagnosis not present

## 2016-08-28 DIAGNOSIS — S31000A Unspecified open wound of lower back and pelvis without penetration into retroperitoneum, initial encounter: Secondary | ICD-10-CM | POA: Diagnosis not present

## 2016-08-28 DIAGNOSIS — E785 Hyperlipidemia, unspecified: Secondary | ICD-10-CM | POA: Insufficient documentation

## 2016-08-29 NOTE — Progress Notes (Signed)
CHASKE, PASKETT ([id number]) Visit Report for 08/28/2016 Chief Complaint Document Details Patient Name: KEY, CEN. Date of Service: 08/28/2016 11:15 AM Medical Record Number: [id number] Patient Account Number: [id number] Date of Birth/Sex: 01-31-1951 (66 y.o. Male) Treating RN: Clover Mealy, RN, BSN, [place] Sink Primary Care Provider: Elizabeth Sauer Other Clinician: Referring Provider: Elizabeth Sauer Treating Provider/Extender: Maxwell Caul Weeks in Treatment: 31 Information Obtained from: Patient Chief Complaint 01/24/16 patient arrives today for review of a pressure ulcer on his right scapula in the setting of incomplete C3-C4 quadriplegia Electronic Signature(s) Signed: 08/28/2016 5:35:48 PM By: Baltazar Najjar MD Entered By: Baltazar Najjar on 08/28/2016 16:40:10 Kluger, [name] ([id number]) -------------------------------------------------------------------------------- HPI Details Patient Name: [name] Date of Service: 08/28/2016 11:15 AM Medical Record Number: [id number] Patient Account Number: [id number] Date of Birth/Sex: 04-17-1950 (66 y.o. 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 He has lower extremity sensation and can move his legs but has no/limited control over his arms. His wife accompanies him today and states that in the late spring or early summer of 2017 the patient became very depressed. He refused the refused to mobilize and he developed several pressure ulcers on his back. Most of these have healed however they have a recalcitrant area over the right scapula. They've been using Santyl on this for at least the last month.  In terms of depression the patient is doing better now on an antidepressant. He saw his primary physician on 01/12/16 at which time there was apparently green drainage coming out of this area [Dr. Deana Jones]. Dr. Yetta Barre works in the [facility]. He has completed this Septra. Otherwise looking [facility] notes that he has a history of seizures. He also had a stroke in 2008 he follows with neurology. He also has type 2 diabetes on Glucophage, hyperlipidemia and gastroesophageal reflux. He takes Plavix for stroke prevention and Keppra for seizure prophylaxis. 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 02/08/16; this is a patient with a pressure ulcer over the right scapula. His wife has been doing the dressing with Santyl and border foam change every second day. When he arrived here 2 weeks ago we did a fairly extensive mechanical debridement. We are asked medical modalities to go out to the home and see what they might be eligible for in terms of pressure-relief surfaces [level 2] but the wife states that they have not heard from them. 03/20/15; this is a patient we haven't seen in 5-6 weeks. This was largely due to transportation issues. They've been using Santyl and border foam changing every second day for a pressure injury over the right scapula. The patient has a C3-C4 spinal injury. We had also asked for a review by the people who supplied DME to look at his wheelchair cushion, mattress etc. I don't know that this ever happened. In the meantime the wound has done remarkably well current measurements 1.8 x 2 x 0.1 04/03/16; the patient's dimensions have gone up to 3 cm in diameter quite a deterioration from last time. He is also complaining of pain in this area which is new. 04/10/16; 3  x 1.5 x 0.1. No difference from last week. Culture I did last week was negative he has  completed antibiotics. 04/24/16 2.4 x 2 x 0.1; wound generally looks smaller. Middle area that I had to debrided last time looks healthier. His son is fashioned a large piece of foam cut out where the patient's wound with hit the back of his wheelchair 05/01/16; wound is a same size however the surface of this looks better. The middle innkeeper area required a repeat debridement 05/15/16; wound is down and dimensions granulation still looks healthy. The medial aspect of this wound is now the deeper Divot. We'll see how this responds to further healing. We're using Hydrofera Blue 06/05/16; Wound 1.7x1.1x0.1 still using hyudrofera blue 06/26/16; the patient arrives back in clinic after a three-week hiatus. He was hospitalized from 06/09/16 through 06/10/16. He was found to be confused. CT scan of the head showed nothing really acute. His sodium was Deharo, Drayton C. ([id number]) low at 131. He was rehydrated. He was felt to have a UTI and given antibiotics and antibiotics at discharge although his final culture result only showed multiple organisms. His wife says today that at the time of the hospitalization they discovered a large intact blister over the large aspect of his left calcaneus. This is recently ruptured. The wound on his right scapula is somewhat larger. More his wife is concerned about his current status. She states that he is still confused sleeps for long periods. He is not having fever chills cough or diarrhea [1 loose bowel movement per day]. He continues to have a suprapubic catheter. She notes that he is not eating and drinking well. Patient states he just does not want to eat. He does not feel nauseated or vomit. 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 Admission sodium was 131 at discharge 139 BUN 22 and 1.22 at admission, 17 and 1 at discharge. 07/03/16; patient's mental status is back to normal.  He has a new wound on the right elbow caused by traumatizing his elbow against the wall apparently at the dermatologist office last week. We continue with the original wound on the right scapula and the wound from 2 weeks ago on his left heel. We have been using silver alginate to the area on the heel. 08/14/16; patient has not been here in almost 6 weeks. When he was here last time he had the pressure ulcer on the right scapula, atraumatic wound on the right lateral elbow and an area on his left heel. His wife states that at one point all of these were healed and she didn't really feel he needed to come back here. Since then the patient has developed a reopening of the area on the right scapula, a stage II wound on the left buttock and a reopening of the area on the right lateral elbow. As usual his wife as a litany of complaints against home health, she is dismissing or is going to dismiss well care. She tells [place] she has a long list of supplies at home already for some reason they were not felt to be eligible for a group 2 or 3 surface although they have a hospital bed at home but no offloading surface 08/28/16; the patient arrived today unfortunately incontinent of stool in spite of this the wounds all appear to be better including the right scapular area, his left buttock's left heel and the right lateral  elbow. Electronic Signature(s) Signed: 08/28/2016 5:35:48 PM By: Baltazar Najjar MD Entered By: Baltazar Najjar on 08/28/2016 16:42:01 Sharman, [name] ([id number]) -------------------------------------------------------------------------------- Physical Exam Details Patient Name: Stroot, Levie C. Date of Service: 08/28/2016 11:15 AM Medical Record Number: [id number] Patient Account Number: [id number] Date of Birth/Sex: 10-11-1950 (66 y.o. Male) Treating RN: Clover Mealy, RN, BSN, [place] Sink Primary Care Provider: Elizabeth Sauer Other Clinician: Referring Provider: Elizabeth Sauer Treating  Provider/Extender: Maxwell Caul Weeks in Treatment: 31 Constitutional Patient is hypotensive. Appears well. Pulse regular and within target range for patient.Marland Kitchen Respirations regular, non-labored and within target range.. Temperature is normal and within the target range for the patient.Marland Kitchen appears in no distress. Notes Wound exam; pressure area over the right scapula is very small and superficial no debridement is required on any wound area. oThe area over the left heel according to his wife is still healed. oRight elbow wound superficial and appears healthy oStage II over the left buttock I did not see today Electronic Signature(s) Signed: 08/28/2016 5:35:48 PM By: Baltazar Najjar MD Entered By: Baltazar Najjar on 08/28/2016 16:42:58 Cho, [name] ([id number]) -------------------------------------------------------------------------------- Physician Orders Details Patient Name: Sitzmann, Tyvon C. Date of Service: 08/28/2016 11:15 AM Medical Record Number: [id number] Patient Account Number: [id number] Date of Birth/Sex: 1950-10-22 (66 y.o. Male) Treating RN: Clover Mealy, RN, BSN, [place] Sink Primary Care Provider: Elizabeth Sauer Other Clinician: Referring Provider: Elizabeth Sauer Treating Provider/Extender: Altamese [place] in Treatment: 43 Verbal / Phone Orders: No Diagnosis Coding Wound Cleansing Wound #1 Right,Proximal Back o Clean wound with Normal Saline. Wound #3 Right Elbow o Clean wound with Normal Saline. Wound #4 Left Gluteus o Clean wound with Normal Saline. Anesthetic Wound #1 Right,Proximal Back o Topical Lidocaine 4% cream applied to wound bed prior to debridement Wound #3 Right Elbow o Topical Lidocaine 4% cream applied to wound bed prior to debridement Wound #4 Left Gluteus o Topical Lidocaine 4% cream applied to wound bed prior to debridement Primary Wound Dressing Wound #1 Right,Proximal Back o Hydrafera Blue Wound #3 Right Elbow o Hydrafera  Blue Wound #4 Left Gluteus o Hydrafera Blue Secondary Dressing Wound #1 Right,Proximal Back o Boardered Foam Dressing Wound #3 Right Elbow o Boardered Foam Dressing Propst, Asahel C. ([id number]) Wound #4 Left Gluteus o Boardered Foam Dressing Dressing Change Frequency Wound #1 Right,Proximal Back o Change dressing every other day. Wound #3 Right Elbow o Change dressing every other day. Wound #4 Left Gluteus o Change dressing every other day. Follow-up Appointments Wound #1 Right,Proximal Back o Return Appointment in 2 weeks. Wound #3 Right Elbow o Return Appointment in 2 weeks. Wound #4 Left Gluteus o Return Appointment in 2 weeks. Off-Loading o Mattress - Order surface for patient o Turn and reposition every 2 hours Additional Orders / Instructions Wound #1 Right,Proximal Back o Increase protein intake. Wound #3 Right Elbow o Increase protein intake. Wound #4 Left Gluteus o Increase protein intake. Medications-please add to medication list. Wound #1 Right,Proximal Back o Other: - pain medication Wound #3 Right Elbow o Other: - pain medication Wound #4 Left Gluteus o Other: - pain medication TYMEIR, WEATHINGTON ([id number]) Electronic Signature(s) Signed: 08/28/2016 4:25:10 PM By: Elpidio Eric BSN, RN Signed: 08/28/2016 5:35:48 PM By: Baltazar Najjar MD Entered By: Elpidio Eric on 08/28/2016 12:11:15 Fisk, [name] ([id number]) -------------------------------------------------------------------------------- Problem List Details Patient Name: Loyal, Shayon C. Date of Service: 08/28/2016 11:15 AM Medical Record Number: [id number] Patient Account Number: [id number] Date of Birth/Sex: 10-Jan-1951 (66 y.o. Male) Treating RN: Clover Mealy, RN, BSN, [facility]  Provider: Elizabeth SauerJones, Deanna Other Clinician: Referring Provider: Elizabeth SauerJones, Deanna Treating Provider/Extender: Altamese CarolinaOBSON, MICHAEL G Weeks in Treatment: 31 Active  Problems ICD-10 Encounter Code Description Active Date Diagnosis 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, stage 3 01/24/2016 Yes S14.103S Unspecified injury at C3 level of cervical spinal cord, 01/24/2016 Yes sequela S51.011A Laceration without foreign body of right elbow, initial 07/03/2016 Yes encounter L89.322 Pressure ulcer of left buttock, stage 2 08/14/2016 Yes Inactive Problems Resolved Problems ICD-10 Code Description Active Date Resolved Date L89.622 Pressure ulcer of left heel, stage 2 06/26/2016 06/26/2016 Electronic Signature(s) Signed: 08/28/2016 5:35:48 PM By: Baltazar Najjarobson, Michael MD Entered By: Baltazar Najjarobson, Michael on 08/28/2016 16:39:55 Ibbotson, [name] ([id number]) -------------------------------------------------------------------------------- Progress Note Details Patient Name: [name], Corbin C. Date of Service: 08/28/2016 11:15 AM Medical Record Number: [id number] Patient Account Number: [id number] Date of Birth/Sex: 1950/06/23 77(66 y.o. 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 He has lower extremity sensation and can move his legs but has no/limited control over his arms. His wife accompanies him today and states that in the late spring or early summer of 2017 the patient became very depressed. He refused the refused to mobilize and he developed several pressure ulcers on his back. Most of these have healed however they have a recalcitrant area over the right  scapula. They've been using Santyl on this for at least the last month. In terms of depression the patient is doing better now on an antidepressant. He saw his primary physician on 01/12/16 at which time there was apparently green drainage coming out of this area [Dr. Deana Jones]. Dr. Yetta [facility]reJones [facility]. He has completed this Septra. Otherwise looking [facility] notes that he has a history of seizures. He also had a stroke in 2008 he follows with neurology. He also has type 2 diabetes on Glucophage, hyperlipidemia and gastroesophageal reflux. He takes Plavix for stroke prevention and Keppra for seizure prophylaxis. 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 02/08/16; this is a patient with a pressure ulcer over the right scapula. His wife has been doing the dressing with Santyl and border foam change every second day. When he arrived here 2 weeks ago we did a fairly extensive mechanical debridement. We are asked medical modalities to go out to the home and see what they might be eligible for in terms of pressure-relief surfaces [level 2] but the wife states that they have not heard from them. 03/20/15; this is a patient we haven't seen in 5-6 weeks. This was largely due to transportation issues. They've been using Santyl and border foam changing every second day for a pressure injury over the right scapula. The patient has a C3-C4 spinal injury. We had also asked for a review by the people who supplied DME to look at his wheelchair cushion, mattress etc. I don't know that this ever happened. In the meantime the wound has done remarkably well current measurements 1.8 x 2 x 0.1 04/03/16;  the patient's dimensions have gone up to 3 cm in diameter quite a deterioration from last time. He is also complaining of pain in this area which is new. 04/10/16; 3 x 1.5 x 0.1. No difference from  last week. Culture I did last week was negative he has completed antibiotics. 04/24/16 2.4 x 2 x 0.1; wound generally looks smaller. Middle area that I had to debrided last time looks healthier. His son is fashioned a large piece of foam cut out where the patient's wound with hit the back of his wheelchair 05/01/16; wound is a same size however the surface of this looks better. The middle innkeeper area required [facility], CHRISTION LEONHARD. ([id number]) a repeat debridement 05/15/16; wound is down and dimensions granulation still looks healthy. The medial aspect of this wound is now the deeper Divot. We'll see how this responds to further healing. We're using Hydrofera Blue 06/05/16; Wound 1.7x1.1x0.1 still using hyudrofera blue 06/26/16; the patient arrives back in clinic after a three-week hiatus. He was hospitalized from 06/09/16 through 06/10/16. He was found to be confused. CT scan of the head showed nothing really acute. His sodium was low at 131. He was rehydrated. He was felt to have a UTI and given antibiotics and antibiotics at discharge although his final culture result only showed multiple organisms. His wife says today that at the time of the hospitalization they discovered a large intact blister over the large aspect of his left calcaneus. This is recently ruptured. The wound on his right scapula is somewhat larger. More his wife is concerned about his current status. She states that he is still confused sleeps for long periods. He is not having fever chills cough or diarrhea [1 loose bowel movement per day]. He continues to have a suprapubic catheter. She notes that he is not eating and drinking well. Patient states he just does not want to eat. He does not feel nauseated or vomit. 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 Admission sodium was 131 at discharge 139 BUN 22 and 1.22 at admission, 17 and 1 at  discharge. 07/03/16; patient's mental status is back to normal. He has a new wound on the right elbow caused by traumatizing his elbow against the wall apparently at the dermatologist office last week. We continue with the original wound on the right scapula and the wound from 2 weeks ago on his left heel. We have been using silver alginate to the area on the heel. 08/14/16; patient has not been here in almost 6 weeks. When he was here last time he had the pressure ulcer on the right scapula, atraumatic wound on the right lateral elbow and an area on his left heel. His wife states that at one point all of these were healed and she didn't really feel he needed to come back here. Since then the patient has developed a reopening of the area on the right scapula, a stage II wound on the left buttock and a reopening of the area on the right lateral elbow. As usual his wife as a litany of complaints against home health, she is dismissing or is going to dismiss well care. She tells [place] she has a long list of supplies at home already for some reason they were not felt to be eligible for a group 2 or 3 surface although they have a hospital bed at home but no offloading surface 08/28/16; the patient  arrived today unfortunately incontinent of stool in spite of this the wounds all appear to be better including the right scapular area, his left buttock's left heel and the right lateral elbow. Objective Constitutional Patient is hypotensive. Appears well. Pulse regular and within target range for patient.Marland Kitchen Respirations regular, non-labored and within target range.. Temperature is normal and within the target range for the patient.Marland Kitchen appears in no distress. Vitals Time Taken: 11:30 AM, Height: 70 in, Weight: 187 lbs, BMI: 26.8, Temperature: 96.8 F, Pulse: 78 Macquarrie, Maxx C. ([id number]) bpm, Respiratory Rate: 16 breaths/min, Blood Pressure: 88/53 mmHg. General Notes: Wound exam; pressure area over the right  scapula is very small and superficial no debridement is required on any wound area. The area over the left heel according to his wife is still healed. Right elbow wound superficial and appears healthy Stage II over the left buttock I did not see today Integumentary (Hair, Skin) Wound #1 status is Open. Original cause of wound was Pressure Injury. The wound is located on the Right,Proximal Back. The wound measures 0.6cm length x 0.8cm width x 0.1cm depth; 0.377cm^2 area and 0.038cm^3 volume. Wound #3 status is Open. Original cause of wound was Trauma. The wound is located on the Right Elbow. The wound measures 0.5cm length x 1cm width x 0.1cm depth; 0.393cm^2 area and 0.039cm^3 volume. Wound #4 status is Open. Original cause of wound was Pressure Injury. The wound is located on the Left Gluteus. The wound measures 0.5cm length x 0.4cm width x 0.1cm depth; 0.157cm^2 area and 0.016cm^3 volume. 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, stage 3 S14.103S - Unspecified injury at C3 level of cervical spinal cord, sequela S51.011A - Laceration without foreign body of right elbow, initial encounter L89.322 - Pressure ulcer of left buttock, stage 2 Plan Wound Cleansing: Wound #1 Right,Proximal Back: Clean wound with Normal Saline. Wound #3 Right Elbow: Clean wound with Normal Saline. Wound #4 Left Gluteus: Clean wound with Normal Saline. Anesthetic: Wound #1 Right,Proximal Back: Topical Lidocaine 4% cream applied to wound bed prior to debridement AMORI, COLOMB ([id number]) Wound #3 Right Elbow: Topical Lidocaine 4% cream applied to wound bed prior to debridement Wound #4 Left Gluteus: Topical Lidocaine 4% cream applied to wound bed prior to debridement Primary Wound Dressing: Wound #1 Right,Proximal Back: Hydrafera Blue Wound #3 Right Elbow: Hydrafera Blue Wound #4 Left Gluteus: Hydrafera Blue Secondary Dressing: Wound #1 Right,Proximal  Back: Boardered Foam Dressing Wound #3 Right Elbow: Boardered Foam Dressing Wound #4 Left Gluteus: Boardered Foam Dressing Dressing Change Frequency: Wound #1 Right,Proximal Back: Change dressing every other day. Wound #3 Right Elbow: Change dressing every other day. Wound #4 Left Gluteus: Change dressing every other day. Follow-up Appointments: Wound #1 Right,Proximal Back: Return Appointment in 2 weeks. Wound #3 Right Elbow: Return Appointment in 2 weeks. Wound #4 Left Gluteus: Return Appointment in 2 weeks. Off-Loading: Mattress - Order surface for patient Turn and reposition every 2 hours Additional Orders / Instructions: Wound #1 Right,Proximal Back: Increase protein intake. Wound #3 Right Elbow: Increase protein intake. Wound #4 Left Gluteus: Increase protein intake. Medications-please add to medication list.: Wound #1 Right,Proximal Back: Other: - pain medication Wound #3 Right Elbow: Other: - pain medication Wound #4 Left Gluteus: Other: - pain medication Huckeba, ANNIE ROSEBOOM. ([id number]) #1 we continued with Hydrofera Blue to all wound areas #2 miraculously all of these appear to be improving Electronic Signature(s) Signed: 08/28/2016 5:35:48 PM By: Baltazar Najjar MD Entered By: Baltazar Najjar on 08/28/2016  16:45:51 DUANNE, DUCHESNE ([id number]) -------------------------------------------------------------------------------- SuperBill Details Patient Name: JULIUS, MATUS. Date of Service: 08/28/2016 Medical Record Number: [id number] Patient Account Number: [id number] Date of Birth/Sex: 06-25-1950 (66 y.o. Male) Treating RN: Clover Mealy, RN, BSN, [place] Sink Primary Care Provider: Elizabeth Sauer Other Clinician: Referring Provider: Elizabeth Sauer Treating Provider/Extender: Maxwell Caul Weeks in Treatment: 31 Diagnosis Coding ICD-10 Codes Code Description [phone] Pressure ulcer of other site, stage 3 S14.103S Unspecified injury at C3 level of cervical spinal cord,  sequela S51.011A Laceration without foreign body of right elbow, initial encounter L89.322 Pressure ulcer of left buttock, stage 2 Facility Procedures CPT4 Code: [id number] Description: 99214 - WOUND CARE VISIT-LEV 4 EST PT Modifier: Quantity: 1 Physician Procedures CPT4 Code: [id number] Description: 52841 - WC PHYS LEVEL 2 - EST PT ICD-10 Description Diagnosis 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, stage 3 L89.322 Pressure ulcer of left buttock, stage 2 Modifier: Quantity: 1 Electronic Signature(s) Signed: 08/28/2016 5:35:48 PM By: Baltazar Najjar MD Entered By: Baltazar Najjar on 08/28/2016 16:47:19

## 2016-08-30 NOTE — Progress Notes (Addendum)
JORELL, AGNE ([id number]) Visit Report for 08/28/2016 Arrival Information Details Patient Name: [name], [name]. Date of Service: 08/28/2016 11:15 AM Medical Record Number: [id number] Patient Account Number: [id number] Date of Birth/Sex: February 07, 1951 (66 y.o. Male) Treating RN: Baruch Gouty, RN, BSN, Velva Harman Primary Care Donelle Hise: Otilio Miu Other Clinician: Referring Carr Shartzer: Otilio Miu Treating Ausar Georgiou/Extender: Tito Dine in Treatment: 18 Visit Information History Since Last Visit All ordered tests and consults were completed: No Patient Arrived: Wheel Chair Added or deleted any medications: No Arrival Time: 11:18 Any new allergies or adverse reactions: No Accompanied By: wife Had a fall or experienced change in No activities of daily living that may affect Transfer Assistance: None risk of falls: Patient Identification Verified: Yes Signs or symptoms of abuse/neglect since last No Secondary Verification Process Yes visito Completed: Hospitalized since last visit: No Patient Requires Transmission-Based No Has Dressing in Place as Prescribed: Yes Precautions: Pain Present Now: No Patient Has Alerts: Yes Electronic Signature(s) Signed: 08/28/2016 4:25:10 PM By: Regan Lemming BSN, RN Entered By: Regan Lemming on 08/28/2016 11:19:08 [place], Leslye Peer ([id number]) -------------------------------------------------------------------------------- Clinic Level of Care Assessment Details Patient Name: [name], [name] Date of Service: 08/28/2016 11:15 AM Medical Record Number: [id number] Patient Account Number: [id number] Date of Birth/Sex: 04/08/1950 (66 y.o. Male) Treating RN: Baruch Gouty, RN, BSN, [place] Primary Care Anothy Bufano: Otilio Miu Other Clinician: Referring Darry Kelnhofer: Otilio Miu Treating Nickisha Hum/Extender: Tito Dine in Treatment: 31 Clinic Level of Care Assessment Items TOOL 4 Quantity Score '[]'$  - Use when only an EandM is performed on FOLLOW-UP visit  0 ASSESSMENTS - Nursing Assessment / Reassessment X - Reassessment of Co-morbidities (includes updates in patient status) 1 10 X - Reassessment of Adherence to Treatment Plan 1 5 ASSESSMENTS - Wound and Skin Assessment / Reassessment '[]'$  - Simple Wound Assessment / Reassessment - one wound 0 X - Complex Wound Assessment / Reassessment - multiple wounds 3 5 '[]'$  - Dermatologic / Skin Assessment (not related to wound area) 0 ASSESSMENTS - Focused Assessment '[]'$  - Circumferential Edema Measurements - multi extremities 0 '[]'$  - Nutritional Assessment / Counseling / Intervention 0 '[]'$  - Lower Extremity Assessment (monofilament, tuning fork, pulses) 0 '[]'$  - Peripheral Arterial Disease Assessment (using hand held doppler) 0 ASSESSMENTS - Ostomy and/or Continence Assessment and Care X - Incontinence Assessment and Management 1 10 '[]'$  - Ostomy Care Assessment and Management (repouching, etc.) 0 PROCESS - Coordination of Care X - Simple Patient / Family Education for ongoing care 1 15 '[]'$  - Complex (extensive) Patient / Family Education for ongoing care 0 '[]'$  - Staff obtains Programmer, systems, Records, Test Results / Process Orders 0 '[]'$  - Staff telephones HHA, Nursing Homes / Clarify orders / etc 0 '[]'$  - Routine Transfer to another Facility (non-emergent condition) 0 [name], [name] ([id number]) '[]'$  - Routine Hospital Admission (non-emergent condition) 0 '[]'$  - New Admissions / Biomedical engineer / Ordering NPWT, Apligraf, etc. 0 '[]'$  - Emergency Hospital Admission (emergent condition) 0 '[]'$  - Simple Discharge Coordination 0 '[]'$  - Complex (extensive) Discharge Coordination 0 PROCESS - Special Needs '[]'$  - Pediatric / Minor Patient Management 0 '[]'$  - Isolation Patient Management 0 '[]'$  - Hearing / Language / Visual special needs 0 '[]'$  - Assessment of Community assistance (transportation, D/C planning, etc.) 0 '[]'$  - Additional assistance / Altered mentation 0 '[]'$  - Support Surface(s) Assessment (bed, cushion, seat, etc.)  0 INTERVENTIONS - Wound Cleansing / Measurement '[]'$  - Simple Wound Cleansing - one wound 0 X - Complex Wound Cleansing - multiple wounds  3 5 X - Wound Imaging (photographs - any number of wounds) 1 5 '[]'$  - Wound Tracing (instead of photographs) 0 '[]'$  - Simple Wound Measurement - one wound 0 X - Complex Wound Measurement - multiple wounds 3 5 INTERVENTIONS - Wound Dressings X - Small Wound Dressing one or multiple wounds 3 10 '[]'$  - Medium Wound Dressing one or multiple wounds 0 '[]'$  - Large Wound Dressing one or multiple wounds 0 '[]'$  - Application of Medications - topical 0 '[]'$  - Application of Medications - injection 0 INTERVENTIONS - Miscellaneous '[]'$  - External ear exam 0 [name], [name] ([id number]) '[]'$  - Specimen Collection (cultures, biopsies, blood, body fluids, etc.) 0 '[]'$  - Specimen(s) / Culture(s) sent or taken to Lab for analysis 0 X - Patient Transfer (multiple staff / Harrel Lemon Lift / Similar devices) 1 10 '[]'$  - Simple Staple / Suture removal (25 or less) 0 '[]'$  - Complex Staple / Suture removal (26 or more) 0 '[]'$  - Hypo / Hyperglycemic Management (close monitor of Blood Glucose) 0 '[]'$  - Ankle / Brachial Index (ABI) - do not check if billed separately 0 X - Vital Signs 1 5 Has the patient been seen at the hospital within the last three years: Yes Total Score: 135 Level Of Care: New/Established - Level 4 Electronic Signature(s) Signed: 08/28/2016 4:25:10 PM By: Regan Lemming BSN, RN Entered By: Regan Lemming on 08/28/2016 12:12:46 [place], Leslye Peer ([id number]) -------------------------------------------------------------------------------- Encounter Discharge Information Details Patient Name: [name] Coast C. Date of Service: 08/28/2016 11:15 AM Medical Record Number: [id number] Patient Account Number: [id number] Date of Birth/Sex: 20-Jul-1950 (66 y.o. Male) Treating RN: Baruch Gouty, RN, BSN, Velva Harman Primary Care Kenzel Ruesch: Otilio Miu Other Clinician: Referring Chyann Ambrocio: Otilio Miu Treating  Rj Pedrosa/Extender: Tito Dine in Treatment: 31 Encounter Discharge Information Items Discharge Pain Level: 0 Discharge Condition: Stable Ambulatory Status: Wheelchair Discharge Destination: Home Transportation: Private Auto Accompanied By: wife Schedule Follow-up Appointment: No Medication Reconciliation completed No and provided to Patient/Care Curlie Macken: Patient Clinical Summary of Care: Declined Electronic Signature(s) Signed: 08/28/2016 4:25:10 PM By: Regan Lemming BSN, RN Previous Signature: 08/28/2016 12:06:13 PM Version By: Ruthine Dose Entered By: Regan Lemming on 08/28/2016 12:13:34 [place], Leslye Peer ([id number]) -------------------------------------------------------------------------------- General Visit Notes Details Patient Name: [name] Coast C. Date of Service: 08/28/2016 11:15 AM Medical Record Number: [id number] Patient Account Number: [id number] Date of Birth/Sex: 1950-09-29 (66 y.o. Male) Treating RN: Baruch Gouty, RN, BSN, Velva Harman Primary Care Keerthana Vanrossum: Otilio Miu Other Clinician: Referring Drishti Pepperman: Otilio Miu Treating Hartlee Amedee/Extender: Ricard Dillon Weeks in Treatment: 31 Notes Extensive assistance provide on this visit. Patient had incontinent episode on himself, on his chair, on the floor. Moved him to the Bathroom and cleaned him up with assistance from wife. Electronic Signature(s) Signed: 08/28/2016 4:39:32 PM By: Regan Lemming BSN, RN Entered By: Regan Lemming on 08/28/2016 16:39:32 [place], Leslye Peer ([id number]) -------------------------------------------------------------------------------- Lower Extremity Assessment Details Patient Name: [name], [name] Date of Service: 08/28/2016 11:15 AM Medical Record Number: [id number] Patient Account Number: [id number] Date of Birth/Sex: [birth year] (66 y.o. Male) Treating RN: Baruch Gouty, RN, BSN, Velva Harman Primary Care Linley Moxley: Otilio Miu Other Clinician: Referring Tabitha Tupper: Otilio Miu Treating  Ovetta Bazzano/Extender: Ricard Dillon Weeks in Treatment: 31 Electronic Signature(s) Signed: 08/28/2016 4:25:10 PM By: Regan Lemming BSN, RN Entered By: Regan Lemming on 08/28/2016 12:10:13 [place], Leslye Peer ([id number]) -------------------------------------------------------------------------------- Multi Wound Chart Details Patient Name: [name], [name] Date of Service: 08/28/2016 11:15 AM Medical Record Number: [id number] Patient Account Number: [id number] Date of Birth/Sex: 07-Oct-1950 (66 y.o. Male) Treating RN: Afful,  RN, BSN, Velva Harman Primary Care Finnlee Silvernail: Otilio Miu Other Clinician: Referring Worthington Cruzan: Otilio Miu Treating Draya Felker/Extender: Tito Dine in Treatment: 31 Vital Signs Height(in): 70 Pulse(bpm): 78 Weight(lbs): 187 Blood Pressure 88/53 (mmHg): Body Mass Index(BMI): 27 Temperature(F): 96.8 Respiratory Rate 16 (breaths/min): Photos: [4:No Photos] Wound Location: Right, Proximal Back Right Elbow Left Gluteus Wounding Event: Pressure Injury Trauma Pressure Injury Primary Etiology: Pressure Ulcer Trauma, Other Pressure Ulcer Date Acquired: 11/24/2015 06/28/2016 07/09/2016 Weeks of Treatment: '31 8 2 '$ Wound Status: Open Open Open Measurements L x W x D 0.6x0.8x0.1 0.5x1x0.1 0.5x0.4x0.1 (cm) Area (cm) : 0.377 0.393 0.157 Volume (cm) : 0.038 0.039 0.016 % Reduction in Area: 95.30% 86.80% 0.00% % Reduction in Volume: 95.30% 86.90% 0.00% Classification: Category/Stage II Partial Thickness Category/Stage II Periwound Skin Texture: No Abnormalities Noted No Abnormalities Noted No Abnormalities Noted Periwound Skin No Abnormalities Noted No Abnormalities Noted No Abnormalities Noted Moisture: Periwound Skin Color: No Abnormalities Noted No Abnormalities Noted No Abnormalities Noted Tenderness on No No No Palpation: Treatment Notes Wound #1 (Right, Proximal Back) Goodwill, Gomer C. ([id number]) 1. Cleansed with: Clean wound with Normal Saline 4. Dressing  Applied: Hydrafera Blue 5. Secondary Dressing Applied Bordered Foam Dressing Dry Gauze Wound #3 (Right Elbow) 1. Cleansed with: Clean wound with Normal Saline 4. Dressing Applied: Hydrafera Blue 5. Secondary Dressing Applied Bordered Foam Dressing Dry Gauze Wound #4 (Left Gluteus) 1. Cleansed with: Clean wound with Normal Saline 4. Dressing Applied: Hydrafera Blue 5. Secondary Dressing Applied Bordered Foam Dressing Dry Gauze Electronic Signature(s) Signed: 08/28/2016 5:35:48 PM By: Linton Ham MD Previous Signature: 08/28/2016 4:25:10 PM Version By: Regan Lemming BSN, RN Entered By: Linton Ham on 08/28/2016 16:40:02 [place], Leslye Peer ([id number]) -------------------------------------------------------------------------------- Multi-Disciplinary Care Plan Details Patient Name: [name], [name] Date of Service: 08/28/2016 11:15 AM Medical Record Number: [id number] Patient Account Number: [id number] Date of Birth/Sex: 04/19/1950 (66 y.o. Male) Treating RN: Baruch Gouty, RN, BSN, Velva Harman Primary Care Daxon Kyne: Otilio Miu Other Clinician: Referring Man Effertz: Otilio Miu Treating Ayasha Ellingsen/Extender: Tito Dine in Treatment: 31 Active Inactive ` Nutrition Nursing Diagnoses: Imbalanced nutrition Impaired glucose control: actual or potential Goals: Patient/caregiver agrees to and verbalizes understanding of need to use nutritional supplements and/or vitamins as prescribed Date Initiated: 01/24/2016 Target Resolution Date: 03/27/2016 Goal Status: Active Patient/caregiver will maintain therapeutic glucose control Date Initiated: 01/24/2016 Target Resolution Date: 03/27/2016 Goal Status: Active Interventions: Assess patient nutrition upon admission and as needed per policy Provide education on elevated blood sugars and impact on wound healing Notes: ` Orientation to the Wound Care Program Nursing Diagnoses: Knowledge deficit related to the wound healing center  program Goals: Patient/caregiver will verbalize understanding of the [place] Date Initiated: 01/24/2016 Target Resolution Date: 03/27/2016 Goal Status: Active Interventions: Provide education on orientation to the wound center Notes: CARRELL, RAHMANI ([id number]) ` Pain, Acute or Chronic Nursing Diagnoses: Pain, acute or chronic: actual or potential Potential alteration in comfort, pain Goals: Patient will verbalize adequate pain control and receive pain control interventions during procedures as needed Date Initiated: 01/24/2016 Target Resolution Date: 03/27/2016 Goal Status: Active Patient/caregiver will verbalize adequate pain control between visits Date Initiated: 01/24/2016 Target Resolution Date: 03/27/2016 Goal Status: Active Patient/caregiver will verbalize comfort level met Date Initiated: 01/24/2016 Target Resolution Date: 03/27/2016 Goal Status: Active Interventions: Assess comfort goal upon admission Complete pain assessment as per visit requirements Notes: ` Pressure Nursing Diagnoses: Knowledge deficit related to management of pressures ulcers Potential for impaired tissue integrity related to pressure, friction, moisture, and shear Goals:  Patient will remain free from development of additional pressure ulcers Date Initiated: 01/24/2016 Target Resolution Date: 03/27/2016 Goal Status: Active Interventions: Assess: immobility, friction, shearing, incontinence upon admission and as needed Assess offloading mechanisms upon admission and as needed Assess potential for pressure ulcer upon admission and as needed Provide education on pressure ulcers Notes: [name], [name] ([id number]) Wound/Skin Impairment Nursing Diagnoses: Impaired tissue integrity Goals: Ulcer/skin breakdown will have a volume reduction of 30% by week 4 Date Initiated: 01/24/2016 Target Resolution Date: 03/27/2016 Goal Status: Active Ulcer/skin breakdown will have  a volume reduction of 50% by week 8 Date Initiated: 01/24/2016 Target Resolution Date: 03/27/2016 Goal Status: Active Ulcer/skin breakdown will have a volume reduction of 80% by week 12 Date Initiated: 01/24/2016 Target Resolution Date: 03/27/2016 Goal Status: Active Interventions: Assess patient/caregiver ability to perform ulcer/skin care regimen upon admission and as needed Assess ulceration(s) every visit Notes: Electronic Signature(s) Signed: 08/28/2016 4:25:10 PM By: Elpidio Eric BSN, RN Entered By: Elpidio Eric on 08/28/2016 12:10:23 [name], [name] ([id number]) -------------------------------------------------------------------------------- Pain Assessment Details Patient Name: [name] Bras C. Date of Service: 08/28/2016 11:15 AM Medical Record Number: [id number] Patient Account Number: [id number] Date of Birth/Sex: 1951/02/20 (66 y.o. Male) Treating RN: Clover Mealy, RN, BSN, [place] Sink Primary Care Karlye Ihrig: Elizabeth Sauer Other Clinician: Referring Graziella Connery: Elizabeth Sauer Treating Jontrell Bushong/Extender: Maxwell Caul Weeks in Treatment: 31 Active Problems Location of Pain Severity and Description of Pain Patient Has Paino No Site Locations With Dressing Change: No Pain Management and Medication Current Pain Management: Electronic Signature(s) Signed: 08/28/2016 4:25:10 PM By: Elpidio Eric BSN, RN Entered By: Elpidio Eric on 08/28/2016 11:19:23 [name], [name] ([id number]) -------------------------------------------------------------------------------- Patient/Caregiver Education Details Patient Name: [name]. Date of Service: 08/28/2016 11:15 AM Medical Record Number: [id number] Patient Account Number: [id number] Date of Birth/Gender: [birth year] (66 y.o. Male) Treating RN: Clover Mealy, RN, BSN, [place] Sink Primary Care Physician: Elizabeth Sauer Other Clinician: Referring Physician: Elizabeth Sauer Treating Physician/Extender: Altamese [place] in Treatment: 31 Education  Assessment Education Provided To: Patient and Caregiver wife Education Topics Provided Elevated Blood Sugar/ Impact on Healing: Methods: Explain/Verbal Responses: Reinforcements needed Pressure: Methods: Explain/Verbal Responses: Reinforcements needed Welcome To The Wound Care Center: Methods: Explain/Verbal Responses: Reinforcements needed Wound/Skin Impairment: Methods: Explain/Verbal Responses: Reinforcements needed Electronic Signature(s) Signed: 08/28/2016 4:25:10 PM By: Elpidio Eric BSN, RN Entered By: Elpidio Eric on 08/28/2016 12:14:06 [place], [name] ([id number]) -------------------------------------------------------------------------------- Wound Assessment Details Patient Name: [name], [name] Date of Service: 08/28/2016 11:15 AM Medical Record Number: [id number] Patient Account Number: [id number] Date of Birth/Sex: Sep 30, 1950 (66 y.o. Male) Treating RN: Clover Mealy, RN, BSN, [place] Primary Care [name] [name]: Elizabeth Sauer Other Clinician: Referring Clella Mckeel: Elizabeth Sauer Treating Judaea Burgoon/Extender: Maxwell Caul Weeks in Treatment: 31 Wound Status Wound Number: 1 Primary Pressure Ulcer Etiology: Wound Location: Right Back - Proximal Wound Open Wounding Event: Pressure Injury Status: Date Acquired: 11/24/2015 Comorbid Coronary Artery Disease, Type II Weeks Of Treatment: 31 History: Diabetes, Quadriplegia, Seizure Clustered Wound: No Disorder Photos Wound Measurements Length: (cm) 0.6 Width: (cm) 0.8 Depth: (cm) 0.1 Area: (cm) 0.377 Volume: (cm) 0.038 % Reduction in Area: 95.3% % Reduction in Volume: 95.3% Epithelialization: Small (1-33%) Tunneling: No Undermining: No Wound Description Classification: Category/Stage II Foul Odor Afte Wound Margin: Flat and Intact Slough/Fibrino Exudate Amount: Medium Exudate Type: Serous Exudate Color: amber r Cleansing: No No Wound Bed Granulation Amount: Large (67-100%) Exposed Structure Granulation Quality: Red,  Pink Fascia Exposed: No Necrotic Amount: None Present (0%) Fat Layer (Subcutaneous Tissue) Exposed: Yes Tendon Exposed: No Muscle Exposed:  No [place], SHONTE SODERLUND ([id number]) Joint Exposed: No Bone Exposed: No Periwound Skin Texture Texture Color No Abnormalities Noted: No No Abnormalities Noted: No Callus: No Atrophie Blanche: No Crepitus: No Cyanosis: No Excoriation: Yes Ecchymosis: No Induration: No Erythema: Yes Rash: No Erythema Location: Circumferential Scarring: No Hemosiderin Staining: No Mottled: No Moisture Pallor: No No Abnormalities Noted: No Rubor: No Dry / Scaly: No Maceration: No Temperature / Pain Temperature: No Abnormality Tenderness on Palpation: Yes Wound Preparation Ulcer Cleansing: Rinsed/Irrigated with Saline Topical Anesthetic Applied: Other: lidocaine 4%, Treatment Notes Wound #1 (Right, Proximal Back) 1. Cleansed with: Clean wound with Normal Saline 4. Dressing Applied: Hydrafera Blue 5. Secondary Dressing Applied Bordered Foam Dressing Dry Gauze Electronic Signature(s) Signed: 09/04/2016 8:53:46 AM By: Regan Lemming BSN, RN Previous Signature: 08/28/2016 4:25:10 PM Version By: Regan Lemming BSN, RN Entered By: Regan Lemming on 09/04/2016 08:53:45 [place], Leslye Peer ([id number]) -------------------------------------------------------------------------------- Wound Assessment Details Patient Name: Pantoja, Claire C. Date of Service: 08/28/2016 11:15 AM Medical Record Number: [id number] Patient Account Number: [id number] Date of Birth/Sex: 12/22/1950 (66 y.o. Male) Treating RN: Baruch Gouty, RN, BSN, [place] Primary Care Jaena Brocato: Otilio Miu Other Clinician: Referring Saabir Blyth: Otilio Miu Treating Kenzley Ke/Extender: Ricard Dillon Weeks in Treatment: 31 Wound Status Wound Number: 3 Primary Trauma, Other Etiology: Wound Location: Right Elbow Wound Open Wounding Event: Trauma Status: Date Acquired: 06/28/2016 Comorbid Coronary Artery Disease,  Type II Weeks Of Treatment: 8 History: Diabetes, Quadriplegia, Seizure Clustered Wound: No Disorder Photos Wound Measurements Length: (cm) 0.5 Width: (cm) 1 Depth: (cm) 0.1 Area: (cm) 0.393 Volume: (cm) 0.039 % Reduction in Area: 86.8% % Reduction in Volume: 86.9% Wound Description Classification: Partial Thickness Foul Odor Afte Wound Margin: Flat and Intact Slough/Fibrino Exudate Amount: Medium Exudate Type: Sanguinous Exudate Color: red r Cleansing: No No Wound Bed Granulation Amount: Large (67-100%) Exposed Structure Granulation Quality: Red Fascia Exposed: No Necrotic Amount: None Present (0%) Fat Layer (Subcutaneous Tissue) Exposed: No Tendon Exposed: No Muscle Exposed: No Huss, Hoke C. ([id number]) Joint Exposed: No Bone Exposed: No Periwound Skin Texture Texture Color No Abnormalities Noted: No No Abnormalities Noted: No Callus: No Atrophie Blanche: No Crepitus: No Cyanosis: No Excoriation: No Ecchymosis: Yes Induration: No Erythema: No Rash: No Hemosiderin Staining: No Scarring: No Mottled: No Pallor: No Moisture Rubor: No No Abnormalities Noted: No Dry / Scaly: No Temperature / Pain Maceration: No Temperature: No Abnormality Tenderness on Palpation: Yes Wound Preparation Ulcer Cleansing: Rinsed/Irrigated with Saline Topical Anesthetic Applied: None Treatment Notes Wound #3 (Right Elbow) 1. Cleansed with: Clean wound with Normal Saline 4. Dressing Applied: Hydrafera Blue 5. Secondary Dressing Applied Bordered Foam Dressing Dry Gauze Electronic Signature(s) Signed: 08/31/2016 8:39:45 AM By: Regan Lemming BSN, RN Previous Signature: 08/28/2016 4:25:10 PM Version By: Regan Lemming BSN, RN Entered By: Regan Lemming on 08/31/2016 08:39:45 Mezera, Leslye Peer ([id number]) -------------------------------------------------------------------------------- Wound Assessment Details Patient Name: Helminiak, Zedekiah C. Date of Service: 08/28/2016 11:15  AM Medical Record Number: [id number] Patient Account Number: [id number] Date of Birth/Sex: 1950-06-29 (66 y.o. Male) Treating RN: Baruch Gouty, RN, BSN, [place] Primary Care Selmer Adduci: Otilio Miu Other Clinician: Referring Selah Klang: Otilio Miu Treating Darren Nodal/Extender: Ricard Dillon Weeks in Treatment: 31 Wound Status Wound Number: 4 Primary Pressure Ulcer Etiology: Wound Location: Left Gluteus Wound Open Wounding Event: Pressure Injury Status: Date Acquired: 07/09/2016 Comorbid Coronary Artery Disease, Type II Weeks Of Treatment: 2 History: Diabetes, Quadriplegia, Seizure Clustered Wound: No Disorder Wound Measurements Length: (cm) 0.5 Width: (cm) 0.4 Depth: (cm) 0.1 Area: (cm) 0.157 Volume: (cm) 0.016 % Reduction in  Area: 0% % Reduction in Volume: 0% Epithelialization: Small (1-33%) Wound Description Classification: Category/Stage II Wound Margin: Indistinct, nonvisible Exudate Amount: Medium Exudate Type: Serous Exudate Color: amber Foul Odor After Cleansing: No Slough/Fibrino Yes Wound Bed Granulation Amount: Medium (34-66%) Exposed Structure Granulation Quality: Pink Fascia Exposed: No Necrotic Amount: Small (1-33%) Fat Layer (Subcutaneous Tissue) Exposed: Yes Necrotic Quality: Adherent Slough Tendon Exposed: No Muscle Exposed: No Joint Exposed: No Bone Exposed: No Periwound Skin Texture Texture Color No Abnormalities Noted: No No Abnormalities Noted: No Induration: Yes Rubor: Yes Moisture Temperature / Pain No Abnormalities Noted: No Temperature: No Abnormality Tenderness on Palpation: Yes Gilder, Keagan C. ([id number]) Wound Preparation Ulcer Cleansing: Rinsed/Irrigated with Saline Topical Anesthetic Applied: None Treatment Notes Wound #4 (Left Gluteus) 1. Cleansed with: Clean wound with Normal Saline 4. Dressing Applied: Hydrafera Blue 5. Secondary Dressing Applied Bordered Foam Dressing Dry Gauze Electronic Signature(s) Signed:  08/31/2016 8:40:08 AM By: Regan Lemming BSN, RN Previous Signature: 08/28/2016 4:25:10 PM Version By: Regan Lemming BSN, RN Entered By: Regan Lemming on 08/31/2016 08:40:08 [place]ays, Leslye Peer ([id number]) -------------------------------------------------------------------------------- [place] Details Patient Name: [name] Coast C. Date of Service: 08/28/2016 11:15 AM Medical Record Number: [id number] Patient Account Number: [id number] Date of Birth/Sex: 01-24-1951 (66 y.o. Male) Treating RN: Baruch Gouty, RN, BSN, [place] Primary Care Nyomie Ehrlich: Otilio Miu Other Clinician: Referring Nyra Anspaugh: Otilio Miu Treating Aryiana Klinkner/Extender: Ricard Dillon Weeks in Treatment: 31 Vital Signs Time Taken: 11:30 Temperature (F): 96.8 Height (in): 70 Pulse (bpm): 78 Weight (lbs): 187 Respiratory Rate (breaths/min): 16 Body Mass Index (BMI): 26.8 Blood Pressure (mmHg): 88/53 Reference Range: 80 - 120 mg / dl Electronic Signature(s) Signed: 08/28/2016 4:25:10 PM By: Regan Lemming BSN, RN Entered By: Regan Lemming on 08/28/2016 12:10:03

## 2016-09-03 DIAGNOSIS — L89130 Pressure ulcer of right lower back, unstageable: Secondary | ICD-10-CM | POA: Diagnosis not present

## 2016-09-11 ENCOUNTER — Ambulatory Visit: Payer: Medicare Other | Admitting: Internal Medicine

## 2016-09-17 DIAGNOSIS — G8250 Quadriplegia, unspecified: Secondary | ICD-10-CM | POA: Diagnosis not present

## 2016-09-17 DIAGNOSIS — L89323 Pressure ulcer of left buttock, stage 3: Secondary | ICD-10-CM | POA: Diagnosis not present

## 2016-09-18 ENCOUNTER — Inpatient Hospital Stay
Admission: EM | Admit: 2016-09-18 | Discharge: 2016-09-20 | DRG: 602 | Disposition: A | Discharge status: Home / Self Care | Payer: Medicare Other | Attending: Internal Medicine | Admitting: Internal Medicine

## 2016-09-18 ENCOUNTER — Inpatient Hospital Stay: Payer: Medicare Other

## 2016-09-18 ENCOUNTER — Encounter: Payer: Self-pay | Admitting: Emergency Medicine

## 2016-09-18 ENCOUNTER — Encounter: Payer: Medicare Other | Admitting: Internal Medicine

## 2016-09-18 DIAGNOSIS — Z7902 Long term (current) use of antithrombotics/antiplatelets: Secondary | ICD-10-CM | POA: Diagnosis not present

## 2016-09-18 DIAGNOSIS — L89313 Pressure ulcer of right buttock, stage 3: Secondary | ICD-10-CM | POA: Diagnosis not present

## 2016-09-18 DIAGNOSIS — L8943 Pressure ulcer of contiguous site of back, buttock and hip, stage 3: Secondary | ICD-10-CM | POA: Diagnosis not present

## 2016-09-18 DIAGNOSIS — I2510 Atherosclerotic heart disease of native coronary artery without angina pectoris: Secondary | ICD-10-CM | POA: Diagnosis present

## 2016-09-18 DIAGNOSIS — L03317 Cellulitis of buttock: Principal | ICD-10-CM | POA: Diagnosis present

## 2016-09-18 DIAGNOSIS — Z7984 Long term (current) use of oral hypoglycemic drugs: Secondary | ICD-10-CM

## 2016-09-18 DIAGNOSIS — Z930 Tracheostomy status: Secondary | ICD-10-CM | POA: Diagnosis not present

## 2016-09-18 DIAGNOSIS — K219 Gastro-esophageal reflux disease without esophagitis: Secondary | ICD-10-CM | POA: Diagnosis present

## 2016-09-18 DIAGNOSIS — Z823 Family history of stroke: Secondary | ICD-10-CM

## 2016-09-18 DIAGNOSIS — E785 Hyperlipidemia, unspecified: Secondary | ICD-10-CM | POA: Diagnosis present

## 2016-09-18 DIAGNOSIS — I10 Essential (primary) hypertension: Secondary | ICD-10-CM | POA: Diagnosis not present

## 2016-09-18 DIAGNOSIS — L8990 Pressure ulcer of unspecified site, unspecified stage: Secondary | ICD-10-CM | POA: Diagnosis present

## 2016-09-18 DIAGNOSIS — E119 Type 2 diabetes mellitus without complications: Secondary | ICD-10-CM | POA: Diagnosis not present

## 2016-09-18 DIAGNOSIS — G8250 Quadriplegia, unspecified: Secondary | ICD-10-CM | POA: Diagnosis not present

## 2016-09-18 DIAGNOSIS — L89319 Pressure ulcer of right buttock, unspecified stage: Secondary | ICD-10-CM

## 2016-09-18 DIAGNOSIS — Z7401 Bed confinement status: Secondary | ICD-10-CM

## 2016-09-18 DIAGNOSIS — S3993XA Unspecified injury of pelvis, initial encounter: Secondary | ICD-10-CM | POA: Diagnosis not present

## 2016-09-18 DIAGNOSIS — Z8673 Personal history of transient ischemic attack (TIA), and cerebral infarction without residual deficits: Secondary | ICD-10-CM

## 2016-09-18 DIAGNOSIS — L89322 Pressure ulcer of left buttock, stage 2: Secondary | ICD-10-CM | POA: Diagnosis not present

## 2016-09-18 LAB — COMPREHENSIVE METABOLIC PANEL
ALBUMIN: 2.8 g/dL — AB (ref 3.5–5.0)
ALK PHOS: 109 U/L (ref 38–126)
ALT: 16 U/L — ABNORMAL LOW (ref 17–63)
ANION GAP: 8 (ref 5–15)
AST: 22 U/L (ref 15–41)
BUN: 13 mg/dL (ref 6–20)
CALCIUM: 8.5 mg/dL — AB (ref 8.9–10.3)
CO2: 24 mmol/L (ref 22–32)
Chloride: 98 mmol/L — ABNORMAL LOW (ref 101–111)
Creatinine, Ser: 0.89 mg/dL (ref 0.61–1.24)
GFR calc non Af Amer: >60 mL/min (ref >60)
Glucose, Bld: 83 mg/dL (ref 65–99)
POTASSIUM: 4.3 mmol/L (ref 3.5–5.1)
SODIUM: 130 mmol/L — AB (ref 135–145)
Total Bilirubin: 0.6 mg/dL (ref 0.3–1.2)
Total Protein: 7.6 g/dL (ref 6.5–8.1)

## 2016-09-18 LAB — CBC WITH DIFFERENTIAL/PLATELET
BASOS ABS: 0.1 10*3/uL (ref 0–0.1)
BASOS PCT: 1 %
Eosinophils Absolute: 0.3 10*3/uL (ref 0–0.7)
Eosinophils Relative: 2 %
HEMATOCRIT: 30.1 % — AB (ref 40.0–52.0)
HEMOGLOBIN: 9.9 g/dL — AB (ref 13.0–18.0)
LYMPHS PCT: 19 %
Lymphs Abs: 2.5 10*3/uL (ref 1.0–3.6)
MCH: 27.7 pg (ref 26.0–34.0)
MCHC: 32.8 g/dL (ref 32.0–36.0)
MCV: 84.4 fL (ref 80.0–100.0)
MONOS PCT: 10 %
Monocytes Absolute: 1.3 10*3/uL — ABNORMAL HIGH (ref 0.2–1.0)
NEUTROS ABS: 8.7 10*3/uL — AB (ref 1.4–6.5)
NEUTROS PCT: 68 %
Platelets: 389 10*3/uL (ref 150–440)
RBC: 3.57 MIL/uL — ABNORMAL LOW (ref 4.40–5.90)
RDW: 17.5 % — ABNORMAL HIGH (ref 11.5–14.5)
WBC: 12.8 10*3/uL — ABNORMAL HIGH (ref 3.8–10.6)

## 2016-09-18 LAB — VANCOMYCIN, TROUGH: VANCOMYCIN TR: 21 ug/mL — AB (ref 15–20)

## 2016-09-18 LAB — SEDIMENTATION RATE: Sed Rate: 57 mm/hr — ABNORMAL HIGH (ref 0–20)

## 2016-09-18 LAB — GLUCOSE, CAPILLARY: Glucose-Capillary: 249 mg/dL — ABNORMAL HIGH (ref 65–99)

## 2016-09-18 MED ORDER — ONDANSETRON HCL 4 MG PO TABS: 4.0000 mg | ORAL_TABLET | Freq: Four times a day (QID) | ORAL | Status: DC | PRN

## 2016-09-18 MED ORDER — DIAZEPAM 5 MG PO TABS: 5.0000 mg | ORAL_TABLET | Freq: Three times a day (TID) | ORAL | Status: DC | PRN

## 2016-09-18 MED ORDER — CITALOPRAM HYDROBROMIDE 20 MG PO TABS
40.0000 mg | ORAL_TABLET | Freq: Every day | ORAL | Status: DC
  Administered 2016-09-19 – 2016-09-20 (×2): 40 mg via ORAL
  Filled 2016-09-18 (×2): qty 2

## 2016-09-18 MED ORDER — ONDANSETRON HCL 4 MG/2ML IJ SOLN: 4.0000 mg | Freq: Four times a day (QID) | INTRAMUSCULAR | Status: DC | PRN

## 2016-09-18 MED ORDER — PNEUMOCOCCAL VAC POLYVALENT 25 MCG/0.5ML IJ INJ
0.5000 mL | INJECTION | INTRAMUSCULAR | Status: DC
  Filled 2016-09-18: qty 0.5

## 2016-09-18 MED ORDER — ACETAMINOPHEN 650 MG RE SUPP: 650.0000 mg | Freq: Four times a day (QID) | RECTAL | Status: DC | PRN

## 2016-09-18 MED ORDER — ENOXAPARIN SODIUM 40 MG/0.4ML ~~LOC~~ SOLN
40.0000 mg | SUBCUTANEOUS | Status: DC
  Administered 2016-09-18 – 2016-09-19 (×2): 40 mg via SUBCUTANEOUS
  Filled 2016-09-18 (×2): qty 0.4

## 2016-09-18 MED ORDER — ACETAMINOPHEN 325 MG PO TABS: 650.0000 mg | ORAL_TABLET | Freq: Four times a day (QID) | ORAL | Status: DC | PRN

## 2016-09-18 MED ORDER — PIPERACILLIN-TAZOBACTAM 3.375 G IVPB 30 MIN
3.3750 g | Freq: Once | INTRAVENOUS | Status: AC
  Administered 2016-09-18: 3.375 g via INTRAVENOUS
  Filled 2016-09-18: qty 50

## 2016-09-18 MED ORDER — SODIUM CHLORIDE 0.9% FLUSH: 3.0000 mL | INTRAVENOUS | Status: DC | PRN

## 2016-09-18 MED ORDER — FUROSEMIDE 20 MG PO TABS
20.0000 mg | ORAL_TABLET | Freq: Every day | ORAL | Status: DC
  Administered 2016-09-19 – 2016-09-20 (×2): 20 mg via ORAL
  Filled 2016-09-18 (×2): qty 1

## 2016-09-18 MED ORDER — PANTOPRAZOLE SODIUM 40 MG PO TBEC
40.0000 mg | DELAYED_RELEASE_TABLET | Freq: Every day | ORAL | Status: DC
  Administered 2016-09-19: 40 mg via ORAL
  Filled 2016-09-18 (×2): qty 1

## 2016-09-18 MED ORDER — VANCOMYCIN HCL IN DEXTROSE 1-5 GM/200ML-% IV SOLN
1000.0000 mg | Freq: Two times a day (BID) | INTRAVENOUS | Status: DC
  Administered 2016-09-19 – 2016-09-20 (×3): 1000 mg via INTRAVENOUS
  Filled 2016-09-18 (×5): qty 200

## 2016-09-18 MED ORDER — VANCOMYCIN HCL IN DEXTROSE 1-5 GM/200ML-% IV SOLN
1000.0000 mg | Freq: Once | INTRAVENOUS | Status: AC
  Administered 2016-09-18: 1000 mg via INTRAVENOUS
  Filled 2016-09-18: qty 200

## 2016-09-18 MED ORDER — CLOPIDOGREL BISULFATE 75 MG PO TABS
75.0000 mg | ORAL_TABLET | Freq: Every day | ORAL | Status: DC
  Administered 2016-09-19 – 2016-09-20 (×2): 75 mg via ORAL
  Filled 2016-09-18 (×2): qty 1

## 2016-09-18 MED ORDER — PIPERACILLIN-TAZOBACTAM 3.375 G IVPB
3.3750 g | Freq: Three times a day (TID) | INTRAVENOUS | Status: DC
  Administered 2016-09-19 – 2016-09-20 (×4): 3.375 g via INTRAVENOUS
  Filled 2016-09-18 (×4): qty 50

## 2016-09-18 MED ORDER — INSULIN ASPART 100 UNIT/ML ~~LOC~~ SOLN
0.0000 [IU] | Freq: Three times a day (TID) | SUBCUTANEOUS | Status: DC
  Administered 2016-09-19: 1 [IU] via SUBCUTANEOUS
  Filled 2016-09-18: qty 1

## 2016-09-18 MED ORDER — METFORMIN HCL 500 MG PO TABS
500.0000 mg | ORAL_TABLET | Freq: Two times a day (BID) | ORAL | Status: DC
  Administered 2016-09-19 – 2016-09-20 (×3): 500 mg via ORAL
  Filled 2016-09-18 (×3): qty 1

## 2016-09-18 MED ORDER — SODIUM CHLORIDE 0.9 % IV SOLN
250.0000 mL | INTRAVENOUS | Status: DC | PRN
Start: 2016-09-18 — End: 2016-09-20

## 2016-09-18 MED ORDER — LEVETIRACETAM 750 MG PO TABS
750.0000 mg | ORAL_TABLET | Freq: Three times a day (TID) | ORAL | Status: DC
  Administered 2016-09-18 – 2016-09-20 (×5): 750 mg via ORAL
  Filled 2016-09-18 (×7): qty 1

## 2016-09-18 MED ORDER — HYDROCODONE-ACETAMINOPHEN 5-325 MG PO TABS: 1.0000 | ORAL_TABLET | ORAL | Status: DC | PRN

## 2016-09-18 MED ORDER — BACLOFEN 10 MG PO TABS
40.0000 mg | ORAL_TABLET | Freq: Every day | ORAL | Status: DC
  Administered 2016-09-18 – 2016-09-19 (×2): 40 mg via ORAL
  Filled 2016-09-18 (×3): qty 4

## 2016-09-18 MED ORDER — OXYBUTYNIN CHLORIDE 5 MG PO TABS
5.0000 mg | ORAL_TABLET | Freq: Two times a day (BID) | ORAL | Status: DC
  Administered 2016-09-18 – 2016-09-20 (×4): 5 mg via ORAL
  Filled 2016-09-18 (×4): qty 1

## 2016-09-18 MED ORDER — SODIUM CHLORIDE 0.9% FLUSH
3.0000 mL | Freq: Two times a day (BID) | INTRAVENOUS | Status: DC
  Administered 2016-09-18 – 2016-09-20 (×4): 3 mL via INTRAVENOUS

## 2016-09-18 MED ORDER — MIRTAZAPINE 15 MG PO TABS
15.0000 mg | ORAL_TABLET | Freq: Every day | ORAL | Status: DC
  Administered 2016-09-18 – 2016-09-19 (×2): 15 mg via ORAL
  Filled 2016-09-18 (×2): qty 1

## 2016-09-18 MED ORDER — ATORVASTATIN CALCIUM 10 MG PO TABS
10.0000 mg | ORAL_TABLET | Freq: Every day | ORAL | Status: DC
  Administered 2016-09-19: 10 mg via ORAL
  Filled 2016-09-18 (×2): qty 1

## 2016-09-18 NOTE — ED Notes (Signed)
Pt transported to MRI by Fleet Contrasachel, RN then will be transported to [facility] by Fleet Contrasachel as well. Melissa RN notified of this.

## 2016-09-18 NOTE — ED Notes (Signed)
Pharmacy called and notified of need of vanc and zosyn since it is not loaded in the pyxis. Pharmacy states they will send it.

## 2016-09-18 NOTE — H&P (Signed)
[facility] [place] at [facility]   PATIENT NAME: [name]    MR#:  [id number]  DATE OF BIRTH:  1950/06/29  DATE OF ADMISSION:  09/18/2016  PRIMARY CARE PHYSICIAN: Duanne Limerick, MD   REQUESTING/REFERRING PHYSICIAN: Governor Rooks M.D.  CHIEF COMPLAINT:   Chief Complaint  Patient presents with  . Wound Check    HISTORY OF PRESENT ILLNESS: [name]  is a 66 y.o. 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 Who has been followed at the wound care center for multiple pressure wounds. Patient went for follow-up today and was seen by Dr. Baltazar Najjar who recommend pt come to hospital to evaluate the wound. Patient has a new right buttock wound which was purulent and deep in the bright did at bedside at the wound care center. And was sent here. Patient has not had any fevers chills.      PAST MEDICAL HISTORY:   Past Medical History:  Diagnosis Date  . Allergy   . Anxiety   . CAD (coronary artery disease)   . Diabetes mellitus without complication (HCC)   . GERD (gastroesophageal reflux disease)   . Hyperlipidemia   . Quadriplegia (HCC)   . Seizures (HCC)   . Spinal cord disease (HCC)   . Stroke [facility])     PAST SURGICAL HISTORY: Past Surgical History:  Procedure Laterality Date  . ENDARTERECTOMY    . ENDARTERECTOMY    . TRACHEOSTOMY      SOCIAL HISTORY:  Social History  Substance Use Topics  . Smoking status: Never Smoker  . Smokeless tobacco: Never Used  . Alcohol use No    FAMILY HISTORY:  Family History  Problem Relation Age of Onset  . Stroke Father     DRUG ALLERGIES: No Known Allergies  REVIEW OF SYSTEMS:   CONSTITUTIONAL: No fever, fatigue or weakness.  EYES: No blurred or double vision.  EARS, NOSE, AND THROAT: No tinnitus or ear pain.  RESPIRATORY: No cough, shortness of breath, wheezing or hemoptysis.  CARDIOVASCULAR: No chest pain, orthopnea, edema.  GASTROINTESTINAL: No nausea,  vomiting, diarrhea or abdominal pain.  GENITOURINARY: No dysuria, hematuria.  ENDOCRINE: No polyuria, nocturia,  HEMATOLOGY: No anemia, easy bruising or bleeding SKIN: Positive decubitus ulcer  MUSCULOSKELETAL: No joint pain or arthritis.   NEUROLOGIC: No tingling, numbness, weakness.  PSYCHIATRY: No anxiety or depression.   MEDICATIONS AT HOME:  Prior to Admission medications   Medication Sig Start Date End Date Taking? Authorizing Provider  atorvastatin (LIPITOR) 10 MG tablet Take 10 mg by mouth daily.   Yes [provider]  baclofen (LIORESAL) 20 MG tablet Take 40 mg by mouth at bedtime.   Yes [provider]  citalopram (CELEXA) 40 MG tablet Take 1 tablet (40 mg total) by mouth daily. 07/31/16  Yes Duanne Limerick, MD  clopidogrel (PLAVIX) 75 MG tablet Take 75 mg by mouth daily.   Yes [provider]  diazepam (VALIUM) 5 MG tablet Take 5-10 mg by mouth 3 (three) times daily as needed for muscle spasms. Take 5 mg in the morning, 5 mg at noon and 10 mg every night at bedtime as needed for spasms. 03/02/15  Yes [provider]  furosemide (LASIX) 20 MG tablet Take 1 tablet (20 mg total) by mouth daily. 05/15/16  Yes Duanne Limerick, MD  levETIRAcetam (KEPPRA) 750 MG tablet Take 750 mg by mouth 3 (three) times daily.   Yes [provider]  metFORMIN (GLUCOPHAGE) 500 MG tablet Take 500 mg by mouth 2 (two) times daily with a meal.   Yes [provider]  mirtazapine (REMERON) 15 MG tablet Take 15 mg by mouth at bedtime. Dr. Carlena Hurl   Yes [provider]  oxybutynin (DITROPAN) 5 MG tablet Take 5 mg by mouth 2 (two) times daily.   Yes [provider]  pantoprazole (PROTONIX) 40 MG tablet Take 1 tablet (40 mg total) by mouth daily. 05/15/16  Yes Duanne Limerick, MD      PHYSICAL EXAMINATION:   VITAL SIGNS: Blood pressure 105/61, pulse 90, temperature 98.4 F (36.9 C), temperature source Oral, resp. rate 17, height 5\' 10"  (1.778  m), weight 156 lb 4.9 oz (70.9 kg), SpO2 98 %.  GENERAL:  66 y.o.-year-old patient lying in the bed with no acute distress.  EYES: Pupils equal, round, reactive to light and accommodation. No scleral icterus. Extraocular muscles intact.  HEENT: Head atraumatic, normocephalic. Oropharynx and nasopharynx clear.  NECK:  Supple, no jugular venous distention. No thyroid enlargement, no tenderness.  LUNGS: Normal breath sounds bilaterally, no wheezing, rales,rhonchi or crepitation. No use of accessory muscles of respiration.  CARDIOVASCULAR: S1, S2 normal. No murmurs, rubs, or gallops.  ABDOMEN: Soft, nontender, nondistended. Bowel sounds present. No organomegaly or mass.  EXTREMITIES: No pedal edema, cyanosis, or clubbing.  NEUROLOGIC: Cranial nerves II through XII are intact. Muscle strength 5/5 in all extremities. Sensation intact. Gait not checked.  PSYCHIATRIC: The patient is alert and oriented x 3.  SKIN: The emergency room patient has a wound in his scapula that has a dressing, also a dressing on his left buttocks shows a decubital ulcer which is superficial. Right buttocks area shows a deep decubitus ulcer down to the muscle with mild erythema surrounding the skin     LABORATORY PANEL:   CBC  Recent Labs Lab 09/18/16 1737  WBC 12.8*  HGB 9.9*  HCT 30.1*  PLT 389  MCV 84.4  MCH 27.7  MCHC 32.8  RDW 17.5*  LYMPHSABS 2.5  MONOABS 1.3*  EOSABS 0.3  BASOSABS 0.1   ------------------------------------------------------------------------------------------------------------------  Chemistries   Recent Labs Lab 09/18/16 1737  NA 130*  K 4.3  CL 98*  CO2 24  GLUCOSE 83  BUN 13  CREATININE 0.89  CALCIUM 8.5*  AST 22  ALT 16*  ALKPHOS 109  BILITOT 0.6   ------------------------------------------------------------------------------------------------------------------ estimated creatinine clearance is 80.8 mL/min (by C-G formula based on SCr of 0.89  mg/dL). ------------------------------------------------------------------------------------------------------------------ No results for input(s): TSH, T4TOTAL, T3FREE, THYROIDAB in the last 72 hours.  Invalid input(s): FREET3   Coagulation profile No results for input(s): INR, PROTIME in the last 168 hours. ------------------------------------------------------------------------------------------------------------------- No results for input(s): DDIMER in the last 72 hours. -------------------------------------------------------------------------------------------------------------------  Cardiac Enzymes No results for input(s): CKMB, TROPONINI, MYOGLOBIN in the last 168 hours.  Invalid input(s): CK ------------------------------------------------------------------------------------------------------------------ Invalid input(s): POCBNP  ---------------------------------------------------------------------------------------------------------------  Urinalysis    Component Value Date/Time   COLORURINE AMBER (A) 06/09/2016 1350   APPEARANCEUR TURBID (A) 06/09/2016 1350   APPEARANCEUR Cloudy 08/11/2012 1517   LABSPEC 1.012 06/09/2016 1350   LABSPEC 1.009 08/11/2012 1517   PHURINE 7.0 06/09/2016 1350   GLUCOSEU NEGATIVE 06/09/2016 1350   GLUCOSEU Negative 08/11/2012 1517   HGBUR SMALL (A) 06/09/2016 1350   BILIRUBINUR NEGATIVE 06/09/2016 1350   BILIRUBINUR Negative 08/11/2012 1517   KETONESUR NEGATIVE 06/09/2016 1350   PROTEINUR 100 (A) 06/09/2016 1350   NITRITE POSITIVE (A) 06/09/2016 1350   LEUKOCYTESUR LARGE (A) 06/09/2016 1350  LEUKOCYTESUR 3+ 08/11/2012 1517     RADIOLOGY: No results found.  EKG: Orders placed or performed during the hospital encounter of 06/09/16  . EKG 12-Lead  . 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 Infected decubitus ulcer present on  admission We will treat with IV vancomycin and Zosyn Surgical consult Will obtain MRI of the pelvis Wound care consult  2. Diabetes type 2 Sliding scale insulin Continue metformin  3. Hyperlipidemia Lipitor  4. Previous history of CVA continue Plavix  5. History of upper extremity swelling and fluid retention Lasix was held by wife will restart  6. GERD continue Protonix  7. Miscellaneous Lovenox for DVT prophylaxis  All the records are reviewed and case discussed with ED provider. Management plans discussed with the patient, family and they are in agreement.  CODE STATUS: Code Status History    Date Active Date Inactive Code Status Order ID Comments User Context   06/09/2016  4:20 PM 06/10/2016  9:57 PM Full Code [id number]  Adrian SaranMody, Sital, MD Inpatient   05/13/2015  4:12 AM 05/13/2015  7:29 PM Full Code [id number]  Oralia ManisWillis, David, MD Inpatient       TOTAL TIME TAKING CARE OF THIS PATIENT: 55 minutes.    Auburn BilberryPATEL, Kenichi Cassada M.D on 09/18/2016 at 6:56 PM  Between 7am to 6pm - Pager - [phone]  After 6pm go to [email or web address] - password [email or web address] [facility]  [facility]  Office  [phone]  CC: Primary care physician; Duanne LimerickJones, Deanna C, MD

## 2016-09-18 NOTE — ED Triage Notes (Signed)
Patient presents to ED via [facility] from wound center for evaluation of wound of right hip/buttock. Patient c/o "pain where all my wounds are". SCI, bed ridden.

## 2016-09-18 NOTE — Progress Notes (Addendum)
Pharmacy Antibiotic Note  [name] is a 66 y.o. male admitted on 09/18/2016 with wound infection.  Pharmacy has been consulted for vancomycin and piperacillin/tazobactam dosing.  Plan: Piperacillin/tazobactam 3.375 g IV q8h EI  Vancomycin 1000 mg IV given in ED.  Will order vancomycin 1000 mg IV q12h (6 hour stack) Goal VT 15-20 mcg/mL VT ordered for 7/26 @ 1230  Kinetics: Using actual body weight = 71 kg, CrCl 80 mL/min Ke: 0.070 Half-life: 9 hours Vd: 49 L Cmin (estimated): ~17 mcg/mL  Height: 5\' 10"  (177.8 cm) Weight: 156 lb 4.9 oz (70.9 kg) IBW/kg (Calculated) : 73  Temp (24hrs), Avg:98.4 F (36.9 C), Min:98.4 F (36.9 C), Max:98.4 F (36.9 C)   Recent Labs Lab 09/18/16 1737  WBC 12.8*  CREATININE 0.89    Estimated Creatinine Clearance: 80.8 mL/min (by C-G formula based on SCr of 0.89 mg/dL).    No Known Allergies  Antimicrobials this admission: vancomycin 7/24 >>  Piperacillin/tazobactam 7/24 >>   Dose adjustments this admission: 7/24 PM vanc level 21, ~ 2 hours after first dose given. Will continue with plan as above.  Microbiology results: 7/24 BCx: Sent  Thank you for allowing pharmacy to be a part of this patient's care.  Cindi CarbonMary M Swayne, PharmD, BCPS Clinical Pharmacist 09/18/2016 6:57 PM

## 2016-09-18 NOTE — ED Provider Notes (Signed)
[facility] Emergency Department Provider Note ____________________________________________   I have reviewed the triage vital signs and the triage nursing note.  HISTORY  Chief Complaint Wound Check   Historian Limited history from patient, a story History obtained by phone call with wound care physician referring the patient to the ED, Dr. Baltazar NajjarMichael [name] Wife.  HPI [name] is a 66 y.o. 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  Patient is bedbound. [facility]. [facility] and was seen by Dr. Baltazar NajjarMichael [name].  There is a new right buttock wound which was purulent and deep and was debrided at the bedside down into the muscle. There is some mild surrounding erythema and physician discussed with caregiver, wife regarding patient will need evaluation for osteomyelitis, and close management for possible infection, and apparently the wife was pretty overwhelmed and he decided to work for the patient for ED evaluation.  Patient does not have sensation back there. He denies fevers. He denies nausea and vomiting.    Past Medical History:  Diagnosis Date  . Allergy   . Anxiety   . CAD (coronary artery disease)   . Diabetes mellitus without complication (HCC)   . GERD (gastroesophageal reflux disease)   . Hyperlipidemia   . Quadriplegia (HCC)   . Seizures (HCC)   . Spinal cord disease (HCC)   . Stroke [facility](HCC)     Patient Active Problem List   Diagnosis Date Noted  . Confusion 06/09/2016  . Carotid stenosis 03/20/2016  . GERD (gastroesophageal reflux disease) 05/13/2015  . CAD (coronary artery disease) 05/13/2015  . Anxiety 05/13/2015  . Quadriplegia (HCC) 05/13/2015  . UTI (lower urinary tract infection) 05/13/2015  . Adjustment disorder with depressed mood 02/23/2015  . Insomnia 02/23/2015  . Screening for diabetes mellitus  08/31/2014  . 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 Hemiplegia (HCC) 10/01/2012  . HLD (hyperlipidemia) 10/01/2012  . Seizure (HCC) 10/01/2012  . Concussion and swelling of spinal cord 10/01/2012    Past Surgical History:  Procedure Laterality Date  . ENDARTERECTOMY    . ENDARTERECTOMY    . TRACHEOSTOMY      Prior to Admission medications   Medication Sig Start Date End Date Taking? Authorizing Provider  atorvastatin (LIPITOR) 10 MG tablet TAKE 1 TABLET BY MOUTH  EVERY DAY 06/26/16   Duanne LimerickJones, Deanna C, MD  baclofen (LIORESAL) 20 MG tablet Take 1 tablet by mouth 2 (two) times daily. 04/26/16   [provider]  benzonatate (TESSALON) 100 MG capsule Take 1 capsule (100 mg total) by mouth 2 (two) times daily as needed for cough. 07/24/16   Duanne LimerickJones, Deanna C, MD  citalopram (CELEXA) 40 MG tablet Take 1 tablet (40 mg total) by mouth daily. 07/31/16   Duanne LimerickJones, Deanna C, MD  clopidogrel (PLAVIX) 75 MG tablet TAKE 1 TABLET BY MOUTH  EVERY DAY 06/26/16   Duanne LimerickJones, Deanna C, MD  diazepam (VALIUM) 5 MG tablet Take 5-10 mg by mouth 3 (three) times daily as needed for muscle spasms. Take 5 mg in the morning, 5 mg at noon and 10 mg every night at bedtime as needed for spasms. 03/02/15   [provider]  furosemide (LASIX) 20 MG tablet Take 1 tablet (20 mg total) by mouth daily. 05/15/16   Duanne LimerickJones, Deanna C, MD  levETIRAcetam (KEPPRA) 500 MG tablet Take 1 tablet (500 mg total) by mouth  2 (two) times daily. Patient taking differently: Take 500 mg by mouth 3 (three) times daily.  05/18/15   Duanne Limerick, MD  metFORMIN (GLUCOPHAGE) 500 MG tablet TAKE 1 TABLET BY MOUTH TWO  TIMES DAILY 06/26/16   Duanne Limerick, MD  mirtazapine (REMERON) 15 MG tablet Take 1 tablet by mouth daily. Dr. Carlena Hurl 04/16/16   [provider]  oxybutynin (DITROPAN) 5 MG tablet Take 1 tablet by mouth 3  times daily Patient taking differently: Take 1 tablet by mouth 3  times daily Urology Doctor- [facility] 06/08/15   Duanne Limerick, MD  pantoprazole (PROTONIX) 40 MG tablet Take 1 tablet (40 mg total) by mouth daily. 05/15/16   Duanne Limerick, MD  sodium chloride irrigation 0.9 % irrigation Irrigate with 1 application as directed at bedtime. Use to flush tube once daily at night. 05/20/16   [provider]    No Known Allergies  Family History  Problem Relation Age of Onset  . Stroke Father     Social History Social History  Substance Use Topics  . Smoking status: Never Smoker  . Smokeless tobacco: Never Used  . Alcohol use No    Review of Systems  Constitutional: Negative for fever. Eyes: Negative for visual changes. ENT: Negative for sore throat. Cardiovascular: Negative for chest pain. Respiratory: Negative for shortness of breath. Gastrointestinal: Negative for abdominal pain, vomiting and diarrhea. Genitourinary: Negative for dysuria. Musculoskeletal: Negative for back pain. Skin: Multiple pressure wounds to the back. Neurological: Negative for headache.  ____________________________________________   PHYSICAL EXAM:  VITAL SIGNS: ED Triage Vitals  Enc Vitals Group     BP 09/18/16 1727 133/80     Pulse Rate 09/18/16 1727 84     Resp 09/18/16 1727 16     Temp 09/18/16 1727 98.4 F (36.9 C)     Temp Source 09/18/16 1727 Oral     SpO2 09/18/16 1727 96 %     Weight 09/18/16 1728 156 lb 4.9 oz (70.9 kg)     Height 09/18/16 1728 5\' 10"  (1.778 m)     Head Circumference --      Peak Flow --      Pain Score 09/18/16 1726 5     Pain Loc --      Pain Edu? --      Excl. in GC? --      Constitutional: Alert and Cooperative, poor historian. Well appearing and in no distress. HEENT   Head: Normocephalic and atraumatic.      Eyes: Conjunctivae are normal. Pupils equal and round.       Ears:         Nose: No congestion/rhinnorhea.   Mouth/Throat: Mucous membranes are moist.   Neck: No stridor. Cardiovascular/Chest: Normal rate, regular rhythm.  No murmurs, rubs, or  gallops. Respiratory: Normal respiratory effort without tachypnea nor retractions. Breath sounds are clear and equal bilaterally. No wheezes/rales/rhonchi. Gastrointestinal: Soft. No distention, no guarding, no rebound. Nontender.    Genitourinary/rectal:Deferred Musculoskeletal: On his back he has a intact dressing to a wound near his scapula. He has a wound dressing to his left buttock which upon taking down and shows a decubitus ulcer that is fairly superficial. Wound dressing pulled down to the right buttock area which is approximate quarter-sized deep decubitus ulcer down to the muscle with mild erythema surrounding the skin. It's been recently debrided and is down to pink tissue. No active purulence or drainage appreciated.  Neurologic:  Normal speech and  language. He does have some movement of both legs although there is a fair amount of spasticity. Skin:  Skin is warm, dry and intact. No rash noted. Psychiatric: No agitation.   ____________________________________________  LABS (pertinent positives/negatives)  Labs Reviewed  COMPREHENSIVE METABOLIC PANEL - Abnormal; Notable for the following:       Result Value   Sodium 130 (*)    Chloride 98 (*)    Calcium 8.5 (*)    Albumin 2.8 (*)    ALT 16 (*)    All other components within normal limits  CBC WITH DIFFERENTIAL/PLATELET - Abnormal; Notable for the following:    WBC 12.8 (*)    RBC 3.57 (*)    Hemoglobin 9.9 (*)    HCT 30.1 (*)    RDW 17.5 (*)    Neutro Abs 8.7 (*)    Monocytes Absolute 1.3 (*)    All other components within normal limits  CULTURE, BLOOD (ROUTINE X 2)  CULTURE, BLOOD (ROUTINE X 2)  SEDIMENTATION RATE    ____________________________________________    EKG I, Governor Rooks, MD, the attending physician have personally viewed and interpreted all ECGs.  None ____________________________________________  RADIOLOGY All Xrays were viewed by me. Imaging interpreted by Radiologist.  MRI pelvis:  Pending __________________________________________  PROCEDURES  Procedure(s) performed: None  Critical Care performed: None  ____________________________________________   ED COURSE / ASSESSMENT AND PLAN  Pertinent labs & imaging results that were available during my care of the patient were reviewed by me and considered in my medical decision making (see chart for details).  Patient was sent in with a note from the treating wound physician stating to deep decubitus ulcer that had been bedside appreciated down into muscle, and a culture had been sent. I spoke with Dr. Andrey Campanile by phone who indicated that when he was speaking about the next step in evaluation with the caregiver, the patient's wife, she became quite overwhelmed, and he felt like the patient needed closer follow-up, ED evaluation for possible osteomyelitis and evaluation for whether or not the patient would need hospitalization and antibiotics, potentially nursing home placement given the social situation.   He does not seem to be having symptoms of sepsis.  When I look at the wound in question, it actually looks like it had excellent bedside debridement with pink tissue. There is some erythema surrounding the ulcer.  The biggest question is given the depth of this ulcer, could there be osteomyelitis.  I will order MRI for diagnostic evaluation of possibility for osteomyelitis.  Laboratory studies will be obtained.  White blood cell count came back elevated at 12.8. This is up from prior. I'm concerned that given the new wound plus the erythema about all right buttock cellulitis surrounding that decubitus wound. I am going to go ahead and cover with IV antibiotics and admitted to the hospital overnight observation. He will have his MRI.      CONSULTATIONS:   Hospitalist for admission.   Patient / Family / Caregiver informed of clinical course, medical decision-making process, and agree with  plan.  ___________________________________________   FINAL CLINICAL IMPRESSION(S) / ED DIAGNOSES   Final diagnoses:  Decubitus skin ulcer  Decubitus ulcer of right buttock, unspecified ulcer stage  Cellulitis of right buttock              Note: This dictation was prepared with Dragon dictation. Any transcriptional errors that result from this process are unintentional    Governor Rooks, MD 09/18/16 1825

## 2016-09-18 NOTE — ED Notes (Signed)
Patient turned on to left side at this time. Family at bedside.

## 2016-09-18 NOTE — ED Notes (Signed)
Pt eating at this time, admitting doctor stated to pt he could eat.

## 2016-09-18 NOTE — ED Notes (Signed)
Patient transported to MRI 

## 2016-09-19 LAB — CBC
HCT: 27 % — ABNORMAL LOW (ref 40.0–52.0)
Hemoglobin: 9 g/dL — ABNORMAL LOW (ref 13.0–18.0)
MCH: 27.6 pg (ref 26.0–34.0)
MCHC: 33.4 g/dL (ref 32.0–36.0)
MCV: 82.8 fL (ref 80.0–100.0)
PLATELETS: 371 10*3/uL (ref 150–440)
RBC: 3.26 MIL/uL — AB (ref 4.40–5.90)
RDW: 17 % — ABNORMAL HIGH (ref 11.5–14.5)
WBC: 9.6 10*3/uL (ref 3.8–10.6)

## 2016-09-19 LAB — GLUCOSE, CAPILLARY
GLUCOSE-CAPILLARY: 129 mg/dL — AB (ref 65–99)
GLUCOSE-CAPILLARY: 128 mg/dL — AB (ref 65–99)
Glucose-Capillary: 96 mg/dL (ref 65–99)
Glucose-Capillary: 115 mg/dL — ABNORMAL HIGH (ref 65–99)

## 2016-09-19 LAB — BASIC METABOLIC PANEL
Anion gap: 6 (ref 5–15)
BUN: 12 mg/dL (ref 6–20)
CHLORIDE: 103 mmol/L (ref 101–111)
CO2: 27 mmol/L (ref 22–32)
Calcium: 8.4 mg/dL — ABNORMAL LOW (ref 8.9–10.3)
Creatinine, Ser: 0.77 mg/dL (ref 0.61–1.24)
GFR calc Af Amer: >60 mL/min (ref >60)
GLUCOSE: 95 mg/dL (ref 65–99)
POTASSIUM: 3.7 mmol/L (ref 3.5–5.1)
Sodium: 136 mmol/L (ref 135–145)

## 2016-09-19 NOTE — Consult Note (Signed)
Patient ID: [name], male   DOB: 04-May-1950, 66 y.o.   MRN: [id number]  CC: Multiple decubitus ulcers  HPI [name] is a 66 y.o. male currently admitted to the medicine service for infected decubitus ulcers. Surgery consult requested for evaluation. Patient was sent to the emergency department yesterday by his wound care provider due to a worsening ulcer of his right buttocks. Patient is insensate to the area but was evaluated by the emergency department. 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 Patient denies any fevers, chills, chest pain, shortness of breath, nausea, vomiting, diarrhea, constipation. He is otherwise in his usual state of health.  HPI  Past Medical History:  Diagnosis Date  . Allergy   . Anxiety   . CAD (coronary artery disease)   . Diabetes mellitus without complication (HCC)   . GERD (gastroesophageal reflux disease)   . Hyperlipidemia   . Quadriplegia (HCC)   . Seizures (HCC)   . Spinal cord disease (HCC)   . Stroke [facility](HCC)     Past Surgical History:  Procedure Laterality Date  . ENDARTERECTOMY    . ENDARTERECTOMY    . TRACHEOSTOMY      Family History  Problem Relation Age of Onset  . Stroke Father     Social History Social History  Substance Use Topics  . Smoking status: Never Smoker  . Smokeless tobacco: Never Used  . Alcohol use No    No Known Allergies  Current Facility-Administered Medications  Medication Dose Route Frequency Provider Last Rate Last Dose  . 0.9 %  sodium chloride infusion  250 mL Intravenous PRN Auburn BilberryPatel, Shreyang, MD      . acetaminophen (TYLENOL) tablet 650 mg  650 mg Oral Q6H PRN Auburn BilberryPatel, Shreyang, MD       Or  . acetaminophen (TYLENOL) suppository 650 mg  650 mg Rectal Q6H PRN Auburn BilberryPatel, Shreyang, MD      . atorvastatin (LIPITOR) tablet 10 mg  10 mg Oral Daily Auburn BilberryPatel, Shreyang, MD      . baclofen (LIORESAL) tablet 40 mg  40 mg Oral QHS Auburn BilberryPatel, Shreyang, MD   40 mg at 09/18/16 2225  .  citalopram (CELEXA) tablet 40 mg  40 mg Oral Daily Auburn BilberryPatel, Shreyang, MD      . clopidogrel (PLAVIX) tablet 75 mg  75 mg Oral Daily Auburn BilberryPatel, Shreyang, MD      . diazepam (VALIUM) tablet 5-10 mg  5-10 mg Oral TID PRN Auburn BilberryPatel, Shreyang, MD      . enoxaparin (LOVENOX) injection 40 mg  40 mg Subcutaneous Q24H Auburn BilberryPatel, Shreyang, MD   40 mg at 09/18/16 2225  . furosemide (LASIX) tablet 20 mg  20 mg Oral Daily Auburn BilberryPatel, Shreyang, MD      . HYDROcodone-acetaminophen (NORCO/VICODIN) 5-325 MG per tablet 1-2 tablet  1-2 tablet Oral Q4H PRN Auburn BilberryPatel, Shreyang, MD      . insulin aspart (novoLOG) injection 0-9 Units  0-9 Units Subcutaneous TID WC Auburn BilberryPatel, Shreyang, MD      . levETIRAcetam (KEPPRA) tablet 750 mg  750 mg Oral TID Auburn BilberryPatel, Shreyang, MD   750 mg at 09/18/16 2225  . metFORMIN (GLUCOPHAGE) tablet 500 mg  500 mg Oral BID WC Auburn BilberryPatel, Shreyang, MD   500 mg at 09/19/16 0842  . mirtazapine (REMERON) tablet 15 mg  15 mg Oral QHS Auburn BilberryPatel, Shreyang, MD   15 mg at 09/18/16 2225  . ondansetron (ZOFRAN) tablet 4 mg  4 mg Oral Q6H PRN Auburn BilberryPatel, Shreyang, MD  Or  . ondansetron (ZOFRAN) injection 4 mg  4 mg Intravenous Q6H PRN Auburn BilberryPatel, Shreyang, MD      . oxybutynin (DITROPAN) tablet 5 mg  5 mg Oral BID Auburn BilberryPatel, Shreyang, MD   5 mg at 09/18/16 2225  . pantoprazole (PROTONIX) EC tablet 40 mg  40 mg Oral Daily Auburn BilberryPatel, Shreyang, MD      . piperacillin-tazobactam (ZOSYN) IVPB 3.375 g  3.375 g Intravenous Q8H Cindi CarbonSwayne, Mary M, RPH 12.5 mL/hr at 09/19/16 0624 3.375 g at 09/19/16 [id number]  . pneumococcal 23 valent vaccine (PNU-IMMUNE) injection 0.5 mL  0.5 mL Intramuscular Tomorrow-1000 Auburn BilberryPatel, Shreyang, MD      . sodium chloride flush (NS) 0.9 % injection 3 mL  3 mL Intravenous Q12H Auburn BilberryPatel, Shreyang, MD   3 mL at 09/18/16 2228  . sodium chloride flush (NS) 0.9 % injection 3 mL  3 mL Intravenous PRN Auburn BilberryPatel, Shreyang, MD      . vancomycin (VANCOCIN) IVPB 1000 mg/200 mL premix  1,000 mg Intravenous Q12H Cindi CarbonSwayne, Mary M, [facility]   Stopped at 09/19/16 [id number]      Review of Systems A Multi-point review of systems was asked and was negative except for the findings documented in the history of present illness  Physical Exam Blood pressure (!) 100/45, pulse 78, temperature 97.7 F (36.5 C), temperature source Oral, resp. rate 17, height 5\' 10"  (1.778 m), weight 70.9 kg (156 lb 4.9 oz), SpO2 99 %. CONSTITUTIONAL: No acute distress. EYES: Pupils are equal, round, and reactive to light, Sclera are non-icteric. EARS, NOSE, MOUTH AND THROAT: The oropharynx is clear. The oral mucosa is pink and moist. Hearing is intact to voice. LYMPH NODES:  Lymph nodes in the neck are normal. RESPIRATORY:  Lungs are clear.  CARDIOVASCULAR: Heart is regular  GI: The abdomen is  soft, nontender, and nondistended.  GU: Rectal deferred.   MUSCULOSKELETAL: No cyanosis or edema.   SKIN: Multiple decubitus ulcers. Right shoulder and left buttocks with a very superficial decubitus ulcers without spreading erythema or purulence. Right buttocks decubitus ulcer to the level of the muscle but without obvious purulent drainage. Viable skin to all areas. NEUROLOGIC: Cranial nerves are grossly intact. PSYCH:  Oriented to person, place and time. Affect is normal.  Data Reviewed Images and labs reviewed. Labs show normal white blood cell count 9.6 and a mild anemia with a current H&H 9.0 27.0. MRI of the pelvis shows the decubitus ulcer over the right initial tuberosity without osteomyelitis. There is evidence of cellulitis but no obvious drainable fluid collection to suggest an abscess. Most likely myositis. I have personally reviewed the patient's imaging, laboratory findings and medical records.    Assessment    Multiple decubitus ulcers    Plan    66 year old male with multiple decubitus ulcer secondary to his quadriplegia. No obvious undrained fluid collections or necrotic material seen on exam today. Recommend wound care consult for continued wound care. He will need  pressure offloading and frequent turning. He will need social support for assistance with what is likely to be a long-term problem. No current indications for any urgent surgical intervention at this time. Surgery will continue to follow with you.     Time spent with the patient was 55 minutes, with more than 50% of the time spent in face-to-face education, counseling and care coordination.     Ricarda Frameharles Natalia Wittmeyer, MD FACS General Surgeon 09/19/2016, 9:39 AM

## 2016-09-19 NOTE — Progress Notes (Signed)
AMINE, ADELSON ([id number]) Visit Report for 09/18/2016 Debridement Details Patient Name: [name], [name]. Date of Service: 09/18/2016 3:45 PM Medical Record Number: [id number] Patient Account Number: [id number] Date of Birth/Sex: 12-02-1950 (66 y.o. Male) Treating RN: Clover Mealy, RN, BSN, [place] Sink Primary Care Provider: Elizabeth Sauer Other Clinician: Referring Provider: Elizabeth Sauer Treating Provider/Extender: Altamese [place] in Treatment: 34 Debridement Performed for Wound #4 Left Gluteus Assessment: Performed By: Physician Maxwell Caul, MD Debridement: Debridement Pre-procedure Verification/Time Out Yes - 16:34 Taken: Start Time: 16:34 Pain Control: Lidocaine 4% Topical Solution Level: Skin/Subcutaneous Tissue Total Area Debrided (L x 1.3 (cm) x 1.2 (cm) = 1.56 (cm) W): Tissue and other Non-Viable, Eschar, Exudate, Fat, Fibrin/Slough, Subcutaneous material debrided: Instrument: Curette Specimen: Swab Number of Specimens 1 Taken: Bleeding: Moderate Hemostasis Achieved: Pressure End Time: 16:36 Procedural Pain: 4 Post Procedural Pain: 3 Response to Treatment: Procedure was tolerated well Post Debridement Measurements of Total Wound Length: (cm) 1.3 Stage: Category/Stage II Width: (cm) 1.2 Depth: (cm) 0.2 Volume: (cm) 0.245 Character of Wound/Ulcer Post Requires Further Debridement: Debridement Post Procedure Diagnosis Same as Pre-procedure DEWAUN, KINZLER ([id number]) Electronic Signature(s) Signed: 09/18/2016 5:23:04 PM By: Baltazar Najjar MD Signed: 09/18/2016 5:27:52 PM By: Elpidio Eric BSN, RN Entered By: Baltazar Najjar on 09/18/2016 16:56:56 Duley, [name] ([id number]) -------------------------------------------------------------------------------- Debridement Details Patient Name: [name] Bras C. Date of Service: 09/18/2016 3:45 PM Medical Record Number: [id number] Patient Account Number: [id number] Date of Birth/Sex: 10-04-1950 (66 y.o.  Male) Treating RN: Clover Mealy, RN, BSN, [place] Sink Primary Care Provider: Elizabeth Sauer Other Clinician: Referring Provider: Elizabeth Sauer Treating Provider/Extender: Altamese [place] in Treatment: 34 Debridement Performed for Wound #5 Right Gluteus Assessment: Performed By: Physician Maxwell Caul, MD Debridement: Debridement Pre-procedure Verification/Time Out Yes - 16:25 Taken: Start Time: 16:25 Pain Control: Lidocaine 4% Topical Solution Level: Skin/Subcutaneous Tissue Total Area Debrided (L x 3 (cm) x 2.4 (cm) = 7.2 (cm) W): Tissue and other Non-Viable, Eschar, Exudate, Fat, Fibrin/Slough, Muscle, Subcutaneous material debrided: Instrument: Blade, Forceps Specimen: Swab Number of Specimens 1 Taken: Bleeding: Moderate Hemostasis Achieved: Pressure End Time: 16:31 Procedural Pain: 4 Post Procedural Pain: 3 Response to Treatment: Procedure was tolerated well Post Debridement Measurements of Total Wound Length: (cm) 5 Stage: Category/Stage III Width: (cm) 5 Depth: (cm) 2 Volume: (cm) 39.27 Character of Wound/Ulcer Post Requires Further Debridement: Debridement Post Procedure Diagnosis Same as Pre-procedure Electronic Signature(s) Signed: 09/18/2016 5:23:04 PM By: Baltazar Najjar MD Signed: 09/18/2016 5:27:52 PM By: Elpidio Eric BSN, RN [address], [place] ([id number]) Entered By: Baltazar Najjar on 09/18/2016 17:09:40 [place]ken, [name] ([id number]) -------------------------------------------------------------------------------- HPI Details Patient Name: [name], [name] Date of Service: 09/18/2016 3:45 PM Medical Record Number: [id number] Patient Account Number: [id number] Date of Birth/Sex: Jul 06, 1950 (66 y.o. 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 He has lower extremity sensation and can move his legs but has no/limited control over his arms. His wife accompanies him today and states that in the late spring or early summer of 2017 the patient became very depressed. He refused the refused to mobilize and he developed several pressure ulcers on his back. Most of these have healed however they have a recalcitrant area over the right scapula. They've  been using Santyl on this for at least the last month. In terms of depression the patient is doing better now on an antidepressant. He saw his primary physician on 01/12/16 at which time there was apparently green drainage coming out of this area [Dr. Deana Jones]. Dr. Yetta BarreJones works in the [facility]. He has completed this Septra. Otherwise [facility] notes that he has a history of seizures. He also had a stroke in 2008 he follows with neurology. He also has type 2 diabetes on Glucophage, hyperlipidemia and gastroesophageal reflux. He takes Plavix for stroke prevention and Keppra for seizure prophylaxis. 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 02/08/16; this is a patient with a pressure ulcer over the right scapula. His wife has been doing the dressing with Santyl and border foam change every second day. When he arrived here 2 weeks ago we did a fairly extensive mechanical debridement. We are asked medical modalities to go out to the home and see what they might be eligible for in terms of pressure-relief surfaces [level 2] but the wife states that they have not heard from them. 03/20/15; this is a patient we haven't seen in 5-6 weeks. This was largely due to transportation issues. They've been using Santyl and border foam changing every second day for a  pressure injury over the right scapula. The patient has a C3-C4 spinal injury. We had also asked for a review by the people who supplied DME to look at his wheelchair cushion, mattress etc. I don't know that this ever happened. In the meantime the wound has done remarkably well current measurements 1.8 x 2 x 0.1 04/03/16; the patient's dimensions have gone up to 3 cm in diameter quite a deterioration from last time. He is also complaining of pain in this area which is new. 04/10/16; 3 x 1.5 x 0.1. No difference from last week. Culture I did last week was negative he has completed antibiotics. 04/24/16 2.4 x 2 x 0.1; wound generally looks smaller. Middle area that I had to debrided last time looks healthier. His son is fashioned a large piece of foam cut out where the patient's wound with hit the back of his wheelchair 05/01/16; wound is a same size however the surface of this looks better. The middle innkeeper area required a repeat debridement 05/15/16; wound is down and dimensions granulation still looks healthy. The medial aspect of this wound is now the deeper Divot. We'll see how this responds to further healing. We're using Hydrofera Blue 06/05/16; Wound 1.7x1.1x0.1 still using hyudrofera blue 06/26/16; the patient arrives back in clinic after a three-week hiatus. He was hospitalized from 06/09/16 through 06/10/16. He was found to be confused. CT scan of the head showed nothing really acute. His sodium was Neubauer, Theadore C. ([id number]) low at 131. He was rehydrated. He was felt to have a UTI and given antibiotics and antibiotics at discharge although his final culture result only showed multiple organisms. His wife says today that at the time of the hospitalization they discovered a large intact blister over the large aspect of his left calcaneus. This is recently ruptured. The wound on his right scapula is somewhat larger. More his wife is concerned about his current status. She states that he is  still confused sleeps for long periods. He is not having fever chills cough or diarrhea [1 loose bowel movement per day]. He continues  to have a suprapubic catheter. She notes that he is not eating and drinking well. Patient states he just does not want to eat. He does not feel nauseated or vomit. 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 Admission sodium was 131 at discharge 139 BUN 22 and 1.22 at admission, 17 and 1 at discharge. 07/03/16; patient's mental status is back to normal. He has a new wound on the right elbow caused by traumatizing his elbow against the wall apparently at the dermatologist office last week. We continue with the original wound on the right scapula and the wound from 2 weeks ago on his left heel. We have been using silver alginate to the area on the heel. 08/14/16; patient has not been here in almost 6 weeks. When he was here last time he had the pressure ulcer on the right scapula, atraumatic wound on the right lateral elbow and an area on his left heel. His wife states that at one point all of these were healed and she didn't really feel he needed to come back here. Since then the patient has developed a reopening of the area on the right scapula, a stage II wound on the left buttock and a reopening of the area on the right lateral elbow. As usual his wife as a litany of complaints against home health, she is dismissing or is going to dismiss well care. She tells [place] she has a long list of supplies at home already for some reason they were not felt to be eligible for a group 2 or 3 surface although they have a hospital bed at home but no offloading surface 08/28/16; the patient arrived today unfortunately incontinent of stool in spite of this the wounds all appear to be better including the right scapular area, his left buttock's left heel and the right lateral elbow. 09/18/16; this is a  patient who arrived today for follow-up of a pressure area over his right scapula area, left buttock and there was an area on his right lateral elbow. He arrived in clinic today with a worrisome area over the right initial tuberosity/right buttock. This had a necrotic surface with draining purulence. He has not been systemically unwell. The area over the left elbow healed. He arrived in clinic today with dressing that hadn't been changed over the scapula for 2 or 3 days per her intake. His wife is previously fired Designer, television/film set) Signed: 09/18/2016 5:23:04 PM By: Baltazar Najjar MD Entered By: Baltazar Najjar on 09/18/2016 17:01:38 [name], [name] ([id number]) -------------------------------------------------------------------------------- Physical Exam Details Patient Name: LAWERNCE, EARLL C. Date of Service: 09/18/2016 3:45 PM Medical Record Number: [id number] Patient Account Number: [id number] Date of Birth/Sex: 14-Oct-1950 (66 y.o. Male) Treating RN: Clover Mealy, RN, BSN, [place] Sink Primary Care Provider: Elizabeth Sauer Other Clinician: Referring Provider: Elizabeth Sauer Treating Provider/Extender: Maxwell Caul Weeks in Treatment: 34 Constitutional Sitting or standing Blood Pressure is within target range for patient.. Pulse regular and within target range for patient.Marland Kitchen Respirations regular, non-labored and within target range.. Temperature is normal and within the target range for the patient.Marland Kitchen appears in no distress. Notes Wound exam the new area over the right buttock/right initial tuberosity at a grossly necrotic surface with draining purulence. Culture of the purulent drainage obtained using pickups and a scalpel gross necrotic material removed down to the level of the muscle. There is surrounding erythema here which is tender indicative of soft  tissue infection. oThe area over the right scapula has a surface eschar. Wound is larger no debridement done oThe area over  the left buttock is a small wound with necrotic surface. Debrided with a #3 curet Electronic Signature(s) Signed: 09/18/2016 5:23:04 PM By: Baltazar Najjar MD Entered By: Baltazar Najjar on 09/18/2016 17:03:13 [name], [name] ([id number]) -------------------------------------------------------------------------------- Physician Orders Details Patient Name: KALUP, JAQUITH C. Date of Service: 09/18/2016 3:45 PM Medical Record Number: [id number] Patient Account Number: [id number] Date of Birth/Sex: March 30, 1950 (66 y.o. Male) Treating RN: Clover Mealy, RN, BSN, [place] Primary Care Provider: Elizabeth Sauer Other Clinician: Referring Provider: Elizabeth Sauer Treating Provider/Extender: Altamese [place] in Treatment: 75 Verbal / Phone Orders: No Diagnosis Coding Wound Cleansing Wound #1 Right,Proximal Back o Cleanse wound with mild soap and water Wound #4 Left Gluteus o Cleanse wound with mild soap and water Wound #5 Right Gluteus o Cleanse wound with mild soap and water Anesthetic Wound #1 Right,Proximal Back o Topical Lidocaine 4% cream applied to wound bed prior to debridement Wound #4 Left Gluteus o Topical Lidocaine 4% cream applied to wound bed prior to debridement Wound #5 Right Gluteus o Topical Lidocaine 4% cream applied to wound bed prior to debridement Skin Barriers/Peri-Wound Care Wound #1 Right,Proximal Back o Skin Prep Wound #4 Left Gluteus o Skin Prep Wound #5 Right Gluteus o Skin Prep Primary Wound Dressing Wound #1 Right,Proximal Back o Aquacel Ag Wound #4 Left Gluteus o Aquacel Ag Tomasetti, Davy C. ([id number]) Wound #5 Right Gluteus o Aquacel Ag Secondary Dressing Wound #1 Right,Proximal Back o Dry Gauze o Boardered Foam Dressing Wound #4 Left Gluteus o Dry Gauze o Boardered Foam Dressing Wound #5 Right Gluteus o Dry Gauze o Boardered Foam Dressing Dressing Change Frequency Wound #1 Right,Proximal Back o Change  dressing every day. Wound #4 Left Gluteus o Change dressing every day. Wound #5 Right Gluteus o Change dressing every day. Follow-up Appointments Wound #1 Right,Proximal Back o Return Appointment in 1 week. - or after discharge from the hospital Wound #4 Left Gluteus o Return Appointment in 1 week. - or after discharge from the hospital Wound #5 Right Gluteus o Return Appointment in 1 week. - or after discharge from the hospital Off-Loading Wound #1 Right,Proximal Back o Roho cushion for wheelchair o Turn and reposition every 2 hours Wound #4 Left Gluteus o Roho cushion for wheelchair o Turn and reposition every 2 hours Wound #5 Right Gluteus o Roho cushion for wheelchair [name], [name] ([id number]) o Turn and reposition every 2 hours Additional Orders / Instructions Wound #1 Right,Proximal Back o Increase protein intake. Wound #4 Left Gluteus o Increase protein intake. Wound #5 Right Gluteus o Increase protein intake. Laboratory o Bacteria identified in Wound by Culture (MICRO) - right gluteus oooo LOINC Code: 6462-6 oooo Convenience Name: Wound culture routine Electronic Signature(s) Signed: 09/18/2016 5:23:04 PM By: Baltazar Najjar MD Signed: 09/18/2016 5:27:52 PM By: Elpidio Eric BSN, RN Entered By: Elpidio Eric on 09/18/2016 16:41:50 [place], [name] ([id number]) -------------------------------------------------------------------------------- Problem List Details Patient Name: RICH, PAPROCKI C. Date of Service: 09/18/2016 3:45 PM Medical Record Number: [id number] Patient Account Number: [id number] Date of Birth/Sex: [birth year] (66 y.o. Male) Treating RN: Clover Mealy, RN, BSN, [place] Sink Primary Care Provider: Elizabeth Sauer Other Clinician: Referring Provider: Elizabeth Sauer Treating Provider/Extender: Maxwell Caul Weeks in Treatment: 56 Active Problems ICD-10 Encounter Code Description Active Date Diagnosis L89.893 Pressure ulcer of other  site, stage 3 01/24/2016 Yes S14.103S Unspecified injury at C3 level of cervical spinal cord, 01/24/2016 Yes  sequela S51.011A Laceration without foreign body of right elbow, initial 07/03/2016 Yes encounter L89.322 Pressure ulcer of left buttock, stage 2 08/14/2016 Yes L89.310 Pressure ulcer of right buttock, unstageable 09/18/2016 Yes L03.317 Cellulitis of buttock 09/18/2016 Yes Inactive Problems Resolved Problems ICD-10 Code Description Active Date Resolved Date L89.622 Pressure ulcer of left heel, stage 2 06/26/2016 06/26/2016 Electronic Signature(s) Signed: 09/18/2016 5:23:04 PM By: Baltazar Najjar MD Entered By: Baltazar Najjar on 09/18/2016 16:56:28 [place]al, [name] ([id number]) EON, ZUNKER ([id number]) -------------------------------------------------------------------------------- Progress Note Details Patient Name: [name] Bras C. Date of Service: 09/18/2016 3:45 PM Medical Record Number: [id number] Patient Account Number: [id number] Date of Birth/Sex: 22-May-1950 (66 y.o. 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 He has lower extremity sensation and can move his legs but has no/limited control over his arms. His wife accompanies him today and states that in the late spring or early summer of 2017 the patient became very depressed. He refused the refused to mobilize and he developed several pressure ulcers on his back. Most of these have healed however they have a recalcitrant area over the right scapula. They've been using Santyl on this for at least the last month. In terms of depression the patient is doing better now on an  antidepressant. He saw his primary physician on 01/12/16 at which time there was apparently green drainage coming out of this area [Dr. Deana Jones]. Dr. Yetta Barre works in the [facility]. He has completed this Septra. Otherwise looking [facility] notes that he has a history of seizures. He also had a stroke in 2008 he follows with neurology. He also has type 2 diabetes on Glucophage, hyperlipidemia and gastroesophageal reflux. He takes Plavix for stroke prevention and Keppra for seizure prophylaxis. 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 02/08/16; this is a patient with a pressure ulcer over the right scapula. His wife has been doing the dressing with Santyl and border foam change every second day. When he arrived here 2 weeks ago we did a fairly extensive mechanical debridement. We are asked medical modalities to go out to the home and see what they might be eligible for in terms of pressure-relief surfaces [level 2] but the wife states that they have not heard from them. 03/20/15; this is a patient we haven't seen in 5-6 weeks. This was largely due to transportation issues. They've been using Santyl and border foam changing every second day for a pressure injury over the right scapula. The patient has a C3-C4 spinal injury. We had also asked for a review by the people who supplied DME to look at his wheelchair cushion, mattress etc. I don't know that this ever happened. In the meantime the wound has done remarkably well current measurements 1.8 x 2 x 0.1 04/03/16; the patient's dimensions have gone up to 3 cm in diameter quite a deterioration from last time. He is also complaining of pain in this area which is new. 04/10/16; 3 x 1.5 x 0.1. No difference from last week. Culture I did last week was negative he has completed antibiotics. 04/24/16 2.4 x 2 x 0.1; wound  generally looks smaller.  Middle area that I had to debrided last time looks healthier. His son is fashioned a large piece of foam cut out where the patient's wound with hit the back of his wheelchair 05/01/16; wound is a same size however the surface of this looks better. The middle innkeeper area required a repeat debridement 05/15/16; wound is down and dimensions granulation still looks healthy. The medial aspect of this wound is now the deeper Divot. We'll see how this responds to further healing. We're using Hydrofera Blue 06/05/16; Wound 1.7x1.1x0.1 still using hyudrofera blue 06/26/16; the patient arrives back in clinic after a three-week hiatus. He was hospitalized from 06/09/16 through [facility] ([id number]) 06/10/16. He was found to be confused. CT scan of the head showed nothing really acute. His sodium was low at 131. He was rehydrated. He was felt to have a UTI and given antibiotics and antibiotics at discharge although his final culture result only showed multiple organisms. His wife says today that at the time of the hospitalization they discovered a large intact blister over the large aspect of his left calcaneus. This is recently ruptured. The wound on his right scapula is somewhat larger. More his wife is concerned about his current status. She states that he is still confused sleeps for long periods. He is not having fever chills cough or diarrhea [1 loose bowel movement per day]. He continues to have a suprapubic catheter. She notes that he is not eating and drinking well. Patient states he just does not want to eat. He does not feel nauseated or vomit. 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 Admission sodium was 131 at discharge 139 BUN 22 and 1.22 at admission, 17 and 1 at discharge. 07/03/16; patient's mental status is back to normal. He has a new wound on the right elbow caused by traumatizing his elbow against  the wall apparently at the dermatologist office last week. We continue with the original wound on the right scapula and the wound from 2 weeks ago on his left heel. We have been using silver alginate to the area on the heel. 08/14/16; patient has not been here in almost 6 weeks. When he was here last time he had the pressure ulcer on the right scapula, atraumatic wound on the right lateral elbow and an area on his left heel. His wife states that at one point all of these were healed and she didn't really feel he needed to come back here. Since then the patient has developed a reopening of the area on the right scapula, a stage II wound on the left buttock and a reopening of the area on the right lateral elbow. As usual his wife as a litany of complaints against home health, she is dismissing or is going to dismiss well care. She tells [place] she has a long list of supplies at home already for some reason they were not felt to be eligible for a group 2 or 3 surface although they have a hospital bed at home but no offloading surface 08/28/16; the patient arrived today unfortunately incontinent of stool in spite of this the wounds all appear to be better including the right scapular area, his left buttock's left heel and the right lateral elbow. 09/18/16; this is a patient who arrived today for follow-up of a pressure area over his right scapula area, left buttock and there was an area  on his right lateral elbow. He arrived in clinic today with a worrisome area over the right initial tuberosity/right buttock. This had a necrotic surface with draining purulence. He has not been systemically unwell. The area over the left elbow healed. He arrived in clinic today with dressing that hadn't been changed over the scapula for 2 or 3 days per her intake. His wife is previously fired home health Objective Constitutional Sitting or standing Blood Pressure is within target range for patient.. Pulse regular and within  target range for patient.Marland Kitchen Respirations regular, non-labored and within target range.. Temperature is normal and within the target range for the patient.Marland Kitchen appears in no distress. Vitals Time Taken: 3:58 PM, Height: 70 in, Weight: 187 lbs, BMI: 26.8, Temperature: 98.1 F, Pulse: 79 [name], [name] ([id number]) bpm, Respiratory Rate: 16 breaths/min, Blood Pressure: 123/57 mmHg. General Notes: Wound exam the new area over the right buttock/right initial tuberosity at a grossly necrotic surface with draining purulence. Culture of the purulent drainage obtained using pickups and a scalpel gross necrotic material removed down to the level of the muscle. There is surrounding erythema here which is tender indicative of soft tissue infection. The area over the right scapula has a surface eschar. Wound is larger no debridement done The area over the left buttock is a small wound with necrotic surface. Debrided with a #3 curet Integumentary (Hair, Skin) Wound #1 status is Open. Original cause of wound was Pressure Injury. The wound is located on the Right,Proximal Back. The wound measures 2.4cm length x 1.8cm width x 0.1cm depth; 3.393cm^2 area and 0.339cm^3 volume. Wound #3 status is Healed - Epithelialized. Original cause of wound was Trauma. The wound is located on the Right Elbow. The wound measures 0cm length x 0cm width x 0cm depth; 0cm^2 area and 0cm^3 volume. There is no tunneling or undermining noted. There is a none present amount of drainage noted. The wound margin is flat and intact. There is no granulation within the wound bed. There is no necrotic tissue within the wound bed. The periwound skin appearance exhibited: Dry/Scaly. The periwound skin appearance did not exhibit: Callus, Crepitus, Excoriation, Induration, Rash, Scarring, Maceration, Atrophie Blanche, Cyanosis, Ecchymosis, Hemosiderin Staining, Mottled, Pallor, Rubor, Erythema. Periwound temperature was noted as No  Abnormality. Wound #4 status is Open. Original cause of wound was Pressure Injury. The wound is located on the Left Gluteus. The wound measures 1.3cm length x 1.2cm width x 0.2cm depth; 1.225cm^2 area and 0.245cm^3 volume. Wound #5 status is Open. Original cause of wound was Pressure Injury. The wound is located on the Right Gluteus. The wound measures 3cm length x 2.4cm width x 0.1cm depth; 5.655cm^2 area and 0.565cm^3 volume. There is Fat Layer (Subcutaneous Tissue) Exposed exposed. There is no tunneling or undermining noted. There is a medium amount of serosanguineous drainage noted. The wound margin is flat and intact. There is no granulation within the wound bed. There is a large (67-100%) amount of necrotic tissue within the wound bed including Eschar and Adherent Slough. The periwound skin appearance did not exhibit: Callus, Crepitus, Excoriation, Induration, Rash, Scarring, Dry/Scaly, Maceration, Atrophie Blanche, Cyanosis, Ecchymosis, Hemosiderin Staining, Mottled, Pallor, Rubor, Erythema. Periwound temperature was noted as No Abnormality. 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 There was a Skin/Subcutaneous Tissue Debridement ([id number]) debridement with total area of 1.56 sq cm performed by Maxwell Caul, MD. with the following instrument(s): Curette to remove Non-Viable tissue/material including Exudate, Fat Layer (and Subcutaneous Tissue) Exposed, Fibrin/Slough, Eschar, and Subcutaneous after achieving pain  control using Lidocaine 4% Topical Solution. 1 Specimen was taken by a Swab and sent to the lab per facility protocol.A time out was conducted at 16:34, prior to the start of the procedure. A Moderate amount of bleeding was controlled with Pressure. The procedure was tolerated well with a pain level of 4 throughout and a pain level of 3 following the procedure. Post Debridement Measurements: 1.3cm length x 1.2cm width x 0.2cm depth; 0.245cm^3 volume. Post debridement Stage noted as Category/Stage II. Character of Wound/Ulcer Post Debridement requires further debridement. Post procedure Diagnosis Wound #4: Same as Pre-Procedure Wound #5 Pre-procedure diagnosis of Wound #5 is a Pressure Ulcer located on the Right Gluteus . There was a Skin/Subcutaneous Tissue Debridement ([id number]) debridement with total area of 7.2 sq cm performed by Maxwell Caul, MD. with the following instrument(s): Blade and Forceps to remove Non-Viable tissue/material including Exudate, Fat Layer (and Subcutaneous Tissue) Exposed, Fibrin/Slough, Eschar, and Subcutaneous after achieving pain control using Lidocaine 4% Topical Solution. 1 Specimen was taken by a Swab and sent to the lab per facility protocol.A time out was conducted at 16:25, prior to the start of the procedure. A Moderate amount of bleeding was controlled with Pressure. The procedure was tolerated well with a pain level of 4 throughout and a pain level of 3 following the procedure. Post Debridement Measurements: 5cm length x 5cm width x 2cm depth; 39.27cm^3 volume. Post debridement Stage noted as Category/Stage III. Character of Wound/Ulcer Post Debridement requires further debridement. Post procedure Diagnosis Wound #5: Same as Pre-Procedure Plan Wound Cleansing: Wound #1 Right,Proximal Back: Cleanse wound with mild soap and water [name], [name] ([id number]) Wound #4 Left Gluteus: Cleanse wound with mild soap and water Wound #5 Right  Gluteus: Cleanse wound with mild soap and water Anesthetic: Wound #1 Right,Proximal Back: Topical Lidocaine 4% cream applied to wound bed prior to debridement Wound #4 Left Gluteus: Topical Lidocaine 4% cream applied to wound bed prior to debridement Wound #5 Right Gluteus: Topical Lidocaine 4% cream applied to wound bed prior to debridement Skin Barriers/Peri-Wound Care: Wound #1 Right,Proximal Back: Skin Prep Wound #4 Left Gluteus: Skin Prep Wound #5 Right Gluteus: Skin Prep Primary Wound Dressing: Wound #1 Right,Proximal Back: Aquacel Ag Wound #4 Left Gluteus: Aquacel Ag Wound #5 Right Gluteus: Aquacel Ag Secondary Dressing: Wound #1 Right,Proximal Back: Dry Gauze Boardered Foam Dressing Wound #4 Left Gluteus: Dry Gauze Boardered Foam Dressing Wound #5 Right Gluteus: Dry Gauze Boardered Foam Dressing Dressing Change Frequency: Wound #1 Right,Proximal Back: Change dressing every day. Wound #4 Left Gluteus: Change dressing every day. Wound #5 Right Gluteus: Change dressing every day. Follow-up Appointments: Wound #1 Right,Proximal Back: Return Appointment in 1 week. - or after discharge from the hospital Wound #4 Left Gluteus: Return Appointment in 1 week. - or after discharge from the hospital Wound #5 Right Gluteus: Return Appointment in 1 week. - or after discharge from the hospital Off-Loading: Wound #1 Right,Proximal Back: Brockwell, Lena C. ([id number]) Roho cushion for wheelchair Turn and reposition every 2 hours Wound #4 Left Gluteus: Roho cushion for  wheelchair Turn and reposition every 2 hours Wound #5 Right Gluteus: Roho cushion for wheelchair Turn and reposition every 2 hours Additional Orders / Instructions: Wound #1 Right,Proximal Back: Increase protein intake. Wound #4 Left Gluteus: Increase protein intake. Wound #5 Right Gluteus: Increase protein intake. Laboratory ordered were: Wound culture routine - right gluteus #1 the patient  arrived in clinic with a new wound over the right buttock/right initial tuberosity grossly necrotic surface with draining purulence. This was aggressively debrided as noted down to muscle layer defining an infected stage III wound. There is considerable degree of surrounding erythema and tenderness which was marked. No subcutaneous crepitus. #2 after some discussion we elected to pursue hospitalization for IV antibiotics and imaging. I called the ER to report #3 we did obtain a specimen of grossly purulent drainage which I think would represent a accurate representation of the cause of this infection if positive. #4 the area over the right scapula apparently had not had a change dressing in 3 or 4 days #5 smaller wound over the left buttock which also had a necrotic surface. Debrided with a #3 curet #6 I think we are dealing with an exhausted caregiver. o Electronic Signature(s) Signed: 09/18/2016 5:23:04 PM By: Baltazar Najjarobson, Terren Haberle MD [name], Shelden C. ([id number]) Entered By: Baltazar Najjarobson, Oza Oberle on 09/18/2016 17:08:40 Trefry, [name] BienICHARD C. ([id number]) -------------------------------------------------------------------------------- SuperBill Details Patient Name: [name], Travian C. Date of Service: 09/18/2016 Medical Record Number: [id number] Patient Account Number: [id number] Date of Birth/Sex: [birth year] [age] y.o. Male) Treating RN: Clover MealyAfful, RN, BSN, Rita Primary Care Provider: Elizabeth SauerJones, Deanna Other Clinician: Referring Provider: Elizabeth SauerJones, Deanna Treating Provider/Extender: Maxwell CaulOBSON, Crystal Ellwood G Weeks in Treatment: 34 Diagnosis Coding ICD-10 Codes Code Description 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, stage 3 S14.103S Unspecified injury at C3 level of cervical spinal cord, sequela S51.011A Laceration without foreign body of right elbow, initial encounter L89.322 Pressure ulcer of left buttock, stage 2 L89.310 Pressure ulcer of right buttock, unstageable L03.317 Cellulitis of buttock Facility  Procedures CPT4 Code: [id number] Description: 99213 - WOUND CARE VISIT-LEV 3 EST PT Modifier: Quantity: 1 CPT4 Code: [id number] Description: 11042 - DEB SUBQ TISSUE 20 SQ CM/< ICD-10 Description Diagnosis L89.322 Pressure ulcer of left buttock, stage 2 Modifier: Quantity: 1 CPT4 Code: [id number] Description: 11043 - DEB MUSC/FASCIA 20 SQ CM/< ICD-10 Description Diagnosis L89.310 Pressure ulcer of right buttock, unstageable Modifier: Quantity: 1 Physician Procedures CPT4 Code: [id number] Description: 11042 - WC PHYS SUBQ TISS 20 SQ CM ICD-10 Description Diagnosis L89.322 Pressure ulcer of left buttock, stage 2 Modifier: Quantity: 1 CPT4 Code: [id number] Rampersaud, RICHAR Description: 11043 - WC PHYS DEBR MUSCLE/FASCIA 20 SQ CM ICD-10 Description Diagnosis L89.310 Pressure ulcer of right buttock, unstageable D C. ([id number]) Modifier: Quantity: 1 Electronic Signature(s) Signed: 09/18/2016 5:27:38 PM By: Elpidio EricAfful, Rita BSN, RN Signed: 09/19/2016 6:25:53 PM By: Baltazar Najjarobson, Philana Younis MD Previous Signature: 09/18/2016 5:23:04 PM Version By: Baltazar Najjarobson, Amarionna Arca MD Entered By: Elpidio EricAfful, Rita on 09/18/2016 17:27:38

## 2016-09-19 NOTE — Progress Notes (Signed)
THURMON, MIZELL ([id number]) Visit Report for 09/18/2016 Arrival Information Details Patient Name: [name], [name]. Date of Service: 09/18/2016 3:45 PM Medical Record Number: [id number] Patient Account Number: [id number] Date of Birth/Sex: 10-08-1950 (66 y.o. Male) Treating RN: Baruch Gouty, RN, BSN, Velva Harman Primary Care Nehal Witting: Otilio Miu Other Clinician: Referring Maguadalupe Lata: Otilio Miu Treating Dessire Grimes/Extender: Tito Dine in Treatment: 33 Visit Information History Since Last Visit All ordered tests and consults were completed: No Patient Arrived: Wheel Chair Added or deleted any medications: No Arrival Time: 15:50 Any new allergies or adverse reactions: No Accompanied By: wife Had a fall or experienced change in No activities of daily living that may affect Transfer Assistance: Harrel Lemon Lift risk of falls: Patient Identification Verified: Yes Signs or symptoms of abuse/neglect since last No Secondary Verification Process Yes visito Completed: Hospitalized since last visit: No Patient Requires Transmission-Based No Has Dressing in Place as Prescribed: Yes Precautions: Pain Present Now: No Patient Has Alerts: Yes Electronic Signature(s) Signed: 09/18/2016 5:27:52 PM By: Regan Lemming BSN, RN Entered By: Regan Lemming on 09/18/2016 15:58:39 Stetzer, Leslye Peer ([id number]) -------------------------------------------------------------------------------- Clinic Level of Care Assessment Details Patient Name: [name], [name] Date of Service: 09/18/2016 3:45 PM Medical Record Number: [id number] Patient Account Number: [id number] Date of Birth/Sex: 1950/06/10 (66 y.o. Male) Treating RN: Baruch Gouty, RN, BSN, [place] Primary Care Paitynn Mikus: Otilio Miu Other Clinician: Referring Winnie Umali: Otilio Miu Treating Shavonne Ambroise/Extender: Tito Dine in Treatment: 34 Clinic Level of Care Assessment Items TOOL 3 Quantity Score _0  - Use when EandM and Procedure is performed on  FOLLOW-UP visit 0 ASSESSMENTS - Nursing Assessment / Reassessment _1  - Reassessment of Co-morbidities (includes updates in patient status) 0 X - Reassessment of Adherence to Treatment Plan 1 5 ASSESSMENTS - Wound and Skin Assessment / Reassessment _2  - Points for Wound Assessment can only be taken for a new wound of unknown 0 or different etiology and a procedure is NOT performed to that wound X - Simple Wound Assessment / Reassessment - one wound 1 5 _3  - Complex Wound Assessment / Reassessment - multiple wounds 0 _4  - Dermatologic / Skin Assessment (not related to wound area) 0 ASSESSMENTS - Focused Assessment _5  - Circumferential Edema Measurements - multi extremities 0 _6  - Nutritional Assessment / Counseling / Intervention 0 _7  - Lower Extremity Assessment (monofilament, tuning fork, pulses) 0 _8  - Peripheral Arterial Disease Assessment (using hand held doppler) 0 ASSESSMENTS - Ostomy and/or Continence Assessment and Care _9  - Incontinence Assessment and Management 0 _10  - Ostomy Care Assessment and Management (repouching, etc.) 0 PROCESS - Coordination of Care _11  - Points for Discharge Coordination can only be taken for a new wound of 0 unknown or different etiology and a procedure is NOT performed to that wound _12  - Simple Patient / Family Education for ongoing care 0 X - Complex (extensive) Patient / Family Education for ongoing care 1 [address] [name] ([id number]) X - Staff obtains Consents, Records, Test Results / Process Orders 1 10 _13  - Staff telephones HHA, Nursing Homes / Clarify orders / etc 0 X - Routine Transfer to another Facility (non-emergent condition) 1 10 X - Routine Hospital Admission (non-emergent condition) 1 10 _14  - New Admissions / Biomedical engineer / Ordering NPWT, Apligraf, etc. 0 _15  - Emergency Hospital Admission (emergent condition) 0 _16  - Simple Discharge Coordination 0 _17  - Complex (extensive) Discharge Coordination 0 PROCESS - Special  Needs _18  - Pediatric / Minor Patient Management 0 _19  - Isolation Patient Management 0 _20  -  Hearing / Language / Visual special needs 0 _0  - Assessment of Community assistance (transportation, D/C planning, etc.) 0 _1  - Additional assistance / Altered mentation 0 _2  - Support Surface(s) Assessment (bed, cushion, seat, etc.) 0 INTERVENTIONS - Wound Cleansing / Measurement _3  - Points for Wound Cleaning / Measurement, Wound Dressing, Specimen 0 Collection and Specimen taken to lab can only be taken for a new wound of unknown or different etiology and a procedure is NOT performed to that wound _4  - Simple Wound Cleansing - one wound 0 X - Complex Wound Cleansing - multiple wounds 1 5 X - Wound Imaging (photographs - any number of wounds) 1 5 _5  - Wound Tracing (instead of photographs) 0 _6  - Simple Wound Measurement - one wound 0 X - Complex Wound Measurement - multiple wounds 1 5 INTERVENTIONS - Wound Dressings _7  - Small Wound Dressing one or multiple wounds 0 Aries, Ceferino C. ([id number]) X - Medium Wound Dressing one or multiple wounds 1 15 _8  - Large Wound Dressing one or multiple wounds 0 INTERVENTIONS - Miscellaneous _9  - External ear exam 0 X - Specimen Collection (cultures, biopsies, blood, body fluids, etc.) 1 5 _10  - Specimen(s) / Culture(s) sent or taken to Lab for analysis 0 X - Patient Transfer (multiple staff / Civil Service fast streamer / Similar devices) 1 10 _11  - Simple Staple / Suture removal (25 or less) 0 _12  - Complex Staple / Suture removal (26 or more) 0 _13  - Hypo / Hyperglycemic Management (close monitor of Blood Glucose) 0 _14  - Ankle / Brachial Index (ABI) - do not check if billed separately 0 X - Vital Signs 1 5 Has the patient been seen at the hospital within the last three years: Yes Total Score: 110 Level Of Care: New/Established - Level 3 Electronic Signature(s) Signed: 09/18/2016 5:27:52 PM By: Regan Lemming BSN, RN Entered By: Regan Lemming on 09/18/2016 17:26:41 Gikas,  Leslye Peer ([id number]) -------------------------------------------------------------------------------- Encounter Discharge Information Details Patient Name: [name] Date of Service: 09/18/2016 3:45 PM Medical Record Number: [id number] Patient Account Number: [id number] Date of Birth/Sex: 07-08-1950 (66 y.o. Male) Treating RN: Baruch Gouty, RN, BSN, Velva Harman Primary Care Audrick Lamoureaux: Otilio Miu Other Clinician: Referring Adaijah Endres: Otilio Miu Treating Janilah Hojnacki/Extender: Tito Dine in Treatment: 9 Encounter Discharge Information Items Discharge Pain Level: 0 Discharge Condition: Stable Ambulatory Status: [facility] Emergency Discharge Destination: Room Transportation: Ambulance Schedule Follow-up Appointment: No Medication Reconciliation completed and provided to Patient/Care No Yee Gangi: Provided on Clinical Summary of Care: 09/18/2016 Form Type Recipient Paper Patient [facility] Electronic Signature(s) Signed: 09/18/2016 5:27:52 PM By: Regan Lemming BSN, RN Previous Signature: 09/18/2016 4:56:54 PM Version By: Ruthine Dose Entered By: Regan Lemming on 09/18/2016 17:05:18 Seng, Leslye Peer ([id number]) -------------------------------------------------------------------------------- Lower Extremity Assessment Details Patient Name: Tilson, Xai C. Date of Service: 09/18/2016 3:45 PM Medical Record Number: [id number] Patient Account Number: [id number] Date of Birth/Sex: 12-11-1950 (66 y.o. Male) Treating RN: Baruch Gouty, RN, BSN, Velva Harman Primary Care Riely Baskett: Otilio Miu Other Clinician: Referring Sidney Silberman: Otilio Miu Treating Ryaan Vanwagoner/Extender: Ricard Dillon Weeks in Treatment: 34 Electronic Signature(s) Signed: 09/18/2016 5:27:52 PM By: Regan Lemming BSN, RN Entered By: Regan Lemming on 09/18/2016 16:14:00 [place], Leslye Peer ([id number]) -------------------------------------------------------------------------------- Multi Wound Chart Details Patient Name: Goldwater, Axl  C. Date of Service: 09/18/2016 3:45 PM Medical Record Number: [id number] Patient Account Number: [id number] Date of Birth/Sex: [birth year] (66 y.o. Male) Treating RN: Baruch Gouty, RN, BSN, Velva Harman Primary Care Malynn Lucy: Otilio Miu Other Clinician: Referring Kyngston Pickelsimer: Otilio Miu Treating Kalina Morabito/Extender: Ricard Dillon Weeks in Treatment:  34 Vital Signs Height(in): 70 Pulse(bpm): 79 Weight(lbs): 187 Blood Pressure 123/57 (mmHg): Body Mass Index(BMI): 27 Temperature(F): 98.1 Respiratory Rate 16 (breaths/min): Photos: [1:No Photos] [3:No Photos] [4:No Photos] Wound Location: [1:Right, Proximal Back] [3:Right Elbow] [4:Left Gluteus] Wounding Event: [1:Pressure Injury] [3:Trauma] [4:Pressure Injury] Primary Etiology: [1:Pressure Ulcer] [3:Trauma, Other] [4:Pressure Ulcer] Comorbid History: [1:N/A] [3:Coronary Artery Disease, Type II Diabetes, Quadriplegia, Seizure Disorder] [4:N/A] Date Acquired: [1:11/24/2015] [3:06/28/2016] [4:07/09/2016] Weeks of Treatment: [1:34] [3:11] [4:5] Wound Status: [1:Open] [3:Healed - Epithelialized] [4:Open] Measurements L x W x D 2.4x1.8x0.1 [3:0x0x0] [4:1.3x1.2x0.2] (cm) Area (cm) : [1:3.393] [3:0] [4:1.225] Volume (cm) : [1:0.339] [3:0] [4:0.245] % Reduction in Area: [1:57.90%] [3:100.00%] [4:-680.30%] % Reduction in Volume: 57.90% [3:100.00%] [4:-1431.20%] Classification: [1:Category/Stage II] [3:Partial Thickness] [4:Category/Stage II] Exudate Amount: [1:N/A] [3:None Present] [4:N/A] Exudate Type: [1:N/A] [3:N/A] [4:N/A] Exudate Color: [1:N/A] [3:N/A] [4:N/A] Wound Margin: [1:N/A] [3:Flat and Intact] [4:N/A] Granulation Amount: [1:N/A] [3:None Present (0%)] [4:N/A] Necrotic Amount: [1:N/A] [3:None Present (0%)] [4:N/A] Necrotic Tissue: [1:N/A] [3:N/A] [4:N/A] Epithelialization: [1:N/A] [3:Large (67-100%)] [4:N/A] Periwound Skin Texture: No Abnormalities Noted [3:Excoriation: No Induration: No Callus: No Crepitus: No] [4:No Abnormalities  Noted] Rash: No Scarring: No Periwound Skin No Abnormalities Noted Dry/Scaly: Yes No Abnormalities Noted Moisture: Maceration: No Periwound Skin Color: No Abnormalities Noted Atrophie Blanche: No No Abnormalities Noted Cyanosis: No Ecchymosis: No Erythema: No Hemosiderin Staining: No Mottled: No Pallor: No Rubor: No Temperature: N/A No Abnormality N/A Tenderness on No No No Palpation: Wound Preparation: N/A Ulcer Cleansing: N/A Rinsed/Irrigated with Saline Topical Anesthetic Applied: None Wound Number: 5 N/A N/A Photos: No Photos N/A N/A Wound Location: Right Gluteus N/A N/A Wounding Event: Pressure Injury N/A N/A Primary Etiology: Pressure Ulcer N/A N/A Comorbid History: Coronary Artery Disease, N/A N/A Type II Diabetes, Quadriplegia, Seizure Disorder Date Acquired: 09/10/2016 N/A N/A Weeks of Treatment: 0 N/A N/A Wound Status: Open N/A N/A Measurements L x W x D 3x2.4x0.1 N/A N/A (cm) Area (cm) : 5.655 N/A N/A Volume (cm) : 0.565 N/A N/A % Reduction in Area: N/A N/A N/A % Reduction in Volume: N/A N/A N/A Classification: Unstageable/Unclassified N/A N/A Exudate Amount: Medium N/A N/A Exudate Type: Serosanguineous N/A N/A Exudate Color: red, brown N/A N/A Wound Margin: Flat and Intact N/A N/A Granulation Amount: None Present (0%) N/A N/A Necrotic Amount: Large (67-100%) N/A N/A Necrotic Tissue: Eschar, Adherent Slough N/A N/A Exposed Structures: N/A N/A Soley, Estephan C. ([id number]) Fat Layer (Subcutaneous Tissue) Exposed: Yes Fascia: No Tendon: No Muscle: No Joint: No Bone: No Epithelialization: None N/A N/A Periwound Skin Texture: Excoriation: No N/A N/A Induration: No Callus: No Crepitus: No Rash: No Scarring: No Periwound Skin Maceration: No N/A N/A Moisture: Dry/Scaly: No Periwound Skin Color: Atrophie Blanche: No N/A N/A Cyanosis: No Ecchymosis: No Erythema: No Hemosiderin Staining: No Mottled: No Pallor: No Rubor: No Temperature:  No Abnormality N/A N/A Tenderness on No N/A N/A Palpation: Wound Preparation: Ulcer Cleansing: N/A N/A Rinsed/Irrigated with Saline Topical Anesthetic Applied: Other: lidocaine 4% Treatment Notes Electronic Signature(s) Signed: 09/18/2016 5:27:52 PM By: Regan Lemming BSN, RN Entered By: Regan Lemming on 09/18/2016 16:22:27 Macphee, Leslye Peer ([id number]) -------------------------------------------------------------------------------- Multi-Disciplinary Care Plan Details Patient Name: DORON, SHAKE C. Date of Service: 09/18/2016 3:45 PM Medical Record Number: [id number] Patient Account Number: [id number] Date of Birth/Sex: 08/11/1950 (66 y.o. Male) Treating RN: Baruch Gouty, RN, BSN, Velva Harman Primary Care Loyal Rudy: Otilio Miu Other Clinician: Referring Summer Mccolgan: Otilio Miu Treating Tevis Dunavan/Extender: Ricard Dillon Weeks in Treatment: 1 Active Inactive ` Nutrition Nursing Diagnoses: Imbalanced nutrition Impaired glucose control: actual or potential  Goals: Patient/caregiver agrees to and verbalizes understanding of need to use nutritional supplements and/or vitamins as prescribed Date Initiated: 01/24/2016 Target Resolution Date: 03/27/2016 Goal Status: Active Patient/caregiver will maintain therapeutic glucose control Date Initiated: 01/24/2016 Target Resolution Date: 03/27/2016 Goal Status: Active Interventions: Assess patient nutrition upon admission and as needed per policy Provide education on elevated blood sugars and impact on wound healing Notes: ` Orientation to the Wound Care Program Nursing Diagnoses: Knowledge deficit related to the wound healing center program Goals: Patient/caregiver will verbalize understanding of the [facility] Date Initiated: 01/24/2016 Target Resolution Date: 03/27/2016 Goal Status: Active Interventions: Provide education on orientation to the wound center Notes: DARRIEN, BELTER ([id number]) ` Pain, Acute or  Chronic Nursing Diagnoses: Pain, acute or chronic: actual or potential Potential alteration in comfort, pain Goals: Patient will verbalize adequate pain control and receive pain control interventions during procedures as needed Date Initiated: 01/24/2016 Target Resolution Date: 03/27/2016 Goal Status: Active Patient/caregiver will verbalize adequate pain control between visits Date Initiated: 01/24/2016 Target Resolution Date: 03/27/2016 Goal Status: Active Patient/caregiver will verbalize comfort level met Date Initiated: 01/24/2016 Target Resolution Date: 03/27/2016 Goal Status: Active Interventions: Assess comfort goal upon admission Complete pain assessment as per visit requirements Notes: ` Pressure Nursing Diagnoses: Knowledge deficit related to management of pressures ulcers Potential for impaired tissue integrity related to pressure, friction, moisture, and shear Goals: Patient will remain free from development of additional pressure ulcers Date Initiated: 01/24/2016 Target Resolution Date: 03/27/2016 Goal Status: Active Interventions: Assess: immobility, friction, shearing, incontinence upon admission and as needed Assess offloading mechanisms upon admission and as needed Assess potential for pressure ulcer upon admission and as needed Provide education on pressure ulcers Notes: DILLAN, CANDELA ([id number]) Wound/Skin Impairment Nursing Diagnoses: Impaired tissue integrity Goals: Ulcer/skin breakdown will have a volume reduction of 30% by week 4 Date Initiated: 01/24/2016 Target Resolution Date: 03/27/2016 Goal Status: Active Ulcer/skin breakdown will have a volume reduction of 50% by week 8 Date Initiated: 01/24/2016 Target Resolution Date: 03/27/2016 Goal Status: Active Ulcer/skin breakdown will have a volume reduction of 80% by week 12 Date Initiated: 01/24/2016 Target Resolution Date: 03/27/2016 Goal Status: Active Interventions: Assess  patient/caregiver ability to perform ulcer/skin care regimen upon admission and as needed Assess ulceration(s) every visit Notes: Electronic Signature(s) Signed: 09/18/2016 5:27:52 PM By: Regan Lemming BSN, RN Entered By: Regan Lemming on 09/18/2016 16:19:56 Kocher, Leslye Peer ([id number]) -------------------------------------------------------------------------------- Pain Assessment Details Patient Name: [name] Date of Service: 09/18/2016 3:45 PM Medical Record Number: [id number] Patient Account Number: [id number] Date of Birth/Sex: Jan 02, 1951 (66 y.o. Male) Treating RN: Baruch Gouty, RN, BSN, Velva Harman Primary Care Sanita Estrada: Otilio Miu Other Clinician: Referring Meshach Perry: Otilio Miu Treating Shady Bradish/Extender: Ricard Dillon Weeks in Treatment: 34 Active Problems Location of Pain Severity and Description of Pain Patient Has Paino No Site Locations With Dressing Change: No Pain Management and Medication Current Pain Management: Electronic Signature(s) Signed: 09/18/2016 5:27:52 PM By: Regan Lemming BSN, RN Entered By: Regan Lemming on 09/18/2016 15:58:47 Bechler, Leslye Peer ([id number]) -------------------------------------------------------------------------------- Patient/Caregiver Education Details Patient Name: [name]. Date of Service: 09/18/2016 3:45 PM Medical Record Number: [id number] Patient Account Number: [id number] Date of Birth/Gender: 1950/10/02 (66 y.o. Male) Treating RN: Baruch Gouty, RN, BSN, Velva Harman Primary Care Physician: Otilio Miu Other Clinician: Referring Physician: Otilio Miu Treating Physician/Extender: Tito Dine in Treatment: 32 Education Assessment Education Provided To: Patient and Caregiver Education Topics Provided Elevated Blood Sugar/ Impact on Healing: Methods: Explain/Verbal Responses: Reinforcements needed Pressure: Methods: Explain/Verbal  Responses: Reinforcements needed Welcome To [facility]: Methods:  Explain/Verbal Responses: Reinforcements needed Electronic Signature(s) Signed: 09/18/2016 5:27:52 PM By: Regan Lemming BSN, RN Entered By: Regan Lemming on 09/18/2016 17:05:35 Debois, Leslye Peer ([id number]) -------------------------------------------------------------------------------- Wound Assessment Details Patient Name: Freyre, Kieon C. Date of Service: 09/18/2016 3:45 PM Medical Record Number: [id number] Patient Account Number: [id number] Date of Birth/Sex: [birth year] (66 y.o. Male) Treating RN: Baruch Gouty, RN, BSN, [place] Primary Care Jermeka Schlotterbeck: Otilio Miu Other Clinician: Referring Margie Brink: Otilio Miu Treating Narcisa Ganesh/Extender: Ricard Dillon Weeks in Treatment: 34 Wound Status Wound Number: 1 Primary Etiology: Pressure Ulcer Wound Location: Right, Proximal Back Wound Status: Open Wounding Event: Pressure Injury Date Acquired: 11/24/2015 Weeks Of Treatment: 34 Clustered Wound: No Photos Photo Uploaded By: Gretta Cool, BSN, RN, CWS, Kim on 09/18/2016 17:19:11 Wound Measurements Length: (cm) 2.4 Width: (cm) 1.8 Depth: (cm) 0.1 Area: (cm) 3.393 Volume: (cm) 0.339 % Reduction in Area: 57.9% % Reduction in Volume: 57.9% Wound Description Classification: Category/Stage II Periwound Skin Texture Texture Color No Abnormalities Noted: No No Abnormalities Noted: No Moisture No Abnormalities Noted: No Treatment Notes Wound #1 (Right, Proximal Back) 1. Cleansed with: Douville, Requan C. ([id number]) Clean wound with Normal Saline 2. Anesthetic Topical Lidocaine 4% cream to wound bed prior to debridement 3. Peri-wound Care: Skin Prep 4. Dressing Applied: Aquacel Ag 5. Secondary Dressing Applied Bordered Foam Dressing Dry Gauze Electronic Signature(s) Signed: 09/18/2016 5:27:52 PM By: Regan Lemming BSN, RN Entered By: Regan Lemming on 09/18/2016 16:10:25 [place], Leslye Peer ([id number]) -------------------------------------------------------------------------------- Wound  Assessment Details Patient Name: Brunelle, Jermy C. Date of Service: 09/18/2016 3:45 PM Medical Record Number: [id number] Patient Account Number: [id number] Date of Birth/Sex: 01/19/1951 (66 y.o. Male) Treating RN: Baruch Gouty, RN, BSN, [place] Primary Care Philipp Callegari: Otilio Miu Other Clinician: Referring Cherrelle Plante: Otilio Miu Treating Mikell Kazlauskas/Extender: Ricard Dillon Weeks in Treatment: 34 Wound Status Wound Number: 3 Primary Trauma, Other Etiology: Wound Location: Right Elbow Wound Healed - Epithelialized Wounding Event: Trauma Status: Date Acquired: 06/28/2016 Comorbid Coronary Artery Disease, Type II Weeks Of Treatment: 11 History: Diabetes, Quadriplegia, Seizure Clustered Wound: No Disorder Photos Photo Uploaded By: Gretta Cool, BSN, RN, CWS, Kim on 09/18/2016 17:19:36 Wound Measurements Length: (cm) 0 % Reduction in Width: (cm) 0 % Reduction in Depth: (cm) 0 Epithelializati Area: (cm) 0 Tunneling: Volume: (cm) 0 Undermining: Area: 100% Volume: 100% on: Large (67-100%) No No Wound Description Classification: Partial Thickness Foul Odor Afte Wound Margin: Flat and Intact Slough/Fibrino Exudate Amount: None Present r Cleansing: No No Wound Bed Granulation Amount: None Present (0%) Exposed Structure Necrotic Amount: None Present (0%) Fascia Exposed: No Fat Layer (Subcutaneous Tissue) Exposed: No Tendon Exposed: No Muscle Exposed: No Joint Exposed: No Strausbaugh, Jaccob C. ([id number]) Bone Exposed: No Periwound Skin Texture Texture Color No Abnormalities Noted: No No Abnormalities Noted: No Callus: No Atrophie Blanche: No Crepitus: No Cyanosis: No Excoriation: No Ecchymosis: No Induration: No Erythema: No Rash: No Hemosiderin Staining: No Scarring: No Mottled: No Pallor: No Moisture Rubor: No No Abnormalities Noted: No Dry / Scaly: Yes Temperature / Pain Maceration: No Temperature: No Abnormality Wound Preparation Ulcer Cleansing: Rinsed/Irrigated  with Saline Topical Anesthetic Applied: None Electronic Signature(s) Signed: 09/18/2016 5:27:52 PM By: Regan Lemming BSN, RN Entered By: Regan Lemming on 09/18/2016 16:03:36 Arvin, Leslye Peer ([id number]) -------------------------------------------------------------------------------- Wound Assessment Details Patient Name: Koeppen, Aiman C. Date of Service: 09/18/2016 3:45 PM Medical Record Number: [id number] Patient Account Number: [id number] Date of Birth/Sex: May 21, 1950 (66 y.o. Male) Treating RN: Baruch Gouty, RN, BSN, [facility] Primary Care Laiken Nohr: Otilio Miu  Other Clinician: Referring Jayceon Troy: Otilio Miu Treating Jamont Mellin/Extender: Ricard Dillon Weeks in Treatment: 34 Wound Status Wound Number: 4 Primary Etiology: Pressure Ulcer Wound Location: Left Gluteus Wound Status: Open Wounding Event: Pressure Injury Date Acquired: 07/09/2016 Weeks Of Treatment: 5 Clustered Wound: No Photos Photo Uploaded By: Gretta Cool, BSN, RN, CWS, Kim on 09/18/2016 17:20:15 Wound Measurements Length: (cm) 1.3 Width: (cm) 1.2 Depth: (cm) 0.2 Area: (cm) 1.225 Volume: (cm) 0.245 % Reduction in Area: -680.3% % Reduction in Volume: -1431.2% Wound Description Classification: Category/Stage II Periwound Skin Texture Texture Color No Abnormalities Noted: No No Abnormalities Noted: No Moisture No Abnormalities Noted: No Treatment Notes Wound #4 (Left Gluteus) 1. Cleansed with: Noguchi, Azell C. ([id number]) Clean wound with Normal Saline 2. Anesthetic Topical Lidocaine 4% cream to wound bed prior to debridement 3. Peri-wound Care: Skin Prep 4. Dressing Applied: Aquacel Ag 5. Secondary Dressing Applied Bordered Foam Dressing Dry Gauze Electronic Signature(s) Signed: 09/18/2016 5:27:52 PM By: Regan Lemming BSN, RN Entered By: Regan Lemming on 09/18/2016 16:10:25 [place], Leslye Peer ([id number]) -------------------------------------------------------------------------------- Wound Assessment  Details Patient Name: Rochon, Flem C. Date of Service: 09/18/2016 3:45 PM Medical Record Number: [id number] Patient Account Number: [id number] Date of Birth/Sex: Apr 15, 1950 (66 y.o. Male) Treating RN: Baruch Gouty, RN, BSN, [place] Primary Care Sherlyn Ebbert: Otilio Miu Other Clinician: Referring Laksh Hinners: Otilio Miu Treating Geovani Tootle/Extender: Ricard Dillon Weeks in Treatment: 34 Wound Status Wound Number: 5 Primary Pressure Ulcer Etiology: Wound Location: Right Gluteus Wound Open Wounding Event: Pressure Injury Status: Date Acquired: 09/10/2016 Comorbid Coronary Artery Disease, Type II Weeks Of Treatment: 0 History: Diabetes, Quadriplegia, Seizure Clustered Wound: No Disorder Photos Photo Uploaded By: Gretta Cool, BSN, RN, CWS, Kim on 09/18/2016 17:21:17 Wound Measurements Length: (cm) 3 Width: (cm) 2.4 Depth: (cm) 0.1 Area: (cm) 5.655 Volume: (cm) 0.565 % Reduction in Area: % Reduction in Volume: Epithelialization: None Tunneling: No Undermining: No Wound Description Classification: Unstageable/Unclassified Wound Margin: Flat and Intact Exudate Amount: Medium Exudate Type: Serosanguineous Exudate Color: red, brown Foul Odor After Cleansing: No Slough/Fibrino Yes Wound Bed Granulation Amount: None Present (0%) Exposed Structure Necrotic Amount: Large (67-100%) Fascia Exposed: No Necrotic Quality: Eschar, Adherent Slough Fat Layer (Subcutaneous Tissue) Exposed: Yes Tendon Exposed: No Portela, Nat C. ([id number]) Muscle Exposed: No Joint Exposed: No Bone Exposed: No Periwound Skin Texture Texture Color No Abnormalities Noted: No No Abnormalities Noted: No Callus: No Atrophie Blanche: No Crepitus: No Cyanosis: No Excoriation: No Ecchymosis: No Induration: No Erythema: No Rash: No Hemosiderin Staining: No Scarring: No Mottled: No Pallor: No Moisture Rubor: No No Abnormalities Noted: No Dry / Scaly: No Temperature / Pain Maceration: No Temperature:  No Abnormality Wound Preparation Ulcer Cleansing: Rinsed/Irrigated with Saline Topical Anesthetic Applied: Other: lidocaine 4%, Treatment Notes Wound #5 (Right Gluteus) 1. Cleansed with: Clean wound with Normal Saline 2. Anesthetic Topical Lidocaine 4% cream to wound bed prior to debridement 3. Peri-wound Care: Skin Prep 4. Dressing Applied: Aquacel Ag 5. Secondary Dressing Applied Bordered Foam Dressing Dry Gauze Electronic Signature(s) Signed: 09/18/2016 5:27:52 PM By: Regan Lemming BSN, RN Entered By: Regan Lemming on 09/18/2016 16:13:48 Shelley, Leslye Peer ([id number]) -------------------------------------------------------------------------------- [place] Details Patient Name: [name] Date of Service: 09/18/2016 3:45 PM Medical Record Number: [id number] Patient Account Number: [id number] Date of Birth/Sex: March 24, 1950 (66 y.o. Male) Treating RN: Baruch Gouty, RN, BSN, Velva Harman Primary Care Anne Boltz: Otilio Miu Other Clinician: Referring Akeria Hedstrom: Otilio Miu Treating Cassiopeia Florentino/Extender: Ricard Dillon Weeks in Treatment: 34 Vital Signs Time Taken: 15:58 Temperature (F): 98.1 Height (in): 70 Pulse (bpm): 79 Weight (lbs):  187 Respiratory Rate (breaths/min): 16 Body Mass Index (BMI): 26.8 Blood Pressure (mmHg): 123/57 Reference Range: 80 - 120 mg / dl Electronic Signature(s) Signed: 09/18/2016 5:27:52 PM By: Regan Lemming BSN, RN Entered By: Regan Lemming on 09/18/2016 16:02:02

## 2016-09-19 NOTE — Consult Note (Addendum)
WOC Nurse wound consult note Reason for Consult:stage III, stage II unstageable Wound type:pressure Pressure Injury POA: Yes Measurement:Left ischium 1.5 cm circle x 0.1cm with 5cm circle of blanchable erythema surrounding wound. The wound bed is 100% grey, drainage per nursing. (Dressings had just been changed.) Right ischial wound debrided by surgery yesterday, (per wife) 2.5cm x 3cm x 0.4cm stage III with 40% pale pink wound bed in center and 60% dark grey tissue surrounding. Also has a large (8cm) area of blanchable erythema surrounding this wound.  Moderate drainage and odor according to nursing. Right scapula has 8cm blanchable reddened area surrounding a  4cm x 3cm x 0.1cm stage II with three wounds each 0.5cm circle x  0.1cm deep with 100% tan slough in bed, no drainage or odor noted.  Wound bed:see above Drainage (amount, consistency, odor) see above Periwound:see above Dressing procedure/placement/frequency: Surgical consult has already been completed on this patient. MRI complete, no osteomylitis found, questionable Myositis.  I have written orders according to dressing that surgeon applied this am with bedside RN. I have also ordered a low air loss mattress (confirmation # [id number]) for this quadriplegic patient.  Patient appears malnourished.  Wife states does not eat any protein due to inability to chew.  Educated about some protein foods that do not require chewing. Pt could benefit from nutritional consult, please order if you agree. Patient's wife states she will bring his Prevalon boots from home so I did not place order for these. We will not follow, but will remain available to this patient, to nursing, and the medical and/or surgical teams.  Please re-consult if we need to assist further.   Barnett HatterMelinda Alzina Golda, RN-C, WTA-C, [facility] Wound Treatment Associate

## 2016-09-19 NOTE — Progress Notes (Signed)
[facility] - [place] at [facility]   PATIENT NAME: [name]    MR#:  [id number]  DATE OF BIRTH:  [birth year]  SUBJECTIVE:  CHIEF COMPLAINT:   Chief Complaint  Patient presents with  . Wound Check   Pain buttocks Afebrile  REVIEW OF SYSTEMS:    Review of Systems  Constitutional: Positive for malaise/fatigue. Negative for chills and fever.  HENT: Negative for sore throat.   Eyes: Negative for blurred vision, double vision and pain.  Respiratory: Negative for cough, hemoptysis, shortness of breath and wheezing.   Cardiovascular: Negative for chest pain, palpitations, orthopnea and leg swelling.  Gastrointestinal: Negative for abdominal pain, constipation, diarrhea, heartburn, nausea and vomiting.  Genitourinary: Negative for dysuria and hematuria.  Musculoskeletal: Positive for back pain. Negative for joint pain.  Skin: Negative for rash.  Neurological: Positive for sensory change (chronic), focal weakness (chronic) and weakness. Negative for speech change and headaches.  Endo/Heme/Allergies: Does not bruise/bleed easily.  Psychiatric/Behavioral: Negative for depression. The patient is not nervous/anxious.    DRUG ALLERGIES:  No Known Allergies  VITALS:  Blood pressure (!) 129/41, pulse 87, temperature 98.3 F (36.8 C), temperature source Oral, resp. rate 20, height 5\' 10"  (1.778 m), weight 70.9 kg (156 lb 4.9 oz), SpO2 97 %.  PHYSICAL EXAMINATION:   Physical Exam  GENERAL:  66 y.o.-year-old patient lying in the bed with no acute distress.  EYES: Pupils equal, round, reactive to light and accommodation. No scleral icterus. Extraocular muscles intact.  HEENT: Head atraumatic, normocephalic. Oropharynx and nasopharynx clear.  NECK:  Supple, no jugular venous distention. No thyroid enlargement, no tenderness.  LUNGS: Normal breath sounds bilaterally, no wheezing, rales, rhonchi. No use of accessory muscles of respiration.  CARDIOVASCULAR: S1, S2 normal.  No murmurs, rubs, or gallops.  ABDOMEN: Soft, nontender, nondistended. Bowel sounds present. No organomegaly or mass.  EXTREMITIES: No cyanosis, clubbing or edema b/l.    PSYCHIATRIC: The patient is alert and oriented x 3.  SKIN: Superficial right shoulder and left buttock ulcers. Deep right buttock ulcer with surrounding erythema  LABORATORY PANEL:   CBC  Recent Labs Lab 09/19/16 0433  WBC 9.6  HGB 9.0*  HCT 27.0*  PLT 371   ------------------------------------------------------------------------------------------------------------------ Chemistries   Recent Labs Lab 09/18/16 1737 09/19/16 0433  NA 130* 136  K 4.3 3.7  CL 98* 103  CO2 24 27  GLUCOSE 83 95  BUN 13 12  CREATININE 0.89 0.77  CALCIUM 8.5* 8.4*  AST 22  --   ALT 16*  --   ALKPHOS 109  --   BILITOT 0.6  --    ------------------------------------------------------------------------------------------------------------------  Cardiac Enzymes No results for input(s): TROPONINI in the last 168 hours. ------------------------------------------------------------------------------------------------------------------  RADIOLOGY:  Mr Pelvis Wo Contrast  Result Date: 09/19/2016 CLINICAL DATA:  Jaclyn ShaggyRichard C [name] is a 66 y.o. 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 Patient is bedbound. He is [facility] for multiple pressure wounds. EXAM: MRI PELVIS WITHOUT CONTRAST TECHNIQUE: Multiplanar multisequence MR imaging of the pelvis was performed. No intravenous contrast was administered. COMPARISON:  None. FINDINGS: Bones: Soft tissue ulcer overlying the right ischial tuberosity with severe surrounding soft tissue edema. No drainable fluid collection. No bone marrow edema or bone destruction of the right ischial tuberosity to suggest osteomyelitis. Edema in the right gluteus maximus muscle. Mild edema in the adductor magnus bilaterally pain likely neurogenic. No hip fracture, dislocation or  avascular necrosis. No periosteal reaction or bone destruction. No aggressive  osseous lesion. Normal sacrum and sacroiliac joints. No SI joint widening or erosive changes. Degenerative disc disease with disc height loss at L5-S1. Articular cartilage and labrum Articular cartilage:  No chondral defect. Labrum: Grossly intact, but evaluation is limited by lack of intraarticular fluid. Joint or bursal effusion Joint effusion:  No hip joint effusion.  No SI joint effusion. Bursae:  No bursa formation. Muscles and tendons Flexors: Normal. Extensors: Normal. Abductors: Normal. Adductors: Mild nonspecific edema in the adductor magnus bilaterally. Gluteals: Generalize gluteal atrophy bilaterally. Muscle edema in the right gluteus maximus muscle adjacent to the decubitus ulcer. Hamstrings: Normal. Other findings Miscellaneous: No pelvic free fluid. No fluid collection or hematoma. No inguinal lymphadenopathy. No inguinal hernia. IMPRESSION: 1. Decubitus ulcer overlying the right ischial tuberosity without osteomyelitis. Surrounding soft tissue edema most consistent with cellulitis. Severe edema in the inferior right gluteus maximus muscle most concerning for infectious myositis. No drainable fluid collection to suggest an abscess. Electronically Signed   By: Elige KoHetal  Patel   On: 09/19/2016 08:19     ASSESSMENT AND PLAN:   Patient is 66 year old with C3-c4 and C4 quadriplegia secondary to trauma in 2011 with decubitus ulcer  1. Cellulitis surrounding right buttock decubitus ulcer present on admission We will treat with IV vancomycin and Zosyn Surgical consult appreciated MRI pelvis showed no abscess or osteomyelitis Wound care consult Likely d/c in AM with PO abx  2. Diabetes type 2 Sliding scale insulin Continue metformin  3. Hyperlipidemia Lipitor  4. Previous history of CVA continue Plavix  5. History of upper extremity swelling and fluid retention Lasix  7.  Lovenox for DVT prophylaxis  All  the records are reviewed and case discussed with Care Management/Social Worker Management plans discussed with the patient, family and they are in agreement.  CODE STATUS: FULL CODE  DVT Prophylaxis: SCDs  TOTAL TIME TAKING CARE OF THIS PATIENT: 40 minutes.   POSSIBLE D/C IN 1-2 DAYS, DEPENDING ON CLINICAL CONDITION.  Milagros LollSudini, Jadore Veals R M.D on 09/19/2016 at 9:48 PM  Between 7am to 6pm - Pager - [phone]  After 6pm go to [email or web address] - password [email or web address] [facility]  [facility] [place] [facility]  Office  [phone]  CC: Primary care physician; Duanne LimerickJones, Deanna C, MD  Note: This dictation was prepared with Dragon dictation along with smaller phrase technology. Any transcriptional errors that result from this process are unintentional.

## 2016-09-20 LAB — GLUCOSE, CAPILLARY
GLUCOSE-CAPILLARY: 115 mg/dL — AB (ref 65–99)
Glucose-Capillary: 118 mg/dL — ABNORMAL HIGH (ref 65–99)

## 2016-09-20 MED ORDER — AMOXICILLIN-POT CLAVULANATE 875-125 MG PO TABS: 1.0000 | ORAL_TABLET | Freq: Two times a day (BID) | ORAL | 0 refills | Status: AC

## 2016-09-20 NOTE — Progress Notes (Signed)
  Patient revisited with this morning.  He appears lethargic but will awaken and respond to some questions.  Wounds were re-evaluated. Unchanged per yesterday. No evidence of worsening infection or indications for debridement.  The Gen. surgery service will sign off at this point. Please call again if any further assistance is required for this patient's care during this hospital stay.  Ricarda Frameharles Rino Hosea, MD [facility] General Surgeon [facility]  Day [facility] [phone](7a-7p) [phone] Night [facility] [phone](7p-7a) [phone]

## 2016-09-20 NOTE — Progress Notes (Signed)
Verified by MD that it is okay to give metformin for blood glucose of 115.

## 2016-09-20 NOTE — Discharge Instructions (Signed)
Resume diet and activity as before  Wound care per wound care center

## 2016-09-20 NOTE — Care Management Important Message (Signed)
Important Message  Patient Details  Name: [name] MRN: [id number] Date of Birth: 1950-03-31   Medicare Important Message Given:  N/A - LOS <3 / Initial given by admissions    Chapman FitchBOWEN, Antwuan Eckley T, RN 09/20/2016, 3:23 PM

## 2016-09-20 NOTE — Care Management (Signed)
Patient admitted with Infected decubitus ulcer present on admission.  Patient history of quadriplegia.  PCP Yetta BarreJones. Patient lives at home with wife.  Wife states that patient has "all the equipment he needs".  Sit to stand, power chair, hospital bed with air mattress.  Wife states that they have preciously had home health services, but "I will never allow anyone else to come in my home".   No RNCM needs identified.  RNCM signing off

## 2016-09-20 NOTE — Plan of Care (Signed)
Problem: Skin Integrity: Goal: Risk for impaired skin integrity will decrease Outcome: Progressing Patient is geing followed by wound care.  Problem: Activity: Goal: Risk for activity intolerance will decrease Outcome: Not Progressing Patient is bed bound.

## 2016-09-20 NOTE — Progress Notes (Signed)
Pt A and O x 1. VSS. Pt tolerating diet well with low appetite. No complaints of pain or nausea. IV removed intact, prescriptions given. Pt;s wife voiced understanding of discharge instructions with no further questions. Pt discharged via home wheelchair with wife as she is to transport him home via [place].

## 2016-09-20 NOTE — Progress Notes (Signed)
Notified Dr. Elpidio AnisSudini that pt is lethargic this AM and is only alert to self. No new orders received will continue to monitor.

## 2016-09-21 ENCOUNTER — Telehealth: Payer: Self-pay

## 2016-09-21 NOTE — Telephone Encounter (Signed)
I have made the 1st attempt to contact the patient or family member in charge, in order to follow up from recently being discharged from the hospital. I left a message on voicemail but I will make another attempt at a different time.  

## 2016-09-22 LAB — AEROBIC CULTURE  (SUPERFICIAL SPECIMEN)

## 2016-09-22 LAB — AEROBIC CULTURE W GRAM STAIN (SUPERFICIAL SPECIMEN)

## 2016-09-23 LAB — CULTURE, BLOOD (ROUTINE X 2)
Culture: NO GROWTH
Culture: NO GROWTH
Special Requests: ADEQUATE
Special Requests: ADEQUATE

## 2016-09-24 NOTE — Discharge Summary (Signed)
[facility] - [place] at [place]   PATIENT NAME: [name]    MR#:  [id number]  DATE OF BIRTH:  [birth year]  DATE OF ADMISSION:  09/18/2016 ADMITTING PHYSICIAN: Auburn Bilberry, MD  DATE OF DISCHARGE: 09/20/2016  4:21 PM  PRIMARY CARE PHYSICIAN: Duanne Limerick, MD   ADMISSION DIAGNOSIS:  Decubitus skin ulcer [L89.90] Cellulitis of right buttock [L03.317] Decubitus ulcer of right buttock, unspecified ulcer stage [L89.319]  DISCHARGE DIAGNOSIS:  Active Problems:   Decubitus ulcer   SECONDARY DIAGNOSIS:   Past Medical History:  Diagnosis Date  . Allergy   . Anxiety   . CAD (coronary artery disease)   . Diabetes mellitus without complication (HCC)   . GERD (gastroesophageal reflux disease)   . Hyperlipidemia   . Quadriplegia (HCC)   . Seizures (HCC)   . Spinal cord disease (HCC)   . Stroke [facility])      ADMITTING HISTORY  HISTORY OF PRESENT ILLNESS: [name]  is a 66 y.o. 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 Who has been followed at the wound care center for multiple pressure wounds. Patient went for follow-up today and was seen by Dr. Baltazar Najjar who recommend pt come to hospital to evaluate the wound. Patient has a new right buttock wound which was purulent and deep in the bright did at bedside at the wound care center. And was sent here. Patient has not had any fevers chills.   HOSPITAL COURSE:   * Cellulitis around right buttock decubitus ulcer. Present on admission Treated with IV vancomycin and Zosyn to start. Surgical consult obtained and patient did not need any debridement. MRI pelvis showed no abscess or osteomyelitis. Wound care team saw the patient. Resting change instructions given at discharge. Changed antibiotics to Augmentin for 5 more days after discharge. Patient will continue his follow-up at wound care center.  * Diabetes mellitus type 2. Continue home medications.  No change in home  medications at discharge. Augmentin added. Discharged home in stable condition.  CONSULTS OBTAINED:  Treatment Team:  Ricarda Frame, MD  DRUG ALLERGIES:  No Known Allergies  DISCHARGE MEDICATIONS:   Discharge Medication List as of 09/20/2016  2:59 PM    START taking these medications   Details  amoxicillin-clavulanate (AUGMENTIN) 875-125 MG tablet Take 1 tablet by mouth 2 (two) times daily., Starting Thu 09/20/2016, Until Tue 09/25/2016, Normal      CONTINUE these medications which have NOT CHANGED   Details  atorvastatin (LIPITOR) 10 MG tablet Take 10 mg by mouth daily., Historical Med    baclofen (LIORESAL) 20 MG tablet Take 40 mg by mouth at bedtime., Historical Med    citalopram (CELEXA) 40 MG tablet Take 1 tablet (40 mg total) by mouth daily., Starting Tue 07/31/2016, Normal    clopidogrel (PLAVIX) 75 MG tablet Take 75 mg by mouth daily., Historical Med    diazepam (VALIUM) 5 MG tablet Take 5-10 mg by mouth 3 (three) times daily as needed for muscle spasms. Take 5 mg in the morning, 5 mg at noon and 10 mg every night at bedtime as needed for spasms., Starting Wed 03/02/2015, Historical Med    furosemide (LASIX) 20 MG tablet Take 1 tablet (20 mg total) by mouth daily., Starting Tue 05/15/2016, Normal    levETIRAcetam (KEPPRA) 750 MG tablet Take 750 mg by mouth 3 (three) times daily., Historical Med    metFORMIN (GLUCOPHAGE) 500 MG tablet Take 500 mg by mouth 2 (two) times  daily with a meal., Historical Med    mirtazapine (REMERON) 15 MG tablet Take 15 mg by mouth at bedtime. Dr. Carlena HurlFiler, Historical Med    oxybutynin (DITROPAN) 5 MG tablet Take 5 mg by mouth 2 (two) times daily., Historical Med    pantoprazole (PROTONIX) 40 MG tablet Take 1 tablet (40 mg total) by mouth daily., Starting Tue 05/15/2016, Normal        Today   VITAL SIGNS:  Blood pressure (!) 109/40, pulse 72, temperature 98.1 F (36.7 C), temperature source Oral, resp. rate 18, height 5\' 10"  (1.778 m),  weight 70.9 kg (156 lb 4.9 oz), SpO2 93 %.  I/O:  No intake or output data in the 24 hours ending 09/24/16 1444  PHYSICAL EXAMINATION:  Physical Exam  GENERAL:  66 y.o.-year-old patient lying in the bed with no acute distress.  LUNGS: Normal breath sounds bilaterally, no wheezing, rales,rhonchi or crepitation. No use of accessory muscles of respiration.  CARDIOVASCULAR: S1, S2 normal. No murmurs, rubs, or gallops.  ABDOMEN: Soft, non-tender, non-distended. Bowel sounds present. No organomegaly or mass.  PSYCHIATRIC: The patient is awake. SKIN: Superficial right shoulder and left buttock ulcers. Deep right buttock ulcer with surrounding erythema  DATA REVIEW:   CBC  Recent Labs Lab 09/19/16 0433  WBC 9.6  HGB 9.0*  HCT 27.0*  PLT 371    Chemistries   Recent Labs Lab 09/18/16 1737 09/19/16 0433  NA 130* 136  K 4.3 3.7  CL 98* 103  CO2 24 27  GLUCOSE 83 95  BUN 13 12  CREATININE 0.89 0.77  CALCIUM 8.5* 8.4*  AST 22  --   ALT 16*  --   ALKPHOS 109  --   BILITOT 0.6  --     Cardiac Enzymes No results for input(s): TROPONINI in the last 168 hours.  Microbiology Results  Results for orders placed or performed during the hospital encounter of 09/18/16  Aerobic Culture (superficial specimen)     Status: Abnormal   Collection Time: 09/18/16  4:55 PM  Result Value Ref Range Status   Specimen Description ABSCESS  Final   Special Requests RIGHT GLUT  Final   Gram Stain   Final    FEW WBC PRESENT, PREDOMINANTLY PMN ABUNDANT GRAM POSITIVE COCCI MODERATE GRAM NEGATIVE RODS    Culture (A)  Final    MULTIPLE ORGANISMS PRESENT, NONE PREDOMINANT NO STAPHYLOCOCCUS AUREUS ISOLATED NO GROUP A STREP (S.PYOGENES) ISOLATED Performed at [facility], [address] [place], [place] [address]    Report Status 09/22/2016 FINAL  Final  Culture, blood (routine x 2)     Status: None   Collection Time: 09/18/16  5:37 PM  Result Value Ref Range Status   Specimen  Description BLOOD BLOOD LEFT ARM  Final   Special Requests   Final    BOTTLES DRAWN AEROBIC AND ANAEROBIC Blood Culture adequate volume   Culture NO GROWTH 5 DAYS  Final   Report Status 09/23/2016 FINAL  Final  Culture, blood (routine x 2)     Status: None   Collection Time: 09/18/16  5:37 PM  Result Value Ref Range Status   Specimen Description BLOOD BLOOD RIGHT ARM  Final   Special Requests   Final    BOTTLES DRAWN AEROBIC AND ANAEROBIC Blood Culture adequate volume   Culture NO GROWTH 5 DAYS  Final   Report Status 09/23/2016 FINAL  Final    RADIOLOGY:  No results found.  Follow up with PCP in  1 week.  Management plans discussed with the patient, family and they are in agreement.  CODE STATUS:  Code Status History    Date Active Date Inactive Code Status Order ID Comments User Context   09/20/2016 12:48 PM 09/20/2016  7:27 PM DNR [id number]  Milagros LollSudini, Jasira Robinson, MD Inpatient   09/18/2016  9:40 PM 09/20/2016 12:48 PM Full Code [id number]  Auburn BilberryPatel, Shreyang, MD Inpatient   06/09/2016  4:20 PM 06/10/2016  9:57 PM Full Code [id number]  Adrian SaranMody, Sital, MD Inpatient   05/13/2015  4:12 AM 05/13/2015  7:29 PM Full Code [id number]  Oralia ManisWillis, David, MD Inpatient    Questions for Most Recent Historical Code Status (Order [id number])    Question Answer Comment   In the event of cardiac or respiratory ARREST Do not call a "code blue"    In the event of cardiac or respiratory ARREST Do not perform Intubation, CPR, defibrillation or ACLS    In the event of cardiac or respiratory ARREST Use medication by any route, position, wound care, and other measures to relive pain and suffering. May use oxygen, suction and manual treatment of airway obstruction as needed for comfort.         Advance Directive Documentation     Most Recent Value  Type of Advance Directive  Healthcare Power of Attorney  Pre-existing out of facility DNR order (yellow form or pink MOST form)  -  "MOST" Form in Place?  -      TOTAL TIME  TAKING CARE OF THIS PATIENT ON DAY OF DISCHARGE: more than 30 minutes.   Milagros LollSudini, Shareece Bultman R M.D on 09/24/2016 at 2:44 PM  Between 7am to 6pm - Pager - [phone]  After 6pm go to [email or web address] - password [email or web address] [email or web address]  [facility] [place] [facility]  Office  [phone]  CC: Primary care physician; Duanne LimerickJones, Deanna C, MD  Note: This dictation was prepared with Dragon dictation along with smaller phrase technology. Any transcriptional errors that result from this process are unintentional.

## 2016-09-25 ENCOUNTER — Encounter: Payer: Medicare Other | Admitting: Internal Medicine

## 2016-09-25 DIAGNOSIS — E1165 Type 2 diabetes mellitus with hyperglycemia: Secondary | ICD-10-CM | POA: Diagnosis not present

## 2016-09-25 DIAGNOSIS — I959 Hypotension, unspecified: Secondary | ICD-10-CM | POA: Diagnosis not present

## 2016-09-25 DIAGNOSIS — X58XXXS Exposure to other specified factors, sequela: Secondary | ICD-10-CM | POA: Diagnosis not present

## 2016-09-25 DIAGNOSIS — Z7984 Long term (current) use of oral hypoglycemic drugs: Secondary | ICD-10-CM | POA: Diagnosis not present

## 2016-09-25 DIAGNOSIS — S51011A Laceration without foreign body of right elbow, initial encounter: Secondary | ICD-10-CM | POA: Diagnosis not present

## 2016-09-25 DIAGNOSIS — L89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, stage 3: Secondary | ICD-10-CM | POA: Diagnosis not present

## 2016-09-25 DIAGNOSIS — Z8673 Personal history of transient ischemic attack (TIA), and cerebral infarction without residual deficits: Secondary | ICD-10-CM | POA: Diagnosis not present

## 2016-09-25 DIAGNOSIS — R569 Unspecified convulsions: Secondary | ICD-10-CM | POA: Diagnosis not present

## 2016-09-25 DIAGNOSIS — E785 Hyperlipidemia, unspecified: Secondary | ICD-10-CM | POA: Diagnosis not present

## 2016-09-25 DIAGNOSIS — F329 Major depressive disorder, single episode, unspecified: Secondary | ICD-10-CM | POA: Diagnosis not present

## 2016-09-25 DIAGNOSIS — L89310 Pressure ulcer of right buttock, unstageable: Secondary | ICD-10-CM | POA: Diagnosis not present

## 2016-09-25 DIAGNOSIS — G8252 Quadriplegia, C1-C4 incomplete: Secondary | ICD-10-CM | POA: Diagnosis not present

## 2016-09-25 DIAGNOSIS — L89322 Pressure ulcer of left buttock, stage 2: Secondary | ICD-10-CM | POA: Diagnosis not present

## 2016-09-25 DIAGNOSIS — K219 Gastro-esophageal reflux disease without esophagitis: Secondary | ICD-10-CM | POA: Diagnosis not present

## 2016-09-25 DIAGNOSIS — S14103S Unspecified injury at C3 level of cervical spinal cord, sequela: Secondary | ICD-10-CM | POA: Diagnosis not present

## 2016-09-26 NOTE — Progress Notes (Addendum)
[name], [name] ([id number]) Visit Report for 09/25/2016 Debridement Details Patient Name: [name], [name]. Date of Service: 09/25/2016 3:30 PM Medical Record Number: [id number] Patient Account Number: [id number] Date of Birth/Sex: [birth year] (66 y.o. Male) Treating RN: Phillis Haggis Primary Care Provider: Elizabeth Sauer Other Clinician: Referring Provider: Elizabeth Sauer Treating Provider/Extender: Altamese Bacon in Treatment: 35 Debridement Performed for Wound #5 Right Gluteus Assessment: Performed By: Physician Maxwell Caul, MD Debridement: Debridement Pre-procedure Verification/Time Out Yes - 16:25 Taken: Start Time: 16:26 Pain Control: Lidocaine 4% Topical Solution Level: Skin/Subcutaneous Tissue Total Area Debrided (L x 2.6 (cm) x 2.8 (cm) = 7.28 (cm) W): Tissue and other Viable, Non-Viable, Exudate, Fibrin/Slough, Subcutaneous material debrided: Instrument: Curette, Forceps, Scissors Bleeding: Minimum Hemostasis Achieved: Pressure End Time: 16:29 Procedural Pain: 0 Post Procedural Pain: 0 Response to Treatment: Procedure was tolerated well Post Debridement Measurements of Total Wound Length: (cm) 2.6 Stage: Unstageable/Unclassified Width: (cm) 2.8 Depth: (cm) 0.5 Volume: (cm) 2.859 Character of Wound/Ulcer Post Requires Further Debridement: Debridement Post Procedure Diagnosis Same as Pre-procedure Electronic Signature(s) Signed: 09/27/2016 3:50:17 PM By: Baltazar Najjar MD Signed: 09/27/2016 4:42:53 PM By: Tawny Hopping ([id number]) Previous Signature: 09/25/2016 5:31:33 PM Version By: Alejandro Mulling Previous Signature: 09/25/2016 6:05:23 PM Version By: Baltazar Najjar MD Entered By: Alejandro Mulling on 09/27/2016 14:07:47 Gehling, [name] ([id number]) -------------------------------------------------------------------------------- HPI Details Patient Name: [name], [name] Date of Service: 09/25/2016 3:30 PM Medical  Record Number: [id number] Patient Account Number: [id number] Date of Birth/Sex: [birth year] (66 y.o. 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 He has lower extremity sensation and can move his legs but has no/limited control over his arms. His wife accompanies him today and states that in the late spring or early summer of 2017 the patient became very depressed. He refused the refused to mobilize and he developed several pressure ulcers on his back. Most of these have healed however they have a recalcitrant area over the right scapula. They've been using Santyl on this for at least the last month. In terms of depression the patient is doing better now on an antidepressant. He saw his primary physician on 01/12/16 at which time there was apparently green drainage coming out of this area [Dr. Deana Jones]. Dr. Yetta Barre works in the [facility]. He has completed this Septra. Otherwise looking through [facility] notes that he has a history of seizures. He also had a stroke in 2008 he follows with neurology. He also has type 2 diabetes on Glucophage, hyperlipidemia and gastroesophageal reflux. He takes Plavix for stroke prevention and Keppra for seizure prophylaxis. 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 02/08/16; this is a patient with a pressure ulcer over the right scapula. His wife has been doing the dressing with Santyl and border foam change every second day. When he arrived here 2 weeks ago we did a fairly extensive mechanical debridement. We are asked medical  modalities to go out to the home and see what they might be eligible for in terms of pressure-relief surfaces [level 2] but the wife states that they have not heard from them. 03/20/15; this  is a patient we haven't seen in 5-6 weeks. This was largely due to transportation issues. They've been using Santyl and border foam changing every second day for a pressure injury over the right scapula. The patient has a C3-C4 spinal injury. We had also asked for a review by the people who supplied DME to look at his wheelchair cushion, mattress etc. I don't know that this ever happened. In the meantime the wound has done remarkably well current measurements 1.8 x 2 x 0.1 04/03/16; the patient's dimensions have gone up to 3 cm in diameter quite a deterioration from last time. He is also complaining of pain in this area which is new. 04/10/16; 3 x 1.5 x 0.1. No difference from last week. Culture I did last week was negative he has completed antibiotics. 04/24/16 2.4 x 2 x 0.1; wound generally looks smaller. Middle area that I had to debrided last time looks healthier. His son is fashioned a large piece of foam cut out where the patient's wound with hit the back of his wheelchair 05/01/16; wound is a same size however the surface of this looks better. The middle innkeeper area required a repeat debridement 05/15/16; wound is down and dimensions granulation still looks healthy. The medial aspect of this wound is now the deeper Divot. We'll see how this responds to further healing. We're using Hydrofera Blue 06/05/16; Wound 1.7x1.1x0.1 still using hyudrofera blue 06/26/16; the patient arrives back in clinic after a three-week hiatus. He was hospitalized from 06/09/16 through 06/10/16. He was found to be confused. CT scan of the head showed nothing really acute. His sodium was [name], [name] ([id number]) low at 131. He was rehydrated. He was felt to have a UTI and given antibiotics and antibiotics at discharge although  his final culture result only showed multiple organisms. His wife says today that at the time of the hospitalization they discovered a large intact blister over the large aspect of his left calcaneus. This is recently ruptured. The wound on his right scapula is somewhat larger. More his wife is concerned about his current status. She states that he is still confused sleeps for long periods. He is not having fever chills cough or diarrhea [1 loose bowel movement per day]. He continues to have a suprapubic catheter. She notes that he is not eating and drinking well. Patient states he just does not want to eat. He does not feel nauseated or vomit. 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 Admission sodium was 131 at discharge 139 BUN 22 and 1.22 at admission, 17 and 1 at discharge. 07/03/16; patient's mental status is back to normal. He has a new wound on the right elbow caused by traumatizing his elbow against the wall apparently at the dermatologist office last week. We continue with the original wound on the right scapula and the wound from 2 weeks ago on his left heel. We have been using silver alginate to the area on the heel. 08/14/16; patient has not been here in almost 6 weeks. When he was here last time he had the pressure ulcer on the right scapula, atraumatic wound on the right lateral elbow and an area on his left heel. His wife states that at one point all of these were healed and she didn't really feel he needed to come back here. Since then the patient has developed a reopening of the area on the right scapula,  a stage II wound on the left buttock and a reopening of the area on the right lateral elbow. As usual his wife as a litany of complaints against home health, she is dismissing or is going to dismiss well care. She tells [place] she has a long list of supplies at home already for some reason they were  not felt to be eligible for a group 2 or 3 surface although they have a hospital bed at home but no offloading surface 08/28/16; the patient arrived today unfortunately incontinent of stool in spite of this the wounds all appear to be better including the right scapular area, his left buttock's left heel and the right lateral elbow. 09/18/16; this is a patient who arrived today for follow-up of a pressure area over his right scapula area, left buttock and there was an area on his right lateral elbow. He arrived in clinic today with a worrisome area over the right initial tuberosity/right buttock. This had a necrotic surface with draining purulence. He has not been systemically unwell. The area over the left elbow healed. He arrived in clinic today with dressing that hadn't been changed over the scapula for 2 or 3 days per her intake. His wife is previously fired home health 09/25/16;; the last time the patient was here he arrived with a new grossly infected wound over the right ischial tuberosity. Culture I did not show a specific pathogen. I did think this required admission to hospital and he was admitted from 09/18/16 through 09/20/16. Initially given bank and Zosyn but then discharged with 5 days' worth of Augmentin. An MRI was done that did not show osteomyelitis of the right ischial tuberosity however it did show cellulitis and possible myositis. He also has wounds on the left ischial tuberosity and the area over the right scapular area. Electronic Signature(s) Signed: 09/25/2016 6:05:23 PM By: Baltazar Najjar MD Entered By: Baltazar Najjar on 09/25/2016 16:50:36 Spruiell, [name] ([id number]) -------------------------------------------------------------------------------- Physical Exam Details Patient Name: [name], [name] Date of Service: 09/25/2016 3:30 PM Medical Record Number: [id number] Patient Account Number: [id number] Date of Birth/Sex: 1950-12-07 (66 y.o. Male) Treating RN:  Phillis Haggis Primary Care Provider: Elizabeth Sauer Other Clinician: Referring Provider: Elizabeth Sauer Treating Provider/Extender: Maxwell Caul Weeks in Treatment: 35 Constitutional Sitting or standing Blood Pressure is within target range for patient.. Pulse regular and within target range for patient.Marland Kitchen Respirations regular, non-labored and within target range.. Temperature is normal and within the target range for the patient.Marland Kitchen appears in no distress. Patient appears to be well. Notes Wound exam oRight ischial tuberosity; aggressively provided using pickups and scalpel of necrotic surface material. Then using a #5 curet necrotic material including fat and underlying muscle removed. He has undermining of perhaps 0.75 cm circumferentially. The surrounding erythema is a lot better oLeft ischial tuberosity; smaller wound still with a nonviable surface. oRight scapula area looks a lot better probably because he is been on bed rest more Electronic Signature(s) Signed: 09/25/2016 6:05:23 PM By: Baltazar Najjar MD Entered By: Baltazar Najjar on 09/25/2016 16:53:05 Slivinski, [name] ([id number]) -------------------------------------------------------------------------------- Physician Orders Details Patient Name: [name], [name] Date of Service: 09/25/2016 3:30 PM Medical Record Number: [id number] Patient Account Number: [id number] Date of Birth/Sex: 1950/11/24 (66 y.o. Male) Treating RN: Phillis Haggis Primary Care Provider: Elizabeth Sauer Other Clinician: Referring Provider: Elizabeth Sauer Treating Provider/Extender: Altamese Linton in Treatment: 20 Verbal / Phone Orders: Yes Clinician: Ashok Cordia, Debi Read Back and Verified: Yes Diagnosis Coding Wound Cleansing Wound #1  Right,Proximal Back o Cleanse wound with mild soap and water Wound #4 Left Gluteus o Cleanse wound with mild soap and water Wound #5 Right Gluteus o Cleanse wound with mild soap and  water Anesthetic Wound #1 Right,Proximal Back o Topical Lidocaine 4% cream applied to wound bed prior to debridement Wound #4 Left Gluteus o Topical Lidocaine 4% cream applied to wound bed prior to debridement Wound #5 Right Gluteus o Topical Lidocaine 4% cream applied to wound bed prior to debridement Skin Barriers/Peri-Wound Care Wound #1 Right,Proximal Back o Skin Prep Wound #4 Left Gluteus o Skin Prep Wound #5 Right Gluteus o Skin Prep Primary Wound Dressing Wound #1 Right,Proximal Back o Aquacel Ag Wound #4 Left Gluteus o Aquacel Ag Sleep, Nevyn C. ([id number]) Wound #5 Right Gluteus o Aquacel Ag Secondary Dressing Wound #1 Right,Proximal Back o Dry Gauze o Boardered Foam Dressing Wound #4 Left Gluteus o Dry Gauze o Boardered Foam Dressing Wound #5 Right Gluteus o Dry Gauze o Boardered Foam Dressing Dressing Change Frequency Wound #1 Right,Proximal Back o Change dressing every day. Wound #4 Left Gluteus o Change dressing every day. Wound #5 Right Gluteus o Change dressing every day. Follow-up Appointments Wound #1 Right,Proximal Back o Return Appointment in 1 week. Wound #4 Left Gluteus o Return Appointment in 1 week. Wound #5 Right Gluteus o Return Appointment in 1 week. Off-Loading Wound #1 Right,Proximal Back o Roho cushion for wheelchair o Turn and reposition every 2 hours Wound #4 Left Gluteus o Roho cushion for wheelchair o Turn and reposition every 2 hours Wound #5 Right Gluteus o Roho cushion for wheelchair [name], [name] ([id number]) o Turn and reposition every 2 hours Additional Orders / Instructions Wound #1 Right,Proximal Back o Increase protein intake. Wound #4 Left Gluteus o Increase protein intake. Wound #5 Right Gluteus o Increase protein intake. Electronic Signature(s) Signed: 09/25/2016 5:31:33 PM By: Alejandro Mulling Signed: 09/25/2016 6:05:23 PM By: Baltazar Najjar  MD Entered By: Alejandro Mulling on 09/25/2016 16:29:39 Steinhoff, [name] ([id number]) -------------------------------------------------------------------------------- Problem List Details Patient Name: [name], [name] Date of Service: 09/25/2016 3:30 PM Medical Record Number: [id number] Patient Account Number: [id number] Date of Birth/Sex: Jan 04, 1951 (66 y.o. Male) Treating RN: Phillis Haggis Primary Care Provider: Elizabeth Sauer Other Clinician: Referring Provider: Elizabeth Sauer Treating Provider/Extender: Maxwell Caul Weeks in Treatment: 47 Active Problems ICD-10 Encounter Code Description Active Date Diagnosis 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, stage 3 01/24/2016 Yes S14.103S Unspecified injury at C3 level of cervical spinal cord, 01/24/2016 Yes sequela S51.011A Laceration without foreign body of right elbow, initial 07/03/2016 Yes encounter L89.322 Pressure ulcer of left buttock, stage 2 08/14/2016 Yes L89.310 Pressure ulcer of right buttock, unstageable 09/18/2016 Yes L03.317 Cellulitis of buttock 09/18/2016 Yes Inactive Problems Resolved Problems ICD-10 Code Description Active Date Resolved Date L89.622 Pressure ulcer of left heel, stage 2 06/26/2016 06/26/2016 Electronic Signature(s) Signed: 09/25/2016 6:05:23 PM By: Baltazar Najjar MD Entered By: Baltazar Najjar on 09/25/2016 16:44:36 Cuccaro, [name] ([id number]) Donahue, [name] ([id number]) -------------------------------------------------------------------------------- Progress Note Details Patient Name: [name] Bras C. Date of Service: 09/25/2016 3:30 PM Medical Record Number: [id number] Patient Account Number: [id number] Date of Birth/Sex: Apr 09, 1950 (66 y.o. 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 He  has lower extremity sensation and can move his legs but has no/limited control over his arms. His wife accompanies him today and states that in the late spring or early summer of 2017 the patient became very depressed. He refused the refused to mobilize and he developed several pressure ulcers on his back. Most of these have healed however they have a recalcitrant area over the right scapula. They've been using Santyl on this for at least the last month. In terms of depression the patient is doing better now on an antidepressant. He saw his primary physician on 01/12/16 at which time there was apparently green drainage coming out of this area [Dr. Deana Jones]. Dr. Yetta Barre works in the [facility]. He has completed this Septra. Otherwise looking through [facility] notes that he has a history of seizures. He also had a stroke in 2008 he follows with neurology. He also has type 2 diabetes on Glucophage, hyperlipidemia and gastroesophageal reflux. He takes Plavix for stroke prevention and Keppra for seizure prophylaxis. 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 02/08/16; this is a patient with a pressure ulcer over the right scapula. His wife has been doing the dressing with Santyl and border foam change every second day. When he arrived here 2 weeks ago we did a fairly extensive mechanical debridement. We are asked medical modalities to go out to the home and see what they might be eligible for in terms of pressure-relief surfaces [level 2] but the wife states that they have not heard from them. 03/20/15; this is a patient we haven't seen in 5-6 weeks. This was largely due to transportation issues. They've been using Santyl and border foam changing every second day for a  pressure injury over the right scapula. The patient has a C3-C4 spinal injury. We had also asked for a review by the people who supplied DME to look at his wheelchair cushion, mattress etc. I don't know that this ever happened. In the meantime the wound has done remarkably well current measurements 1.8 x 2 x 0.1 04/03/16; the patient's dimensions have gone up to 3 cm in diameter quite a deterioration from last time. He is also complaining of pain in this area which is new. 04/10/16; 3 x 1.5 x 0.1. No difference from last week. Culture I did last week was negative he has completed antibiotics. 04/24/16 2.4 x 2 x 0.1; wound generally looks smaller. Middle area that I had to debrided last time looks healthier. His son is fashioned a large piece of foam cut out where the patient's wound with hit the back of his wheelchair 05/01/16; wound is a same size however the surface of this looks better. The middle innkeeper area required a repeat debridement 05/15/16; wound is down and dimensions granulation still looks healthy. The medial aspect of this wound is now the deeper Divot. We'll see how this responds to further healing. We're using Hydrofera Blue 06/05/16; Wound 1.7x1.1x0.1 still using hyudrofera blue 06/26/16; the patient arrives back in clinic after a three-week hiatus. He was hospitalized from 06/09/16 through [facility] ([id number]) 06/10/16. He was found to be confused. CT scan of the head showed nothing really acute. His sodium was low at 131. He was rehydrated. He was felt to have a UTI and given antibiotics and antibiotics at discharge although his final culture result only showed multiple organisms. His wife says today that at the  time of the hospitalization they discovered a large intact blister over the large aspect of his left calcaneus. This is recently ruptured. The wound on his right scapula is somewhat larger. More his wife is concerned about his current status. She states that he is  still confused sleeps for long periods. He is not having fever chills cough or diarrhea [1 loose bowel movement per day]. He continues to have a suprapubic catheter. She notes that he is not eating and drinking well. Patient states he just does not want to eat. He does not feel nauseated or vomit. 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 Admission sodium was 131 at discharge 139 BUN 22 and 1.22 at admission, 17 and 1 at discharge. 07/03/16; patient's mental status is back to normal. He has a new wound on the right elbow caused by traumatizing his elbow against the wall apparently at the dermatologist office last week. We continue with the original wound on the right scapula and the wound from 2 weeks ago on his left heel. We have been using silver alginate to the area on the heel. 08/14/16; patient has not been here in almost 6 weeks. When he was here last time he had the pressure ulcer on the right scapula, atraumatic wound on the right lateral elbow and an area on his left heel. His wife states that at one point all of these were healed and she didn't really feel he needed to come back here. Since then the patient has developed a reopening of the area on the right scapula, a stage II wound on the left buttock and a reopening of the area on the right lateral elbow. As usual his wife as a litany of complaints against home health, she is dismissing or is going to dismiss well care. She tells us she has a long list of supplies at home already for some reason they were not felt to be eligible for a group 2 or 3 surface although they have a hospital bed at home but no offloading surface 08/28/16; the patient arrived today unfortunately incontinent of stool in spite of this the wounds all appear to be better including the right scapular area, his left buttock's left heel and the right lateral elbow. 09/18/16; this is a  patient who arrived today for follow-up of a pressure area over his right scapula area, left buttock and there was an area on his right lateral elbow. He arrived in clinic today with a worrisome area over the right initial tuberosity/right buttock. This had a necrotic surface with draining purulence. He has not been systemically unwell. The area over the left elbow healed. He arrived in clinic today with dressing that hadn't been changed over the scapula for 2 or 3 days per her intake. His wife is previously fired home health 09/25/16;; the last time the patient was here he arrived with a new grossly infected wound over the right ischial tuberosity. Culture I did not show a specific pathogen. I did think this required admission to hospital and he was admitted from 09/18/16 through 09/20/16. Initially given bank and Zosyn but then discharged with 5 days' worth of Augmentin. An MRI was done that did not show osteomyelitis of the right ischial tuberosity however it did show cellulitis and possible myositis. He also has wounds on the left ischial tuberosity and the area over the right scapular area. Objective [name], [name] ([id number])  Constitutional Sitting or standing Blood Pressure is within target range for patient.. Pulse regular and within target range for patient.Marland Kitchen. Respirations regular, non-labored and within target range.. Temperature is normal and within the target range for the patient.Marland Kitchen. appears in no distress. Patient appears to be well. Vitals Time Taken: 3:40 PM, Height: 70 in, Weight: 187 lbs, BMI: 26.8, Temperature: 97.5 F, Pulse: 76 bpm, Respiratory Rate: 16 breaths/min, Blood Pressure: 126/61 mmHg. General Notes: Wound exam Right ischial tuberosity; aggressively provided using pickups and scalpel of necrotic surface material. Then using a #5 curet necrotic material including fat and underlying muscle removed. He has undermining of perhaps 0.75 cm circumferentially. The  surrounding erythema is a lot better Left ischial tuberosity; smaller wound still with a nonviable surface. Right scapula area looks a lot better probably because he is been on bed rest more Integumentary (Hair, Skin) Wound #1 status is Open. Original cause of wound was Pressure Injury. The wound is located on the Right,Proximal Back. The wound measures 1.8cm length x 1.4cm width x 0.1cm depth; 1.979cm^2 area and 0.198cm^3 volume. There is no tunneling or undermining noted. There is a large amount of serous drainage noted. The wound margin is distinct with the outline attached to the wound base. There is medium (34-66%) red granulation within the wound bed. There is a medium (34-66%) amount of necrotic tissue within the wound bed including Adherent Slough. Periwound temperature was noted as No Abnormality. The periwound has tenderness on palpation. Wound #4 status is Open. Original cause of wound was Pressure Injury. The wound is located on the Left Gluteus. The wound measures 1.2cm length x 1.2cm width x 0.2cm depth; 1.131cm^2 area and 0.226cm^3 volume. There is no tunneling or undermining noted. There is a large amount of serous drainage noted. The wound margin is flat and intact. There is no granulation within the wound bed. There is a large (67-100%) amount of necrotic tissue within the wound bed including Adherent Slough. The periwound skin appearance exhibited: Maceration. Periwound temperature was noted as No Abnormality. The periwound has tenderness on palpation. Wound #5 status is Open. Original cause of wound was Pressure Injury. The wound is located on the Right Gluteus. The wound measures 2.6cm length x 2.8cm width x 0.4cm depth; 5.718cm^2 area and 2.287cm^3 volume. There is Fat Layer (Subcutaneous Tissue) Exposed exposed. There is no tunneling or undermining noted. There is a large amount of serosanguineous drainage noted. The wound margin is flat and intact. There is no  granulation within the wound bed. There is a large (67-100%) amount of necrotic tissue within the wound bed including Eschar and Adherent Slough. The periwound skin appearance exhibited: Maceration. The periwound skin appearance did not exhibit: Callus, Crepitus, Excoriation, Induration, Rash, Scarring, Dry/Scaly, Atrophie Blanche, Cyanosis, Ecchymosis, Hemosiderin Staining, Mottled, Pallor, Rubor, Erythema. Periwound temperature was noted as No Abnormality. The periwound has tenderness on palpation. Assessment [name], [name] BienRICHARD C. ([id number]) Active Problems ICD-10 [id number] - Pressure ulcer of other site, stage 3 S14.103S - Unspecified injury at C3 level of cervical spinal cord, sequela S51.011A - Laceration without foreign body of right elbow, initial encounter L89.322 - Pressure ulcer of left buttock, stage 2 L89.310 - Pressure ulcer of right buttock, unstageable L03.317 - Cellulitis of buttock Procedures Wound #5 Pre-procedure diagnosis of Wound #5 is a Pressure Ulcer located on the Right Gluteus . There was a Skin/Subcutaneous Tissue Debridement ([id number]) debridement with total area of 7.28 sq cm performed by Maxwell CaulOBSON, MICHAEL G, MD. with the following instrument(s): Curette, Forceps, and  Scissors to remove Viable and Non-Viable tissue/material including Exudate, Fibrin/Slough, and Subcutaneous after achieving pain control using Lidocaine 4% Topical Solution. A time out was conducted at 16:25, prior to the start of the procedure. A Minimum amount of bleeding was controlled with Pressure. The procedure was tolerated well with a pain level of 0 throughout and a pain level of 0 following the procedure. Post Debridement Measurements: 2.6cm length x 2.8cm width x 0.5cm depth; 2.859cm^3 volume. Post debridement Stage noted as Unstageable/Unclassified. Character of Wound/Ulcer Post Debridement requires further debridement. Post procedure Diagnosis Wound #5: Same as Pre-Procedure Plan Wound  Cleansing: Wound #1 Right,Proximal Back: Cleanse wound with mild soap and water Wound #4 Left Gluteus: Cleanse wound with mild soap and water Wound #5 Right Gluteus: Cleanse wound with mild soap and water Anesthetic: Wound #1 Right,Proximal Back: Topical Lidocaine 4% cream applied to wound bed prior to debridement Wound #4 Left Gluteus: Topical Lidocaine 4% cream applied to wound bed prior to debridement Wound #5 Right Gluteus: [name], [name] ([id number]) Topical Lidocaine 4% cream applied to wound bed prior to debridement Skin Barriers/Peri-Wound Care: Wound #1 Right,Proximal Back: Skin Prep Wound #4 Left Gluteus: Skin Prep Wound #5 Right Gluteus: Skin Prep Primary Wound Dressing: Wound #1 Right,Proximal Back: Aquacel Ag Wound #4 Left Gluteus: Aquacel Ag Wound #5 Right Gluteus: Aquacel Ag Secondary Dressing: Wound #1 Right,Proximal Back: Dry Gauze Boardered Foam Dressing Wound #4 Left Gluteus: Dry Gauze Boardered Foam Dressing Wound #5 Right Gluteus: Dry Gauze Boardered Foam Dressing Dressing Change Frequency: Wound #1 Right,Proximal Back: Change dressing every day. Wound #4 Left Gluteus: Change dressing every day. Wound #5 Right Gluteus: Change dressing every day. Follow-up Appointments: Wound #1 Right,Proximal Back: Return Appointment in 1 week. Wound #4 Left Gluteus: Return Appointment in 1 week. Wound #5 Right Gluteus: Return Appointment in 1 week. Off-Loading: Wound #1 Right,Proximal Back: Roho cushion for wheelchair Turn and reposition every 2 hours Wound #4 Left Gluteus: Roho cushion for wheelchair Turn and reposition every 2 hours Wound #5 Right Gluteus: Roho cushion for wheelchair Turn and reposition every 2 hours Additional Orders / Instructions: Wound #1 Right,Proximal Back: [name], [name] ([id number]) Increase protein intake. Wound #4 Left Gluteus: Increase protein intake. Wound #5 Right Gluteus: Increase protein intake. #1  silver alginate to all wound areas #2 I have asked the patient to be up in his wheelchair for a maximum of 2 hours at a time otherwise the pressure relief on the bilateral ischial tuberosities will be inadequate #3 we will see him again next week, further debridement of both areas is likely to be necessary Electronic Signature(s) Signed: 10/16/2016 9:14:35 AM By: Elliot Gurney, BSN, RN, CWS, Kim RN, BSN Signed: 10/24/2016 7:50:37 AM By: Baltazar Najjar MD Previous Signature: 09/25/2016 6:05:23 PM Version By: Baltazar Najjar MD Entered By: Elliot Gurney, BSN, RN, CWS, Kim on 10/16/2016 09:09:52 Hem, [name] ([id number]) -------------------------------------------------------------------------------- SuperBill Details Patient Name: ELRIC, TIRADO C. Date of Service: 09/25/2016 Medical Record Number: [id number] Patient Account Number: [id number] Date of Birth/Sex: 05-05-1950 (66 y.o. Male) Treating RN: Phillis Haggis Primary Care Provider: Elizabeth Sauer Other Clinician: Referring Provider: Elizabeth Sauer Treating Provider/Extender: Maxwell Caul Weeks in Treatment: 35 Diagnosis Coding ICD-10 Codes Code Description [phone] Pressure ulcer of other site, stage 3 S14.103S Unspecified injury at C3 level of cervical spinal cord, sequela S51.011A Laceration without foreign body of right elbow, initial encounter L89.322 Pressure ulcer of left buttock, stage 2 L89.310 Pressure ulcer of right buttock, unstageable L03.317 Cellulitis of buttock Facility Procedures CPT4 Code:  [id number] Description: 11042 - DEB SUBQ TISSUE 20 SQ CM/< ICD-10 Description Diagnosis L89.310 Pressure ulcer of right buttock, unstageable Modifier: Quantity: 1 Physician Procedures CPT4 Code: [id number] Description: 11042 - WC PHYS SUBQ TISS 20 SQ CM ICD-10 Description Diagnosis L89.310 Pressure ulcer of right buttock, unstageable Modifier: Quantity: 1 Electronic Signature(s) Signed: 09/25/2016 6:05:23 PM By: Baltazar Najjar  MD Entered By: Baltazar Najjar on 09/25/2016 16:55:01

## 2016-09-26 NOTE — Progress Notes (Signed)
[name], [name] ([id number]) Visit Report for 09/25/2016 Arrival Information Details Patient Name: [name], [name]. Date of Service: 09/25/2016 3:30 PM Medical Record Number: [id number] Patient Account Number: [id number] Date of Birth/Sex: Jul 02, 1950 (66 y.o. Male) Treating RN: Phillis Haggis Primary Care Tiffanie Blassingame: Elizabeth Sauer Other Clinician: Referring Joei Frangos: Elizabeth Sauer Treating Dominique Calvey/Extender: Altamese [place] in Treatment: 35 Visit Information History Since Last Visit All ordered tests and consults were completed: No Patient Arrived: Wheel Chair Added or deleted any medications: No Arrival Time: 15:30 Any new allergies or adverse reactions: No Accompanied By: son Had a fall or experienced change in No activities of daily living that may affect Transfer Assistance: Michiel Sites Lift risk of falls: Patient Identification Verified: Yes Signs or symptoms of abuse/neglect since last No Secondary Verification Process Yes visito Completed: Hospitalized since last visit: No Patient Requires Transmission-Based No Has Dressing in Place as Prescribed: Yes Precautions: Pain Present Now: No Patient Has Alerts: Yes Electronic Signature(s) Signed: 09/25/2016 5:31:33 PM By: Alejandro Mulling Entered By: Alejandro Mulling on 09/25/2016 15:35:14 Shek, [name] ([id number]) -------------------------------------------------------------------------------- Encounter Discharge Information Details Patient Name: [name] Bras C. Date of Service: 09/25/2016 3:30 PM Medical Record Number: [id number] Patient Account Number: [id number] Date of Birth/Sex: [birth year] (66 y.o. Male) Treating RN: Phillis Haggis Primary Care Marshae Azam: Elizabeth Sauer Other Clinician: Referring Khyler Urda: Elizabeth Sauer Treating Hershey Knauer/Extender: Altamese [place] in Treatment: 68 Encounter Discharge Information Items Discharge Pain Level: 0 Discharge Condition: Stable Ambulatory Status:  Wheelchair Discharge Destination: Home Transportation: Private Auto Accompanied By: son Schedule Follow-up Appointment: Yes Medication Reconciliation completed and provided to Patient/Care No Tore Carreker: Provided on Clinical Summary of Care: 09/25/2016 Form Type Recipient Paper Patient [facility] Electronic Signature(s) Signed: 09/25/2016 5:31:33 PM By: Alejandro Mulling Previous Signature: 09/25/2016 4:46:42 PM Version By: Gwenlyn Perking Entered By: Alejandro Mulling on 09/25/2016 16:57:44 Hershkowitz, [name] ([id number]) -------------------------------------------------------------------------------- Lower Extremity Assessment Details Patient Name: [name], [name] Date of Service: 09/25/2016 3:30 PM Medical Record Number: [id number] Patient Account Number: [id number] Date of Birth/Sex: Aug 11, 1950 (66 y.o. Male) Treating RN: Phillis Haggis Primary Care Anieya Helman: Elizabeth Sauer Other Clinician: Referring Jenette Rayson: Elizabeth Sauer Treating Mikenzie Mccannon/Extender: Maxwell Caul Weeks in Treatment: 35 Electronic Signature(s) Signed: 09/25/2016 5:31:33 PM By: Alejandro Mulling Entered By: Alejandro Mulling on 09/25/2016 16:24:53 Taranto, [name] ([id number]) -------------------------------------------------------------------------------- Multi Wound Chart Details Patient Name: [name], [name] Date of Service: 09/25/2016 3:30 PM Medical Record Number: [id number] Patient Account Number: [id number] Date of Birth/Sex: 1950/06/08 (66 y.o. Male) Treating RN: Phillis Haggis Primary Care Princetta Uplinger: Elizabeth Sauer Other Clinician: Referring Jacqeline Broers: Elizabeth Sauer Treating Mandy Peeks/Extender: Maxwell Caul Weeks in Treatment: 35 Vital Signs Height(in): 70 Pulse(bpm): 76 Weight(lbs): 187 Blood Pressure 126/61 (mmHg): Body Mass Index(BMI): 27 Temperature(F): 97.5 Respiratory Rate 16 (breaths/min): Photos: [1:No Photos] [4:No Photos] [5:No Photos] Wound Location: [1:Right Back - Proximal] [4:Left  Gluteus] [5:Right Gluteus] Wounding Event: [1:Pressure Injury] [4:Pressure Injury] [5:Pressure Injury] Primary Etiology: [1:Pressure Ulcer] [4:Pressure Ulcer] [5:Pressure Ulcer] Comorbid History: [1:Coronary Artery Disease, Type II Diabetes, Quadriplegia, Seizure Disorder] [4:Coronary Artery Disease, Type II Diabetes, Quadriplegia, Seizure Disorder] [5:Coronary Artery Disease, Type II Diabetes, Quadriplegia, Seizure Disorder] Date Acquired: [1:11/24/2015] [4:07/09/2016] [5:09/10/2016] Weeks of Treatment: [1:35] [4:6] [5:1] Wound Status: [1:Open] [4:Open] [5:Open] Measurements L x W x D 1.8x1.4x0.1 [4:1.2x1.2x0.2] [5:2.6x2.8x0.4] (cm) Area (cm) : [1:1.979] [4:1.131] [5:5.718] Volume (cm) : [1:0.198] [4:0.226] [5:2.287] % Reduction in Area: [1:75.40%] [4:-620.40%] [5:-1.10%] % Reduction in Volume: 75.40% [4:-1312.50%] [5:-304.80%] Classification: [1:Category/Stage II] [4:Category/Stage II] [5:Unstageable/Unclassified] Exudate Amount: [1:Large] [4:Large] [5:Large] Exudate Type: [1:Serous] [4:Serous] [5:Serosanguineous]  Exudate Color: [1:amber] [4:amber] [5:red, brown] Foul Odor After [1:No] [4:No] [5:Yes] Cleansing: Odor Anticipated Due to N/A [4:N/A] [5:No] Product Use: Wound Margin: [1:Distinct, outline attached] [4:Flat and Intact] [5:Flat and Intact] Granulation Amount: [1:Medium (34-66%)] [4:None Present (0%)] [5:None Present (0%)] Granulation Quality: [1:Red] [4:N/A] [5:N/A] Necrotic Amount: [1:Medium (34-66%)] [4:Large (67-100%)] [5:Large (67-100%)] Necrotic Tissue: [1:Adherent Slough] [4:Adherent Slough] [5:Eschar, Adherent Slough] Epithelialization: None None None Debridement: N/A N/A Debridement ([id number]) Pre-procedure N/A N/A 16:25 Verification/Time Out Taken: Pain Control: N/A N/A Lidocaine 4% Topical Solution Tissue Debrided: N/A N/A Fibrin/Slough, Exudates, Subcutaneous Level: N/A N/A Skin/Subcutaneous Tissue Debridement Area (sq N/A N/A 7.28 cm): Instrument:  N/A N/A Curette, Forceps, Scissors Bleeding: N/A N/A Minimum Hemostasis Achieved: N/A N/A Pressure Procedural Pain: N/A N/A 0 Post Procedural Pain: N/A N/A 0 Debridement Treatment N/A N/A Procedure was tolerated Response: well Post Debridement N/A N/A 2.6x2.8x0.5 Measurements L x W x D (cm) Post Debridement N/A N/A 2.859 Volume: (cm) Post Debridement N/A N/A Unstageable/Unclassified Stage: Periwound Skin Texture: No Abnormalities Noted No Abnormalities Noted Excoriation: No Induration: No Callus: No Crepitus: No Rash: No Scarring: No Periwound Skin No Abnormalities Noted Maceration: Yes Maceration: Yes Moisture: Dry/Scaly: No Periwound Skin Color: No Abnormalities Noted No Abnormalities Noted Atrophie Blanche: No Cyanosis: No Ecchymosis: No Erythema: No Hemosiderin Staining: No Mottled: No Pallor: No Rubor: No Temperature: No Abnormality No Abnormality No Abnormality Tenderness on Yes Yes Yes Palpation: Wound Preparation: Ulcer Cleansing: Ulcer Cleansing: Ulcer Cleansing: Rinsed/Irrigated with Rinsed/Irrigated with Rinsed/Irrigated with Saline Saline Saline Gadomski, Deamonte C. ([id number]) Topical Anesthetic Topical Anesthetic Topical Anesthetic Applied: Other: lidocaine Applied: Other: lidocaine Applied: Other: lidocaine 4% 4% 4% Procedures Performed: N/A N/A Debridement Treatment Notes Electronic Signature(s) Signed: 09/25/2016 6:05:23 PM By: Linton Ham MD Entered By: Linton Ham on 09/25/2016 16:44:50 Weltz, Leslye Peer ([id number]) -------------------------------------------------------------------------------- Multi-Disciplinary Care Plan Details Patient Name: BASIM, BARTNIK C. Date of Service: 09/25/2016 3:30 PM Medical Record Number: [id number] Patient Account Number: [id number] Date of Birth/Sex: 02/12/1951 (66 y.o. Male) Treating RN: Ahmed Prima Primary Care Arryana Tolleson: Otilio Miu Other Clinician: Referring Qasim Diveley: Otilio Miu Treating  Jerrianne Hartin/Extender: Ricard Dillon Weeks in Treatment: 43 Active Inactive ` Nutrition Nursing Diagnoses: Imbalanced nutrition Impaired glucose control: actual or potential Goals: Patient/caregiver agrees to and verbalizes understanding of need to use nutritional supplements and/or vitamins as prescribed Date Initiated: 01/24/2016 Target Resolution Date: 03/27/2016 Goal Status: Active Patient/caregiver will maintain therapeutic glucose control Date Initiated: 01/24/2016 Target Resolution Date: 03/27/2016 Goal Status: Active Interventions: Assess patient nutrition upon admission and as needed per policy Provide education on elevated blood sugars and impact on wound healing Notes: ` Orientation to the Wound Care Program Nursing Diagnoses: Knowledge deficit related to the wound healing center program Goals: Patient/caregiver will verbalize understanding of the [place] Date Initiated: 01/24/2016 Target Resolution Date: 03/27/2016 Goal Status: Active Interventions: Provide education on orientation to the wound center Notes: QUITMAN, NORBERTO ([id number]) ` Pain, Acute or Chronic Nursing Diagnoses: Pain, acute or chronic: actual or potential Potential alteration in comfort, pain Goals: Patient will verbalize adequate pain control and receive pain control interventions during procedures as needed Date Initiated: 01/24/2016 Target Resolution Date: 03/27/2016 Goal Status: Active Patient/caregiver will verbalize adequate pain control between visits Date Initiated: 01/24/2016 Target Resolution Date: 03/27/2016 Goal Status: Active Patient/caregiver will verbalize comfort level met Date Initiated: 01/24/2016 Target Resolution Date: 03/27/2016 Goal Status: Active Interventions: Assess comfort goal upon admission Complete pain assessment as per visit requirements Notes: ` Pressure Nursing Diagnoses: Knowledge deficit related to  management of pressures  ulcers Potential for impaired tissue integrity related to pressure, friction, moisture, and shear Goals: Patient will remain free from development of additional pressure ulcers Date Initiated: 01/24/2016 Target Resolution Date: 03/27/2016 Goal Status: Active Interventions: Assess: immobility, friction, shearing, incontinence upon admission and as needed Assess offloading mechanisms upon admission and as needed Assess potential for pressure ulcer upon admission and as needed Provide education on pressure ulcers Notes: NORIS, KULINSKI ([id number]) Wound/Skin Impairment Nursing Diagnoses: Impaired tissue integrity Goals: Ulcer/skin breakdown will have a volume reduction of 30% by week 4 Date Initiated: 01/24/2016 Target Resolution Date: 03/27/2016 Goal Status: Active Ulcer/skin breakdown will have a volume reduction of 50% by week 8 Date Initiated: 01/24/2016 Target Resolution Date: 03/27/2016 Goal Status: Active Ulcer/skin breakdown will have a volume reduction of 80% by week 12 Date Initiated: 01/24/2016 Target Resolution Date: 03/27/2016 Goal Status: Active Interventions: Assess patient/caregiver ability to perform ulcer/skin care regimen upon admission and as needed Assess ulceration(s) every visit Notes: Electronic Signature(s) Signed: 09/25/2016 5:31:33 PM By: Alric Quan Entered By: Alric Quan on 09/25/2016 16:26:04 [place], Leslye Peer ([id number]) -------------------------------------------------------------------------------- Pain Assessment Details Patient Name: [name] Date of Service: 09/25/2016 3:30 PM Medical Record Number: [id number] Patient Account Number: [id number] Date of Birth/Sex: [birth year] (66 y.o. Male) Treating RN: Ahmed Prima Primary Care Zylpha Poynor: Otilio Miu Other Clinician: Referring Batool Majid: Otilio Miu Treating Gilford Lardizabal/Extender: Ricard Dillon Weeks in Treatment: 35 Active Problems Location of Pain Severity and  Description of Pain Patient Has Paino No Site Locations With Dressing Change: No Pain Management and Medication Current Pain Management: Electronic Signature(s) Signed: 09/25/2016 5:31:33 PM By: Alric Quan Entered By: Alric Quan on 09/25/2016 15:35:20 Bancroft, Leslye Peer ([id number]) -------------------------------------------------------------------------------- Patient/Caregiver Education Details Patient Name: [name]. Date of Service: 09/25/2016 3:30 PM Medical Record Number: [id number] Patient Account Number: [id number] Date of Birth/Gender: [birth year] (66 y.o. Male) Treating RN: Ahmed Prima Primary Care Physician: Otilio Miu Other Clinician: Referring Physician: Otilio Miu Treating Physician/Extender: Tito Dine in Treatment: 51 Education Assessment Education Provided To: Patient and Caregiver Education Topics Provided Wound/Skin Impairment: Handouts: Other: change dressing as ordered Methods: Demonstration, Explain/Verbal Responses: State content correctly Electronic Signature(s) Signed: 09/25/2016 5:31:33 PM By: Alric Quan Entered By: Alric Quan on 09/25/2016 16:57:55 Esch, Leslye Peer ([id number]) -------------------------------------------------------------------------------- Wound Assessment Details Patient Name: [name] Date of Service: 09/25/2016 3:30 PM Medical Record Number: [id number] Patient Account Number: [id number] Date of Birth/Sex: [birth year] (67 y.o. Male) Treating RN: Ahmed Prima Primary Care Llewellyn Schoenberger: Otilio Miu Other Clinician: Referring Shey Yott: Otilio Miu Treating Jla Reynolds/Extender: Ricard Dillon Weeks in Treatment: 35 Wound Status Wound Number: 1 Primary Pressure Ulcer Etiology: Wound Location: Right Back - Proximal Wound Open Wounding Event: Pressure Injury Status: Date Acquired: 11/24/2015 Comorbid Coronary Artery Disease, Type II Weeks Of Treatment: 35 History:  Diabetes, Quadriplegia, Seizure Clustered Wound: No Disorder Photos Photo Uploaded By: Alric Quan on 09/25/2016 16:59:52 Wound Measurements Length: (cm) 1.8 Width: (cm) 1.4 Depth: (cm) 0.1 Area: (cm) 1.979 Volume: (cm) 0.198 % Reduction in Area: 75.4% % Reduction in Volume: 75.4% Epithelialization: None Tunneling: No Undermining: No Wound Description Classification: Category/Stage II Foul Odor Af Wound Margin: Distinct, outline attached Slough/Fibri Exudate Amount: Large Exudate Type: Serous Exudate Color: amber ter Cleansing: No no Yes Wound Bed Granulation Amount: Medium (34-66%) Granulation Quality: Red Necrotic Amount: Medium (34-66%) Necrotic Quality: Adherent [address], [place] C. ([id number]) Periwound Skin Texture Texture Color No Abnormalities Noted: No No Abnormalities Noted: No Moisture Temperature / Pain No  Abnormalities Noted: No Temperature: No Abnormality Tenderness on Palpation: Yes Wound Preparation Ulcer Cleansing: Rinsed/Irrigated with Saline Topical Anesthetic Applied: Other: lidocaine 4%, Treatment Notes Wound #1 (Right, Proximal Back) 1. Cleansed with: Clean wound with Normal Saline 2. Anesthetic Topical Lidocaine 4% cream to wound bed prior to debridement 3. Peri-wound Care: Skin Prep 4. Dressing Applied: Aquacel Ag 5. Secondary Dressing Applied Bordered Foam Dressing Electronic Signature(s) Signed: 09/25/2016 5:31:33 PM By: Alric Quan Entered By: Alric Quan on 09/25/2016 16:23:10 Stella, Leslye Peer ([id number]) -------------------------------------------------------------------------------- Wound Assessment Details Patient Name: Houdek, Naquan C. Date of Service: 09/25/2016 3:30 PM Medical Record Number: [id number] Patient Account Number: [id number] Date of Birth/Sex: [birth year] (66 y.o. Male) Treating RN: Ahmed Prima Primary Care Halvor Behrend: Otilio Miu Other Clinician: Referring Arva Slaugh: Otilio Miu Treating Yohann Curl/Extender: Ricard Dillon Weeks in Treatment: 35 Wound Status Wound Number: 4 Primary Pressure Ulcer Etiology: Wound Location: Left Gluteus Wound Open Wounding Event: Pressure Injury Status: Date Acquired: 07/09/2016 Comorbid Coronary Artery Disease, Type II Weeks Of Treatment: 6 History: Diabetes, Quadriplegia, Seizure Clustered Wound: No Disorder Photos Photo Uploaded By: Alric Quan on 09/25/2016 16:59:53 Wound Measurements Length: (cm) 1.2 Width: (cm) 1.2 Depth: (cm) 0.2 Area: (cm) 1.131 Volume: (cm) 0.226 % Reduction in Area: -620.4% % Reduction in Volume: -1312.5% Epithelialization: None Tunneling: No Undermining: No Wound Description Classification: Category/Stage II Wound Margin: Flat and Intact Exudate Amount: Large Exudate Type: Serous Exudate Color: amber Foul Odor After Cleansing: No Slough/Fibrino Yes Wound Bed Granulation Amount: None Present (0%) Necrotic Amount: Large (67-100%) Necrotic Quality: [address], [place] C. ([id number]) Periwound Skin Texture Texture Color No Abnormalities Noted: No No Abnormalities Noted: No Moisture Temperature / Pain No Abnormalities Noted: No Temperature: No Abnormality Maceration: Yes Tenderness on Palpation: Yes Wound Preparation Ulcer Cleansing: Rinsed/Irrigated with Saline Topical Anesthetic Applied: Other: lidocaine 4%, Treatment Notes Wound #4 (Left Gluteus) 1. Cleansed with: Clean wound with Normal Saline 2. Anesthetic Topical Lidocaine 4% cream to wound bed prior to debridement 3. Peri-wound Care: Skin Prep 4. Dressing Applied: Aquacel Ag 5. Secondary Dressing Applied Bordered Foam Dressing Electronic Signature(s) Signed: 09/25/2016 5:31:33 PM By: Alric Quan Entered By: Alric Quan on 09/25/2016 16:24:20 Nelon, Leslye Peer ([id number]) -------------------------------------------------------------------------------- Wound Assessment  Details Patient Name: Millman, Kenyatte C. Date of Service: 09/25/2016 3:30 PM Medical Record Number: [id number] Patient Account Number: [id number] Date of Birth/Sex: 15-Jun-1950 (66 y.o. Male) Treating RN: Ahmed Prima Primary Care Rendi Mapel: Otilio Miu Other Clinician: Referring Kaydin Karbowski: Otilio Miu Treating Deshawnda Acrey/Extender: Ricard Dillon Weeks in Treatment: 35 Wound Status Wound Number: 5 Primary Pressure Ulcer Etiology: Wound Location: Right Gluteus Wound Open Wounding Event: Pressure Injury Status: Date Acquired: 09/10/2016 Comorbid Coronary Artery Disease, Type II Weeks Of Treatment: 1 History: Diabetes, Quadriplegia, Seizure Clustered Wound: No Disorder Photos Photo Uploaded By: Alric Quan on 09/25/2016 17:00:38 Wound Measurements Length: (cm) 2.6 Width: (cm) 2.8 Depth: (cm) 0.4 Area: (cm) 5.718 Volume: (cm) 2.287 % Reduction in Area: -1.1% % Reduction in Volume: -304.8% Epithelialization: None Tunneling: No Undermining: No Wound Description Classification: Unstageable/Unclassified Wound Margin: Flat and Intact Exudate Amount: Large Exudate Type: Serosanguineous Exudate Color: red, brown Foul Odor After Cleansing: Yes Due to Product Use: No Slough/Fibrino Yes Wound Bed Granulation Amount: None Present (0%) Exposed Structure Necrotic Amount: Large (67-100%) Fascia Exposed: No Necrotic Quality: Eschar, Adherent Slough Fat Layer (Subcutaneous Tissue) Exposed: Yes Tendon Exposed: No Wroe, Khameron C. ([id number]) Muscle Exposed: No Joint Exposed: No Bone Exposed: No Periwound Skin Texture Texture Color No Abnormalities Noted: No No Abnormalities Noted:  No Callus: No Atrophie Blanche: No Crepitus: No Cyanosis: No Excoriation: No Ecchymosis: No Induration: No Erythema: No Rash: No Hemosiderin Staining: No Scarring: No Mottled: No Pallor: No Moisture Rubor: No No Abnormalities Noted: No Dry / Scaly: No Temperature /  Pain Maceration: Yes Temperature: No Abnormality Tenderness on Palpation: Yes Wound Preparation Ulcer Cleansing: Rinsed/Irrigated with Saline Topical Anesthetic Applied: Other: lidocaine 4%, Treatment Notes Wound #5 (Right Gluteus) 1. Cleansed with: Clean wound with Normal Saline 2. Anesthetic Topical Lidocaine 4% cream to wound bed prior to debridement 3. Peri-wound Care: Skin Prep 4. Dressing Applied: Aquacel Ag 5. Secondary Dressing Applied Bordered Foam Dressing Electronic Signature(s) Signed: 09/25/2016 5:31:33 PM By: Alric Quan Entered By: Alric Quan on 09/25/2016 16:24:42 Adan, Leslye Peer ([id number]) -------------------------------------------------------------------------------- Vitals Details Patient Name: [name] Date of Service: 09/25/2016 3:30 PM Medical Record Number: [id number] Patient Account Number: [id number] Date of Birth/Sex: 1950/04/13 (66 y.o. Male) Treating RN: Ahmed Prima Primary Care Sulayman Manning: Otilio Miu Other Clinician: Referring Travares Nelles: Otilio Miu Treating Tremaine Earwood/Extender: Ricard Dillon Weeks in Treatment: 35 Vital Signs Time Taken: 15:40 Temperature (F): 97.5 Height (in): 70 Pulse (bpm): 76 Weight (lbs): 187 Respiratory Rate (breaths/min): 16 Body Mass Index (BMI): 26.8 Blood Pressure (mmHg): 126/61 Reference Range: 80 - 120 mg / dl Electronic Signature(s) Signed: 09/25/2016 5:31:33 PM By: Alric Quan Entered By: Alric Quan on 09/25/2016 15:44:09

## 2016-09-28 ENCOUNTER — Encounter (INDEPENDENT_AMBULATORY_CARE_PROVIDER_SITE_OTHER): Payer: Medicare Other

## 2016-09-28 ENCOUNTER — Ambulatory Visit (INDEPENDENT_AMBULATORY_CARE_PROVIDER_SITE_OTHER): Payer: Medicare Other | Admitting: Vascular Surgery

## 2016-10-02 ENCOUNTER — Inpatient Hospital Stay: Payer: Medicare Other | Admitting: Family Medicine

## 2016-10-02 ENCOUNTER — Telehealth: Payer: Self-pay | Admitting: Family Medicine

## 2016-10-02 NOTE — Telephone Encounter (Signed)
Patient wifew was called lvm to call us back to reschedule appt.

## 2016-10-04 ENCOUNTER — Encounter: Payer: Medicare Other | Attending: Physician Assistant | Admitting: Physician Assistant

## 2016-10-04 DIAGNOSIS — L89109 Pressure ulcer of unspecified part of back, unspecified stage: Secondary | ICD-10-CM | POA: Diagnosis not present

## 2016-10-04 DIAGNOSIS — F329 Major depressive disorder, single episode, unspecified: Secondary | ICD-10-CM | POA: Diagnosis not present

## 2016-10-04 DIAGNOSIS — S31819A Unspecified open wound of right buttock, initial encounter: Secondary | ICD-10-CM | POA: Diagnosis not present

## 2016-10-04 DIAGNOSIS — I959 Hypotension, unspecified: Secondary | ICD-10-CM | POA: Insufficient documentation

## 2016-10-04 DIAGNOSIS — Z8673 Personal history of transient ischemic attack (TIA), and cerebral infarction without residual deficits: Secondary | ICD-10-CM | POA: Diagnosis not present

## 2016-10-04 DIAGNOSIS — Z7984 Long term (current) use of oral hypoglycemic drugs: Secondary | ICD-10-CM | POA: Insufficient documentation

## 2016-10-04 DIAGNOSIS — E785 Hyperlipidemia, unspecified: Secondary | ICD-10-CM | POA: Diagnosis not present

## 2016-10-04 DIAGNOSIS — R569 Unspecified convulsions: Secondary | ICD-10-CM | POA: Diagnosis not present

## 2016-10-04 DIAGNOSIS — L89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, stage 3: Secondary | ICD-10-CM | POA: Insufficient documentation

## 2016-10-04 DIAGNOSIS — G8252 Quadriplegia, C1-C4 incomplete: Secondary | ICD-10-CM | POA: Insufficient documentation

## 2016-10-04 DIAGNOSIS — S14103S Unspecified injury at C3 level of cervical spinal cord, sequela: Secondary | ICD-10-CM | POA: Diagnosis not present

## 2016-10-04 DIAGNOSIS — K219 Gastro-esophageal reflux disease without esophagitis: Secondary | ICD-10-CM | POA: Insufficient documentation

## 2016-10-04 DIAGNOSIS — L03317 Cellulitis of buttock: Secondary | ICD-10-CM | POA: Insufficient documentation

## 2016-10-04 DIAGNOSIS — X58XXXS Exposure to other specified factors, sequela: Secondary | ICD-10-CM | POA: Insufficient documentation

## 2016-10-04 DIAGNOSIS — L89322 Pressure ulcer of left buttock, stage 2: Secondary | ICD-10-CM | POA: Diagnosis not present

## 2016-10-04 DIAGNOSIS — E119 Type 2 diabetes mellitus without complications: Secondary | ICD-10-CM | POA: Diagnosis not present

## 2016-10-04 DIAGNOSIS — S31829A Unspecified open wound of left buttock, initial encounter: Secondary | ICD-10-CM | POA: Diagnosis not present

## 2016-10-04 DIAGNOSIS — S51011A Laceration without foreign body of right elbow, initial encounter: Secondary | ICD-10-CM | POA: Insufficient documentation

## 2016-10-04 DIAGNOSIS — L89310 Pressure ulcer of right buttock, unstageable: Secondary | ICD-10-CM | POA: Diagnosis not present

## 2016-10-05 DIAGNOSIS — L89130 Pressure ulcer of right lower back, unstageable: Secondary | ICD-10-CM | POA: Diagnosis not present

## 2016-10-06 NOTE — Progress Notes (Signed)
GOR, VESTAL ([id number]) Visit Report for 10/04/2016 Arrival Information Details Patient Name: [name], [name]. Date of Service: 10/04/2016 3:00 PM Medical Record Number: [id number] Patient Account Number: [id number] Date of Birth/Sex: 1950/08/20 (66 y.o. Male) Treating RN: Cornell Barman Primary Care Melesio Madara: Otilio Miu Other Clinician: Referring Jamirra Curnow: Otilio Miu Treating Tabbitha Janvrin/Extender: STONE III, HOYT Weeks in Treatment: 65 Visit Information History Since Last Visit Added or deleted any medications: No Patient Arrived: Wheel Chair Any new allergies or adverse reactions: No Arrival Time: 15:12 Had a fall or experienced change in No activities of daily living that may affect Accompanied By: wife risk of falls: Transfer Assistance: [employer] fast streamer Signs or symptoms of abuse/neglect since last No Patient Identification Verified: Yes visito Secondary Verification Process Yes Hospitalized since last visit: No Completed: Has Dressing in Place as Prescribed: Yes Patient Requires Transmission-Based No Pain Present Now: No Precautions: Patient Has Alerts: Yes Electronic Signature(s) Signed: 10/04/2016 4:52:10 PM By: Gretta Cool, BSN, RN, CWS, Kim RN, BSN Entered By: Gretta Cool, BSN, RN, CWS, Kim on 10/04/2016 15:12:54 [name] [name] ([id number]) -------------------------------------------------------------------------------- Clinic Level of Care Assessment Details Patient Name: [name], [name] Date of Service: 10/04/2016 3:00 PM Medical Record Number: [id number] Patient Account Number: [id number] Date of Birth/Sex: 11/28/1950 (66 y.o. Male) Treating RN: Cornell Barman Primary Care Bernadette Gores: Otilio Miu Other Clinician: Referring Alliene Klugh: Otilio Miu Treating Ferdinand Revoir/Extender: STONE III, HOYT Weeks in Treatment: 72 Clinic Level of Care Assessment Items TOOL 4 Quantity Score '[]'$  - Use when only an EandM is performed on FOLLOW-UP visit 0 ASSESSMENTS - Nursing Assessment /  Reassessment '[]'$  - Reassessment of Co-morbidities (includes updates in patient status) 0 X - Reassessment of Adherence to Treatment Plan 1 5 ASSESSMENTS - Wound and Skin Assessment / Reassessment X - Simple Wound Assessment / Reassessment - one wound 1 5 '[]'$  - Complex Wound Assessment / Reassessment - multiple wounds 0 '[]'$  - Dermatologic / Skin Assessment (not related to wound area) 0 ASSESSMENTS - Focused Assessment '[]'$  - Circumferential Edema Measurements - multi extremities 0 '[]'$  - Nutritional Assessment / Counseling / Intervention 0 '[]'$  - Lower Extremity Assessment (monofilament, tuning fork, pulses) 0 '[]'$  - Peripheral Arterial Disease Assessment (using hand held doppler) 0 ASSESSMENTS - Ostomy and/or Continence Assessment and Care '[]'$  - Incontinence Assessment and Management 0 '[]'$  - Ostomy Care Assessment and Management (repouching, etc.) 0 PROCESS - Coordination of Care X - Simple Patient / Family Education for ongoing care 1 15 '[]'$  - Complex (extensive) Patient / Family Education for ongoing care 0 X - Staff obtains Programmer, systems, Records, Test Results / Process Orders 1 10 '[]'$  - Staff telephones HHA, Nursing Homes / Clarify orders / etc 0 '[]'$  - Routine Transfer to another Facility (non-emergent condition) 0 [name] ([id number]) '[]'$  - Routine Hospital Admission (non-emergent condition) 0 '[]'$  - New Admissions / Biomedical engineer / Ordering NPWT, Apligraf, etc. 0 '[]'$  - Emergency Hospital Admission (emergent condition) 0 X - Simple Discharge Coordination 1 10 '[]'$  - Complex (extensive) Discharge Coordination 0 PROCESS - Special Needs '[]'$  - Pediatric / Minor Patient Management 0 '[]'$  - Isolation Patient Management 0 '[]'$  - Hearing / Language / Visual special needs 0 '[]'$  - Assessment of Community assistance (transportation, D/C planning, etc.) 0 '[]'$  - Additional assistance / Altered mentation 0 '[]'$  - Support Surface(s) Assessment (bed, cushion, seat, etc.) 0 INTERVENTIONS - Wound Cleansing /  Measurement '[]'$  - Simple Wound Cleansing - one wound 0 X - Complex Wound Cleansing - multiple wounds 2 5 X - Wound  Imaging (photographs - any number of wounds) 1 5 '[]'$  - Wound Tracing (instead of photographs) 0 '[]'$  - Simple Wound Measurement - one wound 0 X - Complex Wound Measurement - multiple wounds 2 5 INTERVENTIONS - Wound Dressings '[]'$  - Small Wound Dressing one or multiple wounds 0 X - Medium Wound Dressing one or multiple wounds 2 15 '[]'$  - Large Wound Dressing one or multiple wounds 0 '[]'$  - Application of Medications - topical 0 '[]'$  - Application of Medications - injection 0 INTERVENTIONS - Miscellaneous '[]'$  - External ear exam 0 Faust, Amour C. ([id number]) '[]'$  - Specimen Collection (cultures, biopsies, blood, body fluids, etc.) 0 '[]'$  - Specimen(s) / Culture(s) sent or taken to Lab for analysis 0 '[]'$  - Patient Transfer (multiple staff / Harrel Lemon Lift / Similar devices) 0 '[]'$  - Simple Staple / Suture removal (25 or less) 0 '[]'$  - Complex Staple / Suture removal (26 or more) 0 '[]'$  - Hypo / Hyperglycemic Management (close monitor of Blood Glucose) 0 '[]'$  - Ankle / Brachial Index (ABI) - do not check if billed separately 0 X - Vital Signs 1 5 Has the patient been seen at the hospital within the last three years: Yes Total Score: 105 Level Of Care: New/Established - Level 3 Electronic Signature(s) Signed: 10/04/2016 5:55:48 PM By: Gretta Cool, BSN, RN, CWS, Kim RN, BSN Entered By: Gretta Cool, BSN, RN, CWS, Kim on 10/04/2016 17:07:01 [name] ([id number]) -------------------------------------------------------------------------------- Encounter Discharge Information Details Patient Name: [name], [name] Date of Service: 10/04/2016 3:00 PM Medical Record Number: [id number] Patient Account Number: [id number] Date of Birth/Sex: January 07, 1951 (66 y.o. Male) Treating RN: Cornell Barman Primary Care Kron Everton: Otilio Miu Other Clinician: Referring Kielan Dreisbach: Otilio Miu Treating Aisa Schoeppner/Extender: STONE III,  HOYT Weeks in Treatment: 38 Encounter Discharge Information Items Discharge Pain Level: 0 Discharge Condition: Stable Ambulatory Status: Walker Discharge Destination: Home Private Transportation: Auto Accompanied By: wife Schedule Follow-up Appointment: Yes Medication Reconciliation completed and Yes provided to Patient/Care Eyad Rochford: Patient Clinical Summary of Care: Declined Electronic Signature(s) Signed: 10/04/2016 5:07:58 PM By: Gretta Cool, BSN, RN, CWS, Kim RN, BSN Entered By: Gretta Cool, BSN, RN, CWS, Kim on 10/04/2016 17:07:58 Leeds, [name] ([id number]) -------------------------------------------------------------------------------- Lower Extremity Assessment Details Patient Name: [name], [name] Date of Service: 10/04/2016 3:00 PM Medical Record Number: [id number] Patient Account Number: [id number] Date of Birth/Sex: 1951-01-07 (66 y.o. Male) Treating RN: Cornell Barman Primary Care Oakley Orban: Otilio Miu Other Clinician: Referring Amaliya Whitelaw: Otilio Miu Treating Brittish Bolinger/Extender: Melburn Hake, HOYT Weeks in Treatment: 61 Electronic Signature(s) Signed: 10/04/2016 4:52:10 PM By: Gretta Cool, BSN, RN, CWS, Kim RN, BSN Entered By: Gretta Cool, BSN, RN, CWS, Kim on 10/04/2016 15:32:33 Hoe, [name] ([id number]) -------------------------------------------------------------------------------- Multi Wound Chart Details Patient Name: [name], [name] C. Date of Service: 10/04/2016 3:00 PM Medical Record Number: [id number] Patient Account Number: [id number] Date of Birth/Sex: June 23, 1950 (66 y.o. Male) Treating RN: Cornell Barman Primary Care Aleeyah Bensen: Otilio Miu Other Clinician: Referring Lexx Monte: Otilio Miu Treating Messi Twedt/Extender: STONE III, HOYT Weeks in Treatment: 36 Vital Signs Height(in): 70 Pulse(bpm): 86 Weight(lbs): 187 Blood Pressure 94/60 (mmHg): Body Mass Index(BMI): 27 Temperature(F): Respiratory Rate 16 (breaths/min): Photos: [1:No Photos] [4:No Photos] [5:No  Photos] Wound Location: [1:Right, Proximal Back] [4:Left Gluteus] [5:Right Gluteus] Wounding Event: [1:Pressure Injury] [4:Pressure Injury] [5:Pressure Injury] Primary Etiology: [1:Pressure Ulcer] [4:Pressure Ulcer] [5:Pressure Ulcer] Comorbid History: [1:N/A] [4:N/A] [5:Coronary Artery Disease, Type II Diabetes, Quadriplegia, Seizure Disorder] Date Acquired: [1:11/24/2015] [4:07/09/2016] [5:09/10/2016] Weeks of Treatment: [1:36] [4:7] [5:2] Wound Status: [1:Open] [4:Open] [5:Open] Measurements L x W x D 0.5x0.2x0.1 [4:1.2x1x0.1] [5:2.5x2.5x0.2] (  cm) Area (cm) : [1:0.079] [4:0.942] [5:4.909] Volume (cm) : [1:0.008] [4:0.094] [5:0.982] % Reduction in Area: [1:99.00%] [4:-500.00%] [5:13.20%] % Reduction in Volume: 99.00% [4:-487.50%] [5:-73.80%] Starting Position 1 [5:7] (o'clock): Ending Position 1 [5:10] (o'clock): Maximum Distance 1 [5:1] (cm): Undermining: [1:N/A] [4:N/A] [5:Yes] Classification: [1:Category/Stage II] [4:Category/Stage II] [5:Unstageable/Unclassified] Exudate Amount: [1:N/A] [4:N/A] [5:Large] Exudate Type: [1:N/A] [4:N/A] [5:Serosanguineous] Exudate Color: [1:N/A] [4:N/A] [5:red, brown] Foul Odor After [1:N/A] [4:N/A] [5:Yes] Cleansing: Cathy, Rhythm Loletha Grayer ([id number]) Odor Anticipated Due to N/A N/A No Product Use: Wound Margin: N/A N/A Flat and Intact Granulation Amount: N/A N/A None Present (0%) Necrotic Amount: N/A N/A Large (67-100%) Necrotic Tissue: N/A N/A Eschar, Adherent Slough Epithelialization: N/A N/A None Periwound Skin Texture: No Abnormalities Noted No Abnormalities Noted Excoriation: No Induration: No Callus: No Crepitus: No Rash: No Scarring: No Periwound Skin No Abnormalities Noted No Abnormalities Noted Maceration: Yes Moisture: Dry/Scaly: No Periwound Skin Color: No Abnormalities Noted No Abnormalities Noted Atrophie Blanche: No Cyanosis: No Ecchymosis: No Erythema: No Hemosiderin Staining: No Mottled: No Pallor: No Rubor:  No Temperature: N/A N/A No Abnormality Tenderness on No No Yes Palpation: Wound Preparation: N/A N/A Ulcer Cleansing: Rinsed/Irrigated with Saline Topical Anesthetic Applied: Other: lidocaine 4% Treatment Notes Electronic Signature(s) Signed: 10/04/2016 4:52:10 PM By: Gretta Cool, BSN, RN, CWS, Kim RN, BSN Entered By: Gretta Cool, BSN, RN, CWS, Kim on 10/04/2016 15:32:53 Harjo, [name] ([id number]) -------------------------------------------------------------------------------- Multi-Disciplinary Care Plan Details Patient Name: KHRYSTIAN, SCHAUF. Date of Service: 10/04/2016 3:00 PM Medical Record Number: [id number] Patient Account Number: [id number] Date of Birth/Sex: [birth year] (66 y.o. Male) Treating RN: Cornell Barman Primary Care Gohan Collister: Otilio Miu Other Clinician: Referring Solan Vosler: Otilio Miu Treating Berdena Cisek/Extender: STONE III, HOYT Weeks in Treatment: 4 Active Inactive ` Nutrition Nursing Diagnoses: Imbalanced nutrition Impaired glucose control: actual or potential Goals: Patient/caregiver agrees to and verbalizes understanding of need to use nutritional supplements and/or vitamins as prescribed Date Initiated: 01/24/2016 Target Resolution Date: 03/27/2016 Goal Status: Active Patient/caregiver will maintain therapeutic glucose control Date Initiated: 01/24/2016 Target Resolution Date: 03/27/2016 Goal Status: Active Interventions: Assess patient nutrition upon admission and as needed per policy Provide education on elevated blood sugars and impact on wound healing Notes: ` Orientation to the Wound Care Program Nursing Diagnoses: Knowledge deficit related to the wound healing center program Goals: Patient/caregiver will verbalize understanding of the [place] Date Initiated: 01/24/2016 Target Resolution Date: 03/27/2016 Goal Status: Active Interventions: Provide education on orientation to the wound center Notes: EARLIN, SWEEDEN  ([id number]) ` Pain, Acute or Chronic Nursing Diagnoses: Pain, acute or chronic: actual or potential Potential alteration in comfort, pain Goals: Patient will verbalize adequate pain control and receive pain control interventions during procedures as needed Date Initiated: 01/24/2016 Target Resolution Date: 03/27/2016 Goal Status: Active Patient/caregiver will verbalize adequate pain control between visits Date Initiated: 01/24/2016 Target Resolution Date: 03/27/2016 Goal Status: Active Patient/caregiver will verbalize comfort level met Date Initiated: 01/24/2016 Target Resolution Date: 03/27/2016 Goal Status: Active Interventions: Assess comfort goal upon admission Complete pain assessment as per visit requirements Notes: ` Pressure Nursing Diagnoses: Knowledge deficit related to management of pressures ulcers Potential for impaired tissue integrity related to pressure, friction, moisture, and shear Goals: Patient will remain free from development of additional pressure ulcers Date Initiated: 01/24/2016 Target Resolution Date: 03/27/2016 Goal Status: Active Interventions: Assess: immobility, friction, shearing, incontinence upon admission and as needed Assess offloading mechanisms upon admission and as needed Assess potential for pressure ulcer upon admission and as needed Provide education on pressure ulcers Notes: `  SHAVAR, GORKA ([id number]) Wound/Skin Impairment Nursing Diagnoses: Impaired tissue integrity Goals: Ulcer/skin breakdown will have a volume reduction of 30% by week 4 Date Initiated: 01/24/2016 Target Resolution Date: 03/27/2016 Goal Status: Active Ulcer/skin breakdown will have a volume reduction of 50% by week 8 Date Initiated: 01/24/2016 Target Resolution Date: 03/27/2016 Goal Status: Active Ulcer/skin breakdown will have a volume reduction of 80% by week 12 Date Initiated: 01/24/2016 Target Resolution Date: 03/27/2016 Goal Status:  Active Interventions: Assess patient/caregiver ability to perform ulcer/skin care regimen upon admission and as needed Assess ulceration(s) every visit Notes: Electronic Signature(s) Signed: 10/04/2016 4:52:10 PM By: Gretta Cool, BSN, RN, CWS, Kim RN, BSN Entered By: Gretta Cool, BSN, RN, CWS, Kim on 10/04/2016 15:32:44 [place], [name] ([id number]) -------------------------------------------------------------------------------- Pain Assessment Details Patient Name: TWAN, HARKIN C. Date of Service: 10/04/2016 3:00 PM Medical Record Number: [id number] Patient Account Number: [id number] Date of Birth/Sex: 01/01/1951 (66 y.o. Male) Treating RN: Cornell Barman Primary Care Vershawn Westrup: Otilio Miu Other Clinician: Referring Rosaline Ezekiel: Otilio Miu Treating Akin Yi/Extender: STONE III, HOYT Weeks in Treatment: 31 Active Problems Location of Pain Severity and Description of Pain Patient Has Paino No Site Locations With Dressing Change: No Pain Management and Medication Current Pain Management: Electronic Signature(s) Signed: 10/04/2016 4:52:10 PM By: Gretta Cool, BSN, RN, CWS, Kim RN, BSN Entered By: Gretta Cool, BSN, RN, CWS, Kim on 10/04/2016 15:13:16 Corro, [name] ([id number]) -------------------------------------------------------------------------------- Patient/Caregiver Education Details Patient Name: [name]. Date of Service: 10/04/2016 3:00 PM Medical Record Number: [id number] Patient Account Number: [id number] Date of Birth/Gender: March 29, 1950 (66 y.o. Male) Treating RN: Cornell Barman Primary Care Physician: Otilio Miu Other Clinician: Referring Physician: Otilio Miu Treating Physician/Extender: Melburn Hake, HOYT Weeks in Treatment: 36 Education Assessment Education Provided To: Patient Education Topics Provided Wound/Skin Impairment: Handouts: Caring for Your Ulcer, Other: continue wound care as prescribed and demonstrated Methods: Demonstration, Explain/Verbal Responses: State content  correctly Electronic Signature(s) Signed: 10/04/2016 5:55:48 PM By: Gretta Cool, BSN, RN, CWS, Kim RN, BSN Entered By: Gretta Cool, BSN, RN, CWS, Kim on 10/04/2016 17:08:42 [place], [name] ([id number]) -------------------------------------------------------------------------------- Wound Assessment Details Patient Name: KAILO, KOSIK C. Date of Service: 10/04/2016 3:00 PM Medical Record Number: [id number] Patient Account Number: [id number] Date of Birth/Sex: [birth year] (66 y.o. Male) Treating RN: Cornell Barman Primary Care Zilphia Kozinski: Otilio Miu Other Clinician: Referring Teaira Croft: Otilio Miu Treating Shaquilla Kehres/Extender: STONE III, HOYT Weeks in Treatment: 36 Wound Status Wound Number: 1 Primary Pressure Ulcer Etiology: Wound Location: Right Back - Proximal Wound Open Wounding Event: Pressure Injury Status: Date Acquired: 11/24/2015 Comorbid Coronary Artery Disease, Type II Weeks Of Treatment: 36 History: Diabetes, Quadriplegia, Seizure Clustered Wound: No Disorder Photos Wound Measurements Length: (cm) 0.5 Width: (cm) 0.2 Depth: (cm) 0.1 Area: (cm) 0.079 Volume: (cm) 0.008 % Reduction in Area: 99% % Reduction in Volume: 99% Epithelialization: None Wound Description Classification: Category/Stage II Foul Odor Aft Wound Margin: Distinct, outline attached Slough/Fibrin Exudate Amount: Medium Exudate Type: Serous Exudate Color: amber er Cleansing: No o Yes Wound Bed Granulation Amount: Medium (34-66%) Granulation Quality: Red Necrotic Amount: Medium (34-66%) Necrotic Quality: Adherent Slough Periwound Skin Texture Texture Color [name], [name] ([id number]) No Abnormalities Noted: No No Abnormalities Noted: No Moisture Temperature / Pain No Abnormalities Noted: No Temperature: No Abnormality Tenderness on Palpation: Yes Wound Preparation Ulcer Cleansing: Rinsed/Irrigated with Saline Topical Anesthetic Applied: Other: lidocaine 4%, Treatment Notes Wound #1 (Right,  Proximal Back) 1. Cleansed with: Clean wound with Normal Saline 2. Anesthetic Topical Lidocaine 4% cream to wound bed prior to debridement 3. Peri-wound Care: Skin Prep 4.  Dressing Applied: Prisma Ag 5. Secondary Dressing Applied Bordered Foam Dressing Electronic Signature(s) Signed: 10/04/2016 5:35:13 PM By: Elliot Gurney, BSN, RN, CWS, Kim RN, BSN Previous Signature: 10/04/2016 4:52:10 PM Version By: Elliot Gurney, BSN, RN, CWS, Kim RN, BSN Entered By: Elliot Gurney, BSN, RN, CWS, Kim on 10/04/2016 17:35:13 Banka, [name] ([id number]) -------------------------------------------------------------------------------- Wound Assessment Details Patient Name: [name], [name] Date of Service: 10/04/2016 3:00 PM Medical Record Number: [id number] Patient Account Number: [id number] Date of Birth/Sex: 01-Apr-1950 (66 y.o. Male) Treating RN: Huel Coventry Primary Care Ravis Herne: Elizabeth Sauer Other Clinician: Referring Irini Leet: Elizabeth Sauer Treating Jud Fanguy/Extender: STONE III, HOYT Weeks in Treatment: 36 Wound Status Wound Number: 4 Primary Pressure Ulcer Etiology: Wound Location: Left Gluteus Wound Open Wounding Event: Pressure Injury Status: Date Acquired: 07/09/2016 Comorbid Coronary Artery Disease, Type II Weeks Of Treatment: 7 History: Diabetes, Quadriplegia, Seizure Clustered Wound: No Disorder Photos Wound Measurements Length: (cm) 1.2 Width: (cm) 1 Depth: (cm) 0.1 Area: (cm) 0.942 Volume: (cm) 0.094 % Reduction in Area: -500% % Reduction in Volume: -487.5% Epithelialization: None Tunneling: No Undermining: No Wound Description Classification: Category/Stage II Wound Margin: Flat and Intact Exudate Amount: Large Exudate Type: Serous Exudate Color: amber Foul Odor After Cleansing: No Slough/Fibrino Yes Wound Bed Granulation Amount: None Present (0%) Exposed Structure Necrotic Amount: Large (67-100%) Fascia Exposed: No Necrotic Quality: Adherent Slough Fat Layer (Subcutaneous  Tissue) Exposed: Yes Tendon Exposed: No Muscle Exposed: No Joint Exposed: No Bone Exposed: No Ghan, Carle C. ([id number]) Periwound Skin Texture Texture Color No Abnormalities Noted: No No Abnormalities Noted: No Induration: Yes Ecchymosis: Yes Moisture Temperature / Pain No Abnormalities Noted: No Temperature: No Abnormality Maceration: Yes Tenderness on Palpation: Yes Wound Preparation Ulcer Cleansing: Rinsed/Irrigated with Saline Topical Anesthetic Applied: Other: lidocaine 4%, Treatment Notes Wound #4 (Left Gluteus) 1. Cleansed with: Clean wound with Normal Saline 2. Anesthetic Topical Lidocaine 4% cream to wound bed prior to debridement 3. Peri-wound Care: Skin Prep 4. Dressing Applied: Aquacel Ag 5. Secondary Dressing Applied Bordered Foam Dressing Electronic Signature(s) Signed: 10/04/2016 5:35:59 PM By: Elliot Gurney, BSN, RN, CWS, Kim RN, BSN Previous Signature: 10/04/2016 4:52:10 PM Version By: Elliot Gurney, BSN, RN, CWS, Kim RN, BSN Entered By: Elliot Gurney, BSN, RN, CWS, Kim on 10/04/2016 17:35:59 Montel, [name] ([id number]) -------------------------------------------------------------------------------- Wound Assessment Details Patient Name: Bourget, Dael C. Date of Service: 10/04/2016 3:00 PM Medical Record Number: [id number] Patient Account Number: [id number] Date of Birth/Sex: 08/25/1950 (66 y.o. Male) Treating RN: Huel Coventry Primary Care Haneefah Venturini: Elizabeth Sauer Other Clinician: Referring Chandrika Sandles: Elizabeth Sauer Treating [place] Frieden/Extender: STONE III, HOYT Weeks in Treatment: 36 Wound Status Wound Number: 5 Primary Pressure Ulcer Etiology: Wound Location: Right Gluteus Wound Open Wounding Event: Pressure Injury Status: Date Acquired: 09/10/2016 Comorbid Coronary Artery Disease, Type II Weeks Of Treatment: 2 History: Diabetes, Quadriplegia, Seizure Clustered Wound: No Disorder Photos Wound Measurements Length: (cm) 2.5 % Reduction in Width: (cm) 2.5 % Reduction  in Depth: (cm) 0.2 Epithelializati Area: (cm) 4.909 Undermining: Volume: (cm) 0.982 Starting Po Ending Posit Maximum Dist Area: 13.2% Volume: -73.8% on: None Yes sition (o'clock): 7 ion (o'clock): 10 ance: (cm) 1 Wound Description Classification: Unstageable/Unclassified Wound Margin: Flat and Intact Exudate Amount: Large Exudate Type: Serosanguineous Exudate Color: red, brown Foul Odor After Cleansing: Yes Due to Product Use: No Slough/Fibrino Yes Wound Bed Granulation Amount: None Present (0%) Exposed Structure Necrotic Amount: Large (67-100%) Fascia Exposed: No Necrotic Quality: Eschar, Adherent Slough Fat Layer (Subcutaneous Tissue) Exposed: Yes Tendon Exposed: No Diffley, Ashwin C. ([id number]) Muscle Exposed: No Joint Exposed: No Bone Exposed:  No Periwound Skin Texture Texture Color No Abnormalities Noted: No No Abnormalities Noted: No Callus: No Atrophie Blanche: No Crepitus: No Cyanosis: No Excoriation: No Ecchymosis: No Induration: No Erythema: No Rash: No Hemosiderin Staining: No Scarring: No Mottled: No Pallor: No Moisture Rubor: No No Abnormalities Noted: No Dry / Scaly: No Temperature / Pain Maceration: Yes Temperature: No Abnormality Tenderness on Palpation: Yes Wound Preparation Ulcer Cleansing: Rinsed/Irrigated with Saline Topical Anesthetic Applied: Other: lidocaine 4%, Treatment Notes Wound #5 (Right Gluteus) 1. Cleansed with: Clean wound with Normal Saline 2. Anesthetic Topical Lidocaine 4% cream to wound bed prior to debridement 3. Peri-wound Care: Skin Prep 4. Dressing Applied: Aquacel Ag 5. Secondary Dressing Applied Bordered Foam Dressing Electronic Signature(s) Signed: 10/04/2016 5:36:24 PM By: Gretta Cool, BSN, RN, CWS, Kim RN, BSN Previous Signature: 10/04/2016 4:52:10 PM Version By: Gretta Cool, BSN, RN, CWS, Kim RN, BSN Entered By: Gretta Cool, BSN, RN, CWS, Kim on 10/04/2016 17:36:24 Attar, [name]  ([id number]) -------------------------------------------------------------------------------- Vitals Details Patient Name: BRAXEN, DOBEK C. Date of Service: 10/04/2016 3:00 PM Medical Record Number: [id number] Patient Account Number: [id number] Date of Birth/Sex: 10-04-1950 (66 y.o. Male) Treating RN: Cornell Barman Primary Care Elio Haden: Otilio Miu Other Clinician: Referring Shaletta Hinostroza: Otilio Miu Treating Kathrina Crosley/Extender: STONE III, HOYT Weeks in Treatment: 50 Vital Signs Time Taken: 15:13 Pulse (bpm): 86 Height (in): 70 Respiratory Rate (breaths/min): 16 Weight (lbs): 187 Blood Pressure (mmHg): 94/60 Body Mass Index (BMI): 26.8 Reference Range: 80 - 120 mg / dl Electronic Signature(s) Signed: 10/04/2016 4:52:10 PM By: Gretta Cool, BSN, RN, CWS, Kim RN, BSN Entered By: Gretta Cool, BSN, RN, CWS, Kim on 10/04/2016 15:15:28

## 2016-10-06 NOTE — Progress Notes (Signed)
JOSSUE, RUBENSTEIN ([id number]) Visit Report for 10/04/2016 Chief Complaint Document Details Patient Name: [name], [name]. Date of Service: 10/04/2016 3:00 PM Medical Record Number: [id number] Patient Account Number: [id number] Date of Birth/Sex: March 09, 1950 (66 y.o. Male) Treating RN: Huel Coventry Primary Care Provider: Elizabeth Sauer Other Clinician: Referring Provider: Elizabeth Sauer Treating Provider/Extender: Linwood Dibbles, Arsenia Goracke Weeks in Treatment: 12 Information Obtained from: Patient Chief Complaint 01/24/16 patient arrives today for review of a pressure ulcer on his right scapula in the setting of incomplete C3-C4 quadriplegia Electronic Signature(s) Signed: 10/05/2016 10:18:42 AM By: Lenda Kelp PA-C Entered By: Lenda Kelp on 10/04/2016 15:40:34 Nault, [name] ([id number]) -------------------------------------------------------------------------------- HPI Details Patient Name: [name] Bras C. Date of Service: 10/04/2016 3:00 PM Medical Record Number: [id number] Patient Account Number: [id number] Date of Birth/Sex: 27-Nov-1950 (66 y.o. 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 He has lower extremity sensation and can move his legs but has no/limited control over his arms. His wife accompanies him today and states that in the late spring or early summer of 2017 the patient became very depressed. He refused the refused to mobilize and he developed several pressure ulcers on his back. Most of these have healed however they have a recalcitrant area over the right scapula. They've been using Santyl on this for at least the last month. In terms of depression  the patient is doing better now on an antidepressant. He saw his primary physician on 01/12/16 at which time there was apparently green drainage coming out of this area [Dr. Deana Jones]. Dr. Yetta Barre works in the [facility]. He has completed this Septra. Otherwise looking through [facility] notes that he has a history of seizures. He also had a stroke in 2008 he follows with neurology. He also has type 2 diabetes on Glucophage, hyperlipidemia and gastroesophageal reflux. He takes Plavix for stroke prevention and Keppra for seizure prophylaxis. 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 02/08/16; this is a patient with a pressure ulcer over the right scapula. His wife has been doing the dressing with Santyl and border foam change every second day. When he arrived here 2 weeks ago we did a fairly extensive mechanical debridement. We are asked medical modalities to go out to the home and see what they might be eligible for in terms of pressure-relief surfaces [level 2] but the wife states that they have not heard from them. 03/20/15; this is a patient we haven't seen in 5-6 weeks. This was largely due to transportation issues. They've been using Santyl and border foam changing every second day for a pressure injury over the right scapula. The patient has a C3-C4 spinal injury. We had also asked for a review by the people who supplied DME to look at his wheelchair cushion, mattress etc. I don't know that this ever happened. In the meantime the wound has done remarkably well current measurements 1.8 x 2 x 0.1 04/03/16; the patient's dimensions have gone up to 3 cm in diameter quite a deterioration from last time. He is also complaining of pain in this area which is new. 04/10/16; 3 x 1.5  x 0.1. No difference from last week. Culture I did last week was negative he has completed antibiotics. 04/24/16 2.4 x 2 x  0.1; wound generally looks smaller. Middle area that I had to debrided last time looks healthier. His son is fashioned a large piece of foam cut out where the patient's wound with hit the back of his wheelchair 05/01/16; wound is a same size however the surface of this looks better. The middle innkeeper area required a repeat debridement 05/15/16; wound is down and dimensions granulation still looks healthy. The medial aspect of this wound is now the deeper Divot. We'll see how this responds to further healing. We're using Hydrofera Blue 06/05/16; Wound 1.7x1.1x0.1 still using hyudrofera blue 06/26/16; the patient arrives back in clinic after a three-week hiatus. He was hospitalized from 06/09/16 through 06/10/16. He was found to be confused. CT scan of the head showed nothing really acute. His sodium was [name], [name] ([id number]) low at 131. He was rehydrated. He was felt to have a UTI and given antibiotics and antibiotics at discharge although his final culture result only showed multiple organisms. His wife says today that at the time of the hospitalization they discovered a large intact blister over the large aspect of his left calcaneus. This is recently ruptured. The wound on his right scapula is somewhat larger. More his wife is concerned about his current status. She states that he is still confused sleeps for long periods. He is not having fever chills cough or diarrhea [1 loose bowel movement per day]. He continues to have a suprapubic catheter. She notes that he is not eating and drinking well. Patient states he just does not want to eat. He does not feel nauseated or vomit. 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 Admission sodium was 131 at discharge 139 BUN 22 and 1.22 at admission, 17 and 1 at discharge. 07/03/16; patient's mental status is back to normal. He has a new wound on the right elbow  caused by traumatizing his elbow against the wall apparently at the dermatologist office last week. We continue with the original wound on the right scapula and the wound from 2 weeks ago on his left heel. We have been using silver alginate to the area on the heel. 08/14/16; patient has not been here in almost 6 weeks. When he was here last time he had the pressure ulcer on the right scapula, atraumatic wound on the right lateral elbow and an area on his left heel. His wife states that at one point all of these were healed and she didn't really feel he needed to come back here. Since then the patient has developed a reopening of the area on the right scapula, a stage II wound on the left buttock and a reopening of the area on the right lateral elbow. As usual his wife as a litany of complaints against home health, she is dismissing or is going to dismiss well care. She tells [place] she has a long list of supplies at home already for some reason they were not felt to be eligible for a group 2 or 3 surface although they have a hospital bed at home but no offloading surface 08/28/16; the patient arrived today unfortunately incontinent of stool in spite of this the wounds all appear to be better including the right scapular area, his left buttock's left heel and the right lateral elbow. 09/18/16;  this is a patient who arrived today for follow-up of a pressure area over his right scapula area, left buttock and there was an area on his right lateral elbow. He arrived in clinic today with a worrisome area over the right initial tuberosity/right buttock. This had a necrotic surface with draining purulence. He has not been systemically unwell. The area over the left elbow healed. He arrived in clinic today with dressing that hadn't been changed over the scapula for 2 or 3 days per her intake. His wife is previously fired home health 09/25/16;; the last time the patient was here he arrived with a new grossly  infected wound over the right ischial tuberosity. Culture I did not show a specific pathogen. I did think this required admission to hospital and he was admitted from 09/18/16 through 09/20/16. Initially given bank and Zosyn but then discharged with 5 days' worth of Augmentin. An MRI was done that did not show osteomyelitis of the right ischial tuberosity however it did show cellulitis and possible myositis. He also has wounds on the left ischial tuberosity and the area over the right scapular area. 10/04/16 on evaluation today patient appears to be doing better in regard to his back wound and is stable in regard to the gluteal wounds. There does not appear to be any evidence of significant infection at this point. He is tolerating the dressing changes currently. His wife has been performing the dressing changes. Nonetheless he does have some discomfort especially in the gluteal areas although he did not specifically rate the discomfort there were times during evaluation where he flinched and it was obvious it was bothering him. No fevers, chills, nausea, or vomiting noted at this time. Electronic Signature(s) Signed: 10/05/2016 10:18:42 AM By: Lenda Kelp PA-C Entered By: Lenda Kelp on 10/04/2016 17:05:08 Behanna, ABBAS BEYENE ([id number]) [address] CHIDERA THIVIERGE ([id number]) -------------------------------------------------------------------------------- Physical Exam Details Patient Name: VANNA, SHAVERS C. Date of Service: 10/04/2016 3:00 PM Medical Record Number: [id number] Patient Account Number: [id number] Date of Birth/Sex: [birth year] (66 y.o. Male) Treating RN: Huel Coventry Primary Care Provider: Elizabeth Sauer Other Clinician: Referring Provider: Elizabeth Sauer Treating Provider/Extender: STONE III, Romyn Boswell Weeks in Treatment: 28 Constitutional Well-nourished and well-hydrated in no acute distress. Respiratory normal breathing without difficulty. clear to auscultation  bilaterally. Cardiovascular regular rate and rhythm with normal S1, S2. Psychiatric this patient is able to make decisions and demonstrates good insight into disease process. Alert and Oriented x 3. pleasant and cooperative. Notes There was no erythema surrounding the wound especially in the gluteal region at this point. His back wound has significantly improved in size. No debridement performed today. Electronic Signature(s) Signed: 10/05/2016 10:18:42 AM By: Lenda Kelp PA-C Entered By: Lenda Kelp on 10/04/2016 17:05:53 Rayburn, [name] ([id number]) -------------------------------------------------------------------------------- Physician Orders Details Patient Name: VAHE, PIENTA C. Date of Service: 10/04/2016 3:00 PM Medical Record Number: [id number] Patient Account Number: [id number] Date of Birth/Sex: 08-09-1950 (66 y.o. Male) Treating RN: Huel Coventry Primary Care Provider: Elizabeth Sauer Other Clinician: Referring Provider: Elizabeth Sauer Treating Provider/Extender: Linwood Dibbles, Keandre Linden Weeks in Treatment: 47 Verbal / Phone Orders: No Diagnosis Coding ICD-10 Coding Code Description [id number] Pressure ulcer of other site, stage 3 S14.103S Unspecified injury at C3 level of cervical spinal cord, sequela S51.011A Laceration without foreign body of right elbow, initial encounter L89.322 Pressure ulcer of left buttock, stage 2 L89.310 Pressure ulcer of right buttock, unstageable L03.317 Cellulitis of buttock Wound Cleansing Wound #1 Right,Proximal Back o Cleanse wound with mild soap  and water Wound #4 Left Gluteus o Cleanse wound with mild soap and water Wound #5 Right Gluteus o Cleanse wound with mild soap and water Anesthetic Wound #1 Right,Proximal Back o Topical Lidocaine 4% cream applied to wound bed prior to debridement Wound #4 Left Gluteus o Topical Lidocaine 4% cream applied to wound bed prior to debridement Wound #5 Right Gluteus o Topical Lidocaine  4% cream applied to wound bed prior to debridement Skin Barriers/Peri-Wound Care Wound #1 Right,Proximal Back o Skin Prep Wound #4 Left Gluteus o Skin Prep Elwood, Codie C. ([id number]) Wound #5 Right Gluteus o Skin Prep Primary Wound Dressing Wound #1 Right,Proximal Back o Aquacel Ag Wound #4 Left Gluteus o Aquacel Ag Wound #5 Right Gluteus o Aquacel Ag Secondary Dressing Wound #1 Right,Proximal Back o Boardered Foam Dressing Wound #4 Left Gluteus o Boardered Foam Dressing Wound #5 Right Gluteus o Boardered Foam Dressing Dressing Change Frequency Wound #1 Right,Proximal Back o Change dressing every other day. Wound #4 Left Gluteus o Change dressing every other day. Wound #5 Right Gluteus o Change dressing every other day. Follow-up Appointments Wound #1 Right,Proximal Back o Return Appointment in 1 week. Wound #4 Left Gluteus o Return Appointment in 1 week. Wound #5 Right Gluteus o Return Appointment in 1 week. Off-Loading Wound #1 Right,Proximal Back o Roho cushion for wheelchair TALAL, FRITCHMAN ([id number]) o Turn and reposition every 2 hours Wound #4 Left Gluteus o Roho cushion for wheelchair o Turn and reposition every 2 hours Wound #5 Right Gluteus o Roho cushion for wheelchair o Turn and reposition every 2 hours Additional Orders / Instructions Wound #1 Right,Proximal Back o Increase protein intake. Wound #4 Left Gluteus o Increase protein intake. Wound #5 Right Gluteus o Increase protein intake. Notes We will change the dressing for the back wound to a collagen dressing and will certainly continue with the current wound care orders per above for the gluteal wounds. We will see him for reevaluation in one week to see were things stand. If anything worsened significantly patient's wife will contact our office for additional recommendations. Electronic Signature(s) Signed: 10/05/2016 10:18:42 AM By:  Lenda Kelp PA-C Previous Signature: 10/04/2016 4:52:10 PM Version By: Elliot Gurney, BSN, RN, CWS, Kim RN, BSN Entered By: Lenda Kelp on 10/04/2016 17:06:47 Shock, [name] ([id number]) -------------------------------------------------------------------------------- Problem List Details Patient Name: SILVESTRE, MINES C. Date of Service: 10/04/2016 3:00 PM Medical Record Number: [id number] Patient Account Number: [id number] Date of Birth/Sex: 1950-05-22 (66 y.o. Male) Treating RN: Huel Coventry Primary Care Provider: Elizabeth Sauer Other Clinician: Referring Provider: Elizabeth Sauer Treating Provider/Extender: Linwood Dibbles, Fayola Meckes Weeks in Treatment: 15 Active Problems ICD-10 Encounter Code Description Active Date Diagnosis 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, stage 3 01/24/2016 Yes S14.103S Unspecified injury at C3 level of cervical spinal cord, 01/24/2016 Yes sequela S51.011A Laceration without foreign body of right elbow, initial 07/03/2016 Yes encounter L89.322 Pressure ulcer of left buttock, stage 2 08/14/2016 Yes L89.310 Pressure ulcer of right buttock, unstageable 09/18/2016 Yes L03.317 Cellulitis of buttock 09/18/2016 Yes Inactive Problems Resolved Problems ICD-10 Code Description Active Date Resolved Date L89.622 Pressure ulcer of left heel, stage 2 06/26/2016 06/26/2016 Electronic Signature(s) Signed: 10/05/2016 10:18:42 AM By: Lenda Kelp PA-C Entered By: Lenda Kelp on 10/04/2016 17:03:45 Castile, [name] ([id number]) Skorupski, [name] ([id number]) -------------------------------------------------------------------------------- Progress Note Details Patient Name: [name] Bras C. Date of Service: 10/04/2016 3:00 PM Medical Record Number: [id number] Patient Account Number: [id number] Date of Birth/Sex: 1950/09/23 (66 y.o. 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 He has lower extremity sensation and can move his legs but has no/limited control over his arms. His wife accompanies him today and states that in the late spring or early summer of 2017 the patient became very depressed. He refused the refused to mobilize and he developed several pressure ulcers on his back. Most of these have healed however they have a recalcitrant area over the right scapula. They've been using Santyl on this for at least the last month. In terms of depression the patient is doing better now on an antidepressant. He saw his primary physician on 01/12/16 at which time there was apparently green drainage coming out of this area [Dr. Deana Jones]. Dr. Yetta Barre works in the [facility]. He has completed this Septra. Otherwise looking through [facility] notes that he has a history of seizures. He also had a stroke in 2008 he follows with neurology. He also has type 2 diabetes on Glucophage, hyperlipidemia and gastroesophageal reflux. He takes Plavix for stroke prevention and Keppra for seizure prophylaxis. 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 02/08/16; this is a patient with a pressure ulcer over the right scapula. His wife has been doing the dressing with Santyl and border foam change every second day. When he arrived here 2 weeks ago we did a fairly extensive mechanical debridement. We are asked medical modalities to go out to the home and see what they might be eligible for in terms of  pressure-relief surfaces [level 2] but the wife states that they have not heard from them. 03/20/15; this is a patient we haven't seen in 5-6 weeks. This was largely due to transportation issues. They've been using Santyl and border foam changing every second day for a pressure injury over the right scapula. The patient has a C3-C4 spinal injury. We had also asked for a review by the people who supplied DME to look at his wheelchair cushion, mattress etc. I don't know that this ever happened. In the meantime the wound has done remarkably well current measurements 1.8 x 2 x 0.1 04/03/16; the patient's dimensions have gone up to 3 cm in diameter quite a deterioration from last time. He is also complaining of pain in this area which is new. 04/10/16; 3 x 1.5 x 0.1. No difference from last week. Culture I did last week was negative he has completed antibiotics. 04/24/16 2.4 x 2 x 0.1; wound generally looks smaller. Middle area that I had to debrided last time looks healthier. His son is fashioned a large piece of foam cut out where the patient's wound with hit the back of his wheelchair 05/01/16; wound is a same size however the surface of this looks better. The middle innkeeper area required [facility], MATTEO BANKE. ([id number]) a repeat debridement 05/15/16; wound is down and dimensions granulation still looks healthy. The medial aspect of this wound is now the deeper Divot. We'll see how this responds to further healing. We're using  Hydrofera Blue 06/05/16; Wound 1.7x1.1x0.1 still using hyudrofera blue 06/26/16; the patient arrives back in clinic after a three-week hiatus. He was hospitalized from 06/09/16 through 06/10/16. He was found to be confused. CT scan of the head showed nothing really acute. His sodium was low at 131. He was rehydrated. He was felt to have a UTI and given antibiotics and antibiotics at discharge although his final culture result only showed multiple organisms. His wife says today that at  the time of the hospitalization they discovered a large intact blister over the large aspect of his left calcaneus. This is recently ruptured. The wound on his right scapula is somewhat larger. More his wife is concerned about his current status. She states that he is still confused sleeps for long periods. He is not having fever chills cough or diarrhea [1 loose bowel movement per day]. He continues to have a suprapubic catheter. She notes that he is not eating and drinking well. Patient states he just does not want to eat. He does not feel nauseated or vomit. 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 Admission sodium was 131 at discharge 139 BUN 22 and 1.22 at admission, 17 and 1 at discharge. 07/03/16; patient's mental status is back to normal. He has a new wound on the right elbow caused by traumatizing his elbow against the wall apparently at the dermatologist office last week. We continue with the original wound on the right scapula and the wound from 2 weeks ago on his left heel. We have been using silver alginate to the area on the heel. 08/14/16; patient has not been here in almost 6 weeks. When he was here last time he had the pressure ulcer on the right scapula, atraumatic wound on the right lateral elbow and an area on his left heel. His wife states that at one point all of these were healed and she didn't really feel he needed to come back here. Since then the patient has developed a reopening of the area on the right scapula, a stage II wound on the left buttock and a reopening of the area on the right lateral elbow. As usual his wife as a litany of complaints against home health, she is dismissing or is going to dismiss well care. She tells us she has a long list of supplies at home already for some reason they were not felt to be eligible for a group 2 or 3 surface although they have a hospital  bed at home but no offloading surface 08/28/16; the patient arrived today unfortunately incontinent of stool in spite of this the wounds all appear to be better including the right scapular area, his left buttock's left heel and the right lateral elbow. 09/18/16; this is a patient who arrived today for follow-up of a pressure area over his right scapula area, left buttock and there was an area on his right lateral elbow. He arrived in clinic today with a worrisome area over the right initial tuberosity/right buttock. This had a necrotic surface with draining purulence. He has not been systemically unwell. The area over the left elbow healed. He arrived in clinic today with dressing that hadn't been changed over the scapula for 2 or 3 days per her intake. His wife is previously fired home health 09/25/16;; the last time the patient was here he arrived with a new grossly infected wound over the right ischial tuberosity. Culture  I did not show a specific pathogen. I did think this required admission to hospital and he was admitted from 09/18/16 through 09/20/16. Initially given bank and Zosyn but then discharged with 5 days' worth of Augmentin. An MRI was done that did not show osteomyelitis of the right ischial tuberosity however it did show cellulitis and possible myositis. He also has wounds on the left ischial tuberosity and the area over the right scapular area. 10/04/16 on evaluation today patient appears to be doing better in regard to his back wound and is stable in regard to the gluteal wounds. There does not appear to be any evidence of significant infection at this point. He is tolerating the dressing changes currently. His wife has been performing the dressing changes. Nonetheless he does have some discomfort especially in the gluteal areas although he did not specifically Espiritu, Cadin C. ([id number]) rate the discomfort there were times during evaluation where he flinched and it was obvious it  was bothering him. No fevers, chills, nausea, or vomiting noted at this time. Objective Constitutional Well-nourished and well-hydrated in no acute distress. Vitals Time Taken: 3:13 PM, Height: 70 in, Weight: 187 lbs, BMI: 26.8, Pulse: 86 bpm, Respiratory Rate: 16 breaths/min, Blood Pressure: 94/60 mmHg. Respiratory normal breathing without difficulty. clear to auscultation bilaterally. Cardiovascular regular rate and rhythm with normal S1, S2. Psychiatric this patient is able to make decisions and demonstrates good insight into disease process. Alert and Oriented x 3. pleasant and cooperative. General Notes: There was no erythema surrounding the wound especially in the gluteal region at this point. His back wound has significantly improved in size. No debridement performed today. Integumentary (Hair, Skin) Wound #1 status is Open. Original cause of wound was Pressure Injury. The wound is located on the Right,Proximal Back. The wound measures 0.5cm length x 0.2cm width x 0.1cm depth; 0.079cm^2 area and 0.008cm^3 volume. Wound #4 status is Open. Original cause of wound was Pressure Injury. The wound is located on the Left Gluteus. The wound measures 1.2cm length x 1cm width x 0.1cm depth; 0.942cm^2 area and 0.094cm^3 volume. Wound #5 status is Open. Original cause of wound was Pressure Injury. The wound is located on the Right Gluteus. The wound measures 2.5cm length x 2.5cm width x 0.2cm depth; 4.909cm^2 area and 0.982cm^3 volume. There is Fat Layer (Subcutaneous Tissue) Exposed exposed. There is undermining starting at 7:00 and ending at 10:00 with a maximum distance of 1cm. There is a large amount of serosanguineous drainage noted. The wound margin is flat and intact. There is no granulation within the wound bed. There is a large (67-100%) amount of necrotic tissue within the wound bed including Eschar and Adherent Slough. The periwound skin appearance exhibited: Maceration. The  periwound skin appearance did not exhibit: Councilman, Vidit C. ([id number]) Callus, Crepitus, Excoriation, Induration, Rash, Scarring, Dry/Scaly, Atrophie Blanche, Cyanosis, Ecchymosis, Hemosiderin Staining, Mottled, Pallor, Rubor, Erythema. Periwound temperature was noted as No Abnormality. The periwound has tenderness on palpation. 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, stage 3 S14.103S - Unspecified injury at C3 level of cervical spinal cord, sequela S51.011A - Laceration without foreign body of right elbow, initial encounter L89.322 - Pressure ulcer of left buttock, stage 2 L89.310 - Pressure ulcer of right buttock, unstageable L03.317 - Cellulitis of buttock Plan Wound Cleansing: Wound #1 Right,Proximal Back: Cleanse wound with mild soap and water Wound #4 Left Gluteus: Cleanse wound with mild soap and water Wound #5 Right Gluteus: Cleanse wound with mild soap  and water Anesthetic: Wound #1 Right,Proximal Back: Topical Lidocaine 4% cream applied to wound bed prior to debridement Wound #4 Left Gluteus: Topical Lidocaine 4% cream applied to wound bed prior to debridement Wound #5 Right Gluteus: Topical Lidocaine 4% cream applied to wound bed prior to debridement Skin Barriers/Peri-Wound Care: Wound #1 Right,Proximal Back: Skin Prep Wound #4 Left Gluteus: Skin Prep Wound #5 Right Gluteus: Skin Prep Primary Wound Dressing: Wound #1 Right,Proximal Back: Aquacel Ag Ditommaso, Malone C. ([id number]) Wound #4 Left Gluteus: Aquacel Ag Wound #5 Right Gluteus: Aquacel Ag Secondary Dressing: Wound #1 Right,Proximal Back: Boardered Foam Dressing Wound #4 Left Gluteus: Boardered Foam Dressing Wound #5 Right Gluteus: Boardered Foam Dressing Dressing Change Frequency: Wound #1 Right,Proximal Back: Change dressing every other day. Wound #4 Left Gluteus: Change dressing every other day. Wound #5 Right Gluteus: Change dressing every other  day. Follow-up Appointments: Wound #1 Right,Proximal Back: Return Appointment in 1 week. Wound #4 Left Gluteus: Return Appointment in 1 week. Wound #5 Right Gluteus: Return Appointment in 1 week. Off-Loading: Wound #1 Right,Proximal Back: Roho cushion for wheelchair Turn and reposition every 2 hours Wound #4 Left Gluteus: Roho cushion for wheelchair Turn and reposition every 2 hours Wound #5 Right Gluteus: Roho cushion for wheelchair Turn and reposition every 2 hours Additional Orders / Instructions: Wound #1 Right,Proximal Back: Increase protein intake. Wound #4 Left Gluteus: Increase protein intake. Wound #5 Right Gluteus: Increase protein intake. General Notes: We will change the dressing for the back wound to a collagen dressing and will certainly continue with the current wound care orders per above for the gluteal wounds. We will see him for reevaluation in one week to see were things stand. If anything worsened significantly patient's wife will contact our office for additional recommendations. MAVERICK, DIEUDONNE ([id number]) Electronic Signature(s) Signed: 10/05/2016 10:18:42 AM By: Lenda Kelp PA-C Entered By: Lenda Kelp on 10/04/2016 17:06:55 Oliver, [name] ([id number]) -------------------------------------------------------------------------------- SuperBill Details Patient Name: [name] Bras C. Date of Service: 10/04/2016 Medical Record Number: [id number] Patient Account Number: [id number] Date of Birth/Sex: [birth year] (66 y.o. Male) Treating RN: Huel Coventry Primary Care Provider: Elizabeth Sauer Other Clinician: Referring Provider: Elizabeth Sauer Treating Provider/Extender: STONE III, Liberti Appleton Weeks in Treatment: 36 Diagnosis Coding ICD-10 Codes Code Description [phone] Pressure ulcer of other site, stage 3 S14.103S Unspecified injury at C3 level of cervical spinal cord, sequela S51.011A Laceration without foreign body of right elbow, initial  encounter L89.322 Pressure ulcer of left buttock, stage 2 L89.310 Pressure ulcer of right buttock, unstageable L03.317 Cellulitis of buttock Facility Procedures CPT4 Code: [id number] Description: 99213 - WOUND CARE VISIT-LEV 3 EST PT Modifier: Quantity: 1 Physician Procedures CPT4 Code Description: [id number] - WC PHYS LEVEL 3 - EST PT ICD-10 Description Diagnosis 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

## 2016-10-15 ENCOUNTER — Encounter: Payer: Medicare Other | Admitting: Surgery

## 2016-10-15 DIAGNOSIS — K219 Gastro-esophageal reflux disease without esophagitis: Secondary | ICD-10-CM | POA: Diagnosis not present

## 2016-10-15 DIAGNOSIS — E785 Hyperlipidemia, unspecified: Secondary | ICD-10-CM | POA: Diagnosis not present

## 2016-10-15 DIAGNOSIS — I959 Hypotension, unspecified: Secondary | ICD-10-CM | POA: Diagnosis not present

## 2016-10-15 DIAGNOSIS — G8252 Quadriplegia, C1-C4 incomplete: Secondary | ICD-10-CM | POA: Diagnosis not present

## 2016-10-15 DIAGNOSIS — L89310 Pressure ulcer of right buttock, unstageable: Secondary | ICD-10-CM | POA: Diagnosis not present

## 2016-10-15 DIAGNOSIS — S51011A Laceration without foreign body of right elbow, initial encounter: Secondary | ICD-10-CM | POA: Diagnosis not present

## 2016-10-15 DIAGNOSIS — E119 Type 2 diabetes mellitus without complications: Secondary | ICD-10-CM | POA: Diagnosis not present

## 2016-10-15 DIAGNOSIS — L03317 Cellulitis of buttock: Secondary | ICD-10-CM | POA: Diagnosis not present

## 2016-10-15 DIAGNOSIS — S14103S Unspecified injury at C3 level of cervical spinal cord, sequela: Secondary | ICD-10-CM | POA: Diagnosis not present

## 2016-10-15 DIAGNOSIS — L89322 Pressure ulcer of left buttock, stage 2: Secondary | ICD-10-CM | POA: Diagnosis not present

## 2016-10-15 DIAGNOSIS — L89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, stage 3: Secondary | ICD-10-CM | POA: Diagnosis not present

## 2016-10-15 DIAGNOSIS — Z8673 Personal history of transient ischemic attack (TIA), and cerebral infarction without residual deficits: Secondary | ICD-10-CM | POA: Diagnosis not present

## 2016-10-15 DIAGNOSIS — Z7984 Long term (current) use of oral hypoglycemic drugs: Secondary | ICD-10-CM | POA: Diagnosis not present

## 2016-10-15 DIAGNOSIS — R569 Unspecified convulsions: Secondary | ICD-10-CM | POA: Diagnosis not present

## 2016-10-16 NOTE — Progress Notes (Signed)
PIKE, [place] ([id number]) Visit Report for 10/15/2016 Chief Complaint Document Details Patient Name: [name], [name]. Date of Service: 10/15/2016 3:00 PM Medical Record Number: [id number] Patient Account Number: [id number] Date of Birth/Sex: [birth year] (66 y.o. Male) Treating RN: Huel Coventry Primary Care Provider: Elizabeth Sauer Other Clinician: Referring Provider: Elizabeth Sauer Treating Provider/Extender: Rudene Re in Treatment: 37 Information Obtained from: Patient Chief Complaint 01/24/16 patient arrives today for review of a pressure ulcer on his right scapula in the setting of incomplete C3-C4 quadriplegia Electronic Signature(s) Signed: 10/15/2016 4:20:11 PM By: Evlyn Kanner MD, FACS Entered By: Evlyn Kanner on 10/15/2016 15:42:07 Haser, [name] ([id number]) -------------------------------------------------------------------------------- Debridement Details Patient Name: [name]. Date of Service: 10/15/2016 3:00 PM Medical Record Number: [id number] Patient Account Number: [id number] Date of Birth/Sex: 1951-01-12 (66 y.o. Male) Treating RN: Huel Coventry Primary Care Provider: Elizabeth Sauer Other Clinician: Referring Provider: Elizabeth Sauer Treating Provider/Extender: Rudene Re in Treatment: 37 Debridement Performed for Wound #4 Left Gluteus Assessment: Performed By: Physician Evlyn Kanner, MD Debridement: Debridement Pre-procedure Verification/Time Out Yes - 15:31 Taken: Start Time: 15:31 Pain Control: Other : lidocaine 4% Level: Skin/Subcutaneous Tissue Total Area Debrided (L x 0.9 (cm) x 1.1 (cm) = 0.99 (cm) W): Tissue and other Viable, Non-Viable, Subcutaneous material debrided: Instrument: Curette Bleeding: Minimum Hemostasis Achieved: Pressure End Time: 15:32 Procedural Pain: 1 Post Procedural Pain: 0 Response to Treatment: Procedure was tolerated well Post Debridement Measurements of Total Wound Length: (cm)  0.9 Stage: Category/Stage II Width: (cm) 1.1 Depth: (cm) 0.1 Volume: (cm) 0.078 Character of Wound/Ulcer Post Improved Debridement: Post Procedure Diagnosis Same as Pre-procedure Electronic Signature(s) Signed: 10/15/2016 4:20:11 PM By: Evlyn Kanner MD, FACS Signed: 10/15/2016 4:59:52 PM By: Elliot Gurney, BSN, RN, CWS, Kim RN, BSN Entered By: Evlyn Kanner on 10/15/2016 15:41:33 Reddington, [name] ([id number]) -------------------------------------------------------------------------------- Debridement Details Patient Name: [name]. Date of Service: 10/15/2016 3:00 PM Medical Record Number: [id number] Patient Account Number: [id number] Date of Birth/Sex: December 08, 1950 (66 y.o. Male) Treating RN: Huel Coventry Primary Care Provider: Elizabeth Sauer Other Clinician: Referring Provider: Elizabeth Sauer Treating Provider/Extender: Rudene Re in Treatment: 37 Debridement Performed for Wound #5 Right Gluteus Assessment: Performed By: Physician Evlyn Kanner, MD Debridement: Debridement Pre-procedure Verification/Time Out Yes - 15:31 Taken: Start Time: 15:31 Pain Control: Other : lidocaine 4% Level: Skin/Subcutaneous Tissue Total Area Debrided (L x 2.9 (cm) x 2.5 (cm) = 7.25 (cm) W): Tissue and other Viable, Non-Viable, Subcutaneous material debrided: Instrument: Curette Bleeding: Minimum Hemostasis Achieved: Pressure End Time: 15:32 Procedural Pain: 1 Post Procedural Pain: 0 Response to Treatment: Procedure was tolerated well Post Debridement Measurements of Total Wound Length: (cm) 2.9 Stage: Unstageable/Unclassified Width: (cm) 2.5 Depth: (cm) 0.3 Volume: (cm) 1.708 Character of Wound/Ulcer Post Improved Debridement: Post Procedure Diagnosis Same as Pre-procedure Electronic Signature(s) Signed: 10/15/2016 4:20:11 PM By: Evlyn Kanner MD, FACS Signed: 10/15/2016 4:59:52 PM By: Elliot Gurney, BSN, RN, CWS, Kim RN, BSN Entered By: Evlyn Kanner on 10/15/2016  15:41:49 Trim, [name] ([id number]) -------------------------------------------------------------------------------- HPI Details Patient Name: [name]. Date of Service: 10/15/2016 3:00 PM Medical Record Number: [id number] Patient Account Number: [id number] Date of Birth/Sex: 1950/08/12 (66 y.o. 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 He has lower extremity sensation  and can move his legs but has no/limited control over his arms. His wife accompanies him today and states that in the late spring or early summer of 2017 the patient became very depressed. He refused the refused to mobilize and he developed several pressure ulcers on his back. Most of these have healed however they have a recalcitrant area over the right scapula. They've been using Santyl on this for at least the last month. In terms of depression the patient is doing better now on an antidepressant. He saw his primary physician on 01/12/16 at which time there was apparently green drainage coming out of this area [Dr. Deana Jones]. Dr. Yetta Barre works in the [facility]. He has completed this Septra. Otherwise looking through [facility] notes that he has a history of seizures. He also had a stroke in 2008 he follows with neurology. He also has type 2 diabetes on Glucophage, hyperlipidemia and gastroesophageal reflux. He takes Plavix for stroke prevention and Keppra for seizure prophylaxis. 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 02/08/16; this is a patient with a pressure ulcer over the right  scapula. His wife has been doing the dressing with Santyl and border foam change every second day. When he arrived here 2 weeks ago we did a fairly extensive mechanical debridement. We are asked medical modalities to go out to the home and see what they might be eligible for in terms of pressure-relief surfaces [level 2] but the wife states that they have not heard from them. 03/20/15; this is a patient we haven't seen in 5-6 weeks. This was largely due to transportation issues. They've been using Santyl and border foam changing every second day for a pressure injury over the right scapula. The patient has a C3-C4 spinal injury. We had also asked for a review by the people who supplied DME to look at his wheelchair cushion, mattress etc. I don't know that this ever happened. In the meantime the wound has done remarkably well current measurements 1.8 x 2 x 0.1 04/03/16; the patient's dimensions have gone up to 3 cm in diameter quite a deterioration from last time. He is also complaining of pain in this area which is new. 04/10/16; 3 x 1.5 x 0.1. No difference from last week. Culture I did last week was negative he has completed antibiotics. 04/24/16 2.4 x 2 x 0.1; wound generally looks smaller. Middle area that I had to debrided last time looks healthier. His son is fashioned a large piece of foam cut out where the patient's wound with hit the back of his wheelchair 05/01/16; wound is a same size however the surface of this looks better. The middle innkeeper area required a repeat debridement 05/15/16; wound is down and dimensions granulation still looks healthy. The medial aspect of this wound is now the deeper Divot. We'll see how this responds to further healing. We're using Hydrofera Blue 06/05/16; Wound 1.7x1.1x0.1 still using hyudrofera blue 06/26/16; the patient arrives back in clinic after a three-week hiatus. He was hospitalized from 06/09/16 through 06/10/16. He was found to be confused. CT scan  of the head showed nothing really acute. His sodium was Carte, Elisah [name]. ([id number]) low at 131. He was rehydrated. He was felt to have a UTI and given antibiotics and antibiotics at discharge although his final culture result only showed multiple organisms. His wife says today that at the time of the  hospitalization they discovered a large intact blister over the large aspect of his left calcaneus. This is recently ruptured. The wound on his right scapula is somewhat larger. More his wife is concerned about his current status. She states that he is still confused sleeps for long periods. He is not having fever chills cough or diarrhea [1 loose bowel movement per day]. He continues to have a suprapubic catheter. She notes that he is not eating and drinking well. Patient states he just does not want to eat. He does not feel nauseated or vomit. 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 Admission sodium was 131 at discharge 139 BUN 22 and 1.22 at admission, 17 and 1 at discharge. 07/03/16; patient's mental status is back to normal. He has a new wound on the right elbow caused by traumatizing his elbow against the wall apparently at the dermatologist office last week. We continue with the original wound on the right scapula and the wound from 2 weeks ago on his left heel. We have been using silver alginate to the area on the heel. 08/14/16; patient has not been here in almost 6 weeks. When he was here last time he had the pressure ulcer on the right scapula, atraumatic wound on the right lateral elbow and an area on his left heel. His wife states that at one point all of these were healed and she didn't really feel he needed to come back here. Since then the patient has developed a reopening of the area on the right scapula, a stage II wound on the left buttock and a reopening of the area on the right lateral elbow.  As usual his wife as a litany of complaints against home health, she is dismissing or is going to dismiss well care. She tells [place] she has a long list of supplies at home already for some reason they were not felt to be eligible for a group 2 or 3 surface although they have a hospital bed at home but no offloading surface 08/28/16; the patient arrived today unfortunately incontinent of stool in spite of this the wounds all appear to be better including the right scapular area, his left buttock's left heel and the right lateral elbow. 09/18/16; this is a patient who arrived today for follow-up of a pressure area over his right scapula area, left buttock and there was an area on his right lateral elbow. He arrived in clinic today with a worrisome area over the right initial tuberosity/right buttock. This had a necrotic surface with draining purulence. He has not been systemically unwell. The area over the left elbow healed. He arrived in clinic today with dressing that hadn't been changed over the scapula for 2 or 3 days per her intake. His wife is previously fired home health 09/25/16;; the last time the patient was here he arrived with a new grossly infected wound over the right ischial tuberosity. Culture I did not show a specific pathogen. I did think this required admission to hospital and he was admitted from 09/18/16 through 09/20/16. Initially given bank and Zosyn but then discharged with 5 days' worth of Augmentin. An MRI was done that did not show osteomyelitis of the right ischial tuberosity however it did show cellulitis and possible myositis. He also has wounds on the left ischial tuberosity and the area over the right scapular area. 10/04/16 on evaluation today patient appears to be doing  better in regard to his back wound and is stable in regard to the gluteal wounds. There does not appear to be any evidence of significant infection at this point. He is tolerating the dressing changes  currently. His wife has been performing the dressing changes. Nonetheless he does have some discomfort especially in the gluteal areas although he did not specifically rate the discomfort there were times during evaluation where he flinched and it was obvious it was bothering him. No fevers, chills, nausea, or vomiting noted at this time. Electronic Signature(s) Signed: 10/15/2016 4:20:11 PM By: Evlyn Kanner MD, FACS Entered By: Evlyn Kanner on 10/15/2016 15:42:24 Reineck, ROMUALDO PROSISE ([id number]) JERIMIE, MANCUSO ([id number]) -------------------------------------------------------------------------------- Physical Exam Details Patient Name: MEET, WEATHINGTON [name]. Date of Service: 10/15/2016 3:00 PM Medical Record Number: [id number] Patient Account Number: [id number] Date of Birth/Sex: 1950-10-30 (66 y.o. Male) Treating RN: Huel Coventry Primary Care Provider: Elizabeth Sauer Other Clinician: Referring Provider: Elizabeth Sauer Treating Provider/Extender: Rudene Re in Treatment: 37 Constitutional . Pulse regular. Respirations normal and unlabored. Afebrile. . Eyes Nonicteric. Reactive to light. Ears, Nose, Mouth, and Throat Lips, teeth, and gums WNL.Marland Kitchen Moist mucosa without lesions. Neck supple and nontender. No palpable supraclavicular or cervical adenopathy. Normal sized without goiter. Respiratory WNL. No retractions.. Cardiovascular Pedal Pulses WNL. No clubbing, cyanosis or edema. Lymphatic No adneopathy. No adenopathy. No adenopathy. Musculoskeletal Adexa without tenderness or enlargement.. Digits and nails w/o clubbing, cyanosis, infection, petechiae, ischemia, or inflammatory conditions.. Integumentary (Hair, Skin) No suspicious lesions. No crepitus or fluctuance. No peri-wound warmth or erythema. No masses.Marland Kitchen Psychiatric Judgement and insight Intact.. No evidence of depression, anxiety, or agitation.. Notes the wound on the right upper scapular area is looking very clean and  no debridement was required. The wounds on the ischial tuberosities right is bigger than the left and both needed sharp debridement with a #3 curet and minimal bleeding controlled with pressure. Electronic Signature(s) Signed: 10/15/2016 4:20:11 PM By: Evlyn Kanner MD, FACS Entered By: Evlyn Kanner on 10/15/2016 15:43:57 Betsch, [name] ([id number]) -------------------------------------------------------------------------------- Physician Orders Details Patient Name: GARED, GILLIE [name]. Date of Service: 10/15/2016 3:00 PM Medical Record Number: [id number] Patient Account Number: [id number] Date of Birth/Sex: 09-26-1950 (66 y.o. Male) Treating RN: Huel Coventry Primary Care Provider: Elizabeth Sauer Other Clinician: Referring Provider: Elizabeth Sauer Treating Provider/Extender: Rudene Re in Treatment: 69 Verbal / Phone Orders: No Diagnosis Coding Wound Cleansing Wound #1 Right,Proximal Back o Cleanse wound with mild soap and water Wound #4 Left Gluteus o Cleanse wound with mild soap and water Wound #5 Right Gluteus o Cleanse wound with mild soap and water Anesthetic Wound #1 Right,Proximal Back o Topical Lidocaine 4% cream applied to wound bed prior to debridement Wound #4 Left Gluteus o Topical Lidocaine 4% cream applied to wound bed prior to debridement Wound #5 Right Gluteus o Topical Lidocaine 4% cream applied to wound bed prior to debridement Skin Barriers/Peri-Wound Care Wound #1 Right,Proximal Back o Skin Prep Wound #4 Left Gluteus o Skin Prep Wound #5 Right Gluteus o Skin Prep Primary Wound Dressing Wound #4 Left Gluteus o Aquacel Ag Wound #5 Right Gluteus o Aquacel Ag Bluemel, Franklin [name]. ([id number]) Wound #1 Right,Proximal Back o Prisma Ag Secondary Dressing Wound #1 Right,Proximal Back o Boardered Foam Dressing Wound #4 Left Gluteus o Boardered Foam Dressing Wound #5 Right Gluteus o Boardered Foam Dressing Dressing Change  Frequency Wound #1 Right,Proximal Back o Change dressing every other day. Wound #4 Left Gluteus o Change dressing every other day. Wound #5 Right  Gluteus o Change dressing every other day. Follow-up Appointments Wound #1 Right,Proximal Back o Return Appointment in 1 week. Wound #4 Left Gluteus o Return Appointment in 1 week. Wound #5 Right Gluteus o Return Appointment in 1 week. Off-Loading Wound #1 Right,Proximal Back o Roho cushion for wheelchair o Turn and reposition every 2 hours Wound #4 Left Gluteus o Roho cushion for wheelchair o Turn and reposition every 2 hours Wound #5 Right Gluteus o Roho cushion for wheelchair o Turn and reposition every 2 hours Lekas, Dajohn [name]. ([id number]) Additional Orders / Instructions Wound #1 Right,Proximal Back o Increase protein intake. o Other: - Vitamins A, [name] and Zinc Wound #4 Left Gluteus o Increase protein intake. o Other: - Vitamins A, [name] and Zinc Wound #5 Right Gluteus o Increase protein intake. o Other: - Vitamins A, [name] and Zinc Electronic Signature(s) Signed: 10/15/2016 4:20:11 PM By: Evlyn Kanner MD, FACS Signed: 10/15/2016 4:59:52 PM By: Elliot Gurney, BSN, RN, CWS, Kim RN, BSN Entered By: Elliot Gurney, BSN, RN, CWS, Kim on 10/15/2016 15:35:11 Tayloe, [name] ([id number]) -------------------------------------------------------------------------------- Problem List Details Patient Name: JAZZIEL, FITZSIMMONS. Date of Service: 10/15/2016 3:00 PM Medical Record Number: [id number] Patient Account Number: [id number] Date of Birth/Sex: December 29, 1950 (66 y.o. Male) Treating RN: Huel Coventry Primary Care Provider: Elizabeth Sauer Other Clinician: Referring Provider: Elizabeth Sauer Treating Provider/Extender: Rudene Re in Treatment: 37 Active Problems ICD-10 Encounter Code Description Active Date Diagnosis 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, stage 3 01/24/2016 Yes S14.103S Unspecified injury at C3 level of  cervical spinal cord, 01/24/2016 Yes sequela S51.011A Laceration without foreign body of right elbow, initial 07/03/2016 Yes encounter L89.322 Pressure ulcer of left buttock, stage 2 08/14/2016 Yes L89.310 Pressure ulcer of right buttock, unstageable 09/18/2016 Yes L03.317 Cellulitis of buttock 09/18/2016 Yes Inactive Problems Resolved Problems ICD-10 Code Description Active Date Resolved Date L89.622 Pressure ulcer of left heel, stage 2 06/26/2016 06/26/2016 Electronic Signature(s) Signed: 10/15/2016 4:20:11 PM By: Evlyn Kanner MD, FACS Entered By: Evlyn Kanner on 10/15/2016 15:41:11 Kincade, KIETH HARTIS ([id number]) Caples, Alex Salena Saner ([id number]) -------------------------------------------------------------------------------- Progress Note Details Patient Name: [name]. Date of Service: 10/15/2016 3:00 PM Medical Record Number: [id number] Patient Account Number: [id number] Date of Birth/Sex: 10-06-1950 (65 y.o. 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 He has lower extremity sensation and can move his legs but has no/limited control over his arms. His wife accompanies him today and states that in the late spring or early summer of 2017 the patient became very depressed. He refused the refused to mobilize and he developed several pressure ulcers on his back. Most of these have healed however they have a recalcitrant area over the right scapula. They've been using Santyl on this for  at least the last month. In terms of depression the patient is doing better now on an antidepressant. He saw his primary physician on 01/12/16 at which time there was apparently green drainage coming out of this area [Dr. Deana Jones]. Dr. Yetta Barre works in the [facility]. He has completed this Septra. Otherwise looking through  [facility] notes that he has a history of seizures. He also had a stroke in 2008 he follows with neurology. He also has type 2 diabetes on Glucophage, hyperlipidemia and gastroesophageal reflux. He takes Plavix for stroke prevention and Keppra for seizure prophylaxis. 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 02/08/16; this is a patient with a pressure ulcer over the right scapula. His wife has been doing the dressing with Santyl and border foam change every second day. When he arrived here 2 weeks ago we did a fairly extensive mechanical debridement. We are asked medical modalities to go out to the home and see what they might be eligible for in terms of pressure-relief surfaces [level 2] but the wife states that they have not heard from them. 03/20/15; this is a patient we haven't seen in 5-6 weeks. This was largely due to transportation issues. They've been using Santyl and border foam changing every second day for a pressure injury over the right scapula. The patient has a C3-C4 spinal injury. We had also asked for a review by the people who supplied DME to look at his wheelchair cushion, mattress etc. I don't know that this ever happened. In the meantime the wound has done remarkably well current measurements 1.8 x 2 x 0.1 04/03/16; the patient's dimensions have gone up to 3 cm in diameter quite a deterioration from last time. He is also complaining of pain in this area which is new. 04/10/16; 3 x 1.5 x 0.1. No difference from last week. Culture I did last week was negative he  has completed antibiotics. 04/24/16 2.4 x 2 x 0.1; wound generally looks smaller. Middle area that I had to debrided last time looks healthier. His son is fashioned a large piece of foam cut out where the patient's wound with hit the back of his wheelchair 05/01/16; wound is a same size however the surface of this looks better. The middle innkeeper area required [facility], AVISHAI REIHL. ([id number]) a repeat debridement 05/15/16; wound is down and dimensions granulation still looks healthy. The medial aspect of this wound is now the deeper Divot. We'll see how this responds to further healing. We're using Hydrofera Blue 06/05/16; Wound 1.7x1.1x0.1 still using hyudrofera blue 06/26/16; the patient arrives back in clinic after a three-week hiatus. He was hospitalized from 06/09/16 through 06/10/16. He was found to be confused. CT scan of the head showed nothing really acute. His sodium was low at 131. He was rehydrated. He was felt to have a UTI and given antibiotics and antibiotics at discharge although his final culture result only showed multiple organisms. His wife says today that at the time of the hospitalization they discovered a large intact blister over the large aspect of his left calcaneus. This is recently ruptured. The wound on his right scapula is somewhat larger. More his wife is concerned about his current status. She states that he is still confused sleeps for long periods. He is not having fever chills cough or diarrhea [1 loose bowel movement per day]. He continues to have a suprapubic catheter. She notes that he is not eating and drinking well. Patient states he just does not want to eat. He does not feel nauseated or vomit. 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 Admission sodium was 131 at discharge 139 BUN 22 and 1.22 at admission, 17  and 1 at discharge. 07/03/16; patient's mental status is back to  normal. He has a new wound on the right elbow caused by traumatizing his elbow against the wall apparently at the dermatologist office last week. We continue with the original wound on the right scapula and the wound from 2 weeks ago on his left heel. We have been using silver alginate to the area on the heel. 08/14/16; patient has not been here in almost 6 weeks. When he was here last time he had the pressure ulcer on the right scapula, atraumatic wound on the right lateral elbow and an area on his left heel. His wife states that at one point all of these were healed and she didn't really feel he needed to come back here. Since then the patient has developed a reopening of the area on the right scapula, a stage II wound on the left buttock and a reopening of the area on the right lateral elbow. As usual his wife as a litany of complaints against home health, she is dismissing or is going to dismiss well care. She tells [place] she has a long list of supplies at home already for some reason they were not felt to be eligible for a group 2 or 3 surface although they have a hospital bed at home but no offloading surface 08/28/16; the patient arrived today unfortunately incontinent of stool in spite of this the wounds all appear to be better including the right scapular area, his left buttock's left heel and the right lateral elbow. 09/18/16; this is a patient who arrived today for follow-up of a pressure area over his right scapula area, left buttock and there was an area on his right lateral elbow. He arrived in clinic today with a worrisome area over the right initial tuberosity/right buttock. This had a necrotic surface with draining purulence. He has not been systemically unwell. The area over the left elbow healed. He arrived in clinic today with dressing that hadn't been changed over the scapula for 2 or 3 days per her intake. His wife is previously fired home health 09/25/16;; the last time the patient  was here he arrived with a new grossly infected wound over the right ischial tuberosity. Culture I did not show a specific pathogen. I did think this required admission to hospital and he was admitted from 09/18/16 through 09/20/16. Initially given bank and Zosyn but then discharged with 5 days' worth of Augmentin. An MRI was done that did not show osteomyelitis of the right ischial tuberosity however it did show cellulitis and possible myositis. He also has wounds on the left ischial tuberosity and the area over the right scapular area. 10/04/16 on evaluation today patient appears to be doing better in regard to his back wound and is stable in regard to the gluteal wounds. There does not appear to be any evidence of significant infection at this point. He is tolerating the dressing changes currently. His wife has been performing the dressing changes. Nonetheless he does have some discomfort especially in the gluteal areas although he did not specifically Forker, Zymier [name]. ([id number]) rate the discomfort there were times during evaluation where he flinched and it was obvious it was bothering him. No fevers, chills, nausea, or vomiting noted at this time. Objective Constitutional Pulse regular. Respirations normal and unlabored. Afebrile. Vitals Time Taken: 3:12 PM, Height: 70 in, Weight: 187 lbs, BMI: 26.8, Temperature: 97.5 F, Pulse: 70 bpm, Respiratory Rate: 16 breaths/min, Blood Pressure: 129/46 mmHg. Eyes  Nonicteric. Reactive to light. Ears, Nose, Mouth, and Throat Lips, teeth, and gums WNL.Marland Kitchen Moist mucosa without lesions. Neck supple and nontender. No palpable supraclavicular or cervical adenopathy. Normal sized without goiter. Respiratory WNL. No retractions.. Cardiovascular Pedal Pulses WNL. No clubbing, cyanosis or edema. Lymphatic No adneopathy. No adenopathy. No adenopathy. Musculoskeletal Adexa without tenderness or enlargement.. Digits and nails w/o clubbing, cyanosis,  infection, petechiae, ischemia, or inflammatory conditions.Marland Kitchen Psychiatric Judgement and insight Intact.. No evidence of depression, anxiety, or agitation.. General Notes: the wound on the right upper scapular area is looking very clean and no debridement was required. The wounds on the ischial tuberosities right is bigger than the left and both needed sharp debridement with a #3 curet and minimal bleeding controlled with pressure. Borba, Monty [name]. ([id number]) Integumentary (Hair, Skin) No suspicious lesions. No crepitus or fluctuance. No peri-wound warmth or erythema. No masses.. Wound #1 status is Open. Original cause of wound was Pressure Injury. The wound is located on the Right,Proximal Back. The wound measures 1cm length x 1.3cm width x 0.1cm depth; 1.021cm^2 area and 0.102cm^3 volume. Wound #4 status is Open. Original cause of wound was Pressure Injury. The wound is located on the Left Gluteus. The wound measures 0.9cm length x 1.1cm width x 0.1cm depth; 0.778cm^2 area and 0.078cm^3 volume. There is Fat Layer (Subcutaneous Tissue) Exposed exposed. There is a large amount of serous drainage noted. The wound margin is flat and intact. There is no granulation within the wound bed. There is a large (67-100%) amount of necrotic tissue within the wound bed including Adherent Slough. The periwound skin appearance exhibited: Induration, Maceration, Ecchymosis. Periwound temperature was noted as No Abnormality. The periwound has tenderness on palpation. Wound #5 status is Open. Original cause of wound was Pressure Injury. The wound is located on the Right Gluteus. The wound measures 2.9cm length x 2.5cm width x 0.3cm depth; 5.694cm^2 area and 1.708cm^3 volume. There is Fat Layer (Subcutaneous Tissue) Exposed exposed. There is undermining starting at 5:00 and ending at 9:00 with a maximum distance of 0.6cm. There is a large amount of serosanguineous drainage noted. The wound margin is flat and  intact. There is small (1-33%) granulation within the wound bed. There is a large (67-100%) amount of necrotic tissue within the wound bed including Adherent Slough. The periwound skin appearance exhibited: Excoriation, Scarring. The periwound skin appearance did not exhibit: Callus, Crepitus, Induration, Rash, Dry/Scaly, Maceration, Atrophie Blanche, Cyanosis, Ecchymosis, Hemosiderin Staining, Mottled, Pallor, Rubor, Erythema. Periwound temperature was noted as No Abnormality. The periwound has tenderness on palpation. 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 There was a Reesman, Drevin [name]. ([id number]) Skin/Subcutaneous Tissue Debridement [phone]) debridement with total area of 0.99 sq cm performed by Evlyn Kanner, MD. with the following instrument(s): Curette to remove Viable and Non-Viable tissue/material including Subcutaneous after achieving pain control using Other (lidocaine 4%). A time out was conducted at 15:31, prior to the start of the procedure. A Minimum amount of bleeding was controlled with Pressure. The procedure was tolerated well with a pain level of 1 throughout and a pain level of 0 following the  procedure. Post Debridement Measurements: 0.9cm length x 1.1cm width x 0.1cm depth; 0.078cm^3 volume. Post debridement Stage noted as Category/Stage II. Character of Wound/Ulcer Post Debridement is improved. Post procedure Diagnosis Wound #4: Same as Pre-Procedure Wound #5 Pre-procedure diagnosis of Wound #5 is a Pressure Ulcer located on the  Right Gluteus . There was a Skin/Subcutaneous Tissue Debridement ([phone]) debridement with total area of 7.25 sq cm performed by Evlyn Kanner, MD. with the following instrument(s): Curette to remove Viable and Non-Viable tissue/material including Subcutaneous after achieving pain control using Other (lidocaine 4%). A time out was conducted at 15:31, prior to the start of the procedure. A Minimum amount of bleeding was controlled with Pressure. The procedure was tolerated well with a pain level of 1 throughout and a pain level of 0 following the procedure. Post Debridement Measurements: 2.9cm length x 2.5cm width x 0.3cm depth; 1.708cm^3 volume. Post debridement Stage noted as Unstageable/Unclassified. Character of Wound/Ulcer Post Debridement is improved. Post procedure Diagnosis Wound #5: Same as Pre-Procedure Plan Wound Cleansing: Wound #1 Right,Proximal Back: Cleanse wound with mild soap and water Wound #4 Left Gluteus: Cleanse wound with mild soap and water Wound #5 Right Gluteus: Cleanse wound with mild soap and water Anesthetic: Wound #1 Right,Proximal Back: Topical Lidocaine 4% cream applied to wound bed prior to debridement Wound #4 Left Gluteus: Topical Lidocaine 4% cream applied to wound bed prior to debridement Wound #5 Right Gluteus: Topical Lidocaine 4% cream applied to wound bed prior to debridement Skin Barriers/Peri-Wound Care: Wound #1 Right,Proximal Back: Skin Prep Wound #4 Left Gluteus: Skin Prep Wound #5 Right Gluteus: Torrez, Arlow [name]. ([id number]) Skin Prep Primary Wound Dressing: Wound #4 Left Gluteus: Aquacel Ag Wound #5 Right Gluteus: Aquacel Ag Wound #1 Right,Proximal Back: Prisma Ag Secondary Dressing: Wound #1 Right,Proximal Back: Boardered Foam Dressing Wound #4 Left Gluteus: Boardered Foam Dressing Wound #5 Right Gluteus: Boardered Foam Dressing Dressing Change Frequency: Wound #1 Right,Proximal Back: Change dressing every other  day. Wound #4 Left Gluteus: Change dressing every other day. Wound #5 Right Gluteus: Change dressing every other day. Follow-up Appointments: Wound #1 Right,Proximal Back: Return Appointment in 1 week. Wound #4 Left Gluteus: Return Appointment in 1 week. Wound #5 Right Gluteus: Return Appointment in 1 week. Off-Loading: Wound #1 Right,Proximal Back: Roho cushion for wheelchair Turn and reposition every 2 hours Wound #4 Left Gluteus: Roho cushion for wheelchair Turn and reposition every 2 hours Wound #5 Right Gluteus: Roho cushion for wheelchair Turn and reposition every 2 hours Additional Orders / Instructions: Wound #1 Right,Proximal Back: Increase protein intake. Other: - Vitamins A, [name] and Zinc Wound #4 Left Gluteus: Increase protein intake. Other: - Vitamins A, [name] and Zinc Wound #5 Right Gluteus: Increase protein intake. Other: - Vitamins A, [name] and Zinc Orsak, Bassel [name]. ([id number]) the patient's wife who is his caregiver confirms that he has a low air loss mattress, is been off loading very well and takes adequate proteins. I have recommended Prisma AG for the right upper shoulder and silver alginate to the 2 areas on the ischial tuberosities. adequate proteins, vitamin A, vitamin [name] and zinc have also been emphasized Electronic Signature(s) Signed: 10/15/2016 4:20:11 PM By: Evlyn Kanner MD, FACS Entered By: Evlyn Kanner on 10/15/2016 15:45:42 Dewan, [name] ([id number]) -------------------------------------------------------------------------------- SuperBill Details Patient Name: Lykins, Andray [name]. Date of Service: 10/15/2016 Medical Record Number: [id number] Patient Account Number: [id number] Date of Birth/Sex: 19-Oct-1950 (66 y.o. 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, stage 3 S14.103S Unspecified injury at C3 level of cervical spinal cord, sequela S51.011A Laceration without foreign body of right elbow, initial encounter L89.322 Pressure ulcer of left buttock, stage 2 L89.310 Pressure ulcer of right buttock, unstageable L03.317 Cellulitis of buttock Facility Procedures CPT4 Code: [id number] Description: 11042 - DEB SUBQ TISSUE 20 SQ CM/< ICD-10 Description Diagnosis 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, stage 3 S14.103S Unspecified injury at C3 level of cervical spinal L89.322 Pressure ulcer of left buttock, stage 2 L89.310 Pressure ulcer  of right buttock, unstageable Modifier: cord, sequela Quantity: 1 Physician Procedures CPT4 Code: [id number] Description: 11042 - WC PHYS SUBQ TISS 20 SQ CM ICD-10 Description Diagnosis 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

## 2016-10-16 NOTE — Progress Notes (Signed)
KIMONI, PAGLIARULO ([id number]) Visit Report for 10/15/2016 Arrival Information Details Patient Name: [name], [name]. Date of Service: 10/15/2016 3:00 PM Medical Record Number: [id number] Patient Account Number: [id number] Date of Birth/Sex: [birth year] (66 y.o. Male) Treating RN: Huel Coventry Primary Care Luke Rigsbee: Elizabeth Sauer Other Clinician: Referring Zair Borawski: Elizabeth Sauer Treating Jakaiden Fill/Extender: Rudene Re in Treatment: 37 Visit Information History Since Last Visit Added or deleted any medications: No Patient Arrived: Wheel Chair Any new allergies or adverse reactions: No Arrival Time: 15:12 Had a fall or experienced change in No activities of daily living that may affect Accompanied By: wife risk of falls: Transfer Assistance: None Signs or symptoms of abuse/neglect since last No Patient Identification Verified: Yes visito Secondary Verification Process Yes Hospitalized since last visit: No Completed: Has Dressing in Place as Prescribed: Yes Patient Requires Transmission-Based No Pain Present Now: No Precautions: Patient Has Alerts: Yes Electronic Signature(s) Signed: 10/15/2016 4:59:52 PM By: Elliot Gurney, BSN, RN, CWS, Kim RN, BSN Entered By: Elliot Gurney, BSN, RN, CWS, Kim on 10/15/2016 15:12:30 Monrroy, [name] ([id number]) -------------------------------------------------------------------------------- Encounter Discharge Information Details Patient Name: [name], [name]. Date of Service: 10/15/2016 3:00 PM Medical Record Number: [id number] Patient Account Number: [id number] Date of Birth/Sex: [birth year] (66 y.o. Male) Treating RN: Huel Coventry Primary Care Prithvi Kooi: Elizabeth Sauer Other Clinician: Referring Ayaan Ringle: Elizabeth Sauer Treating Knight Oelkers/Extender: Rudene Re in Treatment: 50 Encounter Discharge Information Items Discharge Pain Level: 0 Discharge Condition: Stable Ambulatory Status: Wheelchair Discharge Destination: Home Transportation:  Private Auto Accompanied By: wife Schedule Follow-up Appointment: Yes Medication Reconciliation completed and provided to Patient/Care Yes Boen Sterbenz: Provided on Clinical Summary of Care: 10/15/2016 Form Type Recipient Paper Patient [place] Electronic Signature(s) Signed: 10/15/2016 4:59:52 PM By: Elliot Gurney, BSN, RN, CWS, Kim RN, BSN Entered By: Elliot Gurney, BSN, RN, CWS, Kim on 10/15/2016 16:56:43 Dea, [name] ([id number]) -------------------------------------------------------------------------------- Lower Extremity Assessment Details Patient Name: [name], [name]. Date of Service: 10/15/2016 3:00 PM Medical Record Number: [id number] Patient Account Number: [id number] Date of Birth/Sex: 04-May-1950 (67 y.o. Male) Treating RN: Huel Coventry Primary Care Kayna Suppa: Elizabeth Sauer Other Clinician: Referring Brandom Kerwin: Elizabeth Sauer Treating Sheila Gervasi/Extender: Rudene Re in Treatment: 37 Electronic Signature(s) Signed: 10/15/2016 4:59:52 PM By: Elliot Gurney, BSN, RN, CWS, Kim RN, BSN Entered By: Elliot Gurney, BSN, RN, CWS, Kim on 10/15/2016 15:27:36 Oley, [name] ([id number]) -------------------------------------------------------------------------------- Multi Wound Chart Details Patient Name: [name], [name]. Date of Service: 10/15/2016 3:00 PM Medical Record Number: [id number] Patient Account Number: [id number] Date of Birth/Sex: April 26, 1950 (66 y.o. Male) Treating RN: Huel Coventry Primary Care Evann Koelzer: Elizabeth Sauer Other Clinician: Referring Zaydon Kinser: Elizabeth Sauer Treating Latorria Zeoli/Extender: Rudene Re in Treatment: 37 Vital Signs Height(in): 70 Pulse(bpm): 70 Weight(lbs): 187 Blood Pressure 129/46 (mmHg): Body Mass Index(BMI): 27 Temperature(F): 97.5 Respiratory Rate 16 (breaths/min): Photos: [1:No Photos] [4:No Photos] [5:No Photos] Wound Location: [1:Right, Proximal Back] [4:Left Gluteus] [5:Right Gluteus] Wounding Event: [1:Pressure Injury] [4:Pressure Injury]  [5:Pressure Injury] Primary Etiology: [1:Pressure Ulcer] [4:Pressure Ulcer] [5:Pressure Ulcer] Comorbid History: [1:N/A] [4:Coronary Artery Disease, Type II Diabetes, Quadriplegia, Seizure Disorder] [5:Coronary Artery Disease, Type II Diabetes, Quadriplegia, Seizure Disorder] Date Acquired: [1:11/24/2015] [4:07/09/2016] [5:09/10/2016] Weeks of Treatment: [1:37] [4:8] [5:3] Wound Status: [1:Open] [4:Open] [5:Open] Measurements L x W x D 1x1.3x0.1 [4:0.9x1.1x0.1] [5:2.9x2.5x0.3] (cm) Area (cm) : [1:1.021] [4:0.778] [5:5.694] Volume (cm) : [1:0.102] [4:0.078] [5:1.708] % Reduction in Area: [1:87.30%] [4:-395.50%] [5:-0.70%] % Reduction in Volume: 87.30% [4:-387.50%] [5:-202.30%] Starting Position 1 [5:5] (o'clock): Ending Position 1 [5:9] (o'clock): Maximum Distance 1 [5:0.6] (cm): Undermining: [1:N/A] [4:N/A] [5:Yes] Classification: [1:Category/Stage II] [4:Category/Stage  II] [5:Unstageable/Unclassified] Exudate Amount: [1:N/A] [4:Large] [5:Large] Exudate Type: [1:N/A] [4:Serous] [5:Serosanguineous] Exudate Color: [1:N/A] [4:amber] [5:red, brown] Foul Odor After [1:N/A] [4:No] [5:Yes] Cleansing: [name], [name]. ([id number]) Odor Anticipated Due to N/A N/A No Product Use: Wound Margin: N/A Flat and Intact Flat and Intact Granulation Amount: N/A None Present (0%) Small (1-33%) Necrotic Amount: N/A Large (67-100%) Large (67-100%) Epithelialization: N/A None None Debridement: N/A Debridement ([id number]- Debridement ([id number]) 11047) Pre-procedure N/A 15:31 15:31 Verification/Time Out Taken: Pain Control: N/A Other Other Tissue Debrided: N/A Subcutaneous Subcutaneous Level: N/A Skin/Subcutaneous Skin/Subcutaneous Tissue Tissue Debridement Area (sq N/A 0.99 7.25 cm): Instrument: N/A Curette Curette Bleeding: N/A Minimum Minimum Hemostasis Achieved: N/A Pressure Pressure Procedural Pain: N/A 1 1 Post Procedural Pain: N/A 0 0 Debridement Treatment N/A Procedure was tolerated  Procedure was tolerated Response: well well Post Debridement N/A 0.9x1.1x0.1 2.9x2.5x0.3 Measurements L x W x D (cm) Post Debridement N/A 0.078 1.708 Volume: (cm) Post Debridement N/A Category/Stage II Unstageable/Unclassified Stage: Periwound Skin Texture: No Abnormalities Noted Induration: Yes Excoriation: Yes Scarring: Yes Induration: No Callus: No Crepitus: No Rash: No Periwound Skin No Abnormalities Noted Maceration: Yes Maceration: No Moisture: Dry/Scaly: No Periwound Skin Color: No Abnormalities Noted Ecchymosis: Yes Atrophie Blanche: No Cyanosis: No Ecchymosis: No Erythema: No Hemosiderin Staining: No Mottled: No Pallor: No Rubor: No Temperature: N/A No Abnormality No Abnormality Tenderness on No Yes Yes Palpation: [name], [name]. ([id number]) Wound Preparation: N/A Ulcer Cleansing: Ulcer Cleansing: Rinsed/Irrigated with Rinsed/Irrigated with Saline Saline Topical Anesthetic Topical Anesthetic Applied: Other: lidocaine Applied: Other: lidocaine 4% 4% Procedures Performed: N/A Debridement Debridement Treatment Notes Electronic Signature(s) Signed: 10/15/2016 4:20:11 PM By: Evlyn Kanner MD, FACS Entered By: Evlyn Kanner on 10/15/2016 15:41:19 Rhew, [name] ([id number]) -------------------------------------------------------------------------------- Multi-Disciplinary Care Plan Details Patient Name: BARY, LIMBACH [name]. Date of Service: 10/15/2016 3:00 PM Medical Record Number: [id number] Patient Account Number: [id number] Date of Birth/Sex: 1950/07/29 (66 y.o. Male) Treating RN: Huel Coventry Primary Care Jaedon Siler: Elizabeth Sauer Other Clinician: Referring Lonzy Mato: Elizabeth Sauer Treating Rhia Blatchford/Extender: Rudene Re in Treatment: 62 Active Inactive ` Nutrition Nursing Diagnoses: Imbalanced nutrition Impaired glucose control: actual or potential Goals: Patient/caregiver agrees to and verbalizes understanding of need to use nutritional  supplements and/or vitamins as prescribed Date Initiated: 01/24/2016 Target Resolution Date: 03/27/2016 Goal Status: Active Patient/caregiver will maintain therapeutic glucose control Date Initiated: 01/24/2016 Target Resolution Date: 03/27/2016 Goal Status: Active Interventions: Assess patient nutrition upon admission and as needed per policy Provide education on elevated blood sugars and impact on wound healing Notes: ` Orientation to the Wound Care Program Nursing Diagnoses: Knowledge deficit related to the wound healing center program Goals: Patient/caregiver will verbalize understanding of the Wound Healing Center Program Date Initiated: 01/24/2016 Target Resolution Date: 03/27/2016 Goal Status: Active Interventions: Provide education on orientation to the wound center Notes: HARMON, BOMMARITO ([id number]) ` Pain, Acute or Chronic Nursing Diagnoses: Pain, acute or chronic: actual or potential Potential alteration in comfort, pain Goals: Patient will verbalize adequate pain control and receive pain control interventions during procedures as needed Date Initiated: 01/24/2016 Target Resolution Date: 03/27/2016 Goal Status: Active Patient/caregiver will verbalize adequate pain control between visits Date Initiated: 01/24/2016 Target Resolution Date: 03/27/2016 Goal Status: Active Patient/caregiver will verbalize comfort level met Date Initiated: 01/24/2016 Target Resolution Date: 03/27/2016 Goal Status: Active Interventions: Assess comfort goal upon admission Complete pain assessment as per visit requirements Notes: ` Pressure Nursing Diagnoses: Knowledge deficit related to management of pressures ulcers Potential for impaired tissue integrity related to pressure, friction, moisture, and shear  Goals: Patient will remain free from development of additional pressure ulcers Date Initiated: 01/24/2016 Target Resolution Date: 03/27/2016 Goal Status:  Active Interventions: Assess: immobility, friction, shearing, incontinence upon admission and as needed Assess offloading mechanisms upon admission and as needed Assess potential for pressure ulcer upon admission and as needed Provide education on pressure ulcers Notes: VINT, POLA ([id number]) Wound/Skin Impairment Nursing Diagnoses: Impaired tissue integrity Goals: Ulcer/skin breakdown will have a volume reduction of 30% by week 4 Date Initiated: 01/24/2016 Target Resolution Date: 03/27/2016 Goal Status: Active Ulcer/skin breakdown will have a volume reduction of 50% by week 8 Date Initiated: 01/24/2016 Target Resolution Date: 03/27/2016 Goal Status: Active Ulcer/skin breakdown will have a volume reduction of 80% by week 12 Date Initiated: 01/24/2016 Target Resolution Date: 03/27/2016 Goal Status: Active Interventions: Assess patient/caregiver ability to perform ulcer/skin care regimen upon admission and as needed Assess ulceration(s) every visit Notes: Electronic Signature(s) Signed: 10/15/2016 4:59:52 PM By: Gretta Cool, BSN, RN, CWS, Kim RN, BSN Entered By: Gretta Cool, BSN, RN, CWS, Kim on 10/15/2016 15:30:53 Hord, Leslye Peer ([id number]) -------------------------------------------------------------------------------- Pain Assessment Details Patient Name: Tana Coast [name]. Date of Service: 10/15/2016 3:00 PM Medical Record Number: [id number] Patient Account Number: [id number] Date of Birth/Sex: 12-Aug-1950 (66 y.o. Male) Treating RN: Cornell Barman Primary Care Mccall Lomax: Otilio Miu Other Clinician: Referring Meeah Totino: Otilio Miu Treating Mate Alegria/Extender: Frann Rider in Treatment: 37 Active Problems Location of Pain Severity and Description of Pain Patient Has Paino No Site Locations With Dressing Change: No Pain Management and Medication Current Pain Management: Electronic Signature(s) Signed: 10/15/2016 4:59:52 PM By: Gretta Cool, BSN, RN, CWS, Kim RN, BSN Entered  By: Gretta Cool, BSN, RN, CWS, Kim on 10/15/2016 15:12:39 Little, Leslye Peer ([id number]) -------------------------------------------------------------------------------- Patient/Caregiver Education Details Patient Name: [name]. Date of Service: 10/15/2016 3:00 PM Medical Record Number: [id number] Patient Account Number: [id number] Date of Birth/Gender: 12/12/1950 (66 y.o. Male) Treating RN: Cornell Barman Primary Care Physician: Otilio Miu Other Clinician: Referring Physician: Otilio Miu Treating Physician/Extender: Frann Rider in Treatment: 69 Education Assessment Education Provided To: Patient Education Topics Provided Pressure: Handouts: Pressure Ulcers: Care and Offloading Methods: Demonstration, Explain/Verbal Responses: State content correctly Wound/Skin Impairment: Handouts: Caring for Your Ulcer Methods: Demonstration Responses: State content correctly Electronic Signature(s) Signed: 10/15/2016 4:59:52 PM By: Gretta Cool, BSN, RN, CWS, Kim RN, BSN Entered By: Gretta Cool, BSN, RN, CWS, Kim on 10/15/2016 16:57:09 Ahlin, Leslye Peer ([id number]) -------------------------------------------------------------------------------- Wound Assessment Details Patient Name: [name], [name] [name]. Date of Service: 10/15/2016 3:00 PM Medical Record Number: [id number] Patient Account Number: [id number] Date of Birth/Sex: [birth year] (66 y.o. Male) Treating RN: Cornell Barman Primary Care Leeya Rusconi: Otilio Miu Other Clinician: Referring Tadashi Burkel: Otilio Miu Treating Sydell Prowell/Extender: Frann Rider in Treatment: 37 Wound Status Wound Number: 1 Primary Etiology: Pressure Ulcer Wound Location: Right, Proximal Back Wound Status: Open Wounding Event: Pressure Injury Date Acquired: 11/24/2015 Weeks Of Treatment: 37 Clustered Wound: No Wound Measurements Length: (cm) 1 Width: (cm) 1.3 Depth: (cm) 0.1 Area: (cm) 1.021 Volume: (cm) 0.102 % Reduction in Area: 87.3% % Reduction in  Volume: 87.3% Wound Description Classification: Category/Stage II Periwound Skin Texture Texture Color No Abnormalities Noted: No No Abnormalities Noted: No Moisture No Abnormalities Noted: No Treatment Notes Wound #1 (Right, Proximal Back) 1. Cleansed with: Clean wound with Normal Saline 2. Anesthetic Topical Lidocaine 4% cream to wound bed prior to debridement 4. Dressing Applied: Prisma Ag 5. Secondary Dressing Applied Bordered Foam Dressing Electronic Signature(s) Signed: 10/15/2016 4:59:52 PM By: Gretta Cool, BSN, RN, CWS, Kim RN, BSN Entered By: Gretta Cool,  BSN, RN, CWS, Kim on 10/15/2016 15:16:11 [place], IBRAHEEM VORIS ([id number]) [address] [name] ([id number]) -------------------------------------------------------------------------------- Wound Assessment Details Patient Name: [name], PETTET. Date of Service: 10/15/2016 3:00 PM Medical Record Number: [id number] Patient Account Number: [id number] Date of Birth/Sex: 08/10/1950 (66 y.o. Male) Treating RN: Cornell Barman Primary Care Lynisha Osuch: Otilio Miu Other Clinician: Referring Prince Couey: Otilio Miu Treating Talmage Teaster/Extender: Frann Rider in Treatment: 37 Wound Status Wound Number: 4 Primary Pressure Ulcer Etiology: Wound Location: Left Gluteus Wound Open Wounding Event: Pressure Injury Status: Date Acquired: 07/09/2016 Comorbid Coronary Artery Disease, Type II Weeks Of Treatment: 8 History: Diabetes, Quadriplegia, Seizure Clustered Wound: No Disorder Wound Measurements Length: (cm) 0.9 Width: (cm) 1.1 Depth: (cm) 0.1 Area: (cm) 0.778 Volume: (cm) 0.078 % Reduction in Area: -395.5% % Reduction in Volume: -387.5% Epithelialization: None Wound Description Classification: Category/Stage II Wound Margin: Flat and Intact Exudate Amount: Large Exudate Type: Serous Exudate Color: amber Foul Odor After Cleansing: No Slough/Fibrino Yes Wound Bed Granulation Amount: None Present (0%) Exposed  Structure Necrotic Amount: Large (67-100%) Fascia Exposed: No Necrotic Quality: Adherent Slough Fat Layer (Subcutaneous Tissue) Exposed: Yes Tendon Exposed: No Muscle Exposed: No Joint Exposed: No Bone Exposed: No Periwound Skin Texture Texture Color No Abnormalities Noted: No No Abnormalities Noted: No Induration: Yes Ecchymosis: Yes Moisture Temperature / Pain No Abnormalities Noted: No Temperature: No Abnormality Maceration: Yes Tenderness on Palpation: Yes [name], [name]. ([id number]) Wound Preparation Ulcer Cleansing: Rinsed/Irrigated with Saline Topical Anesthetic Applied: Other: lidocaine 4%, Treatment Notes Wound #4 (Left Gluteus) 1. Cleansed with: Clean wound with Normal Saline 2. Anesthetic Topical Lidocaine 4% cream to wound bed prior to debridement 3. Peri-wound Care: Skin Prep 4. Dressing Applied: Aquacel Ag 5. Secondary Dressing Applied Bordered Foam Dressing Electronic Signature(s) Signed: 10/15/2016 4:59:52 PM By: Gretta Cool, BSN, RN, CWS, Kim RN, BSN Entered By: Gretta Cool, BSN, RN, CWS, Kim on 10/15/2016 15:22:44 [place], Leslye Peer ([id number]) -------------------------------------------------------------------------------- Wound Assessment Details Patient Name: [name], [name]. Date of Service: 10/15/2016 3:00 PM Medical Record Number: [id number] Patient Account Number: [id number] Date of Birth/Sex: 18-Nov-1950 (66 y.o. Male) Treating RN: Cornell Barman Primary Care Afrika Brick: Otilio Miu Other Clinician: Referring Knoah Nedeau: Otilio Miu Treating Junita Kubota/Extender: Frann Rider in Treatment: 37 Wound Status Wound Number: 5 Primary Pressure Ulcer Etiology: Wound Location: Right Gluteus Wound Open Wounding Event: Pressure Injury Status: Date Acquired: 09/10/2016 Comorbid Coronary Artery Disease, Type II Weeks Of Treatment: 3 History: Diabetes, Quadriplegia, Seizure Clustered Wound: No Disorder Wound Measurements Length: (cm) 2.9 Width: (cm)  2.5 Depth: (cm) 0.3 Area: (cm) 5.694 Volume: (cm) 1.708 % Reduction in Area: -0.7% % Reduction in Volume: -202.3% Epithelialization: None Undermining: Yes Starting Position (o'clock): 5 Ending Position (o'clock): 9 Maximum Distance: (cm) 0.6 Wound Description Classification: Unstageable/Unclassified Wound Margin: Flat and Intact Exudate Amount: Large Exudate Type: Serosanguineous Exudate Color: red, brown Foul Odor After Cleansing: Yes Due to Product Use: No Slough/Fibrino Yes Wound Bed Granulation Amount: Small (1-33%) Exposed Structure Granulation Quality: Hyper-granulation Fascia Exposed: No Necrotic Amount: Large (67-100%) Fat Layer (Subcutaneous Tissue) Exposed: Yes Necrotic Quality: Adherent Slough Tendon Exposed: No Muscle Exposed: No Joint Exposed: No Bone Exposed: No Periwound Skin Texture Texture Color No Abnormalities Noted: No No Abnormalities Noted: No Callus: No Atrophie Blanche: No [name], [name]. ([id number]) Crepitus: No Cyanosis: No Excoriation: Yes Ecchymosis: No Induration: No Erythema: No Rash: No Hemosiderin Staining: No Scarring: Yes Mottled: No Pallor: No Moisture Rubor: No No Abnormalities Noted: No Dry / Scaly: No Temperature / Pain Maceration: No Temperature: No Abnormality Tenderness on  Palpation: Yes Wound Preparation Ulcer Cleansing: Rinsed/Irrigated with Saline Topical Anesthetic Applied: Other: lidocaine 4%, Treatment Notes Wound #5 (Right Gluteus) 1. Cleansed with: Clean wound with Normal Saline 2. Anesthetic Topical Lidocaine 4% cream to wound bed prior to debridement 3. Peri-wound Care: Skin Prep 4. Dressing Applied: Aquacel Ag 5. Secondary Dressing Applied Bordered Foam Dressing Electronic Signature(s) Signed: 10/15/2016 4:59:52 PM By: Gretta Cool, BSN, RN, CWS, Kim RN, BSN Entered By: Gretta Cool, BSN, RN, CWS, Kim on 10/15/2016 15:21:15 [place], Leslye Peer  ([id number]) -------------------------------------------------------------------------------- Vitals Details Patient Name: [name], ARP. Date of Service: 10/15/2016 3:00 PM Medical Record Number: [id number] Patient Account Number: [id number] Date of Birth/Sex: November 18, 1950 (66 y.o. Male) Treating RN: Cornell Barman Primary Care Joleah Kosak: Otilio Miu Other Clinician: Referring Dannica Bickham: Otilio Miu Treating Jarrin Staley/Extender: Frann Rider in Treatment: 37 Vital Signs Time Taken: 15:12 Temperature (F): 97.5 Height (in): 70 Pulse (bpm): 70 Weight (lbs): 187 Respiratory Rate (breaths/min): 16 Body Mass Index (BMI): 26.8 Blood Pressure (mmHg): 129/46 Reference Range: 80 - 120 mg / dl Electronic Signature(s) Signed: 10/15/2016 4:59:52 PM By: Gretta Cool, BSN, RN, CWS, Kim RN, BSN Entered By: Gretta Cool, BSN, RN, CWS, Kim on 10/15/2016 15:14:54

## 2016-10-18 DIAGNOSIS — L89323 Pressure ulcer of left buttock, stage 3: Secondary | ICD-10-CM | POA: Diagnosis not present

## 2016-10-18 DIAGNOSIS — G8250 Quadriplegia, unspecified: Secondary | ICD-10-CM | POA: Diagnosis not present

## 2016-10-23 ENCOUNTER — Encounter: Payer: Medicare Other | Admitting: Internal Medicine

## 2016-10-23 DIAGNOSIS — S51011A Laceration without foreign body of right elbow, initial encounter: Secondary | ICD-10-CM | POA: Diagnosis not present

## 2016-10-23 DIAGNOSIS — S31829A Unspecified open wound of left buttock, initial encounter: Secondary | ICD-10-CM | POA: Diagnosis not present

## 2016-10-23 DIAGNOSIS — L89322 Pressure ulcer of left buttock, stage 2: Secondary | ICD-10-CM | POA: Diagnosis not present

## 2016-10-23 DIAGNOSIS — E119 Type 2 diabetes mellitus without complications: Secondary | ICD-10-CM | POA: Diagnosis not present

## 2016-10-23 DIAGNOSIS — I959 Hypotension, unspecified: Secondary | ICD-10-CM | POA: Diagnosis not present

## 2016-10-23 DIAGNOSIS — G8252 Quadriplegia, C1-C4 incomplete: Secondary | ICD-10-CM | POA: Diagnosis not present

## 2016-10-23 DIAGNOSIS — E785 Hyperlipidemia, unspecified: Secondary | ICD-10-CM | POA: Diagnosis not present

## 2016-10-23 DIAGNOSIS — L89310 Pressure ulcer of right buttock, unstageable: Secondary | ICD-10-CM | POA: Diagnosis not present

## 2016-10-23 DIAGNOSIS — Z8673 Personal history of transient ischemic attack (TIA), and cerebral infarction without residual deficits: Secondary | ICD-10-CM | POA: Diagnosis not present

## 2016-10-23 DIAGNOSIS — S14103S Unspecified injury at C3 level of cervical spinal cord, sequela: Secondary | ICD-10-CM | POA: Diagnosis not present

## 2016-10-23 DIAGNOSIS — S31000A Unspecified open wound of lower back and pelvis without penetration into retroperitoneum, initial encounter: Secondary | ICD-10-CM | POA: Diagnosis not present

## 2016-10-23 DIAGNOSIS — L03317 Cellulitis of buttock: Secondary | ICD-10-CM | POA: Diagnosis not present

## 2016-10-23 DIAGNOSIS — R569 Unspecified convulsions: Secondary | ICD-10-CM | POA: Diagnosis not present

## 2016-10-23 DIAGNOSIS — Z7984 Long term (current) use of oral hypoglycemic drugs: Secondary | ICD-10-CM | POA: Diagnosis not present

## 2016-10-23 DIAGNOSIS — S31819A Unspecified open wound of right buttock, initial encounter: Secondary | ICD-10-CM | POA: Diagnosis not present

## 2016-10-23 DIAGNOSIS — K219 Gastro-esophageal reflux disease without esophagitis: Secondary | ICD-10-CM | POA: Diagnosis not present

## 2016-10-23 DIAGNOSIS — L89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, stage 3: Secondary | ICD-10-CM | POA: Diagnosis not present

## 2016-10-24 DIAGNOSIS — L89130 Pressure ulcer of right lower back, unstageable: Secondary | ICD-10-CM | POA: Diagnosis not present

## 2016-10-24 NOTE — Progress Notes (Addendum)
LACEY, DOTSON ([id number]) Visit Report for 10/23/2016 Arrival Information Details Patient Name: CANNAN, BEECK. Date of Service: 10/23/2016 3:45 PM Medical Record Number: [id number] Patient Account Number: [id number] Date of Birth/Sex: 11/04/1950 (66 y.o. Male) Treating RN: Cornell Barman Primary Care Atharva Mirsky: Otilio Miu Other Clinician: Referring Oreta Soloway: Otilio Miu Treating Breasia Karges/Extender: Tito Dine in Treatment: 47 Visit Information History Since Last Visit Added or deleted any medications: No Patient Arrived: Wheel Chair Any new allergies or adverse reactions: No Arrival Time: 15:42 Had a fall or experienced change in No activities of daily living that may affect Accompanied By: wife risk of falls: Transfer Assistance: None Signs or symptoms of abuse/neglect since last No Patient Identification Verified: Yes visito Secondary Verification Process Yes Hospitalized since last visit: No Completed: Has Dressing in Place as Prescribed: Yes Patient Requires Transmission-Based No Pain Present Now: No Precautions: Patient Has Alerts: Yes Electronic Signature(s) Signed: 10/23/2016 5:02:20 PM By: Gretta Cool, BSN, RN, CWS, Kim RN, BSN Entered By: Gretta Cool, BSN, RN, CWS, Kim on 10/23/2016 15:42:50 [place], Leslye Peer ([id number]) -------------------------------------------------------------------------------- Clinic Level of Care Assessment Details Patient Name: Henkes, Arnez C. Date of Service: 10/23/2016 3:45 PM Medical Record Number: [id number] Patient Account Number: [id number] Date of Birth/Sex: 06-28-1950 (66 y.o. Male) Treating RN: Cornell Barman Primary Care Lorence Nagengast: Otilio Miu Other Clinician: Referring Deanta Mincey: Otilio Miu Treating Iretha Kirley/Extender: Tito Dine in Treatment: 79 Clinic Level of Care Assessment Items TOOL 4 Quantity Score '[]'$  - Use when only an EandM is performed on FOLLOW-UP visit 0 ASSESSMENTS - Nursing Assessment /  Reassessment '[]'$  - Reassessment of Co-morbidities (includes updates in patient status) 0 X - Reassessment of Adherence to Treatment Plan 1 5 ASSESSMENTS - Wound and Skin Assessment / Reassessment '[]'$  - Simple Wound Assessment / Reassessment - one wound 0 X - Complex Wound Assessment / Reassessment - multiple wounds 4 5 '[]'$  - Dermatologic / Skin Assessment (not related to wound area) 0 ASSESSMENTS - Focused Assessment '[]'$  - Circumferential Edema Measurements - multi extremities 0 '[]'$  - Nutritional Assessment / Counseling / Intervention 0 '[]'$  - Lower Extremity Assessment (monofilament, tuning fork, pulses) 0 '[]'$  - Peripheral Arterial Disease Assessment (using hand held doppler) 0 ASSESSMENTS - Ostomy and/or Continence Assessment and Care '[]'$  - Incontinence Assessment and Management 0 '[]'$  - Ostomy Care Assessment and Management (repouching, etc.) 0 PROCESS - Coordination of Care '[]'$  - Simple Patient / Family Education for ongoing care 0 X - Complex (extensive) Patient / Family Education for ongoing care 1 20 X - Staff obtains Programmer, systems, Records, Test Results / Process Orders 1 10 '[]'$  - Staff telephones HHA, Nursing Homes / Clarify orders / etc 0 '[]'$  - Routine Transfer to another Facility (non-emergent condition) 0 Cummings, Donis C. ([id number]) '[]'$  - Routine Hospital Admission (non-emergent condition) 0 '[]'$  - New Admissions / Biomedical engineer / Ordering NPWT, Apligraf, etc. 0 '[]'$  - Emergency Hospital Admission (emergent condition) 0 X - Simple Discharge Coordination 1 10 '[]'$  - Complex (extensive) Discharge Coordination 0 PROCESS - Special Needs '[]'$  - Pediatric / Minor Patient Management 0 '[]'$  - Isolation Patient Management 0 '[]'$  - Hearing / Language / Visual special needs 0 '[]'$  - Assessment of Community assistance (transportation, D/C planning, etc.) 0 '[]'$  - Additional assistance / Altered mentation 0 '[]'$  - Support Surface(s) Assessment (bed, cushion, seat, etc.) 0 INTERVENTIONS - Wound Cleansing /  Measurement '[]'$  - Simple Wound Cleansing - one wound 0 X - Complex Wound Cleansing - multiple wounds 4 5 X - Wound Imaging (  photographs - any number of wounds) 1 5 '[]'$  - Wound Tracing (instead of photographs) 0 '[]'$  - Simple Wound Measurement - one wound 0 X - Complex Wound Measurement - multiple wounds 4 5 INTERVENTIONS - Wound Dressings X - Small Wound Dressing one or multiple wounds 1 10 X - Medium Wound Dressing one or multiple wounds 3 15 '[]'$  - Large Wound Dressing one or multiple wounds 0 '[]'$  - Application of Medications - topical 0 '[]'$  - Application of Medications - injection 0 INTERVENTIONS - Miscellaneous '[]'$  - External ear exam 0 Thoreson, Bartolo C. ([id number]) '[]'$  - Specimen Collection (cultures, biopsies, blood, body fluids, etc.) 0 '[]'$  - Specimen(s) / Culture(s) sent or taken to Lab for analysis 0 '[]'$  - Patient Transfer (multiple staff / Civil Service fast streamer / Similar devices) 0 '[]'$  - Simple Staple / Suture removal (25 or less) 0 '[]'$  - Complex Staple / Suture removal (26 or more) 0 '[]'$  - Hypo / Hyperglycemic Management (close monitor of Blood Glucose) 0 '[]'$  - Ankle / Brachial Index (ABI) - do not check if billed separately 0 X - Vital Signs 1 5 Has the patient been seen at the hospital within the last three years: Yes Total Score: 170 Level Of Care: New/Established - Level 5 Electronic Signature(s) Signed: 10/23/2016 5:02:20 PM By: Gretta Cool, BSN, RN, CWS, Kim RN, BSN Entered By: Gretta Cool, BSN, RN, CWS, Kim on 10/23/2016 [place], Leslye Peer ([id number]) -------------------------------------------------------------------------------- Encounter Discharge Information Details Patient Name: [place], David C. Date of Service: 10/23/2016 3:45 PM Medical Record Number: [id number] Patient Account Number: [id number] Date of Birth/Sex: [birth year] (66 y.o. Male) Treating RN: Cornell Barman Primary Care Shar Paez: Otilio Miu Other Clinician: Referring Jernee Murtaugh: Otilio Miu Treating Nichols Corter/Extender:  Tito Dine in Treatment: 54 Encounter Discharge Information Items Discharge Pain Level: 0 Discharge Condition: Stable Ambulatory Status: Ambulatory Discharge Destination: Home Transportation: Private Auto Accompanied By: wife Schedule Follow-up Appointment: Yes Medication Reconciliation completed Yes and provided to Patient/Care Frank Pilger: Patient Clinical Summary of Care: Declined Electronic Signature(s) Signed: 10/23/2016 5:02:20 PM By: Gretta Cool, BSN, RN, CWS, Kim RN, BSN Entered By: Gretta Cool, BSN, RN, CWS, Kim on 10/23/2016 17:01:14 [place], Leslye Peer ([id number]) -------------------------------------------------------------------------------- Lower Extremity Assessment Details Patient Name: Ferrera, Raliegh C. Date of Service: 10/23/2016 3:45 PM Medical Record Number: [id number] Patient Account Number: [id number] Date of Birth/Sex: 1950-05-27 (66 y.o. Male) Treating RN: Cornell Barman Primary Care Cloma Rahrig: Otilio Miu Other Clinician: Referring Kenlei Safi: Otilio Miu Treating Shunte Senseney/Extender: Tito Dine in Treatment: 36 Electronic Signature(s) Signed: 10/23/2016 5:02:20 PM By: Gretta Cool, BSN, RN, CWS, Kim RN, BSN Entered By: Gretta Cool, BSN, RN, CWS, Kim on 10/23/2016 16:43:55 [place], Leslye Peer ([id number]) -------------------------------------------------------------------------------- Multi Wound Chart Details Patient Name: ADEEB, KONECNY C. Date of Service: 10/23/2016 3:45 PM Medical Record Number: [id number] Patient Account Number: [id number] Date of Birth/Sex: 1950/11/30 (66 y.o. Male) Treating RN: Cornell Barman Primary Care Walta [place]: Otilio Miu Other Clinician: Referring Daulton Harbaugh: Otilio Miu Treating Yassir Enis/Extender: Ricard Dillon Weeks in Treatment: 38 Vital Signs Height(in): 70 Pulse(bpm): 83 Weight(lbs): 187 Blood Pressure 106/62 (mmHg): Body Mass Index(BMI): 27 Temperature(F): 97.5 Respiratory Rate 16 (breaths/min): Photos: [1:No  Photos] [4:No Photos] [5:No Photos] Wound Location: [1:Right, Proximal Back] [4:Left Gluteus] [5:Right Gluteus] Wounding Event: [1:Pressure Injury] [4:Pressure Injury] [5:Pressure Injury] Primary Etiology: [1:Pressure Ulcer] [4:Pressure Ulcer] [5:Pressure Ulcer] Comorbid History: [1:N/A] [4:N/A] [5:Coronary Artery Disease, Type II Diabetes, Quadriplegia, Seizure Disorder] Date Acquired: [1:11/24/2015] [4:07/09/2016] [5:09/10/2016] Weeks of Treatment: [1:39] [4:10] [5:5] Wound Status: [1:Open] [4:Open] [5:Open] Measurements L x W x D 0.8x0.6x0.1 [4:0.7x0.8x0.1] [  5:2.9x2.4x0.3] (cm) Area (cm) : [1:0.377] [4:0.44] [5:5.466] Volume (cm) : [1:0.038] [4:0.044] [5:1.64] % Reduction in Area: [1:95.30%] [4:-180.30%] [5:3.30%] % Reduction in Volume: 95.30% [4:-175.00%] [5:-190.30%] Starting Position 1 [5:5] (o'clock): Ending Position 1 [5:8] (o'clock): Maximum Distance 1 [5:0.8] (cm): Undermining: [1:N/A] [4:N/A] [5:Yes] Classification: [1:Category/Stage II] [4:Category/Stage II] [5:Unstageable/Unclassified] Exudate Amount: [1:N/A] [4:N/A] [5:Large] Exudate Type: [1:N/A] [4:N/A] [5:Serosanguineous] Exudate Color: [1:N/A] [4:N/A] [5:red, brown] Foul Odor After [1:N/A] [4:N/A] [5:Yes] Cleansing: Pickler, Jaqualyn C. ([id number]) Odor Anticipated Due to N/A N/A No Product Use: Wound Margin: N/A N/A Flat and Intact Granulation Amount: N/A N/A Small (1-33%) Necrotic Amount: N/A N/A Large (67-100%) Epithelialization: N/A N/A None Periwound Skin Texture: No Abnormalities Noted No Abnormalities Noted Excoriation: Yes Scarring: Yes Induration: No Callus: No Crepitus: No Rash: No Periwound Skin No Abnormalities Noted No Abnormalities Noted Maceration: No Moisture: Dry/Scaly: No Periwound Skin Color: No Abnormalities Noted No Abnormalities Noted Atrophie Blanche: No Cyanosis: No Ecchymosis: No Erythema: No Hemosiderin Staining: No Mottled: No Pallor: No Rubor: No Temperature: N/A N/A No  Abnormality Tenderness on No No Yes Palpation: Wound Preparation: N/A N/A Ulcer Cleansing: Rinsed/Irrigated with Saline Topical Anesthetic Applied: Other: lidocaine 4% Treatment Notes Electronic Signature(s) Signed: 10/23/2016 5:02:20 PM By: Gretta Cool, BSN, RN, CWS, Kim RN, BSN Entered By: Gretta Cool, BSN, RN, CWS, Kim on 10/23/2016 16:44:22 [place], Leslye Peer ([id number]) -------------------------------------------------------------------------------- Multi-Disciplinary Care Plan Details Patient Name: JAVID, KEMLER. Date of Service: 10/23/2016 3:45 PM Medical Record Number: [id number] Patient Account Number: [id number] Date of Birth/Sex: 1951/01/08 (66 y.o. Male) Treating RN: Cornell Barman Primary Care Calem Cocozza: Otilio Miu Other Clinician: Referring Shaiann Mcmanamon: Otilio Miu Treating [place]g [place]/Extender: Ricard Dillon Weeks in Treatment: 39 Active Inactive ` Nutrition Nursing Diagnoses: Imbalanced nutrition Impaired glucose control: actual or potential Goals: Patient/caregiver agrees to and verbalizes understanding of need to use nutritional supplements and/or vitamins as prescribed Date Initiated: 01/24/2016 Target Resolution Date: 03/27/2016 Goal Status: Active Patient/caregiver will maintain therapeutic glucose control Date Initiated: 01/24/2016 Target Resolution Date: 03/27/2016 Goal Status: Active Interventions: Assess patient nutrition upon admission and as needed per policy Provide education on elevated blood sugars and impact on wound healing Notes: ` Orientation to the Wound Care Program Nursing Diagnoses: Knowledge deficit related to the wound healing center program Goals: Patient/caregiver will verbalize understanding of the [place] Date Initiated: 01/24/2016 Target Resolution Date: 03/27/2016 Goal Status: Active Interventions: Provide education on orientation to the wound center Notes: EINER, MEALS ([id number]) ` Pain, Acute or  Chronic Nursing Diagnoses: Pain, acute or chronic: actual or potential Potential alteration in comfort, pain Goals: Patient will verbalize adequate pain control and receive pain control interventions during procedures as needed Date Initiated: 01/24/2016 Target Resolution Date: 03/27/2016 Goal Status: Active Patient/caregiver will verbalize adequate pain control between visits Date Initiated: 01/24/2016 Target Resolution Date: 03/27/2016 Goal Status: Active Patient/caregiver will verbalize comfort level met Date Initiated: 01/24/2016 Target Resolution Date: 03/27/2016 Goal Status: Active Interventions: Assess comfort goal upon admission Complete pain assessment as per visit requirements Notes: ` Pressure Nursing Diagnoses: Knowledge deficit related to management of pressures ulcers Potential for impaired tissue integrity related to pressure, friction, moisture, and shear Goals: Patient will remain free from development of additional pressure ulcers Date Initiated: 01/24/2016 Target Resolution Date: 03/27/2016 Goal Status: Active Interventions: Assess: immobility, friction, shearing, incontinence upon admission and as needed Assess offloading mechanisms upon admission and as needed Assess potential for pressure ulcer upon admission and as needed Provide education on pressure ulcers Notes: TREQUAN, MARSOLEK ([id number]) Wound/Skin Impairment Nursing  Diagnoses: Impaired tissue integrity Goals: Ulcer/skin breakdown will have a volume reduction of 30% by week 4 Date Initiated: 01/24/2016 Target Resolution Date: 03/27/2016 Goal Status: Active Ulcer/skin breakdown will have a volume reduction of 50% by week 8 Date Initiated: 01/24/2016 Target Resolution Date: 03/27/2016 Goal Status: Active Ulcer/skin breakdown will have a volume reduction of 80% by week 12 Date Initiated: 01/24/2016 Target Resolution Date: 03/27/2016 Goal Status: Active Interventions: Assess  patient/caregiver ability to perform ulcer/skin care regimen upon admission and as needed Assess ulceration(s) every visit Notes: Electronic Signature(s) Signed: 10/23/2016 5:02:20 PM By: Gretta Cool, BSN, RN, CWS, Kim RN, BSN Entered By: Gretta Cool, BSN, RN, CWS, Kim on 10/23/2016 16:44:02 [place], Leslye Peer ([id number]) -------------------------------------------------------------------------------- Non-Wound Condition Assessment Details Patient Name: Antenucci, Thelonious C. Date of Service: 10/23/2016 3:45 PM Medical Record Number: [id number] Patient Account Number: [id number] Date of Birth/Sex: 04-22-1950 (66 y.o. Male) Treating RN: Cornell Barman Primary Care Xochitl Egle: Otilio Miu Other Clinician: Referring Diogenes Whirley: Otilio Miu Treating Tayshawn Purnell/Extender: Ricard Dillon Weeks in Treatment: 60 Non-Wound Condition: Condition: Suspected Deep Tissue Injury Location: Other: sacrum Side: Periwound Skin Texture Texture Color No Abnormalities Noted: No No Abnormalities Noted: No Induration: Yes Ecchymosis: Yes Erythema: Yes Moisture Erythema Location: Circumferential No Abnormalities Noted: No Dry / Scaly: Yes Electronic Signature(s) Signed: 10/23/2016 5:02:20 PM By: Gretta Cool, BSN, RN, CWS, Kim RN, BSN Entered By: Gretta Cool, BSN, RN, CWS, Kim on 10/23/2016 15:57:01 [place], Leslye Peer ([id number]) -------------------------------------------------------------------------------- Pain Assessment Details Patient Name: [name] Date of Service: 10/23/2016 3:45 PM Medical Record Number: [id number] Patient Account Number: [id number] Date of Birth/Sex: Jul 26, 1950 (66 y.o. Male) Treating RN: Cornell Barman Primary Care Nakai Pollio: Otilio Miu Other Clinician: Referring Roshan Roback: Otilio Miu Treating Tiffiny Worthy/Extender: Tito Dine in Treatment: 3 Active Problems Location of Pain Severity and Description of Pain Patient Has Paino No Site Locations With Dressing Change: No Pain Management  and Medication Current Pain Management: Electronic Signature(s) Signed: 10/23/2016 5:02:20 PM By: Gretta Cool, BSN, RN, CWS, Kim RN, BSN Entered By: Gretta Cool, BSN, RN, CWS, Kim on 10/23/2016 15:42:58 [place], Leslye Peer ([id number]) -------------------------------------------------------------------------------- Patient/Caregiver Education Details Patient Name: [name]. Date of Service: 10/23/2016 3:45 PM Medical Record Number: [id number] Patient Account Number: [id number] Date of Birth/Gender: 09-28-1950 (66 y.o. Male) Treating RN: Cornell Barman Primary Care Physician: Otilio Miu Other Clinician: Referring Physician: Otilio Miu Treating Physician/Extender: Tito Dine in Treatment: 59 Education Assessment Education Provided To: Patient Education Topics Provided Pressure: Pressure Ulcers: Care and Offloading, Preventing Pressure Ulcers, Other: turn and reposition Handouts: every 2 hours Methods: Demonstration, Explain/Verbal Responses: State content correctly Electronic Signature(s) Signed: 10/23/2016 5:02:20 PM By: Gretta Cool, BSN, RN, CWS, Kim RN, BSN Entered By: Gretta Cool, BSN, RN, CWS, Kim on 10/23/2016 17:01:49 [place], Leslye Peer ([id number]) -------------------------------------------------------------------------------- Wound Assessment Details Patient Name: EIDAN, MUELLNER C. Date of Service: 10/23/2016 3:45 PM Medical Record Number: [id number] Patient Account Number: [id number] Date of Birth/Sex: March 03, 1950 (66 y.o. Male) Treating RN: Cornell Barman Primary Care Emiley Digiacomo: Otilio Miu Other Clinician: Referring Anson Peddie: Otilio Miu Treating Debarah Mccumbers/Extender: Ricard Dillon Weeks in Treatment: 39 Wound Status Wound Number: 1 Primary Pressure Ulcer Etiology: Wound Location: Right Back - Proximal Wound Open Wounding Event: Pressure Injury Status: Date Acquired: 11/24/2015 Comorbid Coronary Artery Disease, Type II Weeks Of Treatment: 39 History: Diabetes,  Quadriplegia, Seizure Clustered Wound: No Disorder Photos Wound Measurements Length: (cm) 0.8 Width: (cm) 0.6 Depth: (cm) 0.1 Area: (cm) 0.377 Volume: (cm) 0.038 % Reduction in Area: 95.3% % Reduction in Volume: 95.3% Epithelialization: None Tunneling: No Undermining:  No Wound Description Classification: Category/Stage II Wound Margin: Flat and Intact Exudate Amount: Medium Exudate Type: Serosanguineous Exudate Color: red, brown Foul Odor After Cleansing: No Slough/Fibrino Yes Wound Bed Granulation Amount: Small (1-33%) Exposed Structure Granulation Quality: Pink Fascia Exposed: No Necrotic Amount: Large (67-100%) Fat Layer (Subcutaneous Tissue) Exposed: Yes Necrotic Quality: Eschar Tendon Exposed: No Muscle Exposed: No Joint Exposed: No Bone Exposed: No Muise, Esdras C. ([id number]) Periwound Skin Texture Texture Color No Abnormalities Noted: No No Abnormalities Noted: No Induration: Yes Erythema: Yes Erythema Location: Circumferential Moisture No Abnormalities Noted: No Temperature / Pain Temperature: No Abnormality Tenderness on Palpation: Yes Wound Preparation Ulcer Cleansing: Rinsed/Irrigated with Saline Topical Anesthetic Applied: Other: lidocaine 4%, Treatment Notes Wound #1 (Right, Proximal Back) 1. Cleansed with: Clean wound with Normal Saline 2. Anesthetic Topical Lidocaine 4% cream to wound bed prior to debridement 4. Dressing Applied: Aquacel Ag 5. Secondary Dressing Applied Bordered Foam Dressing Notes Prisma Ag back Electronic Signature(s) Signed: 11/06/2016 8:11:33 AM By: Gretta Cool, BSN, RN, CWS, Kim RN, BSN Previous Signature: 10/23/2016 5:02:20 PM Version By: Gretta Cool, BSN, RN, CWS, Kim RN, BSN Entered By: Gretta Cool, BSN, RN, CWS, Kim on 10/25/2016 16:10:35 [place], Leslye Peer ([id number]) -------------------------------------------------------------------------------- Wound Assessment Details Patient Name: MATTOX, SCHORR C. Date of Service:  10/23/2016 3:45 PM Medical Record Number: [id number] Patient Account Number: [id number] Date of Birth/Sex: [birth year] (66 y.o. Male) Treating RN: Cornell Barman Primary Care Leovardo Thoman: Otilio Miu Other Clinician: Referring Deannah Rossi: Otilio Miu Treating Chares Slaymaker/Extender: Ricard Dillon Weeks in Treatment: 46 Wound Status Wound Number: 4 Primary Etiology: Pressure Ulcer Wound Location: Left Gluteus Wound Status: Open Wounding Event: Pressure Injury Date Acquired: 07/09/2016 Weeks Of Treatment: 10 Clustered Wound: No Photos Photo Uploaded By: Gretta Cool, BSN, RN, CWS, Kim on 10/23/2016 17:03:20 Wound Measurements Length: (cm) 0.7 Width: (cm) 0.8 Depth: (cm) 0.1 Area: (cm) 0.44 Volume: (cm) 0.044 % Reduction in Area: -180.3% % Reduction in Volume: -175% Wound Description Classification: Category/Stage II Periwound Skin Texture Texture Color No Abnormalities Noted: No No Abnormalities Noted: No Moisture No Abnormalities Noted: No Treatment Notes Wound #4 (Left Gluteus) 1. Cleansed with: Clean wound with Normal Saline 2. Anesthetic Ruffino, Iden C. ([id number]) Topical Lidocaine 4% cream to wound bed prior to debridement 4. Dressing Applied: Aquacel Ag 5. Secondary Dressing Applied Bordered Foam Dressing Notes Prisma Ag back Electronic Signature(s) Signed: 10/23/2016 5:02:20 PM By: Gretta Cool, BSN, RN, CWS, Kim RN, BSN Entered By: Gretta Cool, BSN, RN, CWS, Kim on 10/23/2016 15:53:49 [place], Leslye Peer ([id number]) -------------------------------------------------------------------------------- Wound Assessment Details Patient Name: Bozard, Kessler C. Date of Service: 10/23/2016 3:45 PM Medical Record Number: [id number] Patient Account Number: [id number] Date of Birth/Sex: 1950-08-28 (66 y.o. Male) Treating RN: Cornell Barman Primary Care Kanai Hilger: Otilio Miu Other Clinician: Referring Maryann Mccall: Otilio Miu Treating [place] Paiz/Extender: Ricard Dillon Weeks in Treatment:  39 Wound Status Wound Number: 5 Primary Pressure Ulcer Etiology: Wound Location: Right Gluteus Wound Open Wounding Event: Pressure Injury Status: Date Acquired: 09/10/2016 Comorbid Coronary Artery Disease, Type II Weeks Of Treatment: 5 History: Diabetes, Quadriplegia, Seizure Clustered Wound: No Disorder Photos Photo Uploaded By: Gretta Cool, BSN, RN, CWS, Kim on 10/23/2016 17:03:20 Wound Measurements Length: (cm) 2.9 % Reduction in Width: (cm) 2.4 % Reduction in Depth: (cm) 0.3 Epithelializati Area: (cm) 5.466 Undermining: Volume: (cm) 1.64 Starting Po Ending Posit Maximum Dist Area: 3.3% Volume: -190.3% on: None Yes sition (o'clock): 5 ion (o'clock): 8 ance: (cm) 0.8 Wound Description Classification: Unstageable/Unclassified Wound Margin: Flat and Intact Exudate Amount: Large Exudate Type: Serosanguineous Exudate Color: red, brown Foul  Odor After Cleansing: Yes Due to Product Use: No Slough/Fibrino Yes Wound Bed Granulation Amount: Small (1-33%) Exposed Structure Granulation Quality: Hyper-granulation Fascia Exposed: No Necrotic Amount: Large (67-100%) Fat Layer (Subcutaneous Tissue) Exposed: Yes Jacobsen, Jamori C. ([id number]) Necrotic Quality: Adherent Slough Tendon Exposed: No Muscle Exposed: No Joint Exposed: No Bone Exposed: No Periwound Skin Texture Texture Color No Abnormalities Noted: No No Abnormalities Noted: No Callus: No Atrophie Blanche: No Crepitus: No Cyanosis: No Excoriation: Yes Ecchymosis: No Induration: No Erythema: No Rash: No Hemosiderin Staining: No Scarring: Yes Mottled: No Pallor: No Moisture Rubor: No No Abnormalities Noted: No Dry / Scaly: No Temperature / Pain Maceration: No Temperature: No Abnormality Tenderness on Palpation: Yes Wound Preparation Ulcer Cleansing: Rinsed/Irrigated with Saline Topical Anesthetic Applied: Other: lidocaine 4%, Treatment Notes Wound #5 (Right Gluteus) 1. Cleansed with: Clean  wound with Normal Saline 2. Anesthetic Topical Lidocaine 4% cream to wound bed prior to debridement 4. Dressing Applied: Aquacel Ag 5. Secondary Dressing Applied Bordered Foam Dressing Notes Prisma Ag back Electronic Signature(s) Signed: 10/23/2016 5:02:20 PM By: Elliot Gurney, BSN, RN, CWS, Kim RN, BSN Entered By: Elliot Gurney, BSN, RN, CWS, Kim on 10/23/2016 15:54:20 [place], [name] ([id number]) -------------------------------------------------------------------------------- Wound Assessment Details Patient Name: SANDER, REMEDIOS C. Date of Service: 10/23/2016 3:45 PM Medical Record Number: [id number] Patient Account Number: [id number] Date of Birth/Sex: 1950-10-09 (66 y.o. Male) Treating RN: Huel Coventry Primary Care Vaden Becherer: Elizabeth Sauer Other Clinician: Referring Brenlee Koskela: Elizabeth Sauer Treating Allysia Ingles/Extender: Maxwell Caul Weeks in Treatment: 39 Wound Status Wound Number: 6 Primary Pressure Ulcer Etiology: Wound Location: Sacrum - Medial Wound Open Wounding Event: Pressure Injury Status: Date Acquired: 10/16/2016 Comorbid Coronary Artery Disease, Type II Weeks Of Treatment: 0 History: Diabetes, Quadriplegia, Seizure Clustered Wound: No Disorder Photos Wound Measurements Length: (cm) 4.5 Width: (cm) 6 Depth: (cm) 0.1 Area: (cm) 21.206 Volume: (cm) 2.121 % Reduction in Area: % Reduction in Volume: Epithelialization: Large (67-100%) Tunneling: No Undermining: No Wound Description Classification: Category/Stage I Foul Odor Afte Wound Margin: Indistinct, nonvisible Slough/Fibrino Exudate Amount: Medium Exudate Type: Serous Exudate Color: amber r Cleansing: No No Wound Bed Granulation Amount: None Present (0%) Exposed Structure Necrotic Amount: None Present (0%) Fascia Exposed: No Fat Layer (Subcutaneous Tissue) Exposed: No Tendon Exposed: No Muscle Exposed: No Joint Exposed: No Bone Exposed: No Greenberger, Arnez C. ([id number]) Limited to Skin  Breakdown Periwound Skin Texture Texture Color No Abnormalities Noted: No No Abnormalities Noted: No Callus: No Atrophie Blanche: No Crepitus: No Cyanosis: No Excoriation: No Ecchymosis: Yes Induration: No Erythema: Yes Rash: No Erythema Location: Circumferential Scarring: No Erythema Measurement: Measured 6 cm Moisture Hemosiderin Staining: No No Abnormalities Noted: No Mottled: No Dry / Scaly: No Pallor: No Maceration: No Rubor: No Temperature / Pain Temperature: No Abnormality Tenderness on Palpation: Yes Wound Preparation Ulcer Cleansing: Rinsed/Irrigated with Saline Treatment Notes Wound #6 (Medial Sacrum) 1. Cleansed with: Clean wound with Normal Saline 2. Anesthetic Topical Lidocaine 4% cream to wound bed prior to debridement 4. Dressing Applied: Aquacel Ag 5. Secondary Dressing Applied Bordered Foam Dressing Notes Prisma Ag back Electronic Signature(s) Signed: 10/23/2016 5:02:20 PM By: Elliot Gurney, BSN, RN, CWS, Kim RN, BSN Entered By: Elliot Gurney, BSN, RN, CWS, Kim on 10/23/2016 16:52:57 Alcaide, [name] ([id number]) -------------------------------------------------------------------------------- Vitals Details Patient Name: [name] Date of Service: 10/23/2016 3:45 PM Medical Record Number: [id number] Patient Account Number: [id number] Date of Birth/Sex: 09-07-1950 (66 y.o. Male) Treating RN: Huel Coventry Primary Care Chiquita Heckert: Elizabeth Sauer Other Clinician: Referring Suttyn Cryder: Elizabeth Sauer Treating Illyria Sobocinski/Extender: Maxwell Caul  Weeks in Treatment: 39 Vital Signs Time Taken: 15:43 Temperature (F): 97.5 Height (in): 70 Pulse (bpm): 83 Weight (lbs): 187 Respiratory Rate (breaths/min): 16 Body Mass Index (BMI): 26.8 Blood Pressure (mmHg): 106/62 Reference Range: 80 - 120 mg / dl Electronic Signature(s) Signed: 10/23/2016 5:02:20 PM By: Gretta Cool, BSN, RN, CWS, Kim RN, BSN Entered By: Gretta Cool, BSN, RN, CWS, Kim on 10/23/2016 15:43:24

## 2016-10-25 NOTE — Progress Notes (Addendum)
RIELEY, HAUSMAN ([id number]) Visit Report for 10/23/2016 HPI Details Patient Name: [name], [name]. Date of Service: 10/23/2016 3:45 PM Medical Record Number: [id number] Patient Account Number: [id number] Date of Birth/Sex: 12/24/1950 (66 y.o. 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 He has lower extremity sensation and can move his legs but has no/limited control over his arms. His wife accompanies him today and states that in the late spring or early summer of 2017 the patient became very depressed. He refused the refused to mobilize and he developed several pressure ulcers on his back. Most of these have healed however they have a recalcitrant area over the right scapula. They've been using Santyl on this for at least the last month. In terms of depression the patient is doing better now on an antidepressant. He saw his primary physician on 01/12/16 at which time there was apparently green drainage coming out of this area [Dr. Deana Jones]. Dr. Yetta Barre works in the [facility]. He has completed this Septra. Otherwise looking through [facility] notes that he has a history of seizures. He also had a stroke in 2008 he follows with neurology. He also has type 2 diabetes on Glucophage, hyperlipidemia and gastroesophageal reflux. He takes Plavix for stroke prevention and Keppra for seizure prophylaxis. 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 02/08/16; this is a patient with a pressure ulcer over the right scapula. His wife has been doing the dressing with Santyl and  border foam change every second day. When he arrived here 2 weeks ago we did a fairly extensive mechanical debridement. We are asked medical modalities to go out to the home and see what they might be eligible for in terms of pressure-relief surfaces [level 2] but the wife states that they have not heard from them. 03/20/15; this is a patient we haven't seen in 5-6 weeks. This was largely due to transportation issues. They've been using Santyl and border foam changing every second day for a pressure injury over the right scapula. The patient has a C3-C4 spinal injury. We had also asked for a review by the people who supplied DME to look at his wheelchair cushion, mattress etc. I don't know that this ever happened. In the meantime the wound has done remarkably well current measurements 1.8 x 2 x 0.1 04/03/16; the patient's dimensions have gone up to 3 cm in diameter quite a deterioration from last time. He is also complaining of pain in this area which is new. 04/10/16; 3 x 1.5 x 0.1. No difference from last week. Culture I did last week was negative he has completed antibiotics. 04/24/16 2.4 x 2 x 0.1; wound generally looks smaller. Middle area that I had to debrided last time looks healthier. His son is fashioned a large piece of foam cut out where the patient's wound with hit the back of his wheelchair 05/01/16; wound is a same size however the surface of this looks better. The middle innkeeper area required a repeat debridement 05/15/16; wound is down and dimensions granulation still looks healthy. The medial aspect of this wound is now the deeper Divot. We'll see  how this responds to further healing. We're using Hydrofera Blue 06/05/16; Wound 1.7x1.1x0.1 still using hyudrofera blue Beaubien, Fanny BienRICHARD C. ([id number]) 06/26/16; the patient arrives back in clinic after a three-week hiatus. He was hospitalized from 06/09/16 through 06/10/16. He was found to be confused. CT scan of the head showed nothing really  acute. His sodium was low at 131. He was rehydrated. He was felt to have a UTI and given antibiotics and antibiotics at discharge although his final culture result only showed multiple organisms. His wife says today that at the time of the hospitalization they discovered a large intact blister over the large aspect of his left calcaneus. This is recently ruptured. The wound on his right scapula is somewhat larger. More his wife is concerned about his current status. She states that he is still confused sleeps for long periods. He is not having fever chills cough or diarrhea [1 loose bowel movement per day]. He continues to have a suprapubic catheter. She notes that he is not eating and drinking well. Patient states he just does not want to eat. He does not feel nauseated or vomit. 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 Admission sodium was 131 at discharge 139 BUN 22 and 1.22 at admission, 17 and 1 at discharge. 07/03/16; patient's mental status is back to normal. He has a new wound on the right elbow caused by traumatizing his elbow against the wall apparently at the dermatologist office last week. We continue with the original wound on the right scapula and the wound from 2 weeks ago on his left heel. We have been using silver alginate to the area on the heel. 08/14/16; patient has not been here in almost 6 weeks. When he was here last time he had the pressure ulcer on the right scapula, atraumatic wound on the right lateral elbow and an area on his left heel. His wife states that at one point all of these were healed and she didn't really feel he needed to come back here. Since then the patient has developed a reopening of the area on the right scapula, a stage II wound on the left buttock and a reopening of the area on the right lateral elbow. As usual his wife as a litany of complaints against home  health, she is dismissing or is going to dismiss well care. She tells us she has a long list of supplies at home already for some reason they were not felt to be eligible for a group 2 or 3 surface although they have a hospital bed at home but no offloading surface 08/28/16; the patient arrived today unfortunately incontinent of stool in spite of this the wounds all appear to be better including the right scapular area, his left buttock's left heel and the right lateral elbow. 09/18/16; this is a patient who arrived today for follow-up of a pressure area over his right scapula area, left buttock and there was an area on his right lateral elbow. He arrived in clinic today with a worrisome area over the right initial tuberosity/right buttock. This had a necrotic surface with draining purulence. He has not been systemically unwell. The area over the left elbow healed. He arrived in clinic today with dressing that hadn't been changed over the scapula for 2 or 3 days per her intake. His wife is previously fired home health 09/25/16;; the last time the patient was here he arrived  with a new grossly infected wound over the right ischial tuberosity. Culture I did not show a specific pathogen. I did think this required admission to hospital and he was admitted from 09/18/16 through 09/20/16. Initially given bank and Zosyn but then discharged with 5 days' worth of Augmentin. An MRI was done that did not show osteomyelitis of the right ischial tuberosity however it did show cellulitis and possible myositis. He also has wounds on the left ischial tuberosity and the area over the right scapular area. 10/04/16 on evaluation today patient appears to be doing better in regard to his back wound and is stable in regard to the gluteal wounds. There does not appear to be any evidence of significant infection at this point. He is tolerating the dressing changes currently. His wife has been performing the dressing  changes. Nonetheless he does have some discomfort especially in the gluteal areas although he did not specifically rate the discomfort there were times during evaluation where he flinched and it was obvious it was bothering him. No fevers, chills, nausea, or vomiting noted at this time. 10/17/16; the patient has bilateral ischial tuberosity wounds right greater than left. The left seems to be making good progress wears the right has not really changed that much and dimensions. There is also Zoeller, Silus C. ([id number]) some threatened area on the right side around the wound circumference. Equally worrisome there is a DTI over the lower sacral area however this is not open as of yet. His area over the left scapula which is the chronic wound he was coming to the clinic continues to make excellent progress The patient comes in today wanting to talk about getting back in his wheelchair. He is very bored lying in bed at home Electronic Signature(s) Signed: 10/24/2016 7:49:24 AM By: Baltazar Najjar MD Entered By: Baltazar Najjar on 10/23/2016 16:41:14 Busch, [name] ([id number]) -------------------------------------------------------------------------------- Physical Exam Details Patient Name: [name], [name] Date of Service: 10/23/2016 3:45 PM Medical Record Number: [id number] Patient Account Number: [id number] Date of Birth/Sex: 06-Nov-1950 (66 y.o. Male) Treating RN: Huel Coventry Primary Care Provider: Elizabeth Sauer Other Clinician: Referring Provider: Elizabeth Sauer Treating Provider/Extender: Maxwell Caul Weeks in Treatment: 55 Constitutional Sitting or standing Blood Pressure is within target range for patient.. Pulse regular and within target range for patient.Marland Kitchen Respirations regular, non-labored and within target range.. Temperature is normal and within the target range for the patient.Marland Kitchen appears in no distress. Eyes Conjunctivae clear. No discharge. Respiratory Respiratory effort  is easy and symmetric bilaterally. Rate is normal at rest and on room air.. Cardiovascular no dehydration. Lymphatic Nonpalpable inguinal area bilateral. Integumentary (Hair, Skin) Skin and subcutaneous tissue without rashes, lesions, discoloration or ulcers. No evidence of fungal disease. Hair and skin color texture normal and healthy in appearance.Marland Kitchen Psychiatric Patient appears depressed today.. Notes Wound exam; the area on the upper scapular area on the right is very clean and appears to be progressing towards [place]. No debridement is required oThe larger area on the right ischial is larger and there is some reddened area on the circumference of roughly 1:00. oThe area on the left is smaller and looks healthy. Electronic Signature(s) Signed: 10/24/2016 7:49:24 AM By: Baltazar Najjar MD Entered By: Baltazar Najjar on 10/24/2016 07:44:50 Maroney, [name] ([id number]) -------------------------------------------------------------------------------- Physician Orders Details Patient Name: ORLAN, AVERSA C. Date of Service: 10/23/2016 3:45 PM Medical Record Number: [id number] Patient Account Number: [id number] Date of Birth/Sex: 12/29/1950 (66 y.o. Male) Treating RN: Huel Coventry Primary Care Provider: Elizabeth Sauer  Other Clinician: Referring Provider: Elizabeth Sauer Treating Provider/Extender: Altamese Rolette in Treatment: 24 Verbal / Phone Orders: No Diagnosis Coding ICD-10 Coding Code Description 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, stage 3 S14.103S Unspecified injury at C3 level of cervical spinal cord, sequela S51.011A Laceration without foreign body of right elbow, initial encounter L89.322 Pressure ulcer of left buttock, stage 2 L89.310 Pressure ulcer of right buttock, unstageable L03.317 Cellulitis of buttock Wound Cleansing Wound #1 Right,Proximal Back o Clean wound with Normal Saline. o Cleanse wound with mild soap and water Wound #4 Left Gluteus o Clean  wound with Normal Saline. o Cleanse wound with mild soap and water o Cleanse wound with mild soap and water Wound #5 Right Gluteus o Clean wound with Normal Saline. o Cleanse wound with mild soap and water o Cleanse wound with mild soap and water Wound #6 Medial Sacrum o Clean wound with Normal Saline. o Cleanse wound with mild soap and water o Cleanse wound with mild soap and water Anesthetic Wound #1 Right,Proximal Back o Topical Lidocaine 4% cream applied to wound bed prior to debridement Wound #4 Left Gluteus o Topical Lidocaine 4% cream applied to wound bed prior to debridement JAQUALYN, JUDAY ([id number]) Wound #5 Right Gluteus o Topical Lidocaine 4% cream applied to wound bed prior to debridement Wound #6 Medial Sacrum o Topical Lidocaine 4% cream applied to wound bed prior to debridement Skin Barriers/Peri-Wound Care Wound #1 Right,Proximal Back o Skin Prep Wound #4 Left Gluteus o Skin Prep o Skin Prep Wound #5 Right Gluteus o Skin Prep o Skin Prep Wound #6 Medial Sacrum o Skin Prep o Skin Prep Primary Wound Dressing Wound #1 Right,Proximal Back o Prisma Ag Wound #4 Left Gluteus o Aquacel Ag Wound #5 Right Gluteus o Aquacel Ag Wound #6 Medial Sacrum o Aquacel Ag Secondary Dressing Wound #1 Right,Proximal Back o Boardered Foam Dressing Wound #4 Left Gluteus o Boardered Foam Dressing o Boardered Foam Dressing Wound #5 Right Gluteus o Boardered Foam Dressing o Boardered Foam Dressing Kawa, Doyle C. ([id number]) Wound #6 Medial Sacrum o Boardered Foam Dressing o Boardered Foam Dressing Dressing Change Frequency Wound #1 Right,Proximal Back o Change dressing every other day. Wound #4 Left Gluteus o Change dressing every other day. Wound #5 Right Gluteus o Change dressing every other day. Wound #6 Medial Sacrum o Change dressing every other day. Follow-up Appointments Wound #1  Right,Proximal Back o Return Appointment in 1 week. Wound #4 Left Gluteus o Return Appointment in 1 week. Wound #5 Right Gluteus o Return Appointment in 1 week. Wound #6 Medial Sacrum o Return Appointment in 1 week. Off-Loading Wound #1 Right,Proximal Back o Roho cushion for wheelchair o Turn and reposition every 2 hours Wound #4 Left Gluteus o Roho cushion for wheelchair o Turn and reposition every 2 hours Wound #5 Right Gluteus o Roho cushion for wheelchair o Turn and reposition every 2 hours Wound #6 Medial Sacrum o Roho cushion for wheelchair o Turn and reposition every 2 hours Vandall, Ellis C. ([id number]) Additional Orders / Instructions Wound #1 Right,Proximal Back o Increase protein intake. o Other: - Vitamins A, C and Zinc Wound #4 Left Gluteus o Increase protein intake. o Other: - Vitamins A, C and Zinc Wound #5 Right Gluteus o Increase protein intake. o Other: - Vitamins A, C and Zinc Wound #6 Medial Sacrum o Increase protein intake. o Other: - Vitamins A, C and Zinc Electronic Signature(s) Signed: 10/23/2016 5:02:20 PM By: Elliot Gurney, BSN, RN, CWS, Kim RN, BSN  Signed: 10/24/2016 7:49:24 AM By: Baltazar Najjar MD Entered By: Elliot Gurney, BSN, RN, CWS, Kim on 10/23/2016 16:58:04 Loman, [name] ([id number]) -------------------------------------------------------------------------------- Problem List Details Patient Name: [name], [name]. Date of Service: 10/23/2016 3:45 PM Medical Record Number: [id number] Patient Account Number: [id number] Date of Birth/Sex: 1950-08-07 (66 y.o. Male) Treating RN: Huel Coventry Primary Care Provider: Elizabeth Sauer Other Clinician: Referring Provider: Elizabeth Sauer Treating Provider/Extender: Maxwell Caul Weeks in Treatment: 96 Active Problems ICD-10 Encounter Code Description Active Date Diagnosis 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, stage 3 01/24/2016 Yes S14.103S Unspecified injury at  C3 level of cervical spinal cord, 01/24/2016 Yes sequela S51.011A Laceration without foreign body of right elbow, initial 07/03/2016 Yes encounter L89.322 Pressure ulcer of left buttock, stage 2 08/14/2016 Yes L89.310 Pressure ulcer of right buttock, unstageable 09/18/2016 Yes L03.317 Cellulitis of buttock 09/18/2016 Yes Inactive Problems Resolved Problems ICD-10 Code Description Active Date Resolved Date L89.622 Pressure ulcer of left heel, stage 2 06/26/2016 06/26/2016 Electronic Signature(s) Signed: 10/24/2016 7:49:24 AM By: Baltazar Najjar MD Entered By: Baltazar Najjar on 10/23/2016 16:39:20 Munar, [name] ([id number]) Rumbold, [name] ([id number]) -------------------------------------------------------------------------------- Progress Note Details Patient Name: [name] Bras C. Date of Service: 10/23/2016 3:45 PM Medical Record Number: [id number] Patient Account Number: [id number] Date of Birth/Sex: June 19, 1950 (66 y.o. 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 He has lower extremity sensation and can move his legs but has no/limited control over his arms. His wife accompanies him today and states that in the late spring or early summer of 2017 the patient became very depressed. He refused the refused to mobilize and he developed several pressure ulcers on his back. Most of these have healed however they have a recalcitrant area over the right scapula. They've been using Santyl on this for at least the last month. In terms of depression the patient is doing better now on an antidepressant. He saw his primary physician on 01/12/16 at which time there was  apparently green drainage coming out of this area [Dr. Deana Jones]. Dr. Yetta Barre works in the [facility]. He has completed this Septra. Otherwise looking [facility] notes that he has a history of seizures. He also had a stroke in 2008 he follows with neurology. He also has type 2 diabetes on Glucophage, hyperlipidemia and gastroesophageal reflux. He takes Plavix for stroke prevention and Keppra for seizure prophylaxis. 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 02/08/16; this is a patient with a pressure ulcer over the right scapula. His wife has been doing the dressing with Santyl and border foam change every second day. When he arrived here 2 weeks ago we did a fairly extensive mechanical debridement. We are asked medical modalities to go out to the home and see what they might be eligible for in terms of pressure-relief surfaces [level 2] but the wife states that they have not heard from them. 03/20/15; this is a patient we haven't seen in 5-6 weeks. This was largely due to transportation issues. They've been using Santyl and border foam changing every second day for a pressure injury over the right scapula. The patient has a  C3-C4 spinal injury. We had also asked for a review by the people who supplied DME to look at his wheelchair cushion, mattress etc. I don't know that this ever happened. In the meantime the wound has done remarkably well current measurements 1.8 x 2 x 0.1 04/03/16; the patient's dimensions have gone up to 3 cm in diameter quite a deterioration from last time. He is also complaining of pain in this area which is new. 04/10/16; 3 x 1.5 x 0.1. No difference from last week. Culture I did last week was negative he has completed antibiotics. 04/24/16 2.4 x 2 x 0.1; wound generally looks smaller. Middle area that I had to debrided last time looks healthier. His son is fashioned a  large piece of foam cut out where the patient's wound with hit the back of his wheelchair 05/01/16; wound is a same size however the surface of this looks better. The middle innkeeper area required a repeat debridement 05/15/16; wound is down and dimensions granulation still looks healthy. The medial aspect of this wound is now the deeper Divot. We'll see how this responds to further healing. We're using Hydrofera Blue 06/05/16; Wound 1.7x1.1x0.1 still using hyudrofera blue 06/26/16; the patient arrives back in clinic after a three-week hiatus. He was hospitalized from 06/09/16 through [facility] ([id number]) 06/10/16. He was found to be confused. CT scan of the head showed nothing really acute. His sodium was low at 131. He was rehydrated. He was felt to have a UTI and given antibiotics and antibiotics at discharge although his final culture result only showed multiple organisms. His wife says today that at the time of the hospitalization they discovered a large intact blister over the large aspect of his left calcaneus. This is recently ruptured. The wound on his right scapula is somewhat larger. More his wife is concerned about his current status. She states that he is still confused sleeps for long periods. He is not having fever chills cough or diarrhea [1 loose bowel movement per day]. He continues to have a suprapubic catheter. She notes that he is not eating and drinking well. Patient states he just does not want to eat. He does not feel nauseated or vomit. 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 Admission sodium was 131 at discharge 139 BUN 22 and 1.22 at admission, 17 and 1 at discharge. 07/03/16; patient's mental status is back to normal. He has a new wound on the right elbow caused by traumatizing his elbow against the wall apparently at the dermatologist office last week. We continue with the  original wound on the right scapula and the wound from 2 weeks ago on his left heel. We have been using silver alginate to the area on the heel. 08/14/16; patient has not been here in almost 6 weeks. When he was here last time he had the pressure ulcer on the right scapula, atraumatic wound on the right lateral elbow and an area on his left heel. His wife states that at one point all of these were healed and she didn't really feel he needed to come back here. Since then the patient has developed a reopening of the area on the right scapula, a stage II wound on the left buttock and a reopening of the area on the right lateral elbow. As usual his wife as a litany of complaints against home health, she is dismissing or is going to  dismiss well care. She tells [place] she has a long list of supplies at home already for some reason they were not felt to be eligible for a group 2 or 3 surface although they have a hospital bed at home but no offloading surface 08/28/16; the patient arrived today unfortunately incontinent of stool in spite of this the wounds all appear to be better including the right scapular area, his left buttock's left heel and the right lateral elbow. 09/18/16; this is a patient who arrived today for follow-up of a pressure area over his right scapula area, left buttock and there was an area on his right lateral elbow. He arrived in clinic today with a worrisome area over the right initial tuberosity/right buttock. This had a necrotic surface with draining purulence. He has not been systemically unwell. The area over the left elbow healed. He arrived in clinic today with dressing that hadn't been changed over the scapula for 2 or 3 days per her intake. His wife is previously fired home health 09/25/16;; the last time the patient was here he arrived with a new grossly infected wound over the right ischial tuberosity. Culture I did not show a specific pathogen. I did think this required  admission to hospital and he was admitted from 09/18/16 through 09/20/16. Initially given bank and Zosyn but then discharged with 5 days' worth of Augmentin. An MRI was done that did not show osteomyelitis of the right ischial tuberosity however it did show cellulitis and possible myositis. He also has wounds on the left ischial tuberosity and the area over the right scapular area. 10/04/16 on evaluation today patient appears to be doing better in regard to his back wound and is stable in regard to the gluteal wounds. There does not appear to be any evidence of significant infection at this point. He is tolerating the dressing changes currently. His wife has been performing the dressing changes. Nonetheless he does have some discomfort especially in the gluteal areas although he did not specifically rate the discomfort there were times during evaluation where he flinched and it was obvious it was bothering him. No fevers, chills, nausea, or vomiting noted at this time. 10/17/16; the patient has bilateral ischial tuberosity wounds right greater than left. The left seems to be making good progress wears the right has not really changed that much and dimensions. There is also some threatened area on the right side around the wound circumference. Equally worrisome there is a DTI over the lower sacral area however this is not open as of yet. His area over the left scapula which is the [facility]. ([id number]) chronic wound he was coming to the clinic continues to make excellent progress The patient comes in today wanting to talk about getting back in his wheelchair. He is very bored lying in bed at home Objective Constitutional Sitting or standing Blood Pressure is within target range for patient.. Pulse regular and within target range for patient.Marland Kitchen Respirations regular, non-labored and within target range.. Temperature is normal and within the target range for the patient.Marland Kitchen appears in no  distress. Vitals Time Taken: 3:43 PM, Height: 70 in, Weight: 187 lbs, BMI: 26.8, Temperature: 97.5 F, Pulse: 83 bpm, Respiratory Rate: 16 breaths/min, Blood Pressure: 106/62 mmHg. Eyes Conjunctivae clear. No discharge. Respiratory Respiratory effort is easy and symmetric bilaterally. Rate is normal at rest and on room air.. Cardiovascular no dehydration. Lymphatic Nonpalpable inguinal area bilateral. Psychiatric Patient appears depressed today.. General Notes: Wound exam; the area on  the upper scapular area on the right is very clean and appears to be progressing towards [place]. No debridement is required The larger area on the right ischial is larger and there is some reddened area on the circumference of roughly 1:00. The area on the left is smaller and looks healthy. Integumentary (Hair, Skin) Skin and subcutaneous tissue without rashes, lesions, discoloration or ulcers. No evidence of fungal disease. Hair and skin color texture normal and healthy in appearance.. Wound #1 status is Open. Original cause of wound was Pressure Injury. The wound is located on the Right,Proximal Back. The wound measures 0.8cm length x 0.6cm width x 0.1cm depth; 0.377cm^2 area Hoiland, Stephens C. ([id number]) and 0.038cm^3 volume. There is Fat Layer (Subcutaneous Tissue) Exposed exposed. There is no tunneling or undermining noted. There is a medium amount of serosanguineous drainage noted. The wound margin is flat and intact. There is small (1-33%) pink granulation within the wound bed. There is a large (67-100%) amount of necrotic tissue within the wound bed including Eschar. The periwound skin appearance exhibited: Induration, Erythema. The surrounding wound skin color is noted with erythema which is circumferential. Periwound temperature was noted as No Abnormality. The periwound has tenderness on palpation. Wound #4 status is Open. Original cause of wound was Pressure Injury. The wound is located on  the Left Gluteus. The wound measures 0.7cm length x 0.8cm width x 0.1cm depth; 0.44cm^2 area and 0.044cm^3 volume. Wound #5 status is Open. Original cause of wound was Pressure Injury. The wound is located on the Right Gluteus. The wound measures 2.9cm length x 2.4cm width x 0.3cm depth; 5.466cm^2 area and 1.64cm^3 volume. There is Fat Layer (Subcutaneous Tissue) Exposed exposed. There is undermining starting at 5:00 and ending at 8:00 with a maximum distance of 0.8cm. There is a large amount of serosanguineous drainage noted. Foul odor after cleansing was noted. The wound margin is flat and intact. There is small (1-33%) hyper - granulation within the wound bed. There is a large (67-100%) amount of necrotic tissue within the wound bed including Adherent Slough. The periwound skin appearance exhibited: Excoriation, Scarring. The periwound skin appearance did not exhibit: Callus, Crepitus, Induration, Rash, Dry/Scaly, Maceration, Atrophie Blanche, Cyanosis, Ecchymosis, Hemosiderin Staining, Mottled, Pallor, Rubor, Erythema. Periwound temperature was noted as No Abnormality. The periwound has tenderness on palpation. Wound #6 status is Open. Original cause of wound was Pressure Injury. The wound is located on the Medial Sacrum. The wound measures 4.5cm length x 6cm width x 0.1cm depth; 21.206cm^2 area and 2.121cm^3 volume. The wound is limited to skin breakdown. There is no tunneling or undermining noted. There is a medium amount of serous drainage noted. The wound margin is indistinct and nonvisible. There is no granulation within the wound bed. There is no necrotic tissue within the wound bed. The periwound skin appearance exhibited: Ecchymosis, Erythema. The periwound skin appearance did not exhibit: Callus, Crepitus, Excoriation, Induration, Rash, Scarring, Dry/Scaly, Maceration, Atrophie Blanche, Cyanosis, Hemosiderin Staining, Mottled, Pallor, Rubor. The surrounding wound skin color is noted  with erythema which is circumferential. Erythema is measured at 6 cm. Periwound temperature was noted as No Abnormality. The periwound has tenderness on palpation. Other Condition(s) Patient presents with Suspected Deep Tissue Injury located on the sacrum. The skin appearance exhibited: Dry/Scaly, Ecchymosis, Erythema, Induration. 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, stage 3 S14.103S - Unspecified injury at C3 level of cervical spinal cord, sequela S51.011A - Laceration without foreign body of right elbow, initial encounter Bednarczyk,  SHANTI EICHEL ([id number]) L89.322 - Pressure ulcer of left buttock, stage 2 L89.310 - Pressure ulcer of right buttock, unstageable L03.317 - Cellulitis of buttock Plan Wound Cleansing: Wound #1 Right,Proximal Back: Clean wound with Normal Saline. Cleanse wound with mild soap and water Wound #4 Left Gluteus: Clean wound with Normal Saline. Cleanse wound with mild soap and water Cleanse wound with mild soap and water Wound #5 Right Gluteus: Clean wound with Normal Saline. Cleanse wound with mild soap and water Cleanse wound with mild soap and water Wound #6 Medial Sacrum: Clean wound with Normal Saline. Cleanse wound with mild soap and water Cleanse wound with mild soap and water Anesthetic: Wound #1 Right,Proximal Back: Topical Lidocaine 4% cream applied to wound bed prior to debridement Wound #4 Left Gluteus: Topical Lidocaine 4% cream applied to wound bed prior to debridement Wound #5 Right Gluteus: Topical Lidocaine 4% cream applied to wound bed prior to debridement Wound #6 Medial Sacrum: Topical Lidocaine 4% cream applied to wound bed prior to debridement Skin Barriers/Peri-Wound Care: Wound #1 Right,Proximal Back: Skin Prep Wound #4 Left Gluteus: Skin Prep Skin Prep Wound #5 Right Gluteus: Skin Prep Skin Prep Wound #6 Medial Sacrum: Skin Prep Skin Prep Primary Wound Dressing: Wound #1  Right,Proximal Back: Stamour, Jeffey C. ([id number]) Prisma Ag Wound #4 Left Gluteus: Aquacel Ag Wound #5 Right Gluteus: Aquacel Ag Wound #6 Medial Sacrum: Aquacel Ag Secondary Dressing: Wound #1 Right,Proximal Back: Boardered Foam Dressing Wound #4 Left Gluteus: Boardered Foam Dressing Boardered Foam Dressing Wound #5 Right Gluteus: Boardered Foam Dressing Boardered Foam Dressing Wound #6 Medial Sacrum: Boardered Foam Dressing Boardered Foam Dressing Dressing Change Frequency: Wound #1 Right,Proximal Back: Change dressing every other day. Wound #4 Left Gluteus: Change dressing every other day. Wound #5 Right Gluteus: Change dressing every other day. Wound #6 Medial Sacrum: Change dressing every other day. Follow-up Appointments: Wound #1 Right,Proximal Back: Return Appointment in 1 week. Wound #4 Left Gluteus: Return Appointment in 1 week. Wound #5 Right Gluteus: Return Appointment in 1 week. Wound #6 Medial Sacrum: Return Appointment in 1 week. Off-Loading: Wound #1 Right,Proximal Back: Roho cushion for wheelchair Turn and reposition every 2 hours Wound #4 Left Gluteus: Roho cushion for wheelchair Turn and reposition every 2 hours Wound #5 Right Gluteus: Roho cushion for wheelchair Turn and reposition every 2 hours Wound #6 Medial Sacrum: Roho cushion for wheelchair Turn and reposition every 2 hours Additional Orders / Instructions: JAYVEION, STALLING ([id number]) Wound #1 Right,Proximal Back: Increase protein intake. Other: - Vitamins A, C and Zinc Wound #4 Left Gluteus: Increase protein intake. Other: - Vitamins A, C and Zinc Wound #5 Right Gluteus: Increase protein intake. Other: - Vitamins A, C and Zinc Wound #6 Medial Sacrum: Increase protein intake. Other: - Vitamins A, C and Zinc #1. Prisma to the scapula wound to continue #2 Aquacel Ag to continue to both of the areas on his ischial tuberosity bilaterally #3 border foam to the DTI over the  sacrum. #4 an extensive period of time talked to the patient and his wife about the risks of him getting up in the wheelchair for any length of time. Notable that he didn't think he was improving at all where as the scapula wound in the left ischial tuberosity wounds are actually improving. For him to get up in the wheelchair for even 2 hours with the risk prohibitive especially in terms of the DTI over the sacrum Electronic Signature(s) Signed: 10/31/2016 4:31:27 AM By: Baltazar Najjar MD Signed: 11/06/2016 8:11:33 AM  By: Elliot Gurney, BSN, RN, CWS, Kim RN, BSN Previous Signature: 10/24/2016 7:45:42 AM Version By: Baltazar Najjar MD Previous Signature: 10/24/2016 7:43:12 AM Version By: Baltazar Najjar MD Entered By: Elliot Gurney, BSN, RN, CWS, Kim on 10/25/2016 16:11:16 Obeso, [name] ([id number]) -------------------------------------------------------------------------------- SuperBill Details Patient Name: [name], [name]. Date of Service: 10/23/2016 Medical Record Number: [id number] Patient Account Number: [id number] Date of Birth/Sex: 06-Jun-1950 (66 y.o. Male) Treating RN: Huel Coventry Primary Care Provider: Elizabeth Sauer Other Clinician: Referring Provider: Elizabeth Sauer Treating Provider/Extender: Maxwell Caul Weeks in Treatment: 39 Diagnosis Coding ICD-10 Codes Code Description [phone] Pressure ulcer of other site, stage 3 S14.103S Unspecified injury at C3 level of cervical spinal cord, sequela S51.011A Laceration without foreign body of right elbow, initial encounter L89.322 Pressure ulcer of left buttock, stage 2 L89.310 Pressure ulcer of right buttock, unstageable L03.317 Cellulitis of buttock Facility Procedures CPT4 Code: [id number] Description: 84696 - WOUND CARE VISIT-LEV 5 EST PT Modifier: Quantity: 1 Physician Procedures CPT4 Code: 2952841 Description: 99213 - WC PHYS LEVEL 3 - EST PT ICD-10 Description Diagnosis 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, stage 3 L89.322 Pressure  ulcer of left buttock, stage 2 L89.310 Pressure ulcer of right buttock, unstageable Modifier: Quantity: 1 Electronic Signature(s) Signed: 10/24/2016 7:49:24 AM By: Baltazar Najjar MD Previous Signature: 10/23/2016 5:02:20 PM Version By: Elliot Gurney, BSN, RN, CWS, Kim RN, BSN Entered By: Baltazar Najjar on 10/24/2016 07:46:18

## 2016-11-06 ENCOUNTER — Ambulatory Visit: Payer: Medicare Other | Admitting: Internal Medicine

## 2016-11-12 ENCOUNTER — Emergency Department
Admission: EM | Admit: 2016-11-12 | Discharge: 2016-11-12 | Disposition: A | Discharge status: Home / Self Care | Payer: Medicare Other | Attending: Emergency Medicine | Admitting: Emergency Medicine

## 2016-11-12 DIAGNOSIS — I2510 Atherosclerotic heart disease of native coronary artery without angina pectoris: Secondary | ICD-10-CM | POA: Insufficient documentation

## 2016-11-12 DIAGNOSIS — E119 Type 2 diabetes mellitus without complications: Secondary | ICD-10-CM | POA: Diagnosis not present

## 2016-11-12 DIAGNOSIS — Z7902 Long term (current) use of antithrombotics/antiplatelets: Secondary | ICD-10-CM | POA: Diagnosis not present

## 2016-11-12 DIAGNOSIS — L089 Local infection of the skin and subcutaneous tissue, unspecified: Secondary | ICD-10-CM | POA: Diagnosis not present

## 2016-11-12 DIAGNOSIS — R509 Fever, unspecified: Secondary | ICD-10-CM | POA: Diagnosis present

## 2016-11-12 DIAGNOSIS — L8993 Pressure ulcer of unspecified site, stage 3: Secondary | ICD-10-CM | POA: Insufficient documentation

## 2016-11-12 DIAGNOSIS — N390 Urinary tract infection, site not specified: Secondary | ICD-10-CM

## 2016-11-12 DIAGNOSIS — Z7984 Long term (current) use of oral hypoglycemic drugs: Secondary | ICD-10-CM | POA: Diagnosis not present

## 2016-11-12 DIAGNOSIS — Z79899 Other long term (current) drug therapy: Secondary | ICD-10-CM | POA: Insufficient documentation

## 2016-11-12 DIAGNOSIS — L89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, stage 3: Secondary | ICD-10-CM | POA: Diagnosis not present

## 2016-11-12 LAB — CBC WITH DIFFERENTIAL/PLATELET
BASOS ABS: 0.1 10*3/uL (ref 0–0.1)
BASOS PCT: 1 %
EOS ABS: 0.2 10*3/uL (ref 0–0.7)
Eosinophils Relative: 2 %
HEMATOCRIT: 32.8 % — AB (ref 40.0–52.0)
HEMOGLOBIN: 10.9 g/dL — AB (ref 13.0–18.0)
Lymphocytes Relative: 26 %
Lymphs Abs: 3 10*3/uL (ref 1.0–3.6)
MCH: 27.3 pg (ref 26.0–34.0)
MCHC: 33.3 g/dL (ref 32.0–36.0)
MCV: 82 fL (ref 80.0–100.0)
Monocytes Absolute: 1.4 10*3/uL — ABNORMAL HIGH (ref 0.2–1.0)
Monocytes Relative: 12 %
NEUTROS ABS: 6.9 10*3/uL — AB (ref 1.4–6.5)
NEUTROS PCT: 59 %
Platelets: 293 10*3/uL (ref 150–440)
RBC: 4 MIL/uL — ABNORMAL LOW (ref 4.40–5.90)
RDW: 16.9 % — ABNORMAL HIGH (ref 11.5–14.5)
WBC: 11.7 10*3/uL — AB (ref 3.8–10.6)

## 2016-11-12 LAB — BASIC METABOLIC PANEL
ANION GAP: 7 (ref 5–15)
BUN: 24 mg/dL — ABNORMAL HIGH (ref 6–20)
CALCIUM: 8.7 mg/dL — AB (ref 8.9–10.3)
CHLORIDE: 99 mmol/L — AB (ref 101–111)
CO2: 28 mmol/L (ref 22–32)
Creatinine, Ser: 0.9 mg/dL (ref 0.61–1.24)
GFR calc non Af Amer: >60 mL/min (ref >60)
Glucose, Bld: 127 mg/dL — ABNORMAL HIGH (ref 65–99)
Potassium: 4.1 mmol/L (ref 3.5–5.1)
SODIUM: 134 mmol/L — AB (ref 135–145)

## 2016-11-12 LAB — URINALYSIS, COMPLETE (UACMP) WITH MICROSCOPIC
Bilirubin Urine: NEGATIVE
GLUCOSE, UA: NEGATIVE mg/dL
KETONES UR: NEGATIVE mg/dL
Nitrite: POSITIVE — AB
Protein, ur: 30 mg/dL — AB
SPECIFIC GRAVITY, URINE: 1.008 (ref 1.005–1.030)
pH: 7 (ref 5.0–8.0)

## 2016-11-12 LAB — LACTIC ACID, PLASMA: LACTIC ACID, VENOUS: 1.9 mmol/L (ref 0.5–1.9)

## 2016-11-12 MED ORDER — CEPHALEXIN 500 MG PO CAPS: 500.0000 mg | ORAL_CAPSULE | Freq: Three times a day (TID) | ORAL | 0 refills | Status: AC

## 2016-11-12 MED ORDER — SULFAMETHOXAZOLE-TRIMETHOPRIM 800-160 MG PO TABS: 1.0000 | ORAL_TABLET | Freq: Two times a day (BID) | ORAL | 0 refills | Status: AC

## 2016-11-12 MED ORDER — PIPERACILLIN-TAZOBACTAM 3.375 G IVPB 30 MIN
INTRAVENOUS | Status: AC
  Administered 2016-11-12: 3.375 g via INTRAVENOUS
  Filled 2016-11-12: qty 50

## 2016-11-12 MED ORDER — PIPERACILLIN-TAZOBACTAM 3.375 G IVPB 30 MIN
3.3750 g | Freq: Once | INTRAVENOUS | Status: AC
Start: 2016-11-12 — End: 2016-11-12
  Administered 2016-11-12: 3.375 g via INTRAVENOUS

## 2016-11-12 NOTE — Discharge Instructions (Signed)
take both antibiotics as prescribed. Follow-up with the wound clinic this week. Return to the emergency room if patient continues to have fever after 24 hours of antibiotics, vomiting, abdominal pain or flank pain. Otherwise follow-up with his primary care doctor

## 2016-11-12 NOTE — ED Triage Notes (Signed)
Family reports patient with history of wounds on buttocks that he is seen at wound center for.  Reports today noticed foul odor coming from one of them.

## 2016-11-12 NOTE — ED Notes (Signed)
ED Provider at bedside. 

## 2016-11-12 NOTE — ED Provider Notes (Signed)
[facility] Emergency Department Provider Note  ____________________________________________  Time seen: Approximately 9:30 PM  I have reviewed the triage vital signs and the nursing notes.   HISTORY  Chief Complaint Wound Infection   HPI [name] is a 66 y.o. male with a history of paraplegia and several decubitus ulcers followed by the wound clinic who presents for evaluation of infection of one of his ulcers.history is gathered mostly from patient's wife. Patient has missed his last appointment at the wound clinic because he had diarrhea and according to the wife they were told not to come to the wound clinic. For the last 7 days she has noted a foul smell coming from one of the decubitus ulcer and also noticed the skin is turning black. She called the clinic but they're not able to see her until the end of the week. The patient had a fever of 101F today which prompted the visit to the ED. Patient does have an indwelling suprapubic catheter. NO cough or congestion, no diarrhea or vomiting.  Past Medical History:  Diagnosis Date  . Allergy   . Anxiety   . CAD (coronary artery disease)   . Diabetes mellitus without complication (HCC)   . GERD (gastroesophageal reflux disease)   . Hyperlipidemia   . Quadriplegia (HCC)   . Seizures (HCC)   . Spinal cord disease (HCC)   . Stroke [facility])     Patient Active Problem List   Diagnosis Date Noted  . Decubitus ulcer 09/18/2016  . Confusion 06/09/2016  . Carotid stenosis 03/20/2016  . GERD (gastroesophageal reflux disease) 05/13/2015  . CAD (coronary artery disease) 05/13/2015  . Anxiety 05/13/2015  . Quadriplegia (HCC) 05/13/2015  . UTI (lower urinary tract infection) 05/13/2015  . Adjustment disorder with depressed mood 02/23/2015  . Insomnia 02/23/2015  . Screening for diabetes mellitus 08/31/2014  . 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 Hemiplegia (HCC) 10/01/2012  . HLD  (hyperlipidemia) 10/01/2012  . Seizure (HCC) 10/01/2012  . Concussion and swelling of spinal cord 10/01/2012    Past Surgical History:  Procedure Laterality Date  . ENDARTERECTOMY    . ENDARTERECTOMY    . TRACHEOSTOMY      Prior to Admission medications   Medication Sig Start Date End Date Taking? Authorizing Provider  atorvastatin (LIPITOR) 10 MG tablet Take 10 mg by mouth daily.    [provider]  baclofen (LIORESAL) 20 MG tablet Take 40 mg by mouth at bedtime.    [provider]  cephALEXin (KEFLEX) 500 MG capsule Take 1 capsule (500 mg total) by mouth 3 (three) times daily. 11/12/16 11/19/16  Nita Sickle, MD  citalopram (CELEXA) 40 MG tablet Take 1 tablet (40 mg total) by mouth daily. 07/31/16   Duanne Limerick, MD  clopidogrel (PLAVIX) 75 MG tablet Take 75 mg by mouth daily.    [provider]  diazepam (VALIUM) 5 MG tablet Take 5-10 mg by mouth 3 (three) times daily as needed for muscle spasms. Take 5 mg in the morning, 5 mg at noon and 10 mg every night at bedtime as needed for spasms. 03/02/15   [provider]  furosemide (LASIX) 20 MG tablet Take 1 tablet (20 mg total) by mouth daily. 05/15/16   Duanne Limerick, MD  levETIRAcetam (KEPPRA) 750 MG tablet Take 750 mg by mouth 3 (three) times daily.    [provider]  metFORMIN (GLUCOPHAGE) 500 MG tablet Take 500 mg by mouth 2 (two)  times daily with a meal.    [provider]  mirtazapine (REMERON) 15 MG tablet Take 15 mg by mouth at bedtime. Dr. Carlena Hurl    [provider]  oxybutynin (DITROPAN) 5 MG tablet Take 5 mg by mouth 2 (two) times daily.    [provider]  pantoprazole (PROTONIX) 40 MG tablet Take 1 tablet (40 mg total) by mouth daily. 05/15/16   Duanne Limerick, MD  sulfamethoxazole-trimethoprim (BACTRIM DS,SEPTRA DS) 800-160 MG tablet Take 1 tablet by mouth 2 (two) times daily. 11/12/16 11/22/16  Nita Sickle, MD    Allergies Patient has no  known allergies.  Family History  Problem Relation Age of Onset  . Stroke Father     Social History Social History  Substance Use Topics  . Smoking status: Never Smoker  . Smokeless tobacco: Never Used  . Alcohol use No    Review of Systems  Constitutional: + fever. Eyes: Negative for visual changes. ENT: Negative for sore throat. Neck: No neck pain  Cardiovascular: Negative for chest pain. Respiratory: Negative for shortness of breath. Gastrointestinal: Negative for abdominal pain, vomiting or diarrhea. Genitourinary: Negative for dysuria. Musculoskeletal: Negative for back pain. Skin: Negative for rash. + decubitus ulcer Neurological: Negative for headaches, weakness or numbness. Psych: No SI or HI  ____________________________________________   PHYSICAL EXAM:  VITAL SIGNS: ED Triage Vitals  Enc Vitals Group     BP 11/12/16 2041 (!) 104/49     Pulse Rate 11/12/16 2041 82     Resp 11/12/16 2041 (!) 22     Temp -- 97.55F     Temp src --      SpO2 --      Weight 11/12/16 2040 135 lb (61.2 kg)     Height --      Head Circumference --      Peak Flow --      Pain Score --      Pain Loc --      Pain Edu? --      Excl. in GC? --     Constitutional: Alert and oriented, chronically ill appearing, no distress.  HEENT:      Head: Normocephalic and atraumatic.         Eyes: Conjunctivae are normal. Sclera is non-icteric.       Mouth/Throat: Mucous membranes are moist.       Neck: Supple with no signs of meningismus. Cardiovascular: Regular rate and rhythm. No murmurs, gallops, or rubs. 2+ symmetrical distal pulses are present in all extremities. No JVD. Respiratory: Normal respiratory effort. Lungs are clear to auscultation bilaterally. No wheezes, crackles, or rhonchi.  Gastrointestinal: Soft, non tender, and non distended with positive bowel sounds. Suprapubic catheter draining cloudy urine with sediment Musculoskeletal: No edema, cyanosis, or erythema of  extremities. Neurologic: Normal speech and language. Face is symmetric. Moving upper extremities, paralysis of b/l LE Skin: Skin is warm, dry and intact. There is a 4-5 cm stage 3 decubitus ulcer on patient's R buttock region with necrotic skin and small amount of surrounding erythema and warmth Psychiatric: Mood and affect are normal. Speech and behavior are normal.  ____________________________________________   LABS (all labs ordered are listed, but only abnormal results are displayed)  Labs Reviewed  CBC WITH DIFFERENTIAL/PLATELET - Abnormal; Notable for the following:       Result Value   WBC 11.7 (*)    RBC 4.00 (*)    Hemoglobin 10.9 (*)    HCT 32.8 (*)  RDW 16.9 (*)    Neutro Abs 6.9 (*)    Monocytes Absolute 1.4 (*)    All other components within normal limits  BASIC METABOLIC PANEL - Abnormal; Notable for the following:    Sodium 134 (*)    Chloride 99 (*)    Glucose, Bld 127 (*)    BUN 24 (*)    Calcium 8.7 (*)    All other components within normal limits  URINALYSIS, COMPLETE (UACMP) WITH MICROSCOPIC - Abnormal; Notable for the following:    Color, Urine YELLOW (*)    APPearance TURBID (*)    Hgb urine dipstick MODERATE (*)    Protein, ur 30 (*)    Nitrite POSITIVE (*)    Leukocytes, UA LARGE (*)    Bacteria, UA MANY (*)    Squamous Epithelial / LPF 0-5 (*)    All other components within normal limits  AEROBIC CULTURE (SUPERFICIAL SPECIMEN)  URINE CULTURE  CULTURE, BLOOD (SINGLE)  LACTIC ACID, PLASMA   ____________________________________________  EKG  none  ____________________________________________  RADIOLOGY  none  ____________________________________________   PROCEDURES  Procedure(s) performed: None Procedures Critical Care performed:  None ____________________________________________   INITIAL IMPRESSION / ASSESSMENT AND PLAN / ED COURSE  66 y.o. male with a history of paraplegia and several decubitus ulcers followed by the  wound clinic who presents for evaluation of infection of one of his ulcers and fever. patient has a stage III decubitus ulcer with some necrotic tissue and a small amount of surrounding erythema and warmth, no purulent discharge, I do not appreciate foul-smelling. The wound was swabbed for culture. We'll check basic blood work and lactic acid to rule out any evidence of systemic infection. Patient has no tachycardia or fever at this time. Patient is followed closely by the wound clinic and if labs are within normal range I will give a dose of IV antibiotics and discharge patient on antibiotics with close follow-up at the wound clinic. Since patient has an indwelling catheter I will also send urinalysis to rule out that as a possible source for patient's fever.    _________________________ 10:49 PM on 11/12/2016 -----------------------------------------  urinalysis positive for urinary tract infection. suprapubic catheter was changed yesterday evening therefore we did not change in the emergency room. Lactic within normal limits. Patient was given a dose of Zosyn. Cultures from wound are pending. Patient will be discharged home on Bactrim and Keflex for cellulitis and UTI. Recommend close follow-up with the wound clinic and PCP. Recommend return to the emergency room if patient has a fever after 24 hours of antibiotics, if he starts vomiting, or if he develops abdominal or flank pain.  Pertinent labs & imaging results that were available during my care of the patient were reviewed by me and considered in my medical decision making (see chart for details).    ____________________________________________   FINAL CLINICAL IMPRESSION(S) / ED DIAGNOSES  Final diagnoses:  Complicated UTI (urinary tract infection)  Infected decubitus ulcer, stage III (HCC)      NEW MEDICATIONS STARTED DURING THIS VISIT:  New Prescriptions   CEPHALEXIN (KEFLEX) 500 MG CAPSULE    Take 1 capsule (500 mg total) by  mouth 3 (three) times daily.   SULFAMETHOXAZOLE-TRIMETHOPRIM (BACTRIM DS,SEPTRA DS) 800-160 MG TABLET    Take 1 tablet by mouth 2 (two) times daily.     Note:  This document was prepared using Dragon voice recognition software and may include unintentional dictation errors.    Don Perking, [place], MD 11/12/16 2250

## 2016-11-14 ENCOUNTER — Encounter: Payer: Medicare Other | Attending: Internal Medicine | Admitting: Internal Medicine

## 2016-11-14 DIAGNOSIS — Z7984 Long term (current) use of oral hypoglycemic drugs: Secondary | ICD-10-CM | POA: Insufficient documentation

## 2016-11-14 DIAGNOSIS — R569 Unspecified convulsions: Secondary | ICD-10-CM | POA: Diagnosis not present

## 2016-11-14 DIAGNOSIS — E119 Type 2 diabetes mellitus without complications: Secondary | ICD-10-CM | POA: Diagnosis not present

## 2016-11-14 DIAGNOSIS — K219 Gastro-esophageal reflux disease without esophagitis: Secondary | ICD-10-CM | POA: Diagnosis not present

## 2016-11-14 DIAGNOSIS — F329 Major depressive disorder, single episode, unspecified: Secondary | ICD-10-CM | POA: Diagnosis not present

## 2016-11-14 DIAGNOSIS — G8252 Quadriplegia, C1-C4 incomplete: Secondary | ICD-10-CM | POA: Diagnosis not present

## 2016-11-14 DIAGNOSIS — L03317 Cellulitis of buttock: Secondary | ICD-10-CM | POA: Insufficient documentation

## 2016-11-14 DIAGNOSIS — X58XXXS Exposure to other specified factors, sequela: Secondary | ICD-10-CM | POA: Insufficient documentation

## 2016-11-14 DIAGNOSIS — L89310 Pressure ulcer of right buttock, unstageable: Secondary | ICD-10-CM | POA: Insufficient documentation

## 2016-11-14 DIAGNOSIS — S14103S Unspecified injury at C3 level of cervical spinal cord, sequela: Secondary | ICD-10-CM | POA: Insufficient documentation

## 2016-11-14 DIAGNOSIS — S51011A Laceration without foreign body of right elbow, initial encounter: Secondary | ICD-10-CM | POA: Insufficient documentation

## 2016-11-14 DIAGNOSIS — L89322 Pressure ulcer of left buttock, stage 2: Secondary | ICD-10-CM | POA: Diagnosis not present

## 2016-11-14 DIAGNOSIS — I959 Hypotension, unspecified: Secondary | ICD-10-CM | POA: Diagnosis not present

## 2016-11-14 DIAGNOSIS — L89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, stage 3: Secondary | ICD-10-CM | POA: Insufficient documentation

## 2016-11-14 DIAGNOSIS — Z8673 Personal history of transient ischemic attack (TIA), and cerebral infarction without residual deficits: Secondary | ICD-10-CM | POA: Insufficient documentation

## 2016-11-14 LAB — URINE CULTURE

## 2016-11-15 ENCOUNTER — Other Ambulatory Visit
Admission: RE | Admit: 2016-11-15 | Discharge: 2016-11-15 | Disposition: A | Discharge status: Home / Self Care | Payer: Medicare Other | Source: Ambulatory Visit | Attending: Internal Medicine | Admitting: Internal Medicine

## 2016-11-15 DIAGNOSIS — S31819A Unspecified open wound of right buttock, initial encounter: Secondary | ICD-10-CM | POA: Insufficient documentation

## 2016-11-15 DIAGNOSIS — X58XXXA Exposure to other specified factors, initial encounter: Secondary | ICD-10-CM | POA: Insufficient documentation

## 2016-11-15 DIAGNOSIS — L89130 Pressure ulcer of right lower back, unstageable: Secondary | ICD-10-CM | POA: Diagnosis not present

## 2016-11-15 LAB — AEROBIC CULTURE  (SUPERFICIAL SPECIMEN)

## 2016-11-15 LAB — AEROBIC CULTURE W GRAM STAIN (SUPERFICIAL SPECIMEN): Gram Stain: NONE SEEN

## 2016-11-16 NOTE — Progress Notes (Signed)
[name] ([id number]) Visit Report for 11/14/2016 Debridement Details Patient Name: [name], [name]. Date of Service: 11/14/2016 3:30 PM Medical Record Number: [id number] Patient Account Number: [id number] Date of Birth/Sex: [birth year] (66 y.o. Male) Treating RN: Primary Care Provider: Elizabeth Sauer Other Clinician: Referring Provider: Elizabeth Sauer Treating Provider/Extender: Altamese Pierce in Treatment: 42 Debridement Performed for Wound #5 Right Gluteus Assessment: Performed By: Physician Maxwell Caul, MD Debridement: Debridement Pre-procedure Verification/Time Out Yes - 16:08 Taken: Start Time: 16:09 Pain Control: Lidocaine 4% Topical Solution Level: Skin/Subcutaneous Tissue Total Area Debrided (L x 3 (cm) x 3.5 (cm) = 10.5 (cm) W): Tissue and other Viable, Non-Viable, Exudate, Fibrin/Slough, Muscle, Subcutaneous material debrided: Instrument: Curette Specimen: Swab Number of Specimens 1 Taken: Bleeding: Minimum Hemostasis Achieved: Pressure End Time: 16:13 Procedural Pain: 0 Post Procedural Pain: 0 Response to Treatment: Procedure was tolerated well Post Debridement Measurements of Total Wound Length: (cm) 3 Stage: Unstageable/Unclassified Width: (cm) 3.5 Depth: (cm) 0.9 Volume: (cm) 7.422 Character of Wound/Ulcer Post Requires Further Debridement: Debridement Post Procedure Diagnosis Same as Pre-procedure [name], [name] ([id number]) Electronic Signature(s) Signed: 11/14/2016 5:33:43 PM By: Baltazar Najjar MD Entered By: Baltazar Najjar on 11/14/2016 16:48:22 [place], [name] ([id number]) -------------------------------------------------------------------------------- HPI Details Patient Name: [name] Bras C. Date of Service: 11/14/2016 3:30 PM Medical Record Number: [id number] Patient Account Number: [id number] Date of Birth/Sex: [birth year] (66 y.o. 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 He has lower extremity sensation and can move his legs but has no/limited control over his arms. His wife accompanies him today and states that in the late spring or early summer of 2017 the patient became very depressed. He refused the refused to mobilize and he developed several pressure ulcers on his back. Most of these have healed however they have a recalcitrant area over the right scapula. They've been using Santyl on this for at least the last month. In terms of depression the patient is doing better now on an antidepressant. He saw his primary physician on 01/12/16 at which time there was apparently green drainage coming out of this area [Dr. Deana Jones]. Dr. Yetta Barre works in the [facility]. He has completed this Septra. Otherwise looking through [facility] notes that he has a history of seizures. He also had a stroke in 2008 he follows with neurology. He also has type 2 diabetes on Glucophage, hyperlipidemia and gastroesophageal reflux. He takes Plavix for stroke prevention and Keppra for seizure prophylaxis. 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 02/08/16; this is a patient with a pressure ulcer over the right scapula. His wife has been doing the dressing with Santyl and border foam change every second day. When he arrived here 2 weeks ago we did a fairly extensive mechanical debridement. We are asked medical modalities to go out to the home and see what they might be eligible for in terms of pressure-relief surfaces [level 2] but the wife states that they have not heard from  them. 03/20/15; this is a patient we haven't seen in 5-6 weeks. This was largely due to transportation issues. They've been using Santyl and border foam changing  every second day for a pressure injury over the right scapula. The patient has a C3-C4 spinal injury. We had also asked for a review by the people who supplied DME to look at his wheelchair cushion, mattress etc. I don't know that this ever happened. In the meantime the wound has done remarkably well current measurements 1.8 x 2 x 0.1 04/03/16; the patient's dimensions have gone up to 3 cm in diameter quite a deterioration from last time. He is also complaining of pain in this area which is new. 04/10/16; 3 x 1.5 x 0.1. No difference from last week. Culture I did last week was negative he has completed antibiotics. 04/24/16 2.4 x 2 x 0.1; wound generally looks smaller. Middle area that I had to debrided last time looks healthier. His son is fashioned a large piece of foam cut out where the patient's wound with hit the back of his wheelchair 05/01/16; wound is a same size however the surface of this looks better. The middle innkeeper area required a repeat debridement 05/15/16; wound is down and dimensions granulation still looks healthy. The medial aspect of this wound is now the deeper Divot. We'll see how this responds to further healing. We're using Hydrofera Blue 06/05/16; Wound 1.7x1.1x0.1 still using hyudrofera blue 06/26/16; the patient arrives back in clinic after a three-week hiatus. He was hospitalized from 06/09/16 through 06/10/16. He was found to be confused. CT scan of the head showed nothing really acute. His sodium was [name], [name] ([id number]) low at 131. He was rehydrated. He was felt to have a UTI and given antibiotics and antibiotics at discharge although his final culture result only showed multiple organisms. His wife says today that at the time of the hospitalization they discovered a large intact blister over the large  aspect of his left calcaneus. This is recently ruptured. The wound on his right scapula is somewhat larger. More his wife is concerned about his current status. She states that he is still confused sleeps for long periods. He is not having fever chills cough or diarrhea [1 loose bowel movement per day]. He continues to have a suprapubic catheter. She notes that he is not eating and drinking well. Patient states he just does not want to eat. He does not feel nauseated or vomit. 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 Admission sodium was 131 at discharge 139 BUN 22 and 1.22 at admission, 17 and 1 at discharge. 07/03/16; patient's mental status is back to normal. He has a new wound on the right elbow caused by traumatizing his elbow against the wall apparently at the dermatologist office last week. We continue with the original wound on the right scapula and the wound from 2 weeks ago on his left heel. We have been using silver alginate to the area on the heel. 08/14/16; patient has not been here in almost 6 weeks. When he was here last time he had the pressure ulcer on the right scapula, atraumatic wound on the right lateral elbow and an area on his left heel. His wife states that at one point all of these were healed and she didn't really feel he needed to come back here. Since then the patient has developed a reopening of the area on the right scapula, a stage II wound on the left buttock and a reopening of the area on the right lateral elbow. As usual his wife as  a litany of complaints against home health, she is dismissing or is going to dismiss well care. She tells [place] she has a long list of supplies at home already for some reason they were not felt to be eligible for a group 2 or 3 surface although they have a hospital bed at home but no offloading surface 08/28/16; the patient arrived today unfortunately  incontinent of stool in spite of this the wounds all appear to be better including the right scapular area, his left buttock's left heel and the right lateral elbow. 09/18/16; this is a patient who arrived today for follow-up of a pressure area over his right scapula area, left buttock and there was an area on his right lateral elbow. He arrived in clinic today with a worrisome area over the right initial tuberosity/right buttock. This had a necrotic surface with draining purulence. He has not been systemically unwell. The area over the left elbow healed. He arrived in clinic today with dressing that hadn't been changed over the scapula for 2 or 3 days per her intake. His wife is previously fired home health 09/25/16;; the last time the patient was here he arrived with a new grossly infected wound over the right ischial tuberosity. Culture I did not show a specific pathogen. I did think this required admission to hospital and he was admitted from 09/18/16 through 09/20/16. Initially given vanc and Zosyn but then discharged with 5 days' worth of Augmentin. An MRI was done that did not show osteomyelitis of the right ischial tuberosity however it did show cellulitis and possible myositis. He also has wounds on the left ischial tuberosity and the area over the right scapular area. 10/04/16 on evaluation today patient appears to be doing better in regard to his back wound and is stable in regard to the gluteal wounds. There does not appear to be any evidence of significant infection at this point. He is tolerating the dressing changes currently. His wife has been performing the dressing changes. Nonetheless he does have some discomfort especially in the gluteal areas although he did not specifically rate the discomfort there were times during evaluation where he flinched and it was obvious it was bothering him. No fevers, chills, nausea, or vomiting noted at this time. 10/17/16; the patient has bilateral  ischial tuberosity wounds right greater than left. The left seems to be making good progress wears the right has not really changed that much and dimensions. There is also some threatened area on the right side around the wound circumference. Equally worrisome there is a DTI over the lower sacral area however this is not open as of yet. His area over the left scapula which is the chronic wound he was coming to the clinic continues to make excellent progress [name], [name] ([id number]) 8/28 The patient comes in today wanting to talk about getting back in his wheelchair. He is very bored lying in bed at home 11/14/16; three-week hiatus for this patient for medical illnesses whether etc. He was in the emergency room at [facility] 2 days ago when his wife noted odor coming out of the right initial tuberosity wound and discoloration. I reviewed his ER presentation from 11/12/16. White count was 11.7 differential count reasonably normal basic metabolic panel was normal. Serum lactate was 1.9. He has a suprapubic catheter which showed positive urine nitrate large leukocytes and many bacteria. Urine cultures come back showing multiple species. Blood culture was negative. Wound culture is still pending. He was put on Keflex  and Bactrim. The patient had an MRI of this area on 09/18/16 that did not show osteomyelitis however at that time there was possible cellulitis and infectious myositis no drainable fluid collection. After this he wishes admitted the hospital had vancomycin and Zosyn for 5 days and then 5 days of Augmentin Electronic Signature(s) Signed: 11/14/2016 5:33:43 PM By: Baltazar Najjar MD Entered By: Baltazar Najjar on 11/14/2016 16:45:31 Zehring, [name] ([id number]) -------------------------------------------------------------------------------- Physical Exam Details Patient Name: Kriegel, Jakarie C. Date of Service: 11/14/2016 3:30 PM Medical Record Number: [id number] Patient Account Number:  [id number] Date of Birth/Sex: 21-Oct-1950 (66 y.o. Male) Treating RN: Primary Care Provider: Elizabeth Sauer Other Clinician: Referring Provider: Elizabeth Sauer Treating Provider/Extender: Maxwell Caul Weeks in Treatment: 35 Constitutional Patient is hypotensive. Not out of keeping with his usual he does not appear to be actively septic. Pulse regular and within target range for patient.Marland Kitchen Respirations regular, non-labored and within target range.. Temperature is normal and within the target range for the patient.Marland Kitchen appears in no distress. Alert and mentating. Notes Wound exam; nothing good about this today. oThe area over the right ischial tuberosity is deteriorating 50% of this necrotic material. Using a #5 curet I removed necrotic subcutaneous and muscle and I got down to a reasonable base of the wound however the circumference is red and necrotic. I suspect there is ongoing and underlying infection here. oThe area over the left initial tuberosity is superficial but appears to have some developing deep to the tissue injury oOver the left. Pelvis but appears to be a deep tissue injury as well as over the right greater trochanter oThe area over his right scapula which is original wound is not healed but appears to be reasonably healthy and is not a major clinical issue at the moment Electronic Signature(s) Signed: 11/14/2016 5:33:43 PM By: Baltazar Najjar MD Entered By: Baltazar Najjar on 11/14/2016 16:41:52 [name], [name] ([id number]) -------------------------------------------------------------------------------- Physician Orders Details Patient Name: [name] Bras C. Date of Service: 11/14/2016 3:30 PM Medical Record Number: [id number] Patient Account Number: [id number] Date of Birth/Sex: [birth year] (66 y.o. Male) Treating RN: Phillis Haggis Primary Care Provider: Elizabeth Sauer Other Clinician: Referring Provider: Elizabeth Sauer Treating Provider/Extender: Altamese Hutto in Treatment: 30 Verbal / Phone Orders: Yes Clinician: Ashok Cordia, Debi Read Back and Verified: Yes Diagnosis Coding Wound Cleansing Wound #1 Right,Proximal Back o Clean wound with Normal Saline. o Cleanse wound with mild soap and water Wound #4 Left Gluteus o Clean wound with Normal Saline. o Cleanse wound with mild soap and water o Cleanse wound with mild soap and water Wound #5 Right Gluteus o Clean wound with Normal Saline. o Cleanse wound with mild soap and water o Cleanse wound with mild soap and water Wound #6 Medial Sacrum o Clean wound with Normal Saline. o Cleanse wound with mild soap and water o Cleanse wound with mild soap and water Anesthetic Wound #1 Right,Proximal Back o Topical Lidocaine 4% cream applied to wound bed prior to debridement Wound #4 Left Gluteus o Topical Lidocaine 4% cream applied to wound bed prior to debridement Wound #5 Right Gluteus o Topical Lidocaine 4% cream applied to wound bed prior to debridement Wound #6 Medial Sacrum o Topical Lidocaine 4% cream applied to wound bed prior to debridement Skin Barriers/Peri-Wound Care Wound #1 Right,Proximal Back o Skin Prep [name], [name] ([id number]) Wound #4 Left Gluteus o Skin Prep o Skin Prep Wound #5 Right Gluteus o Skin Prep o Skin Prep Wound #6 Medial Sacrum o Skin  Prep o Skin Prep Primary Wound Dressing Wound #1 Right,Proximal Back o Aquacel Ag Wound #4 Left Gluteus o Aquacel Ag Wound #5 Right Gluteus o Aquacel Ag Wound #6 Medial Sacrum o Aquacel Ag Secondary Dressing Wound #1 Right,Proximal Back o Boardered Foam Dressing Wound #4 Left Gluteus o Boardered Foam Dressing Wound #5 Right Gluteus o Boardered Foam Dressing Wound #6 Medial Sacrum o Boardered Foam Dressing Dressing Change Frequency Wound #1 Right,Proximal Back o Change dressing every other day. Wound #4 Left Gluteus o Change dressing  every other day. Wound #5 Right Gluteus o Change dressing every other day. [name], [name] ([id number]) Wound #6 Medial Sacrum o Change dressing every other day. Follow-up Appointments Wound #1 Right,Proximal Back o Return Appointment in 1 week. Wound #4 Left Gluteus o Return Appointment in 1 week. Wound #5 Right Gluteus o Return Appointment in 1 week. Wound #6 Medial Sacrum o Return Appointment in 1 week. Off-Loading Wound #1 Right,Proximal Back o Roho cushion for wheelchair o Turn and reposition every 2 hours Wound #4 Left Gluteus o Roho cushion for wheelchair o Turn and reposition every 2 hours Wound #5 Right Gluteus o Roho cushion for wheelchair o Turn and reposition every 2 hours Wound #6 Medial Sacrum o Roho cushion for wheelchair o Turn and reposition every 2 hours Additional Orders / Instructions Wound #1 Right,Proximal Back o Increase protein intake. o Other: - Apply to [facility] or [facility] Wound #4 Left Gluteus o Increase protein intake. o Other: - Apply to [facility] or [facility] Wound #5 Right Gluteus o Increase protein intake. o Other: - Apply to [facility] or [facility] Wound [address] JEOVANNY, CUADROS ([id number]) o Increase protein intake. o Other: - Apply to [facility] or [facility] Medications-please add to medication list. Wound #1 Right,Proximal Back o Other: - Vitamins A, C and Zinc Wound #4 Left Gluteus o Other: - Vitamins A, C and Zinc Wound #5 Right Gluteus o Other: - Vitamins A, C and Zinc Wound #6 Medial Sacrum o Other: - Vitamins A, C and Zinc Laboratory o Bacteria identified in Wound by Culture (MICRO) - right gluteus oooo LOINC Code: 6462-6 oooo Convenience Name: Wound culture routine Electronic Signature(s) Signed: 11/14/2016 5:33:43 PM By: Baltazar Najjar MD Signed: 11/14/2016  5:51:06 PM By: Alejandro Mulling Entered By: Alejandro Mulling on 11/14/2016 16:55:12 [name], [name] ([id number]) -------------------------------------------------------------------------------- Problem List Details Patient Name: [name], [name] Date of Service: 11/14/2016 3:30 PM Medical Record Number: [id number] Patient Account Number: [id number] Date of Birth/Sex: 22-Jul-1950 (66 y.o. Male) Treating RN: Primary Care Provider: Elizabeth Sauer Other Clinician: Referring Provider: Elizabeth Sauer Treating Provider/Extender: Maxwell Caul Weeks in Treatment: 31 Active Problems ICD-10 Encounter Code Description Active Date Diagnosis 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, stage 3 01/24/2016 Yes S14.103S Unspecified injury at C3 level of cervical spinal cord, 01/24/2016 Yes sequela S51.011A Laceration without foreign body of right elbow, initial 07/03/2016 Yes encounter L89.322 Pressure ulcer of left buttock, stage 2 08/14/2016 Yes L89.310 Pressure ulcer of right buttock, unstageable 09/18/2016 Yes L03.317 Cellulitis of buttock 09/18/2016 Yes Inactive Problems Resolved Problems ICD-10 Code Description Active Date Resolved Date L89.622 Pressure ulcer of left heel, stage 2 06/26/2016 06/26/2016 Electronic Signature(s) Signed: 11/14/2016 5:33:43 PM By: Baltazar Najjar MD Entered By: Baltazar Najjar on 11/14/2016 16:33:29 [name], [name] ([id number]) [name], [name] ([id number]) -------------------------------------------------------------------------------- Progress Note Details Patient Name: [name] Bras C. Date of Service: 11/14/2016 3:30 PM Medical Record Number: [id number] Patient Account  Number: [id number] Date of Birth/Sex: [birth year] (66 y.o. 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 He has lower extremity sensation and can move his legs but has no/limited control over his arms. His wife accompanies him today and states that in the late spring or early summer of 2017 the patient became very depressed. He refused the refused to mobilize and he developed several pressure ulcers on his back. Most of these have healed however they have a recalcitrant area over the right scapula. They've been using Santyl on this for at least the last month. In terms of depression the patient is doing better now on an antidepressant. He saw his primary physician on 01/12/16 at which time there was apparently green drainage coming out of this area [Dr. Deana Jones]. Dr. Yetta Barre works in the [facility]. He has completed this Septra. Otherwise looking [facility] notes that he has a history of seizures. He also had a stroke in 2008 he follows with neurology. He also has type 2 diabetes on Glucophage, hyperlipidemia and gastroesophageal reflux. He takes Plavix for stroke prevention and Keppra for seizure prophylaxis. 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 02/08/16; this is a patient with a pressure ulcer over the right scapula. His wife has been doing the dressing with Santyl and border foam change every second day. When he arrived here 2 weeks ago we did a fairly extensive mechanical debridement. We are asked medical modalities to go out to the home and see what they might be eligible for in terms of pressure-relief surfaces [level 2] but the wife states that they have not heard from them. 03/20/15; this is a patient we haven't seen in 5-6 weeks. This was largely due to transportation issues. They've been using Santyl and border foam changing every second day for a pressure  injury over the right scapula. The patient has a C3-C4 spinal injury. We had also asked for a review by the people who supplied DME to look at his wheelchair cushion, mattress etc. I don't know that this ever happened. In the meantime the wound has done remarkably well current measurements 1.8 x 2 x 0.1 04/03/16; the patient's dimensions have gone up to 3 cm in diameter quite a deterioration from last time. He is also complaining of pain in this area which is new. 04/10/16; 3 x 1.5 x 0.1. No difference from last week. Culture I did last week was negative he has completed antibiotics. 04/24/16 2.4 x 2 x 0.1; wound generally looks smaller. Middle area that I had to debrided last time looks healthier. His son is fashioned a large piece of foam cut out where the patient's wound with hit the back of his wheelchair 05/01/16; wound is a same size however the surface of this looks better. The middle innkeeper area required a repeat debridement 05/15/16; wound is down and dimensions granulation still looks healthy. The medial aspect of this wound is now the deeper Divot. We'll see how this responds to further healing. We're using Hydrofera Blue 06/05/16; Wound 1.7x1.1x0.1 still using hyudrofera blue 06/26/16; the patient arrives back in clinic after a three-week hiatus.  He was hospitalized from 06/09/16 through [facility] ([id number]) 06/10/16. He was found to be confused. CT scan of the head showed nothing really acute. His sodium was low at 131. He was rehydrated. He was felt to have a UTI and given antibiotics and antibiotics at discharge although his final culture result only showed multiple organisms. His wife says today that at the time of the hospitalization they discovered a large intact blister over the large aspect of his left calcaneus. This is recently ruptured. The wound on his right scapula is somewhat larger. More his wife is concerned about his current status. She states that he is still  confused sleeps for long periods. He is not having fever chills cough or diarrhea [1 loose bowel movement per day]. He continues to have a suprapubic catheter. She notes that he is not eating and drinking well. Patient states he just does not want to eat. He does not feel nauseated or vomit. 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 Admission sodium was 131 at discharge 139 BUN 22 and 1.22 at admission, 17 and 1 at discharge. 07/03/16; patient's mental status is back to normal. He has a new wound on the right elbow caused by traumatizing his elbow against the wall apparently at the dermatologist office last week. We continue with the original wound on the right scapula and the wound from 2 weeks ago on his left heel. We have been using silver alginate to the area on the heel. 08/14/16; patient has not been here in almost 6 weeks. When he was here last time he had the pressure ulcer on the right scapula, atraumatic wound on the right lateral elbow and an area on his left heel. His wife states that at one point all of these were healed and she didn't really feel he needed to come back here. Since then the patient has developed a reopening of the area on the right scapula, a stage II wound on the left buttock and a reopening of the area on the right lateral elbow. As usual his wife as a litany of complaints against home health, she is dismissing or is going to dismiss well care. She tells [place] she has a long list of supplies at home already for some reason they were not felt to be eligible for a group 2 or 3 surface although they have a hospital bed at home but no offloading surface 08/28/16; the patient arrived today unfortunately incontinent of stool in spite of this the wounds all appear to be better including the right scapular area, his left buttock's left heel and the right lateral elbow. 09/18/16; this is a  patient who arrived today for follow-up of a pressure area over his right scapula area, left buttock and there was an area on his right lateral elbow. He arrived in clinic today with a worrisome area over the right initial tuberosity/right buttock. This had a necrotic surface with draining purulence. He has not been systemically unwell. The area over the left elbow healed. He arrived in clinic today with dressing that hadn't been changed over the scapula for 2 or 3 days per her intake. His wife is previously fired home health 09/25/16;; the last time the patient was here he arrived with a new grossly infected wound over the right ischial tuberosity. Culture I did not show a specific pathogen. I did think this required admission to hospital and  he was admitted from 09/18/16 through 09/20/16. Initially given vanc and Zosyn but then discharged with 5 days' worth of Augmentin. An MRI was done that did not show osteomyelitis of the right ischial tuberosity however it did show cellulitis and possible myositis. He also has wounds on the left ischial tuberosity and the area over the right scapular area. 10/04/16 on evaluation today patient appears to be doing better in regard to his back wound and is stable in regard to the gluteal wounds. There does not appear to be any evidence of significant infection at this point. He is tolerating the dressing changes currently. His wife has been performing the dressing changes. Nonetheless he does have some discomfort especially in the gluteal areas although he did not specifically rate the discomfort there were times during evaluation where he flinched and it was obvious it was bothering him. No fevers, chills, nausea, or vomiting noted at this time. 10/17/16; the patient has bilateral ischial tuberosity wounds right greater than left. The left seems to be making good progress wears the right has not really changed that much and dimensions. There is also some threatened  area on the right side around the wound circumference. Equally worrisome there is a DTI over the lower sacral area however this is not open as of yet. His area over the left scapula which is the [facility]. ([id number]) chronic wound he was coming to the clinic continues to make excellent progress 8/28 The patient comes in today wanting to talk about getting back in his wheelchair. He is very bored lying in bed at home 11/14/16; three-week hiatus for this patient for medical illnesses whether etc. He was in the emergency room at [facility] 2 days ago when his wife noted odor coming out of the right initial tuberosity wound and discoloration. I reviewed his ER presentation from 11/12/16. White count was 11.7 differential count reasonably normal basic metabolic panel was normal. Serum lactate was 1.9. He has a suprapubic catheter which showed positive urine nitrate large leukocytes and many bacteria. Urine cultures come back showing multiple species. Blood culture was negative. Wound culture is still pending. He was put on Keflex and Bactrim. The patient had an MRI of this area on 09/18/16 that did not show osteomyelitis however at that time there was possible cellulitis and infectious myositis no drainable fluid collection. After this he wishes admitted the hospital had vancomycin and Zosyn for 5 days and then 5 days of Augmentin Objective Constitutional Patient is hypotensive. Not out of keeping with his usual he does not appear to be actively septic. Pulse regular and within target range for patient.Marland Kitchen Respirations regular, non-labored and within target range.. Temperature is normal and within the target range for the patient.Marland Kitchen appears in no distress. Alert and mentating. Vitals Time Taken: 3:45 PM, Height: 70 in, Weight: 187 lbs, BMI: 26.8, Temperature: 97 F, Pulse: 81 bpm, Respiratory Rate: 16 breaths/min, Blood Pressure: 72/46 mmHg. General Notes: Wife states this BP is normal for pt. MD  was notified of BP as well. General Notes: Wound exam; nothing good about this today. The area over the right ischial tuberosity is deteriorating 50% of this necrotic material. Using a #5 curet I removed necrotic subcutaneous and muscle and I got down to a reasonable base of the wound however the circumference is red and necrotic. I suspect there is ongoing and underlying infection here. The area over the left initial tuberosity is superficial but appears to have some developing deep to the tissue  injury Over the left. Pelvis but appears to be a deep tissue injury as well as over the right greater trochanter The area over his right scapula which is original wound is not healed but appears to be reasonably healthy and is not a major clinical issue at the moment Integumentary (Hair, Skin) Wound #1 status is Open. Original cause of wound was Pressure Injury. The wound is located on the Right,Proximal Back. The wound measures 1.2cm length x 1.4cm width x 0.1cm depth; 1.319cm^2 area and 0.132cm^3 volume. There is Fat Layer (Subcutaneous Tissue) Exposed exposed. There is no tunneling [name], [name] ([id number]) or undermining noted. There is a large amount of serosanguineous drainage noted. The wound margin is flat and intact. There is large (67-100%) pink granulation within the wound bed. There is a small (1-33%) amount of necrotic tissue within the wound bed including Eschar. The periwound skin appearance exhibited: Induration, Erythema. The surrounding wound skin color is noted with erythema which is circumferential. Periwound temperature was noted as No Abnormality. The periwound has tenderness on palpation. Wound #4 status is Open. Original cause of wound was Pressure Injury. The wound is located on the Left Gluteus. The wound measures 0.5cm length x 0.5cm width x 0.1cm depth; 0.196cm^2 area and 0.02cm^3 volume. There is no tunneling or undermining noted. There is a large amount of  serosanguineous drainage noted. Foul odor after cleansing was noted. The wound margin is distinct with the outline attached to the wound base. There is no granulation within the wound bed. There is a large (67-100%) amount of necrotic tissue within the wound bed including Eschar and Adherent Slough. The periwound skin appearance exhibited: Maceration, Ecchymosis, Erythema. The surrounding wound skin color is noted with erythema which is circumferential. Periwound temperature was noted as No Abnormality. The periwound has tenderness on palpation. Wound #5 status is Open. Original cause of wound was Pressure Injury. The wound is located on the Right Gluteus. The wound measures 3cm length x 3.5cm width x 0.6cm depth; 8.247cm^2 area and 4.948cm^3 volume. There is Fat Layer (Subcutaneous Tissue) Exposed exposed. There is no tunneling noted, however, there is undermining starting at 4:00 and ending at 8:00 with a maximum distance of 0.7cm. There is a large amount of serosanguineous drainage noted. Foul odor after cleansing was noted. The wound margin is flat and intact. There is no granulation within the wound bed. There is a large (67-100%) amount of necrotic tissue within the wound bed including Eschar and Adherent Slough. The periwound skin appearance exhibited: Excoriation, Scarring, Ecchymosis, Erythema. The periwound skin appearance did not exhibit: Callus, Crepitus, Induration, Rash, Dry/Scaly, Maceration, Atrophie Blanche, Cyanosis, Hemosiderin Staining, Mottled, Pallor, Rubor. The surrounding wound skin color is noted with erythema which is circumferential. Periwound temperature was noted as No Abnormality. The periwound has tenderness on palpation. Wound #6 status is Open. Original cause of wound was Pressure Injury. The wound is located on the Medial Sacrum. The wound measures 1cm length x 3.2cm width x 0.1cm depth; 2.513cm^2 area and 0.251cm^3 volume. The wound is limited to skin  breakdown. There is no tunneling or undermining noted. There is a large amount of serous drainage noted. The wound margin is indistinct and nonvisible. There is no granulation within the wound bed. There is a large (67-100%) amount of necrotic tissue within the wound bed including Adherent Slough. The periwound skin appearance exhibited: Ecchymosis, Erythema. The periwound skin appearance did not exhibit: Callus, Crepitus, Excoriation, Induration, Rash, Scarring, Dry/Scaly, Maceration, Atrophie Blanche, Cyanosis, Hemosiderin Staining, Mottled,  Pallor, Rubor. The surrounding wound skin color is noted with erythema which is circumferential. Erythema is measured at 6 cm. Periwound temperature was noted as No Abnormality. The periwound has tenderness on palpation. 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 ([id number]) S14.103S - Unspecified injury at C3 level of cervical spinal cord, sequela S51.011A - Laceration without foreign body of right elbow, initial encounter L89.322 - Pressure ulcer of left buttock, stage 2 L89.310 - Pressure ulcer of right buttock, unstageable L03.317 - Cellulitis of buttock Procedures Wound #5 Pre-procedure diagnosis of Wound #5 is a Pressure Ulcer located on the Right Gluteus . There was a Skin/Subcutaneous Tissue Debridement ([id number]) debridement with total area of 10.5 sq cm performed by Maxwell Caul, MD. with the following instrument(s): Curette to remove Viable and Non-Viable tissue/material including Exudate, Fibrin/Slough, Muscle, and Subcutaneous after achieving pain control using Lidocaine 4% Topical Solution. 1 Specimen was taken by a Swab and sent to the lab per facility protocol.A time out was conducted at 16:08, prior to the start of the procedure. A Minimum amount of bleeding was controlled with Pressure. The procedure was tolerated well with a pain level of 0 throughout and a pain level  of 0 following the procedure. Post Debridement Measurements: 3cm length x 3.5cm width x 0.9cm depth; 7.422cm^3 volume. Post debridement Stage noted as Unstageable/Unclassified. Character of Wound/Ulcer Post Debridement requires further debridement. Post procedure Diagnosis Wound #5: Same as Pre-Procedure Plan Wound Cleansing: Wound #1 Right,Proximal Back: Clean wound with Normal Saline. Cleanse wound with mild soap and water Wound #4 Left Gluteus: Clean wound with Normal Saline. Cleanse wound with mild soap and water Cleanse wound with mild soap and water Wound #5 Right Gluteus: Clean wound with Normal Saline. Cleanse wound with mild soap and water Cleanse wound with mild soap and water Wound #6 Medial Sacrum: Clean wound with Normal Saline. Cleanse wound with mild soap and water Cleanse wound with mild soap and water Anesthetic: NYHEIM, SEUFERT ([id number]) Wound #1 Right,Proximal Back: Topical Lidocaine 4% cream applied to wound bed prior to debridement Wound #4 Left Gluteus: Topical Lidocaine 4% cream applied to wound bed prior to debridement Wound #5 Right Gluteus: Topical Lidocaine 4% cream applied to wound bed prior to debridement Wound #6 Medial Sacrum: Topical Lidocaine 4% cream applied to wound bed prior to debridement Skin Barriers/Peri-Wound Care: Wound #1 Right,Proximal Back: Skin Prep Wound #4 Left Gluteus: Skin Prep Skin Prep Wound #5 Right Gluteus: Skin Prep Skin Prep Wound #6 Medial Sacrum: Skin Prep Skin Prep Primary Wound Dressing: Wound #1 Right,Proximal Back: Aquacel Ag Wound #4 Left Gluteus: Aquacel Ag Wound #5 Right Gluteus: Aquacel Ag Wound #6 Medial Sacrum: Aquacel Ag Secondary Dressing: Wound #1 Right,Proximal Back: Boardered Foam Dressing Wound #4 Left Gluteus: Boardered Foam Dressing Wound #5 Right Gluteus: Boardered Foam Dressing Wound #6 Medial Sacrum: Boardered Foam Dressing Dressing Change Frequency: Wound #1 Right,Proximal  Back: Change dressing every other day. Wound #4 Left Gluteus: Change dressing every other day. Wound #5 Right Gluteus: Change dressing every other day. Wound #6 Medial Sacrum: Change dressing every other day. Follow-up Appointments: Wound #1 Right,Proximal Back: Return Appointment in 1 week. Wound #4 Left Gluteus: Norden, Allex C. ([id number]) Return Appointment in 1 week. Wound #5 Right Gluteus: Return Appointment in 1 week. Wound #6 Medial Sacrum: Return Appointment in 1 week. Off-Loading: Wound #1 Right,Proximal Back: Roho cushion for wheelchair Turn and reposition every 2 hours Wound #4 Left Gluteus: Roho cushion  for wheelchair Turn and reposition every 2 hours Wound #5 Right Gluteus: Roho cushion for wheelchair Turn and reposition every 2 hours Wound #6 Medial Sacrum: Roho cushion for wheelchair Turn and reposition every 2 hours Additional Orders / Instructions: Wound #1 Right,Proximal Back: Increase protein intake. Other: - Vitamins A, C and Zinc Wound #4 Left Gluteus: Increase protein intake. Other: - Vitamins A, C and Zinc Wound #5 Right Gluteus: Increase protein intake. Other: - Vitamins A, C and Zinc Wound #6 Medial Sacrum: Increase protein intake. Other: - Vitamins A, C and Zinc Laboratory ordered were: Wound culture routine - right gluteus #1 post debridement I did a culture of underlying deep muscle. I did not alter Keflex and Septra for now although in my experience infections in this area are often gram-negative rods #2 deep pressure areas over the right greater trochanter upper left pelvis and on the wound on the left visual tuberosity suggest inadequate pressure relief in spite of everything this patient and his wife are doing at home. They state he is not up in his wheelchair except for appointments #3 I don't think this is going to go well. Instead of the hospital and nursing home route I wonder about LTAC. We gave them information about [facility] [place] and [facility], [facility] ([id number]) #4 I will see them next week, look forward to the culture result probably on Monday Electronic Signature(s) Signed: 11/14/2016 5:33:43 PM By: Baltazar Najjar MD Entered By: Baltazar Najjar on 11/14/2016 16:51:05 Krider, [name] ([id number]) -------------------------------------------------------------------------------- SuperBill Details Patient Name: [name] Bras C. Date of Service: 11/14/2016 Medical Record Number: [id number] Patient Account Number: [id number] Date of Birth/Sex: November 07, 1950 (66 y.o. Male) Treating RN: Primary Care Provider: Elizabeth Sauer Other Clinician: Referring Provider: Elizabeth Sauer Treating Provider/Extender: Maxwell Caul Weeks in Treatment: 42 Diagnosis Coding ICD-10 Codes Code Description [phone] Pressure ulcer of other site, stage 3 S14.103S Unspecified injury at C3 level of cervical spinal cord, sequela S51.011A Laceration without foreign body of right elbow, initial encounter L89.322 Pressure ulcer of left buttock, stage 2 L89.310 Pressure ulcer of right buttock, unstageable L03.317 Cellulitis of buttock Facility Procedures CPT4 Code: [id number] Description: 11043 - DEB MUSC/FASCIA 20 SQ CM/< ICD-10 Description Diagnosis L89.310 Pressure ulcer of right buttock, unstageable Modifier: Quantity: 1 Physician Procedures CPT4 Code: [id number] Description: 11043 - WC PHYS DEBR MUSCLE/FASCIA 20 SQ CM ICD-10 Description Diagnosis L89.310 Pressure ulcer of right buttock, unstageable Modifier: Quantity: 1 Electronic Signature(s) Signed: 11/14/2016 5:33:43 PM By: Baltazar Najjar MD Entered By: Baltazar Najjar on 11/14/2016 16:51:41

## 2016-11-16 NOTE — Progress Notes (Signed)
TYRIE, PORZIO ([id number]) Visit Report for 11/14/2016 Arrival Information Details Patient Name: [name], PURK. Date of Service: 11/14/2016 3:30 PM Medical Record Number: [id number] Patient Account Number: [id number] Date of Birth/Sex: [birth year] (66 y.o. Male) Treating RN: [name] Primary Care [name]: [name] Other Clinician: Referring [name]: [name] Treating [place] Mielke/Extender: [name] in Treatment: 97 Visit Information History Since Last Visit All ordered tests and consults were completed: No Patient Arrived: Wheel Chair Added or deleted any medications: No Arrival Time: 15:38 Any new allergies or adverse reactions: No Accompanied By: wife Had a fall or experienced change in No activities of daily living that may affect Transfer Assistance: [name] risk of falls: Patient Identification Verified: Yes Signs or symptoms of abuse/neglect since last No Secondary Verification Process Yes visito Completed: Hospitalized since last visit: No Patient Requires Transmission-Based No Has Dressing in Place as Prescribed: Yes Precautions: Pain Present Now: No Patient Has Alerts: Yes Electronic Signature(s) Signed: 11/14/2016 5:51:06 PM By: Alric Quan Entered By: Alric Quan on 11/14/2016 15:44:50 [name], [name] ([id number]) -------------------------------------------------------------------------------- Encounter Discharge Information Details Patient Name: [name] Coast C. Date of Service: 11/14/2016 3:30 PM Medical Record Number: [id number] Patient Account Number: [id number] Date of Birth/Sex: August 27, 1950 (66 y.o. Male) Treating RN: Primary Care [name]: [name] Other Clinician: Referring [name]: [name] Treating Calysta Craigo/Extender: [name] in Treatment: 62 Encounter Discharge Information Items Discharge Pain Level: 0 Discharge Condition: Stable Ambulatory Status: Wheelchair Discharge  Destination: Home Transportation: Private Auto Accompanied By: wife Schedule Follow-up Appointment: Yes Medication Reconciliation completed and provided to Patient/Care No [name]: Provided on Clinical Summary of Care: 11/14/2016 Form Type Recipient Paper Patient [facility] Electronic Signature(s) Signed: 11/14/2016 5:51:06 PM By: Alric Quan Entered By: Alric Quan on 11/14/2016 16:56:34 [name], [name] ([id number]) -------------------------------------------------------------------------------- Lower Extremity Assessment Details Patient Name: [name], [name] Date of Service: 11/14/2016 3:30 PM Medical Record Number: [id number] Patient Account Number: [id number] Date of Birth/Sex: 04/06/1950 (66 y.o. Male) Treating RN: [name] Primary Care [name]: [name] Other Clinician: Referring [name]: [name] Treating Taron Mondor/Extender: [name] Weeks in Treatment: 42 Electronic Signature(s) Signed: 11/14/2016 5:51:06 PM By: Alric Quan Entered By: Alric Quan on 11/14/2016 16:04:28 [name], [name] ([id number]) -------------------------------------------------------------------------------- Multi Wound Chart Details Patient Name: [name], [name] Date of Service: 11/14/2016 3:30 PM Medical Record Number: [id number] Patient Account Number: [id number] Date of Birth/Sex: 22-May-1950 (66 y.o. Male) Treating RN: [name] Primary Care [name]: [name] Other Clinician: Referring [name]: [name] Treating [name]/Extender: [name] Weeks in Treatment: 42 Vital Signs Height(in): 70 Pulse(bpm): 81 Weight(lbs): 187 Blood Pressure 72/46 (mmHg): Body Mass Index(BMI): 27 Temperature(F): 97 Respiratory Rate 16 (breaths/min): Photos: [1:No Photos] [4:No Photos] [5:No Photos] Wound Location: [1:Right Back - Proximal] [4:Left Gluteus] [5:Right Gluteus] Wounding Event: [1:Pressure Injury] [4:Pressure Injury]  [5:Pressure Injury] Primary Etiology: [1:Pressure Ulcer] [4:Pressure Ulcer] [5:Pressure Ulcer] Comorbid History: [1:Coronary Artery Disease, Type II Diabetes, Quadriplegia, Seizure Disorder] [4:Coronary Artery Disease, Type II Diabetes, Quadriplegia, Seizure Disorder] [5:Coronary Artery Disease, Type II Diabetes, Quadriplegia, Seizure Disorder] Date Acquired: [1:11/24/2015] [4:07/09/2016] [5:09/10/2016] Weeks of Treatment: [1:42] [4:13] [5:8] Wound Status: [1:Open] [4:Open] [5:Open] Measurements L x W x D 1.2x1.4x0.1 [4:0.5x0.5x0.1] [5:3x3.5x0.6] (cm) Area (cm) : [1:1.319] [4:0.196] [5:8.247] Volume (cm) : [1:0.132] [4:0.02] [5:4.948] % Reduction in Area: [1:83.60%] [4:-24.80%] [5:-45.80%] % Reduction in Volume: 83.60% [4:-25.00%] [5:-775.80%] Starting Position 1 [5:4] (o'clock): Ending Position 1 [5:8] (o'clock): Maximum Distance 1 [5:0.7] (cm): Undermining: [1:No] [4:No] [5:Yes] Classification: [1:Category/Stage II] [4:Category/Stage II] [5:Unstageable/Unclassified] Exudate Amount: [  1:Large] [4:Large] [5:Large] Exudate Type: [1:Serosanguineous] [4:Serosanguineous] [5:Serosanguineous] Exudate Color: [1:red, brown] [4:red, brown] [5:red, brown] Foul Odor After [1:No] [4:Yes] [5:Yes] Cleansing: [name], [name] Odor Anticipated Due to N/A No No Product Use: Wound Margin: Flat and Intact Distinct, outline attached Flat and Intact Granulation Amount: Large (67-100%) None Present (0%) None Present (0%) Granulation Quality: Pink N/A N/A Necrotic Amount: Small (1-33%) Large (67-100%) Large (67-100%) Necrotic Tissue: Eschar Eschar, Adherent Slough Eschar, Adherent Slough Exposed Structures: Fat Layer (Subcutaneous N/A Fat Layer (Subcutaneous Tissue) Exposed: Yes Tissue) Exposed: Yes Fascia: No Fascia: No Tendon: No Tendon: No Muscle: No Muscle: No Joint: No Joint: No Bone: No Bone: No Epithelialization: None None None Debridement: N/A N/A Debridement  ([id number]) Pre-procedure N/A N/A 16:08 Verification/Time Out Taken: Pain Control: N/A N/A Lidocaine 4% Topical Solution Tissue Debrided: N/A N/A Fibrin/Slough, Exudates, Subcutaneous Level: N/A N/A Skin/Subcutaneous Tissue Debridement Area (sq N/A N/A 10.5 cm): Instrument: N/A N/A Curette Specimen: N/A N/A Swab Number of Specimens N/A N/A 1 Taken: Bleeding: N/A N/A Minimum Hemostasis Achieved: N/A N/A Pressure Procedural Pain: N/A N/A 0 Post Procedural Pain: N/A N/A 0 Debridement Treatment N/A N/A Procedure was tolerated Response: well Post Debridement N/A N/A 3x3.5x0.9 Measurements L x W x D (cm) Post Debridement N/A N/A 7.422 Volume: (cm) Post Debridement N/A N/A Unstageable/Unclassified Stage: Periwound Skin Texture: Induration: Yes No Abnormalities Noted Excoriation: Yes Scarring: Yes Induration: No Callus: No [name], [name]. ([id number]) Crepitus: No Rash: No Periwound Skin No Abnormalities Noted Maceration: Yes Maceration: No Moisture: Dry/Scaly: No Periwound Skin Color: Erythema: Yes Ecchymosis: Yes Ecchymosis: Yes Erythema: Yes Erythema: Yes Atrophie Blanche: No Cyanosis: No Hemosiderin Staining: No Mottled: No Pallor: No Rubor: No Erythema Location: Circumferential Circumferential Circumferential Erythema Measurement: N/A N/A N/A Temperature: No Abnormality No Abnormality No Abnormality Tenderness on Yes Yes Yes Palpation: Wound Preparation: Ulcer Cleansing: Ulcer Cleansing: Ulcer Cleansing: Rinsed/Irrigated with Rinsed/Irrigated with Rinsed/Irrigated with Saline Saline Saline Topical Anesthetic Topical Anesthetic Topical Anesthetic Applied: Other: lidocaine Applied: Other: lidocaine Applied: Other: lidocaine 4% 4% 4% Procedures Performed: N/A N/A Debridement Wound Number: 6 N/A N/A Photos: No Photos N/A N/A Wound Location: Sacrum - Medial N/A N/A Wounding Event: Pressure Injury N/A N/A Primary Etiology: Pressure Ulcer N/A  N/A Comorbid History: Coronary Artery Disease, N/A N/A Type II Diabetes, Quadriplegia, Seizure Disorder Date Acquired: 10/16/2016 N/A N/A Weeks of Treatment: 3 N/A N/A Wound Status: Open N/A N/A Measurements L x W x D 1x3.2x0.1 N/A N/A (cm) Area (cm) : 2.513 N/A N/A Volume (cm) : 0.251 N/A N/A % Reduction in Area: 88.10% N/A N/A % Reduction in Volume: 88.20% N/A N/A Undermining: No N/A N/A Classification: Category/Stage II N/A N/A Exudate Amount: Large N/A N/A Exudate Type: Serous N/A N/A Exudate Color: amber N/A N/A Brickell, [place] C. ([id number]) Foul Odor After No N/A N/A Cleansing: Odor Anticipated Due to N/A N/A N/A Product Use: Wound Margin: Indistinct, nonvisible N/A N/A Granulation Amount: None Present (0%) N/A N/A Granulation Quality: N/A N/A N/A Necrotic Amount: Large (67-100%) N/A N/A Necrotic Tissue: Adherent Slough N/A N/A Exposed Structures: Fascia: No N/A N/A Fat Layer (Subcutaneous Tissue) Exposed: No Tendon: No Muscle: No Joint: No Bone: No Limited to Skin Breakdown Epithelialization: Large (67-100%) N/A N/A Debridement: N/A N/A N/A Pain Control: N/A N/A N/A Tissue Debrided: N/A N/A N/A Level: N/A N/A N/A Debridement Area (sq N/A N/A N/A cm): Instrument: N/A N/A N/A Bleeding: N/A N/A N/A Hemostasis Achieved: N/A N/A N/A Procedural Pain: N/A N/A N/A Post Procedural Pain: N/A N/A  N/A Debridement Treatment N/A N/A N/A Response: Post Debridement N/A N/A N/A Measurements L x W x D (cm) Post Debridement N/A N/A N/A Volume: (cm) Post Debridement N/A N/A N/A Stage: Periwound Skin Texture: Excoriation: No N/A N/A Induration: No Callus: No Crepitus: No Rash: No Scarring: No Periwound Skin Maceration: No N/A N/A Moisture: Dry/Scaly: No Periwound Skin Color: Ecchymosis: Yes N/A N/A Erythema: Yes Atrophie Blanche: No [name], [name] ([id number]) Cyanosis: No Hemosiderin Staining: No Mottled: No Pallor: No Rubor: No Erythema  Location: Circumferential N/A N/A Erythema Measurement: Measured: 6cm N/A N/A Temperature: No Abnormality N/A N/A Tenderness on Yes N/A N/A Palpation: Wound Preparation: Ulcer Cleansing: N/A N/A Rinsed/Irrigated with Saline Topical Anesthetic Applied: Other: lidocaine 4% Procedures Performed: N/A N/A N/A Treatment Notes Electronic Signature(s) Signed: 11/14/2016 5:33:43 PM By: Linton Ham MD Entered By: Linton Ham on 11/14/2016 16:33:41 [name], [name] ([id number]) -------------------------------------------------------------------------------- Multi-Disciplinary Care Plan Details Patient Name: [name], PLOTKIN C. Date of Service: 11/14/2016 3:30 PM Medical Record Number: [id number] Patient Account Number: [id number] Date of Birth/Sex: Apr 07, 1950 (66 y.o. Male) Treating RN: [name] Primary Care Arlanda Shiplett: [name] Other Clinician: Referring Randell Detter: [name] Treating Jazzman Loughmiller/Extender: [name] Weeks in Treatment: 68 Active Inactive ` Nutrition Nursing Diagnoses: Imbalanced nutrition Impaired glucose control: actual or potential Goals: Patient/caregiver agrees to and verbalizes understanding of need to use nutritional supplements and/or vitamins as prescribed Date Initiated: 01/24/2016 Target Resolution Date: 03/27/2016 Goal Status: Active Patient/caregiver will maintain therapeutic glucose control Date Initiated: 01/24/2016 Target Resolution Date: 03/27/2016 Goal Status: Active Interventions: Assess patient nutrition upon admission and as needed per policy Provide education on elevated blood sugars and impact on wound healing Notes: ` Orientation to the Wound Care Program Nursing Diagnoses: Knowledge deficit related to the wound healing center program Goals: Patient/caregiver will verbalize understanding of the [place] Date Initiated: 01/24/2016 Target Resolution Date: 03/27/2016 Goal Status:  Active Interventions: Provide education on orientation to the wound center Notes: ANDYN, SALES ([id number]) ` Pain, Acute or Chronic Nursing Diagnoses: Pain, acute or chronic: actual or potential Potential alteration in comfort, pain Goals: Patient will verbalize adequate pain control and receive pain control interventions during procedures as needed Date Initiated: 01/24/2016 Target Resolution Date: 03/27/2016 Goal Status: Active Patient/caregiver will verbalize adequate pain control between visits Date Initiated: 01/24/2016 Target Resolution Date: 03/27/2016 Goal Status: Active Patient/caregiver will verbalize comfort level met Date Initiated: 01/24/2016 Target Resolution Date: 03/27/2016 Goal Status: Active Interventions: Assess comfort goal upon admission Complete pain assessment as per visit requirements Notes: ` Pressure Nursing Diagnoses: Knowledge deficit related to management of pressures ulcers Potential for impaired tissue integrity related to pressure, friction, moisture, and shear Goals: Patient will remain free from development of additional pressure ulcers Date Initiated: 01/24/2016 Target Resolution Date: 03/27/2016 Goal Status: Active Interventions: Assess: immobility, friction, shearing, incontinence upon admission and as needed Assess offloading mechanisms upon admission and as needed Assess potential for pressure ulcer upon admission and as needed Provide education on pressure ulcers Notes: [name], [name] ([id number]) Wound/Skin Impairment Nursing Diagnoses: Impaired tissue integrity Goals: Ulcer/skin breakdown will have a volume reduction of 30% by week 4 Date Initiated: 01/24/2016 Target Resolution Date: 03/27/2016 Goal Status: Active Ulcer/skin breakdown will have a volume reduction of 50% by week 8 Date Initiated: 01/24/2016 Target Resolution Date: 03/27/2016 Goal Status: Active Ulcer/skin breakdown will have a volume reduction of  80% by week 12 Date Initiated: 01/24/2016 Target Resolution Date: 03/27/2016 Goal Status: Active Interventions: Assess patient/caregiver ability to perform ulcer/skin  care regimen upon admission and as needed Assess ulceration(s) every visit Notes: Electronic Signature(s) Signed: 11/14/2016 5:51:06 PM By: Alric Quan Entered By: Alric Quan on 11/14/2016 16:04:34 [place], [name] ([id number]) -------------------------------------------------------------------------------- Pain Assessment Details Patient Name: Dulski, Lebert C. Date of Service: 11/14/2016 3:30 PM Medical Record Number: [id number] Patient Account Number: [id number] Date of Birth/Sex: 1951/01/04 (66 y.o. Male) Treating RN: [name] Primary Care Miyuki Rzasa: [name] Other Clinician: Referring Kuuipo Anzaldo: [name] Treating Jaxyn Rout/Extender: [name] Weeks in Treatment: 50 Active Problems Location of Pain Severity and Description of Pain Patient Has Paino No Site Locations Pain Management and Medication Current Pain Management: Electronic Signature(s) Signed: 11/14/2016 5:51:06 PM By: Alric Quan Entered By: Alric Quan on 11/14/2016 15:44:57 Harp, [name] ([id number]) -------------------------------------------------------------------------------- Patient/Caregiver Education Details Patient Name: [name]. Date of Service: 11/14/2016 3:30 PM Medical Record Number: [id number] Patient Account Number: [id number] Date of Birth/Gender: 04/21/1950 (66 y.o. Male) Treating RN: [name] Primary Care Physician: [name] Other Clinician: Referring Physician: Otilio [name] Treating Physician/Extender: [name] in Treatment: 27 Education Assessment Education Provided To: Patient Education Topics Provided Wound/Skin Impairment: Handouts: Other: change dressing as ordered Methods: Demonstration, Explain/Verbal Responses: State content  correctly Electronic Signature(s) Signed: 11/14/2016 5:51:06 PM By: Alric Quan Entered By: Alric Quan on 11/14/2016 16:56:43 [place], [name] ([id number]) -------------------------------------------------------------------------------- Wound Assessment Details Patient Name: [name] Coast C. Date of Service: 11/14/2016 3:30 PM Medical Record Number: [id number] Patient Account Number: [id number] Date of Birth/Sex: Jun 19, 1950 (66 y.o. Male) Treating RN: [name] Primary Care Kruz Chiu: [name] Other Clinician: Referring Brittne Kawasaki: [name] Treating Virl Coble/Extender: [name] Weeks in Treatment: 42 Wound Status Wound Number: 1 Primary Pressure Ulcer Etiology: Wound Location: Right Back - Proximal Wound Open Wounding Event: Pressure Injury Status: Date Acquired: 11/24/2015 Comorbid Coronary Artery Disease, Type II Weeks Of Treatment: 42 History: Diabetes, Quadriplegia, Seizure Clustered Wound: No Disorder Photos Photo Uploaded By: Alric Quan on 11/14/2016 17:42:00 Wound Measurements Length: (cm) 1.2 Width: (cm) 1.4 Depth: (cm) 0.1 Area: (cm) 1.319 Volume: (cm) 0.132 % Reduction in Area: 83.6% % Reduction in Volume: 83.6% Epithelialization: None Tunneling: No Undermining: No Wound Description Classification: Category/Stage II Wound Margin: Flat and Intact Exudate Amount: Large Exudate Type: Serosanguineous Exudate Color: red, brown Foul Odor After Cleansing: No Slough/Fibrino Yes Wound Bed Granulation Amount: Large (67-100%) Exposed Structure Granulation Quality: Pink Fascia Exposed: No Necrotic Amount: Small (1-33%) Fat Layer (Subcutaneous Tissue) Exposed: Yes Necrotic Quality: Eschar Tendon Exposed: No Kirchman, Treshaun C. ([id number]) Muscle Exposed: No Joint Exposed: No Bone Exposed: No Periwound Skin Texture Texture Color No Abnormalities Noted: No No Abnormalities Noted: No Induration: Yes Erythema:  Yes Erythema Location: Circumferential Moisture No Abnormalities Noted: No Temperature / Pain Temperature: No Abnormality Tenderness on Palpation: Yes Wound Preparation Ulcer Cleansing: Rinsed/Irrigated with Saline Topical Anesthetic Applied: Other: lidocaine 4%, Treatment Notes Wound #1 (Right, Proximal Back) 1. Cleansed with: Clean wound with Normal Saline 2. Anesthetic Topical Lidocaine 4% cream to wound bed prior to debridement 3. Peri-wound Care: Skin Prep 4. Dressing Applied: Aquacel Ag 5. Secondary Dressing Applied Bordered Foam Dressing Electronic Signature(s) Signed: 11/14/2016 5:51:06 PM By: Alric Quan Entered By: Alric Quan on 11/14/2016 16:02:21 [place], [name] ([id number]) -------------------------------------------------------------------------------- Wound Assessment Details Patient Name: Batterson, Lenford C. Date of Service: 11/14/2016 3:30 PM Medical Record Number: [id number] Patient Account Number: [id number] Date of Birth/Sex: 03-20-1950 (66 y.o. Male) Treating RN: [name] Primary Care Andreia Gandolfi: [name] Other Clinician: Referring Malley Hauter: [name] Treating Attilio Zeitler/Extender: [name]  Weeks in Treatment: 42 Wound Status Wound Number: 4 Primary Pressure Ulcer Etiology: Wound Location: Left Gluteus Wound Open Wounding Event: Pressure Injury Status: Date Acquired: 07/09/2016 Comorbid Coronary Artery Disease, Type II Weeks Of Treatment: 13 History: Diabetes, Quadriplegia, Seizure Clustered Wound: No Disorder Photos Photo Uploaded By: Alric Quan on 11/14/2016 17:42:00 Wound Measurements Length: (cm) 0.5 Width: (cm) 0.5 Depth: (cm) 0.1 Area: (cm) 0.196 Volume: (cm) 0.02 % Reduction in Area: -24.8% % Reduction in Volume: -25% Epithelialization: None Tunneling: No Undermining: No Wound Description Classification: Category/Stage II Foul Odor Aft Wound Margin: Distinct, outline attached Due to  Produc Exudate Amount: Large Slough/Fibrin Exudate Type: Serosanguineous Exudate Color: red, brown er Cleansing: Yes t Use: No o Yes Wound Bed Granulation Amount: None Present (0%) Necrotic Amount: Large (67-100%) Necrotic Quality: Eschar, Adherent [address] [place] C. ([id number]) Periwound Skin Texture Texture Color No Abnormalities Noted: No No Abnormalities Noted: No Ecchymosis: Yes Moisture Erythema: Yes No Abnormalities Noted: No Erythema Location: Circumferential Maceration: Yes Temperature / Pain Temperature: No Abnormality Tenderness on Palpation: Yes Wound Preparation Ulcer Cleansing: Rinsed/Irrigated with Saline Topical Anesthetic Applied: Other: lidocaine 4%, Treatment Notes Wound #4 (Left Gluteus) 1. Cleansed with: Clean wound with Normal Saline 2. Anesthetic Topical Lidocaine 4% cream to wound bed prior to debridement 3. Peri-wound Care: Skin Prep 4. Dressing Applied: Aquacel Ag 5. Secondary Dressing Applied Bordered Foam Dressing Electronic Signature(s) Signed: 11/14/2016 5:51:06 PM By: Alric Quan Entered By: Alric Quan on 11/14/2016 16:03:06 [place], [name] ([id number]) -------------------------------------------------------------------------------- Wound Assessment Details Patient Name: Holsomback, Eligah C. Date of Service: 11/14/2016 3:30 PM Medical Record Number: [id number] Patient Account Number: [id number] Date of Birth/Sex: [birth year] (66 y.o. Male) Treating RN: [name] Primary Care Rowan Blaker: [name] Other Clinician: Referring Riad Wagley: [name] Treating Zienna Ahlin/Extender: [name] Weeks in Treatment: 42 Wound Status Wound Number: 5 Primary Pressure Ulcer Etiology: Wound Location: Right Gluteus Wound Open Wounding Event: Pressure Injury Status: Date Acquired: 09/10/2016 Comorbid Coronary Artery Disease, Type II Weeks Of Treatment: 8 History: Diabetes, Quadriplegia, Seizure Clustered Wound:  No Disorder Photos Photo Uploaded By: Alric Quan on 11/14/2016 17:42:34 Wound Measurements Length: (cm) 3 % Reduction in Width: (cm) 3.5 % Reduction in Depth: (cm) 0.6 Epithelializati Area: (cm) 8.247 Tunneling: Volume: (cm) 4.948 Undermining: Starting Pos Ending Posit Maximum Dist Area: -45.8% Volume: -775.8% on: None No Yes ition (o'clock): 4 ion (o'clock): 8 ance: (cm) 0.7 Wound Description Classification: Unstageable/Unclassified Wound Margin: Flat and Intact Exudate Amount: Large Exudate Type: Serosanguineous Exudate Color: red, brown Foul Odor After Cleansing: Yes Due to Product Use: No Slough/Fibrino Yes Wound Bed [place], Mickie C. ([id number]) Granulation Amount: None Present (0%) Exposed Structure Necrotic Amount: Large (67-100%) Fascia Exposed: No Necrotic Quality: Eschar, Adherent Slough Fat Layer (Subcutaneous Tissue) Exposed: Yes Tendon Exposed: No Muscle Exposed: No Joint Exposed: No Bone Exposed: No Periwound Skin Texture Texture Color No Abnormalities Noted: No No Abnormalities Noted: No Callus: No Atrophie Blanche: No Crepitus: No Cyanosis: No Excoriation: Yes Ecchymosis: Yes Induration: No Erythema: Yes Rash: No Erythema Location: Circumferential Scarring: Yes Hemosiderin Staining: No Mottled: No Moisture Pallor: No No Abnormalities Noted: No Rubor: No Dry / Scaly: No Maceration: No Temperature / Pain Temperature: No Abnormality Tenderness on Palpation: Yes Wound Preparation Ulcer Cleansing: Rinsed/Irrigated with Saline Topical Anesthetic Applied: Other: lidocaine 4%, Treatment Notes Wound #5 (Right Gluteus) 1. Cleansed with: Clean wound with Normal Saline 2. Anesthetic Topical Lidocaine 4% cream to wound bed prior to debridement 3. Peri-wound Care: Skin Prep 4. Dressing Applied: Aquacel Ag  5. Secondary Dressing Applied Bordered Foam Dressing Electronic Signature(s) Signed: 11/14/2016 5:51:06 PM By:  Alric Quan Entered By: Alric Quan on 11/14/2016 16:03:38 [place], [name] ([id number]) -------------------------------------------------------------------------------- Wound Assessment Details Patient Name: Byrer, Journey C. Date of Service: 11/14/2016 3:30 PM Medical Record Number: [id number] Patient Account Number: [id number] Date of Birth/Sex: 30-Nov-1950 (66 y.o. Male) Treating RN: [name] Primary Care Naava Janeway: [name] Other Clinician: Referring Arth Nicastro: [name] Treating Joffrey Kerce/Extender: [name] Weeks in Treatment: 42 Wound Status Wound Number: 6 Primary Pressure Ulcer Etiology: Wound Location: Sacrum - Medial Wound Open Wounding Event: Pressure Injury Status: Date Acquired: 10/16/2016 Comorbid Coronary Artery Disease, Type II Weeks Of Treatment: 3 History: Diabetes, Quadriplegia, Seizure Clustered Wound: No Disorder Photos Photo Uploaded By: Alric Quan on 11/14/2016 17:43:22 Wound Measurements Length: (cm) 1 Width: (cm) 3.2 Depth: (cm) 0.1 Area: (cm) 2.513 Volume: (cm) 0.251 % Reduction in Area: 88.1% % Reduction in Volume: 88.2% Epithelialization: Large (67-100%) Tunneling: No Undermining: No Wound Description Classification: Category/Stage II Wound Margin: Indistinct, nonvisible Exudate Amount: Large Exudate Type: Serous Exudate Color: amber Foul Odor After Cleansing: No Slough/Fibrino Yes Wound Bed Granulation Amount: None Present (0%) Exposed Structure Necrotic Amount: Large (67-100%) Fascia Exposed: No Necrotic Quality: Adherent Slough Fat Layer (Subcutaneous Tissue) Exposed: No Tendon Exposed: No Skyles, Triston C. ([id number]) Muscle Exposed: No Joint Exposed: No Bone Exposed: No Limited to Skin Breakdown Periwound Skin Texture Texture Color No Abnormalities Noted: No No Abnormalities Noted: No Callus: No Atrophie Blanche: No Crepitus: No Cyanosis: No Excoriation: No Ecchymosis:  Yes Induration: No Erythema: Yes Rash: No Erythema Location: Circumferential Scarring: No Erythema Measurement: Measured 6 cm Moisture Hemosiderin Staining: No No Abnormalities Noted: No Mottled: No Dry / Scaly: No Pallor: No Maceration: No Rubor: No Temperature / Pain Temperature: No Abnormality Tenderness on Palpation: Yes Wound Preparation Ulcer Cleansing: Rinsed/Irrigated with Saline Topical Anesthetic Applied: Other: lidocaine 4%, Treatment Notes Wound #6 (Medial Sacrum) 1. Cleansed with: Clean wound with Normal Saline 2. Anesthetic Topical Lidocaine 4% cream to wound bed prior to debridement 3. Peri-wound Care: Skin Prep 4. Dressing Applied: Aquacel Ag 5. Secondary Dressing Applied Bordered Foam Dressing Electronic Signature(s) Signed: 11/14/2016 5:51:06 PM By: Alric Quan Entered By: Alric Quan on 11/14/2016 16:04:17 [place], [name] ([id number]) -------------------------------------------------------------------------------- [place] Details Patient Name: [name] Coast C. Date of Service: 11/14/2016 3:30 PM Medical Record Number: [id number] Patient Account Number: [id number] Date of Birth/Sex: Apr 19, 1950 (66 y.o. Male) Treating RN: [name] Primary Care Alveta Quintela: [name] Other Clinician: Referring Zeus Marquis: [name] Treating Mattingly Fountaine/Extender: [name] Weeks in Treatment: 89 Vital Signs Time Taken: 15:45 Temperature (F): 97 Height (in): 70 Pulse (bpm): 81 Weight (lbs): 187 Respiratory Rate (breaths/min): 16 Body Mass Index (BMI): 26.8 Blood Pressure (mmHg): 72/46 Reference Range: 80 - 120 mg / dl Notes Wife states this BP is normal for pt. MD was notified of BP as well. Electronic Signature(s) Signed: 11/14/2016 5:51:06 PM By: Alric Quan Entered By: Alric Quan on 11/14/2016 15:46:36

## 2016-11-17 LAB — CULTURE, BLOOD (SINGLE): Culture: NO GROWTH

## 2016-11-18 DIAGNOSIS — L89323 Pressure ulcer of left buttock, stage 3: Secondary | ICD-10-CM | POA: Diagnosis not present

## 2016-11-18 DIAGNOSIS — G8250 Quadriplegia, unspecified: Secondary | ICD-10-CM | POA: Diagnosis not present

## 2016-11-18 LAB — AEROBIC CULTURE  (SUPERFICIAL SPECIMEN)

## 2016-11-18 LAB — AEROBIC CULTURE W GRAM STAIN (SUPERFICIAL SPECIMEN)

## 2016-11-19 DIAGNOSIS — L89130 Pressure ulcer of right lower back, unstageable: Secondary | ICD-10-CM | POA: Diagnosis not present

## 2016-11-20 ENCOUNTER — Encounter: Payer: Medicare Other | Admitting: Internal Medicine

## 2016-11-20 DIAGNOSIS — G8252 Quadriplegia, C1-C4 incomplete: Secondary | ICD-10-CM | POA: Diagnosis not present

## 2016-11-20 DIAGNOSIS — S51011A Laceration without foreign body of right elbow, initial encounter: Secondary | ICD-10-CM | POA: Diagnosis not present

## 2016-11-20 DIAGNOSIS — S14103S Unspecified injury at C3 level of cervical spinal cord, sequela: Secondary | ICD-10-CM | POA: Diagnosis not present

## 2016-11-20 DIAGNOSIS — I959 Hypotension, unspecified: Secondary | ICD-10-CM | POA: Diagnosis not present

## 2016-11-20 DIAGNOSIS — K219 Gastro-esophageal reflux disease without esophagitis: Secondary | ICD-10-CM | POA: Diagnosis not present

## 2016-11-20 DIAGNOSIS — L89310 Pressure ulcer of right buttock, unstageable: Secondary | ICD-10-CM | POA: Diagnosis not present

## 2016-11-20 DIAGNOSIS — L89322 Pressure ulcer of left buttock, stage 2: Secondary | ICD-10-CM | POA: Diagnosis not present

## 2016-11-20 DIAGNOSIS — L89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, stage 3: Secondary | ICD-10-CM | POA: Diagnosis not present

## 2016-11-20 DIAGNOSIS — E119 Type 2 diabetes mellitus without complications: Secondary | ICD-10-CM | POA: Diagnosis not present

## 2016-11-20 DIAGNOSIS — Z7984 Long term (current) use of oral hypoglycemic drugs: Secondary | ICD-10-CM | POA: Diagnosis not present

## 2016-11-20 DIAGNOSIS — R569 Unspecified convulsions: Secondary | ICD-10-CM | POA: Diagnosis not present

## 2016-11-20 DIAGNOSIS — L03317 Cellulitis of buttock: Secondary | ICD-10-CM | POA: Diagnosis not present

## 2016-11-20 DIAGNOSIS — Z8673 Personal history of transient ischemic attack (TIA), and cerebral infarction without residual deficits: Secondary | ICD-10-CM | POA: Diagnosis not present

## 2016-11-21 NOTE — Progress Notes (Signed)
[name], [name] ([id number]) Visit Report for 11/20/2016 Arrival Information Details Patient Name: [name], [name]. Date of Service: 11/20/2016 3:30 PM Medical Record Number: [id number] Patient Account Number: [id number] Date of Birth/Sex: 1950/03/05 (66 y.o. Male) Treating RN: Cornell Barman Primary Care Yamilet Mcfayden: Otilio Miu Other Clinician: Referring Shandra Szymborski: Otilio Miu Treating Jeweldean Drohan/Extender: Tito Dine in Treatment: 68 Visit Information History Since Last Visit Added or deleted any medications: No Patient Arrived: Wheel Chair Any new allergies or adverse reactions: No Arrival Time: 15:32 Had a fall or experienced change in No activities of daily living that may affect Accompanied By: wife risk of falls: Transfer Assistance: Manual Signs or symptoms of abuse/neglect since last No Patient Identification Verified: Yes visito Secondary Verification Process Yes Hospitalized since last visit: No Completed: Pain Present Now: No Patient Requires Transmission-Based No Precautions: Patient Has Alerts: Yes Electronic Signature(s) Signed: 11/20/2016 4:48:33 PM By: Gretta Cool, BSN, RN, CWS, Kim RN, BSN Entered By: Gretta Cool, BSN, RN, CWS, Kim on 11/20/2016 15:33:32 Myhand, [name] ([id number]) -------------------------------------------------------------------------------- Encounter Discharge Information Details Patient Name: [name], [name]. Date of Service: 11/20/2016 3:30 PM Medical Record Number: [id number] Patient Account Number: [id number] Date of Birth/Sex: Sep 01, 1950 (66 y.o. Male) Treating RN: Cornell Barman Primary Care Delford Wingert: Otilio Miu Other Clinician: Referring Kiano Terrien: Otilio Miu Treating Kenshin Splawn/Extender: Tito Dine in Treatment: 69 Encounter Discharge Information Items Discharge Pain Level: 2 Discharge Condition: Stable Ambulatory Status: Wheelchair Discharge Destination: Home Transportation: Private Auto Accompanied By:  self Schedule Follow-up Appointment: Yes Medication Reconciliation completed Yes and provided to Patient/Care Drevin Ortner: Patient Clinical Summary of Care: Declined Electronic Signature(s) Signed: 11/20/2016 4:43:12 PM By: Gretta Cool, BSN, RN, CWS, Kim RN, BSN Previous Signature: 11/20/2016 4:21:28 PM Version By: Ruthine Dose Entered By: Gretta Cool BSN, RN, CWS, Kim on 11/20/2016 16:43:12 Cubit, [name] ([id number]) -------------------------------------------------------------------------------- Multi Wound Chart Details Patient Name: [name], [name]. Date of Service: 11/20/2016 3:30 PM Medical Record Number: [id number] Patient Account Number: [id number] Date of Birth/Sex: 1950-09-15 (66 y.o. Male) Treating RN: Cornell Barman Primary Care Caelan Atchley: Otilio Miu Other Clinician: Referring Yoel Kaufhold: Otilio Miu Treating Tillman Kazmierski/Extender: Ricard Dillon Weeks in Treatment: 16 Vital Signs Height(in): 70 Pulse(bpm): 86 Weight(lbs): 187 Blood Pressure 92/51 (mmHg): Body Mass Index(BMI): 27 Temperature(F): Respiratory Rate 16 (breaths/min): Photos: [1:No Photos] [4:No Photos] [5:No Photos] Wound Location: [1:Right, Proximal Back] [4:Left Gluteus] [5:Right Gluteus] Wounding Event: [1:Pressure Injury] [4:Pressure Injury] [5:Pressure Injury] Primary Etiology: [1:Pressure Ulcer] [4:Pressure Ulcer] [5:Pressure Ulcer] Comorbid History: [1:N/A] [4:N/A] [5:N/A] Date Acquired: [1:11/24/2015] [4:07/09/2016] [5:09/10/2016] Weeks of Treatment: [1:43] [4:14] [5:9] Wound Status: [1:Open] [4:Open] [5:Open] Measurements L x W x D 1x1.2x0.1 [4:2x2x0.1] [5:4x3.5x0.4] (cm) Area (cm) : [1:0.942] [4:3.142] [5:10.996] Volume (cm) : [1:0.094] [4:0.314] [5:4.398] % Reduction in Area: [1:88.30%] [4:-1901.30%] [5:-94.40%] % Reduction in Volume: 88.30% [4:-1862.50%] [5:-678.40%] Classification: [1:Category/Stage II] [4:Category/Stage II] [5:Unstageable/Unclassified] Exudate Amount: [1:N/A] [4:N/A]  [5:N/A] Wound Margin: [1:N/A] [4:N/A] [5:N/A] Granulation Amount: [1:N/A] [4:N/A] [5:N/A] Necrotic Amount: [1:N/A] [4:N/A] [5:N/A] Necrotic Tissue: [1:N/A] [4:N/A] [5:N/A] Epithelialization: [1:N/A] [4:N/A] [5:N/A] Debridement: [1:N/A] [4:N/A] [5:Debridement ([id number])] Pre-procedure [1:N/A] [4:N/A] [5:16:00] Verification/Time Out Taken: Pain Control: [1:N/A] [4:N/A] [5:Lidocaine 4% Topical Solution] Tissue Debrided: [1:N/A] [4:N/A] Necrotic/Eschar, Fibrin/Slough, Subcutaneous Level: N/A N/A Skin/Subcutaneous Tissue Debridement Area (sq N/A N/A 14.7 cm): Instrument: N/A N/A Blade, Forceps Bleeding: N/A N/A Moderate Hemostasis Achieved: N/A N/A Silver Nitrate Procedural Pain: N/A N/A 0 Post Procedural Pain: N/A N/A 0 Debridement Treatment N/A N/A Procedure was tolerated Response: well Post Debridement N/A N/A 4.2x3.5x0.6 Measurements L x W x D (cm) Post Debridement  N/A N/A 6.927 Volume: (cm) Post Debridement N/A N/A Unstageable/Unclassified Stage: Periwound Skin Texture: No Abnormalities Noted No Abnormalities Noted No Abnormalities Noted Periwound Skin No Abnormalities Noted No Abnormalities Noted No Abnormalities Noted Moisture: Periwound Skin Color: No Abnormalities Noted No Abnormalities Noted No Abnormalities Noted Tenderness on No No No Palpation: Procedures Performed: N/A N/A N/A Wound Number: 6 7 N/A Photos: No Photos No Photos N/A Wound Location: Medial Sacrum Right Trochanter N/A Wounding Event: Pressure Injury Pressure Injury N/A Primary Etiology: Pressure Ulcer Pressure Ulcer N/A Comorbid History: N/A Coronary Artery Disease, N/A Type II Diabetes, Quadriplegia, Seizure Disorder Date Acquired: 10/16/2016 11/06/2016 N/A Weeks of Treatment: 4 0 N/A Wound Status: Open Open N/A Measurements L x W x D 4x7x0.1 1x1.2x0.1 N/A (cm) Area (cm) : 21.991 0.942 N/A Volume (cm) : 2.199 0.094 N/A % Reduction in Area: -3.70% N/A N/A % Reduction in Volume:  -3.70% N/A N/A Classification: Category/Stage II Unstageable/Unclassified N/A Exudate Amount: N/A None Present N/A [name], [name]. ([id number]) Wound Margin: N/A Flat and Intact N/A Granulation Amount: N/A None Present (0%) N/A Necrotic Amount: N/A Large (67-100%) N/A Necrotic Tissue: N/A Eschar N/A Epithelialization: N/A Large (67-100%) N/A Debridement: Debridement ([id number]- N/A N/A 11047) Pre-procedure 16:00 N/A N/A Verification/Time Out Taken: Pain Control: Lidocaine 4% Topical N/A N/A Solution Tissue Debrided: Necrotic/Eschar, N/A N/A Fibrin/Slough, Subcutaneous Level: Skin/Subcutaneous N/A N/A Tissue Debridement Area (sq 28 N/A N/A cm): Instrument: Blade, Forceps N/A N/A Bleeding: Moderate N/A N/A Hemostasis Achieved: Silver Nitrate N/A N/A Procedural Pain: 0 N/A N/A Post Procedural Pain: 0 N/A N/A Debridement Treatment Procedure was tolerated N/A N/A Response: well Post Debridement 7x7x0.5 N/A N/A Measurements L x W x D (cm) Post Debridement 19.242 N/A N/A Volume: (cm) Post Debridement Category/Stage II N/A N/A Stage: Periwound Skin Texture: No Abnormalities Noted No Abnormalities Noted N/A Periwound Skin No Abnormalities Noted No Abnormalities Noted N/A Moisture: Periwound Skin Color: No Abnormalities Noted No Abnormalities Noted N/A Tenderness on No No N/A Palpation: Procedures Performed: Debridement N/A N/A Treatment Notes Electronic Signature(s) Signed: 11/20/2016 4:41:38 PM By: Gretta Cool, BSN, RN, CWS, Kim RN, BSN Entered By: Gretta Cool, BSN, RN, CWS, Kim on 11/20/2016 16:41:37 [place], [name] ([id number]) -------------------------------------------------------------------------------- Multi-Disciplinary Care Plan Details Patient Name: [name], [name]. Date of Service: 11/20/2016 3:30 PM Medical Record Number: [id number] Patient Account Number: [id number] Date of Birth/Sex: 1950-09-17 (67 y.o. Male) Treating RN: Cornell Barman Primary Care Gizell Danser: Otilio Miu Other Clinician: Referring Shawntavia Saunders: Otilio Miu Treating Mike Berntsen/Extender: Ricard Dillon Weeks in Treatment: 27 Active Inactive ` Nutrition Nursing Diagnoses: Imbalanced nutrition Impaired glucose control: actual or potential Goals: Patient/caregiver agrees to and verbalizes understanding of need to use nutritional supplements and/or vitamins as prescribed Date Initiated: 01/24/2016 Target Resolution Date: 03/27/2016 Goal Status: Active Patient/caregiver will maintain therapeutic glucose control Date Initiated: 01/24/2016 Target Resolution Date: 03/27/2016 Goal Status: Active Interventions: Assess patient nutrition upon admission and as needed per policy Provide education on elevated blood sugars and impact on wound healing Notes: ` Orientation to the Wound Care Program Nursing Diagnoses: Knowledge deficit related to the wound healing center program Goals: Patient/caregiver will verbalize understanding of the [place] Date Initiated: 01/24/2016 Target Resolution Date: 03/27/2016 Goal Status: Active Interventions: Provide education on orientation to the wound center Notes: [name], [name] ([id number]) ` Pain, Acute or Chronic Nursing Diagnoses: Pain, acute or chronic: actual or potential Potential alteration in comfort, pain Goals: Patient will verbalize adequate pain control and receive pain control interventions during procedures as needed Date Initiated: 01/24/2016  Target Resolution Date: 03/27/2016 Goal Status: Active Patient/caregiver will verbalize adequate pain control between visits Date Initiated: 01/24/2016 Target Resolution Date: 03/27/2016 Goal Status: Active Patient/caregiver will verbalize comfort level met Date Initiated: 01/24/2016 Target Resolution Date: 03/27/2016 Goal Status: Active Interventions: Assess comfort goal upon admission Complete pain assessment as per visit requirements Notes: ` Pressure Nursing  Diagnoses: Knowledge deficit related to management of pressures ulcers Potential for impaired tissue integrity related to pressure, friction, moisture, and shear Goals: Patient will remain free from development of additional pressure ulcers Date Initiated: 01/24/2016 Target Resolution Date: 03/27/2016 Goal Status: Active Interventions: Assess: immobility, friction, shearing, incontinence upon admission and as needed Assess offloading mechanisms upon admission and as needed Assess potential for pressure ulcer upon admission and as needed Provide education on pressure ulcers Notes: [name], [name] ([id number]) Wound/Skin Impairment Nursing Diagnoses: Impaired tissue integrity Goals: Ulcer/skin breakdown will have a volume reduction of 30% by week 4 Date Initiated: 01/24/2016 Target Resolution Date: 03/27/2016 Goal Status: Active Ulcer/skin breakdown will have a volume reduction of 50% by week 8 Date Initiated: 01/24/2016 Target Resolution Date: 03/27/2016 Goal Status: Active Ulcer/skin breakdown will have a volume reduction of 80% by week 12 Date Initiated: 01/24/2016 Target Resolution Date: 03/27/2016 Goal Status: Active Interventions: Assess patient/caregiver ability to perform ulcer/skin care regimen upon admission and as needed Assess ulceration(s) every visit Notes: Electronic Signature(s) Signed: 11/20/2016 4:41:29 PM By: Gretta Cool, BSN, RN, CWS, Kim RN, BSN Entered By: Gretta Cool, BSN, RN, CWS, Kim on 11/20/2016 16:41:27 [name], [name] ([id number]) -------------------------------------------------------------------------------- Pain Assessment Details Patient Name: [name] [place] [name]. Date of Service: 11/20/2016 3:30 PM Medical Record Number: [id number] Patient Account Number: [id number] Date of Birth/Sex: 1951-02-25 (66 y.o. Male) Treating RN: Cornell Barman Primary Care Juanantonio Stolar: Otilio Miu Other Clinician: Referring Joniece Smotherman: Otilio Miu Treating Mycal Conde/Extender:  Tito Dine in Treatment: 21 Active Problems Location of Pain Severity and Description of Pain Patient Has Paino Yes Site Locations Pain Location: Pain in Ulcers With Dressing Change: Yes Pain Management and Medication Current Pain Management: Electronic Signature(s) Signed: 11/20/2016 4:48:33 PM By: Gretta Cool, BSN, RN, CWS, Kim RN, BSN Entered By: Gretta Cool, BSN, RN, CWS, Kim on 11/20/2016 15:33:52 Shaw, [name] ([id number]) -------------------------------------------------------------------------------- Patient/Caregiver Education Details Patient Name: [name]. Date of Service: 11/20/2016 3:30 PM Medical Record Number: [id number] Patient Account Number: [id number] Date of Birth/Gender: [birth year] (66 y.o. Male) Treating RN: Cornell Barman Primary Care Physician: Otilio Miu Other Clinician: Referring Physician: Otilio Miu Treating Physician/Extender: Tito Dine in Treatment: 72 Education Assessment Education Provided To: Patient Education Topics Provided Pressure: Handouts: Pressure Ulcers: Care and Offloading, Other: keep all pressure and shearing off of areas Methods: Demonstration Responses: State content correctly Electronic Signature(s) Signed: 11/20/2016 4:48:33 PM By: Gretta Cool, BSN, RN, CWS, Kim RN, BSN Entered By: Gretta Cool, BSN, RN, CWS, Kim on 11/20/2016 16:44:17 [place]ce, [name] ([id number]) -------------------------------------------------------------------------------- Wound Assessment Details Patient Name: [name], [name]. Date of Service: 11/20/2016 3:30 PM Medical Record Number: [id number] Patient Account Number: [id number] Date of Birth/Sex: Aug 21, 1950 (66 y.o. Male) Treating RN: Cornell Barman Primary Care Hampton Cost: Otilio Miu Other Clinician: Referring Shaketa Serafin: Otilio Miu Treating Yuritzy Zehring/Extender: Ricard Dillon Weeks in Treatment: 53 Wound Status Wound Number: 1 Primary Etiology: Pressure Ulcer Wound Location: Right,  Proximal Back Wound Status: Open Wounding Event: Pressure Injury Date Acquired: 11/24/2015 Weeks Of Treatment: 43 Clustered Wound: No Photos Photo Uploaded By: Gretta Cool, BSN, RN, CWS, Kim on 11/20/2016 16:44:56 Wound Measurements Length: (cm) 1 Width: (cm) 1.2 Depth: (cm) 0.1 Area: (  cm) 0.942 Volume: (cm) 0.094 % Reduction in Area: 88.3% % Reduction in Volume: 88.3% Wound Description Classification: Category/Stage II Periwound Skin Texture Texture Color No Abnormalities Noted: No No Abnormalities Noted: No Moisture No Abnormalities Noted: No Treatment Notes Wound #1 (Right, Proximal Back) 1. Cleansed with: Clean wound with Normal Saline 2. Anesthetic [place], Kavontae [name]. ([id number]) Topical Lidocaine 4% cream to wound bed prior to debridement 3. Peri-wound Care: Skin Prep 4. Dressing Applied: Aquacel Ag 5. Secondary Dressing Applied Bordered Foam Dressing Electronic Signature(s) Signed: 11/20/2016 4:48:33 PM By: Elliot Gurney, BSN, RN, CWS, Kim RN, BSN Entered By: Elliot Gurney, BSN, RN, CWS, Kim on 11/20/2016 15:42:34 Dimitri, [name] ([id number]) -------------------------------------------------------------------------------- Wound Assessment Details Patient Name: [name], [name]. Date of Service: 11/20/2016 3:30 PM Medical Record Number: [id number] Patient Account Number: [id number] Date of Birth/Sex: [birth year] (66 y.o. Male) Treating RN: Huel Coventry Primary Care Gerhardt Gleed: Elizabeth Sauer Other Clinician: Referring Doyce Saling: Elizabeth Sauer Treating Makya Yurko/Extender: Maxwell Caul Weeks in Treatment: 51 Wound Status Wound Number: 4 Primary Etiology: Pressure Ulcer Wound Location: Left Gluteus Wound Status: Open Wounding Event: Pressure Injury Date Acquired: 07/09/2016 Weeks Of Treatment: 14 Clustered Wound: No Photos Photo Uploaded By: Elliot Gurney, BSN, RN, CWS, Kim on 11/20/2016 16:44:56 Wound Measurements Length: (cm) 2 Width: (cm) 2 Depth: (cm) 0.1 Area: (cm)  3.142 Volume: (cm) 0.314 % Reduction in Area: -1901.3% % Reduction in Volume: -1862.5% Wound Description Classification: Category/Stage II Periwound Skin Texture Texture Color No Abnormalities Noted: No No Abnormalities Noted: No Moisture No Abnormalities Noted: No Treatment Notes Wound #4 (Left Gluteus) 1. Cleansed with: Clean wound with Normal Saline 2. Anesthetic Porzio, Leshaun [name]. ([id number]) Topical Lidocaine 4% cream to wound bed prior to debridement 3. Peri-wound Care: Skin Prep 4. Dressing Applied: Aquacel Ag 5. Secondary Dressing Applied Bordered Foam Dressing Electronic Signature(s) Signed: 11/20/2016 4:48:33 PM By: Elliot Gurney, BSN, RN, CWS, Kim RN, BSN Entered By: Elliot Gurney, BSN, RN, CWS, Kim on 11/20/2016 15:42:34 [place], [name] ([id number]) -------------------------------------------------------------------------------- Wound Assessment Details Patient Name: [name], [name]. Date of Service: 11/20/2016 3:30 PM Medical Record Number: [id number] Patient Account Number: [id number] Date of Birth/Sex: 08-09-1950 (66 y.o. Male) Treating RN: Huel Coventry Primary Care Caliegh Middlekauff: Elizabeth Sauer Other Clinician: Referring Rhodie Cienfuegos: Elizabeth Sauer Treating Amarius Toto/Extender: Maxwell Caul Weeks in Treatment: 60 Wound Status Wound Number: 5 Primary Etiology: Pressure Ulcer Wound Location: Right Gluteus Wound Status: Open Wounding Event: Pressure Injury Date Acquired: 09/10/2016 Weeks Of Treatment: 9 Clustered Wound: No Photos Photo Uploaded By: Elliot Gurney, BSN, RN, CWS, Kim on 11/20/2016 16:45:27 Wound Measurements Length: (cm) 4 Width: (cm) 3.5 Depth: (cm) 0.4 Area: (cm) 10.996 Volume: (cm) 4.398 % Reduction in Area: -94.4% % Reduction in Volume: -678.4% Wound Description Classification: Unstageable/Unclassified Periwound Skin Texture Texture Color No Abnormalities Noted: No No Abnormalities Noted: No Moisture No Abnormalities Noted: No Treatment Notes Wound  #5 (Right Gluteus) 1. Cleansed with: Clean wound with Normal Saline 2. Anesthetic Muscarella, Diquan [name]. ([id number]) Topical Lidocaine 4% cream to wound bed prior to debridement 3. Peri-wound Care: Skin Prep 4. Dressing Applied: Aquacel Ag 5. Secondary Dressing Applied Bordered Foam Dressing Electronic Signature(s) Signed: 11/20/2016 4:48:33 PM By: Elliot Gurney, BSN, RN, CWS, Kim RN, BSN Entered By: Elliot Gurney, BSN, RN, CWS, Kim on 11/20/2016 15:42:34 Bianchini, [name] ([id number]) -------------------------------------------------------------------------------- Wound Assessment Details Patient Name: KRISTOPH, SATTLER [name]. Date of Service: 11/20/2016 3:30 PM Medical Record Number: [id number] Patient Account Number: [id number] Date of Birth/Sex: Aug 13, 1950 (66 y.o. Male) Treating RN: Huel Coventry Primary Care Brinda Focht: Elizabeth Sauer Other Clinician: Referring Abygail Galeno:  Otilio Miu Treating Dudley Cooley/Extender: Ricard Dillon Weeks in Treatment: 51 Wound Status Wound Number: 6 Primary Etiology: Pressure Ulcer Wound Location: Medial Sacrum Wound Status: Open Wounding Event: Pressure Injury Date Acquired: 10/16/2016 Weeks Of Treatment: 4 Clustered Wound: No Photos Photo Uploaded By: Gretta Cool, BSN, RN, CWS, Kim on 11/20/2016 16:45:27 Wound Measurements Length: (cm) 4 Width: (cm) 7 Depth: (cm) 0.1 Area: (cm) 21.991 Volume: (cm) 2.199 % Reduction in Area: -3.7% % Reduction in Volume: -3.7% Wound Description Classification: Category/Stage II Periwound Skin Texture Texture Color No Abnormalities Noted: No No Abnormalities Noted: No Moisture No Abnormalities Noted: No Treatment Notes Wound #6 (Medial Sacrum) 1. Cleansed with: Clean wound with Normal Saline 2. Anesthetic Flax, Kalob [name]. ([id number]) Topical Lidocaine 4% cream to wound bed prior to debridement 3. Peri-wound Care: Skin Prep 4. Dressing Applied: Aquacel Ag 5. Secondary Dressing Applied Bordered Foam Dressing Electronic  Signature(s) Signed: 11/20/2016 4:48:33 PM By: Gretta Cool, BSN, RN, CWS, Kim RN, BSN Entered By: Gretta Cool, BSN, RN, CWS, Kim on 11/20/2016 15:42:35 [place], [name] ([id number]) -------------------------------------------------------------------------------- Wound Assessment Details Patient Name: KALVYN, DESA [name]. Date of Service: 11/20/2016 3:30 PM Medical Record Number: [id number] Patient Account Number: [id number] Date of Birth/Sex: [birth year] (66 y.o. Male) Treating RN: Cornell Barman Primary Care Krimson Massmann: Otilio Miu Other Clinician: Referring Lilyan Prete: Otilio Miu Treating Mekiyah Gladwell/Extender: Ricard Dillon Weeks in Treatment: 58 Wound Status Wound Number: 7 Primary Pressure Ulcer Etiology: Wound Location: Right Trochanter Wound Open Wounding Event: Pressure Injury Status: Date Acquired: 11/06/2016 Comorbid Coronary Artery Disease, Type II Weeks Of Treatment: 0 History: Diabetes, Quadriplegia, Seizure Clustered Wound: No Disorder Photos Photo Uploaded By: Gretta Cool, BSN, RN, CWS, Kim on 11/20/2016 16:45:38 Wound Measurements Length: (cm) 1 Width: (cm) 1.2 Depth: (cm) 0.1 Area: (cm) 0.942 Volume: (cm) 0.094 % Reduction in Area: % Reduction in Volume: Epithelialization: Large (67-100%) Tunneling: No Undermining: No Wound Description Classification: Unstageable/Unclassified Wound Margin: Flat and Intact Exudate Amount: None Present Foul Odor After Cleansing: No Slough/Fibrino No Wound Bed Granulation Amount: None Present (0%) Exposed Structure Necrotic Amount: Large (67-100%) Fascia Exposed: No Necrotic Quality: Eschar Fat Layer (Subcutaneous Tissue) Exposed: No Tendon Exposed: No Muscle Exposed: No Joint Exposed: No Bone Exposed: No [name], Hilberto [name]. ([id number]) Periwound Skin Texture Texture Color No Abnormalities Noted: No No Abnormalities Noted: No Moisture No Abnormalities Noted: No Treatment Notes Wound #7 (Right Trochanter) 1. Cleansed with: Clean  wound with Normal Saline 2. Anesthetic Topical Lidocaine 4% cream to wound bed prior to debridement 3. Peri-wound Care: Skin Prep 4. Dressing Applied: Aquacel Ag 5. Secondary Dressing Applied Bordered Foam Dressing Electronic Signature(s) Signed: 11/20/2016 4:48:33 PM By: Gretta Cool, BSN, RN, CWS, Kim RN, BSN Entered By: Gretta Cool, BSN, RN, CWS, Kim on 11/20/2016 15:45:26 Lathon, [name] ([id number]) -------------------------------------------------------------------------------- Vitals Details Patient Name: ADE, STMARIE. Date of Service: 11/20/2016 3:30 PM Medical Record Number: [id number] Patient Account Number: [id number] Date of Birth/Sex: 1950-04-23 (66 y.o. Male) Treating RN: Cornell Barman Primary Care Caspar Favila: Otilio Miu Other Clinician: Referring Shakeeta Godette: Otilio Miu Treating Kyo Cocuzza/Extender: Ricard Dillon Weeks in Treatment: 91 Vital Signs Time Taken: 15:33 Pulse (bpm): 86 Height (in): 70 Respiratory Rate (breaths/min): 16 Weight (lbs): 187 Blood Pressure (mmHg): 92/51 Body Mass Index (BMI): 26.8 Reference Range: 80 - 120 mg / dl Electronic Signature(s) Signed: 11/20/2016 4:48:33 PM By: Gretta Cool, BSN, RN, CWS, Kim RN, BSN Entered By: Gretta Cool, BSN, RN, CWS, Kim on 11/20/2016 15:34:27

## 2016-11-22 NOTE — Progress Notes (Signed)
BART, ASHFORD ([id number]) Visit Report for 11/20/2016 Debridement Details Patient Name: [name], [name]. Date of Service: 11/20/2016 3:30 PM Medical Record Number: [id number] Patient Account Number: [id number] Date of Birth/Sex: 1950-08-10 (66 y.o. Male) Treating RN: Huel Coventry Primary Care Provider: Elizabeth Sauer Other Clinician: Referring Provider: Elizabeth Sauer Treating Provider/Extender: Altamese [place] in Treatment: 67 Debridement Performed for Wound #5 Right Gluteus Assessment: Performed By: Physician Maxwell Caul, MD Debridement: Debridement Pre-procedure Verification/Time Out Yes - 16:00 Taken: Start Time: 16:00 Pain Control: Lidocaine 4% Topical Solution Level: Skin/Subcutaneous Tissue Total Area Debrided (L x 4.2 (cm) x 3.5 (cm) = 14.7 (cm) W): Tissue and other Viable, Non-Viable, Eschar, Fibrin/Slough, Subcutaneous material debrided: Instrument: Blade, Forceps Bleeding: Moderate Hemostasis Achieved: Silver Nitrate End Time: 16:05 Procedural Pain: 0 Post Procedural Pain: 0 Response to Treatment: Procedure was tolerated well Post Debridement Measurements of Total Wound Length: (cm) 4.2 Stage: Unstageable/Unclassified Width: (cm) 3.5 Depth: (cm) 0.6 Volume: (cm) 6.927 Character of Wound/Ulcer Post Stable Debridement: Post Procedure Diagnosis Same as Pre-procedure Electronic Signature(s) Signed: 11/20/2016 4:47:26 PM By: Elliot Gurney, BSN, RN, CWS, Kim RN, BSN Signed: 11/21/2016 8:10:28 AM By: Baltazar Najjar MD [name], [name] ([id number]) Entered By: Elliot Gurney, BSN, RN, CWS, Kim on 11/20/2016 16:47:26 [name], [name] ([id number]) -------------------------------------------------------------------------------- HPI Details Patient Name: [name], [name] Date of Service: 11/20/2016 3:30 PM Medical Record Number: [id number] Patient Account Number: [id number] Date of Birth/Sex: 1950/06/15 (66 y.o. 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 He has lower extremity sensation and can move his legs but has no/limited control over his arms. His wife accompanies him today and states that in the late spring or early summer of 2017 the patient became very depressed. He refused the refused to mobilize and he developed several pressure ulcers on his back. Most of these have healed however they have a recalcitrant area over the right scapula. They've been using Santyl on this for at least the last month. In terms of depression the patient is doing better now on an antidepressant. He saw his primary physician on 01/12/16 at which time there was apparently green drainage coming out of this area [Dr. Deana Jones]. Dr. Yetta Barre works in the [facility]. He has completed this Septra. Otherwise looking through [facility] notes that he has a history of seizures. He also had a stroke in 2008 he follows with neurology. He also has type 2 diabetes on Glucophage, hyperlipidemia and gastroesophageal reflux. He takes Plavix for stroke prevention and Keppra for seizure prophylaxis. 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 02/08/16; this is a patient with a pressure ulcer over the right scapula. His wife has been doing the dressing with Santyl and border foam change every second day. When he arrived here 2 weeks ago we did a fairly extensive mechanical debridement. We are asked medical modalities to go out to the home and see what they might be eligible for in terms of pressure-relief surfaces [level 2] but the wife states that they  have not heard from them. 03/20/15; this is a patient we haven't seen in 5-6 weeks. This was largely due  to transportation issues. They've been using Santyl and border foam changing every second day for a pressure injury over the right scapula. The patient has a C3-C4 spinal injury. We had also asked for a review by the people who supplied DME to look at his wheelchair cushion, mattress etc. I don't know that this ever happened. In the meantime the wound has done remarkably well current measurements 1.8 x 2 x 0.1 04/03/16; the patient's dimensions have gone up to 3 cm in diameter quite a deterioration from last time. He is also complaining of pain in this area which is new. 04/10/16; 3 x 1.5 x 0.1. No difference from last week. Culture I did last week was negative he has completed antibiotics. 04/24/16 2.4 x 2 x 0.1; wound generally looks smaller. Middle area that I had to debrided last time looks healthier. His son is fashioned a large piece of foam cut out where the patient's wound with hit the back of his wheelchair 05/01/16; wound is a same size however the surface of this looks better. The middle innkeeper area required a repeat debridement 05/15/16; wound is down and dimensions granulation still looks healthy. The medial aspect of this wound is now the deeper Divot. We'll see how this responds to further healing. We're using Hydrofera Blue 06/05/16; Wound 1.7x1.1x0.1 still using hyudrofera blue 06/26/16; the patient arrives back in clinic after a three-week hiatus. He was hospitalized from 06/09/16 through 06/10/16. He was found to be confused. CT scan of the head showed nothing really acute. His sodium was [name], [name] ([id number]) low at 131. He was rehydrated. He was felt to have a UTI and given antibiotics and antibiotics at discharge although his final culture result only showed multiple organisms. His wife says today that at the time of the hospitalization they discovered a large intact  blister over the large aspect of his left calcaneus. This is recently ruptured. The wound on his right scapula is somewhat larger. More his wife is concerned about his current status. She states that he is still confused sleeps for long periods. He is not having fever chills cough or diarrhea [1 loose bowel movement per day]. He continues to have a suprapubic catheter. She notes that he is not eating and drinking well. Patient states he just does not want to eat. He does not feel nauseated or vomit. 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 Admission sodium was 131 at discharge 139 BUN 22 and 1.22 at admission, 17 and 1 at discharge. 07/03/16; patient's mental status is back to normal. He has a new wound on the right elbow caused by traumatizing his elbow against the wall apparently at the dermatologist office last week. We continue with the original wound on the right scapula and the wound from 2 weeks ago on his left heel. We have been using silver alginate to the area on the heel. 08/14/16; patient has not been here in almost 6 weeks. When he was here last time he had the pressure ulcer on the right scapula, atraumatic wound on the right lateral elbow and an area on his left heel. His wife states that at one point all of these were healed and she didn't really feel he needed to come back here. Since then the patient has developed a reopening of the area on the right scapula, a stage II wound on the left buttock and a reopening of the  area on the right lateral elbow. As usual his wife as a litany of complaints against home health, she is dismissing or is going to dismiss well care. She tells [place] she has a long list of supplies at home already for some reason they were not felt to be eligible for a group 2 or 3 surface although they have a hospital bed at home but no offloading surface 08/28/16; the patient arrived  today unfortunately incontinent of stool in spite of this the wounds all appear to be better including the right scapular area, his left buttock's left heel and the right lateral elbow. 09/18/16; this is a patient who arrived today for follow-up of a pressure area over his right scapula area, left buttock and there was an area on his right lateral elbow. He arrived in clinic today with a worrisome area over the right initial tuberosity/right buttock. This had a necrotic surface with draining purulence. He has not been systemically unwell. The area over the left elbow healed. He arrived in clinic today with dressing that hadn't been changed over the scapula for 2 or 3 days per her intake. His wife is previously fired home health 09/25/16;; the last time the patient was here he arrived with a new grossly infected wound over the right ischial tuberosity. Culture I did not show a specific pathogen. I did think this required admission to hospital and he was admitted from 09/18/16 through 09/20/16. Initially given vanc and Zosyn but then discharged with 5 days' worth of Augmentin. An MRI was done that did not show osteomyelitis of the right ischial tuberosity however it did show cellulitis and possible myositis. He also has wounds on the left ischial tuberosity and the area over the right scapular area. 10/04/16 on evaluation today patient appears to be doing better in regard to his back wound and is stable in regard to the gluteal wounds. There does not appear to be any evidence of significant infection at this point. He is tolerating the dressing changes currently. His wife has been performing the dressing changes. Nonetheless he does have some discomfort especially in the gluteal areas although he did not specifically rate the discomfort there were times during evaluation where he flinched and it was obvious it was bothering him. No fevers, chills, nausea, or vomiting noted at this time. 10/17/16; the  patient has bilateral ischial tuberosity wounds right greater than left. The left seems to be making good progress wears the right has not really changed that much and dimensions. There is also some threatened area on the right side around the wound circumference. Equally worrisome there is a DTI over the lower sacral area however this is not open as of yet. His area over the left scapula which is the chronic wound he was coming to the clinic continues to make excellent progress FOCH, ROSENWALD ([id number]) 8/28 The patient comes in today wanting to talk about getting back in his wheelchair. He is very bored lying in bed at home 11/14/16; three-week hiatus for this patient for medical illnesses whether etc. He was in the emergency room at  2 days ago when his wife noted odor coming out of the right initial tuberosity wound and discoloration. I reviewed his ER presentation from 11/12/16. White count was 11.7 differential count reasonably normal basic metabolic panel was normal. Serum lactate was 1.9. He has a suprapubic catheter which showed positive urine nitrate large leukocytes and many bacteria. Urine cultures come back showing multiple species. Blood culture was  negative. Wound culture is still pending. He was put on Keflex and Bactrim. The patient had an MRI of this area on 09/18/16 that did not show osteomyelitis however at that time there was possible cellulitis and infectious myositis no drainable fluid collection. After this he wishes admitted the hospital had vancomycin and Zosyn for 5 days and then 5 days of Augmentin 11/20/16; culture of the wound that I did last week showed multiple organisms. This was the same as from the emergency room as it turns out. He is still on Keflex and Bactrim and completing this. I was really expecting something a little more ominous although he seems to have stabilized. We have been using silver alginate to the major wound on the right ischial  tuberosity. He had a DTI over the right greater trochanter, superficial wound over the left ischial tuberosity and a DTI on the superior part of the left hemipelvis. FORTUNATELY everything looks a little better here than last week. At my suggestion his wife looked into [facility] but was told that he needed a 3 day acute hospital stay. This would be similar to what is required for skilled nursing and I was not specifically aware of this although it may have something to do with the patient's specific insurance Electronic Signature(s) Signed: 11/21/2016 8:10:28 AM By: Baltazar Najjar MD Entered By: Baltazar Najjar on 11/21/2016 08:09:23 [name], [name] ([id number]) -------------------------------------------------------------------------------- Physical Exam Details Patient Name: [name], [name] Date of Service: 11/20/2016 3:30 PM Medical Record Number: [id number] Patient Account Number: [id number] Date of Birth/Sex: 1950/11/13 (66 y.o. Male) Treating RN: Huel Coventry Primary Care Provider: Elizabeth Sauer Other Clinician: Referring Provider: Elizabeth Sauer Treating Provider/Extender: Maxwell Caul Weeks in Treatment: 20 Constitutional Patient is hypotensive. He appears well. Pulse regular and within target range for patient.Marland Kitchen Respirations regular, non-labored and within target range.Marland Kitchen appears in no distress. Appears medically stable. Notes Wound exam; the patient looks somewhat better today. oThe worrisome unstageable area over the right ischial tuberosity was again debrided today with pickups and scalpel to remove necrotic muscle and debris. Hemostasis with silver nitrate and pressure oHis DTI over the right greater tuberosity and the left hemipelvis looked better. Stable appearance of the wound over the left ischial tuberosity Electronic Signature(s) Signed: 11/21/2016 8:10:28 AM By: Baltazar Najjar MD Entered By: Baltazar Najjar on 11/21/2016 08:02:36 [name], [name] ([id number]) -------------------------------------------------------------------------------- Physician Orders Details Patient Name: [name], [name] Date of Service: 11/20/2016 3:30 PM Medical Record Number: [id number] Patient Account Number: [id number] Date of Birth/Sex: September 27, 1950 (66 y.o. Male) Treating RN: Huel Coventry Primary Care Provider: Elizabeth Sauer Other Clinician: Referring Provider: Elizabeth Sauer Treating Provider/Extender: Altamese Mulino in Treatment: 11 Verbal / Phone Orders: No Diagnosis Coding ICD-10 Coding Code Description [phone] Pressure ulcer of other site, stage 3 S14.103S Unspecified injury at C3 level of cervical spinal cord, sequela S51.011A Laceration without foreign body of right elbow, initial encounter L89.322 Pressure ulcer of left buttock, stage 2 L89.310 Pressure ulcer of right buttock, unstageable L03.317 Cellulitis of buttock Wound Cleansing Wound #1 Right,Proximal Back o Clean wound with Normal Saline. o Cleanse wound with mild soap and water Wound #4 Left Gluteus o Clean wound with Normal Saline. o Cleanse wound with mild soap and water o Cleanse wound with mild soap and water Wound #5 Right Gluteus o Clean wound with Normal Saline. o Cleanse wound with mild soap and water o Cleanse wound with mild soap and water Wound #6 Medial Sacrum o Clean wound with  Normal Saline. o Cleanse wound with mild soap and water o Cleanse wound with mild soap and water Anesthetic Wound #1 Right,Proximal Back o Topical Lidocaine 4% cream applied to wound bed prior to debridement Wound #4 Left Gluteus o Topical Lidocaine 4% cream applied to wound bed prior to debridement [name], [name] ([id number]) Wound #5 Right Gluteus o Topical Lidocaine 4% cream applied to wound bed prior to debridement Wound #6 Medial Sacrum o Topical Lidocaine 4% cream applied to wound bed prior to debridement Skin Barriers/Peri-Wound  Care Wound #1 Right,Proximal Back o Skin Prep Wound #4 Left Gluteus o Skin Prep o Skin Prep Wound #5 Right Gluteus o Skin Prep o Skin Prep Wound #6 Medial Sacrum o Skin Prep o Skin Prep Primary Wound Dressing Wound #1 Right,Proximal Back o Aquacel Ag Wound #4 Left Gluteus o Aquacel Ag Wound #5 Right Gluteus o Aquacel Ag Wound #6 Medial Sacrum o Aquacel Ag Secondary Dressing Wound #1 Right,Proximal Back o Boardered Foam Dressing Wound #4 Left Gluteus o Boardered Foam Dressing Wound #5 Right Gluteus o Boardered Foam Dressing Wound #6 Medial Sacrum o Boardered Foam Dressing [name], [name] ([id number]) Dressing Change Frequency Wound #1 Right,Proximal Back o Change dressing every other day. Wound #4 Left Gluteus o Change dressing every other day. Wound #5 Right Gluteus o Change dressing every other day. Wound #6 Medial Sacrum o Change dressing every other day. Follow-up Appointments Wound #1 Right,Proximal Back o Return Appointment in 1 week. Wound #4 Left Gluteus o Return Appointment in 1 week. Wound #5 Right Gluteus o Return Appointment in 1 week. Wound #6 Medial Sacrum o Return Appointment in 1 week. Off-Loading Wound #1 Right,Proximal Back o Roho cushion for wheelchair o Turn and reposition every 2 hours Wound #4 Left Gluteus o Roho cushion for wheelchair o Turn and reposition every 2 hours Wound #5 Right Gluteus o Roho cushion for wheelchair o Turn and reposition every 2 hours Wound #6 Medial Sacrum o Roho cushion for wheelchair o Turn and reposition every 2 hours Additional Orders / Instructions Wound #1 Right,Proximal Back o Increase protein intake. o Other: - Apply to [facility] or [facility] C. ([id number]) Wound #4 Left Gluteus o Increase protein intake. o Other: - Apply to [facility] or [facility] Wound #5 Right  Gluteus o Increase protein intake. o Other: - Apply to [facility] or [facility] Wound #6 Medial Sacrum o Increase protein intake. o Other: - Apply to [place] or [facility] Medications-please add to medication list. Wound #1 Right,Proximal Back o Other: - Vitamins A, C and Zinc Wound #4 Left Gluteus o Other: - Vitamins A, C and Zinc Wound #5 Right Gluteus o Other: - Vitamins A, C and Zinc Wound #6 Medial Sacrum o Other: - Vitamins A, C and Zinc Electronic Signature(s) Signed: 11/20/2016 4:48:33 PM By: Elliot Gurney, BSN, RN, CWS, Kim RN, BSN Signed: 11/21/2016 8:10:28 AM By: Baltazar Najjar MD Entered By: Elliot Gurney, BSN, RN, CWS, Kim on 11/20/2016 16:19:33 [name], [name] ([id number]) -------------------------------------------------------------------------------- Problem List Details Patient Name: SHIVANK, PINEDO C. Date of Service: 11/20/2016 3:30 PM Medical Record Number: [id number] Patient Account Number: [id number] Date of Birth/Sex: [birth year] (66 y.o. Male) Treating RN: Huel Coventry Primary Care Provider: Elizabeth Sauer Other Clinician: Referring Provider: Elizabeth Sauer Treating Provider/Extender: Maxwell Caul Weeks in Treatment: 58 Active Problems ICD-10 Encounter Code Description Active Date Diagnosis 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, stage 3 01/24/2016 Yes S14.103S Unspecified injury at C3 level  of cervical spinal cord, 01/24/2016 Yes sequela S51.011A Laceration without foreign body of right elbow, initial 07/03/2016 Yes encounter L89.322 Pressure ulcer of left buttock, stage 2 08/14/2016 Yes L89.310 Pressure ulcer of right buttock, unstageable 09/18/2016 Yes L03.317 Cellulitis of buttock 09/18/2016 Yes Inactive Problems Resolved Problems ICD-10 Code Description Active Date Resolved Date L89.622 Pressure ulcer of left heel, stage 2 06/26/2016 06/26/2016 Electronic Signature(s) Signed: 11/21/2016 8:10:28 AM By: Baltazar Najjar MD Entered By: Baltazar Najjar on 11/20/2016 16:14:54 Brandow, [name] ([id number]) Eckman, [name] ([id number]) -------------------------------------------------------------------------------- Progress Note Details Patient Name: [name] Bras C. Date of Service: 11/20/2016 3:30 PM Medical Record Number: [id number] Patient Account Number: [id number] Date of Birth/Sex: 1950-11-26 (67 y.o. 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 He has lower extremity sensation and can move his legs but has no/limited control over his arms. His wife accompanies him today and states that in the late spring or early summer of 2017 the patient became very depressed. He refused the refused to mobilize and he developed several pressure ulcers on his back. Most of these have healed however they have a recalcitrant area over the right scapula. They've been using Santyl on this for at least the last month. In terms of depression the patient is doing better now on an antidepressant. He saw his primary physician on 01/12/16 at which time there was apparently green drainage coming out of this area [Dr. Deana Jones]. Dr. Yetta Barre works in the [facility]. He has completed this Septra. Otherwise looking [facility] notes that he has a history of seizures. He also had a stroke in 2008 he follows with neurology. He also has type 2 diabetes on Glucophage, hyperlipidemia and gastroesophageal reflux. He takes Plavix for stroke prevention and Keppra for seizure prophylaxis. A recent CT scan of the head shows a chronic left middle cerebral artery infarct  in the left parietal lobe. 02/08/16; this is a patient with a pressure ulcer over the right scapula. His wife has been doing the dressing with Santyl and border foam change every second day. When he arrived here 2 weeks ago we did a fairly extensive mechanical debridement. We are asked medical modalities to go out to the home and see what they might be eligible for in terms of pressure-relief surfaces [level 2] but the wife states that they have not heard from them. 03/20/15; this is a patient we haven't seen in 5-6 weeks. This was largely due to transportation issues. They've been using Santyl and border foam changing every second day for a pressure injury over the right scapula. The patient has a C3-C4 spinal injury. We had also asked for a review by the people who supplied DME to look at his wheelchair cushion, mattress etc. I don't know that this ever happened. In the meantime the wound has done remarkably well current measurements 1.8 x 2 x 0.1 04/03/16; the patient's dimensions have gone up to 3 cm in diameter quite a deterioration from last time. He is also complaining of pain in this area which is new. 04/10/16; 3 x 1.5 x 0.1. No difference from last week. Culture I did last week was negative he has completed antibiotics. 04/24/16 2.4  x 2 x 0.1; wound generally looks smaller. Middle area that I had to debrided last time looks healthier. His son is fashioned a large piece of foam cut out where the patient's wound with hit the back of his wheelchair 05/01/16; wound is a same size however the surface of this looks better. The middle innkeeper area required a repeat debridement 05/15/16; wound is down and dimensions granulation still looks healthy. The medial aspect of this wound is now the deeper Divot. We'll see how this responds to further healing. We're using Hydrofera Blue 06/05/16; Wound 1.7x1.1x0.1 still using hyudrofera blue 06/26/16; the patient arrives back in clinic after a three-week  hiatus. He was hospitalized from 06/09/16 through [facility] ([id number]) 06/10/16. He was found to be confused. CT scan of the head showed nothing really acute. His sodium was low at 131. He was rehydrated. He was felt to have a UTI and given antibiotics and antibiotics at discharge although his final culture result only showed multiple organisms. His wife says today that at the time of the hospitalization they discovered a large intact blister over the large aspect of his left calcaneus. This is recently ruptured. The wound on his right scapula is somewhat larger. More his wife is concerned about his current status. She states that he is still confused sleeps for long periods. He is not having fever chills cough or diarrhea [1 loose bowel movement per day]. He continues to have a suprapubic catheter. She notes that he is not eating and drinking well. Patient states he just does not want to eat. He does not feel nauseated or vomit. 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 Admission sodium was 131 at discharge 139 BUN 22 and 1.22 at admission, 17 and 1 at discharge. 07/03/16; patient's mental status is back to normal. He has a new wound on the right elbow caused by traumatizing his elbow against the wall apparently at the dermatologist office last week. We continue with the original wound on the right scapula and the wound from 2 weeks ago on his left heel. We have been using silver alginate to the area on the heel. 08/14/16; patient has not been here in almost 6 weeks. When he was here last time he had the pressure ulcer on the right scapula, atraumatic wound on the right lateral elbow and an area on his left heel. His wife states that at one point all of these were healed and she didn't really feel he needed to come back here. Since then the patient has developed a reopening of the area on the right  scapula, a stage II wound on the left buttock and a reopening of the area on the right lateral elbow. As usual his wife as a litany of complaints against home health, she is dismissing or is going to dismiss well care. She tells [place] she has a long list of supplies at home already for some reason they were not felt to be eligible for a group 2 or 3 surface although they have a hospital bed at home but no offloading surface 08/28/16; the patient arrived today unfortunately incontinent of stool in spite of this the wounds all appear to be better including the right scapular area, his left buttock's left heel and the right lateral elbow. 09/18/16; this is a patient who arrived today for follow-up of a pressure area over his right scapula area, left buttock  and there was an area on his right lateral elbow. He arrived in clinic today with a worrisome area over the right initial tuberosity/right buttock. This had a necrotic surface with draining purulence. He has not been systemically unwell. The area over the left elbow healed. He arrived in clinic today with dressing that hadn't been changed over the scapula for 2 or 3 days per her intake. His wife is previously fired home health 09/25/16;; the last time the patient was here he arrived with a new grossly infected wound over the right ischial tuberosity. Culture I did not show a specific pathogen. I did think this required admission to hospital and he was admitted from 09/18/16 through 09/20/16. Initially given vanc and Zosyn but then discharged with 5 days' worth of Augmentin. An MRI was done that did not show osteomyelitis of the right ischial tuberosity however it did show cellulitis and possible myositis. He also has wounds on the left ischial tuberosity and the area over the right scapular area. 10/04/16 on evaluation today patient appears to be doing better in regard to his back wound and is stable in regard to the gluteal wounds. There does not appear to  be any evidence of significant infection at this point. He is tolerating the dressing changes currently. His wife has been performing the dressing changes. Nonetheless he does have some discomfort especially in the gluteal areas although he did not specifically rate the discomfort there were times during evaluation where he flinched and it was obvious it was bothering him. No fevers, chills, nausea, or vomiting noted at this time. 10/17/16; the patient has bilateral ischial tuberosity wounds right greater than left. The left seems to be making good progress wears the right has not really changed that much and dimensions. There is also some threatened area on the right side around the wound circumference. Equally worrisome there is a DTI over the lower sacral area however this is not open as of yet. His area over the left scapula which is the [facility]. ([id number]) chronic wound he was coming to the clinic continues to make excellent progress 8/28 The patient comes in today wanting to talk about getting back in his wheelchair. He is very bored lying in bed at home 11/14/16; three-week hiatus for this patient for medical illnesses whether etc. He was in the emergency room at [facility] 2 days ago when his wife noted odor coming out of the right initial tuberosity wound and discoloration. I reviewed his ER presentation from 11/12/16. White count was 11.7 differential count reasonably normal basic metabolic panel was normal. Serum lactate was 1.9. He has a suprapubic catheter which showed positive urine nitrate large leukocytes and many bacteria. Urine cultures come back showing multiple species. Blood culture was negative. Wound culture is still pending. He was put on Keflex and Bactrim. The patient had an MRI of this area on 09/18/16 that did not show osteomyelitis however at that time there was possible cellulitis and infectious myositis no drainable fluid collection. After this he wishes  admitted the hospital had vancomycin and Zosyn for 5 days and then 5 days of Augmentin 11/20/16; culture of the wound that I did last week showed multiple organisms. This was the same as from the emergency room as it turns out. He is still on Keflex and Bactrim and completing this. I was really expecting something a little more ominous although he seems to have stabilized. We have been using silver alginate to the major wound on the  right ischial tuberosity. He had a DTI over the right greater trochanter, superficial wound over the left ischial tuberosity and a DTI on the superior part of the left hemipelvis. FORTUNATELY everything looks a little better here than last week. At my suggestion his wife looked into [facility] but was told that he needed a 3 day acute hospital stay. This would be similar to what is required for skilled nursing and I was not specifically aware of this although it may have something to do with the patient's specific insurance Objective Constitutional Patient is hypotensive. He appears well. Pulse regular and within target range for patient.Marland Kitchen Respirations regular, non-labored and within target range.Marland Kitchen appears in no distress. Appears medically stable. Vitals Time Taken: 3:33 PM, Height: 70 in, Weight: 187 lbs, BMI: 26.8, Pulse: 86 bpm, Respiratory Rate: 16 breaths/min, Blood Pressure: 92/51 mmHg. General Notes: Wound exam; the patient looks somewhat better today. The worrisome unstageable area over the right ischial tuberosity was again debrided today with pickups and scalpel to remove necrotic muscle and debris. Hemostasis with silver nitrate and pressure His DTI over the right greater tuberosity and the left hemipelvis looked better. Stable appearance of the wound over the left ischial tuberosity Integumentary (Hair, Skin) [name], Lamonte C. ([id number]) Wound #1 status is Open. Original cause of wound was Pressure Injury. The wound is located on  the Right,Proximal Back. The wound measures 1cm length x 1.2cm width x 0.1cm depth; 0.942cm^2 area and 0.094cm^3 volume. Wound #4 status is Open. Original cause of wound was Pressure Injury. The wound is located on the Left Gluteus. The wound measures 2cm length x 2cm width x 0.1cm depth; 3.142cm^2 area and 0.314cm^3 volume. Wound #5 status is Open. Original cause of wound was Pressure Injury. The wound is located on the Right Gluteus. The wound measures 4cm length x 3.5cm width x 0.4cm depth; 10.996cm^2 area and 4.398cm^3 volume. Wound #6 status is Open. Original cause of wound was Pressure Injury. The wound is located on the Medial Sacrum. The wound measures 4cm length x 7cm width x 0.1cm depth; 21.991cm^2 area and 2.199cm^3 volume. Wound #7 status is Open. Original cause of wound was Pressure Injury. The wound is located on the Right Trochanter. The wound measures 1cm length x 1.2cm width x 0.1cm depth; 0.942cm^2 area and 0.094cm^3 volume. There is no tunneling or undermining noted. There is a none present amount of drainage noted. The wound margin is flat and intact. There is no granulation within the wound bed. There is a large (67-100%) amount of necrotic tissue within the wound bed including Eschar. 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 There was a Skin/Subcutaneous Tissue Debridement ([id number]) debridement with total area of 14.7 sq cm performed by Maxwell Caul, MD. with the following instrument(s): Blade and Forceps to remove Viable and  Non-Viable tissue/material including Fibrin/Slough, Eschar, and Subcutaneous after achieving pain control using Lidocaine 4% Topical Solution. A time out was conducted at 16:00, prior to the start of [place], IZMAEL DUROSS. ([id number]) the procedure. A Moderate amount of bleeding was controlled with Silver Nitrate. The procedure was tolerated  well with a pain level of 0 throughout and a pain level of 0 following the procedure. Post Debridement Measurements: 4.2cm length x 3.5cm width x 0.6cm depth; 6.927cm^3 volume. Post debridement Stage noted as Unstageable/Unclassified. Character of Wound/Ulcer Post Debridement is stable. Post procedure Diagnosis Wound #5: Same as Pre-Procedure Plan Wound Cleansing: Wound #1 Right,Proximal Back: Clean wound with Normal Saline. Cleanse wound with mild soap and water Wound #4 Left Gluteus: Clean wound with Normal Saline. Cleanse wound with mild soap and water Cleanse wound with mild soap and water Wound #5 Right Gluteus: Clean wound with Normal Saline. Cleanse wound with mild soap and water Cleanse wound with mild soap and water Wound #6 Medial Sacrum: Clean wound with Normal Saline. Cleanse wound with mild soap and water Cleanse wound with mild soap and water Anesthetic: Wound #1 Right,Proximal Back: Topical Lidocaine 4% cream applied to wound bed prior to debridement Wound #4 Left Gluteus: Topical Lidocaine 4% cream applied to wound bed prior to debridement Wound #5 Right Gluteus: Topical Lidocaine 4% cream applied to wound bed prior to debridement Wound #6 Medial Sacrum: Topical Lidocaine 4% cream applied to wound bed prior to debridement Skin Barriers/Peri-Wound Care: Wound #1 Right,Proximal Back: Skin Prep Wound #4 Left Gluteus: Skin Prep Skin Prep Wound #5 Right Gluteus: Skin Prep Skin Prep Wound #6 Medial Sacrum: Skin Prep [place], Burle C. ([id number]) Skin Prep Primary Wound Dressing: Wound #1 Right,Proximal Back: Aquacel  Ag Wound #4 Left Gluteus: Aquacel Ag Wound #5 Right Gluteus: Aquacel Ag Wound #6 Medial Sacrum: Aquacel Ag Secondary Dressing: Wound #1 Right,Proximal Back: Boardered Foam Dressing Wound #4 Left Gluteus: Boardered Foam Dressing Wound #5 Right Gluteus: Boardered Foam Dressing Wound #6 Medial Sacrum: Boardered Foam Dressing Dressing Change Frequency: Wound #1 Right,Proximal Back: Change dressing every other day. Wound #4 Left Gluteus: Change dressing every other day. Wound #5 Right Gluteus: Change dressing every other day. Wound #6 Medial Sacrum: Change dressing every other day. Follow-up Appointments: Wound #1 Right,Proximal Back: Return Appointment in 1 week. Wound #4 Left Gluteus: Return Appointment in 1 week. Wound #5 Right Gluteus: Return Appointment in 1 week. Wound #6 Medial Sacrum: Return Appointment in 1 week. Off-Loading: Wound #1 Right,Proximal Back: Roho cushion for wheelchair Turn and reposition every 2 hours Wound #4 Left Gluteus: Roho cushion for wheelchair Turn and reposition every 2 hours Wound #5 Right Gluteus: Roho cushion for wheelchair Turn and reposition every 2 hours Wound #6 Medial Sacrum: Roho cushion for wheelchair Turn and reposition every 2 hours Additional Orders / Instructions: COLLEN, VINCENT ([id number]) Wound #1 Right,Proximal Back: Increase protein intake. Other: - Apply to [facility] or [facility] Wound #4 Left Gluteus: Increase protein intake. Other: - Apply to [facility] or [facility] Wound #5 Right Gluteus: Increase protein intake. Other: - Apply to [facility] or [facility] Wound #6 Medial Sacrum: Increase protein intake. Other: - Apply to [facility] or [facility] Medications-please add to medication list.: Wound #1 Right,Proximal Back: Other: - Vitamins A, C and Zinc Wound #4 Left Gluteus: Other: - Vitamins A, C and Zinc Wound #5  Right Gluteus: Other: - Vitamins A, C and Zinc Wound #6 Medial Sacrum: Other: - Vitamins A, C and Zinc #1 extensive debridement of necrotic material leaving open muscle but a little more viable base. This is likely to require further debridement #2 continue silver alginate, although the culture was negative the odor and drainage suggested infection. Perhaps successfully treated by the antibiotics he was  already on. #3 is deep tissue injuries/DTI both look better. The ulcer on the left side looks stable #4 for now all of his wounds appear stable especially the area on the right scapula #5 I suspect for whatever reason last week he was not being offloaded he clearly is now in the DTI's are fortunately looking better. Also the area over the right initial tuberosity looks much better than last week although it still requires an aggressive debridement. #6 with regards to facility placement/hospitalization I don't think any is required today, his wife is fairly adamant that she can do as good a job and I suspect this is true. We'll continue to be vigilant. Electronic Signature(s) Signed: 11/21/2016 8:09:47 AM By: Baltazar Najjar MD Entered By: Baltazar Najjar on 11/21/2016 08:09:47 [place], [name] ([id number]) [address] [place] ([id number]) -------------------------------------------------------------------------------- SuperBill Details Patient Name: DEMONT, LINFORD. Date of Service: 11/20/2016 Medical Record Number: [id number] Patient Account Number: [id number] Date of Birth/Sex: 09-Apr-1950 (66 y.o. Male) Treating RN: Huel Coventry Primary Care Provider: Elizabeth Sauer Other Clinician: Referring Provider: Elizabeth Sauer Treating Provider/Extender: Maxwell Caul Weeks in Treatment: 19 Diagnosis Coding ICD-10 Codes Code Description [id number] Pressure ulcer of other site, stage 3 S14.103S Unspecified injury at C3 level of cervical spinal cord, sequela S51.011A Laceration without foreign body  of right elbow, initial encounter L89.322 Pressure ulcer of left buttock, stage 2 L89.310 Pressure ulcer of right buttock, unstageable L03.317 Cellulitis of buttock Facility Procedures CPT4 Code: [id number] Description: 11042 - DEB SUBQ TISSUE 20 SQ CM/< ICD-10 Description Diagnosis L89.310 Pressure ulcer of right buttock, unstageable Modifier: Quantity: 1 Physician Procedures CPT4 Code: [id number] Description: 11042 - WC PHYS SUBQ TISS 20 SQ CM ICD-10 Description Diagnosis L89.310 Pressure ulcer of right buttock, unstageable Modifier: Quantity: 1 Electronic Signature(s) Signed: 11/21/2016 8:10:28 AM By: Baltazar Najjar MD Previous Signature: 11/20/2016 4:48:13 PM Version By: Elliot Gurney, BSN, RN, CWS, Kim RN, BSN Entered By: Baltazar Najjar on 11/21/2016 08:10:03

## 2016-11-27 ENCOUNTER — Encounter: Payer: Medicare Other | Attending: Internal Medicine | Admitting: Internal Medicine

## 2016-11-27 DIAGNOSIS — Z8673 Personal history of transient ischemic attack (TIA), and cerebral infarction without residual deficits: Secondary | ICD-10-CM | POA: Insufficient documentation

## 2016-11-27 DIAGNOSIS — X58XXXS Exposure to other specified factors, sequela: Secondary | ICD-10-CM | POA: Insufficient documentation

## 2016-11-27 DIAGNOSIS — E119 Type 2 diabetes mellitus without complications: Secondary | ICD-10-CM | POA: Diagnosis not present

## 2016-11-27 DIAGNOSIS — S51011A Laceration without foreign body of right elbow, initial encounter: Secondary | ICD-10-CM | POA: Insufficient documentation

## 2016-11-27 DIAGNOSIS — L89100 Pressure ulcer of unspecified part of back, unstageable: Secondary | ICD-10-CM | POA: Diagnosis not present

## 2016-11-27 DIAGNOSIS — Z7984 Long term (current) use of oral hypoglycemic drugs: Secondary | ICD-10-CM | POA: Insufficient documentation

## 2016-11-27 DIAGNOSIS — K219 Gastro-esophageal reflux disease without esophagitis: Secondary | ICD-10-CM | POA: Diagnosis not present

## 2016-11-27 DIAGNOSIS — I959 Hypotension, unspecified: Secondary | ICD-10-CM | POA: Insufficient documentation

## 2016-11-27 DIAGNOSIS — L89310 Pressure ulcer of right buttock, unstageable: Secondary | ICD-10-CM | POA: Insufficient documentation

## 2016-11-27 DIAGNOSIS — L03317 Cellulitis of buttock: Secondary | ICD-10-CM | POA: Insufficient documentation

## 2016-11-27 DIAGNOSIS — L89322 Pressure ulcer of left buttock, stage 2: Secondary | ICD-10-CM | POA: Insufficient documentation

## 2016-11-27 DIAGNOSIS — F329 Major depressive disorder, single episode, unspecified: Secondary | ICD-10-CM | POA: Insufficient documentation

## 2016-11-27 DIAGNOSIS — S31819A Unspecified open wound of right buttock, initial encounter: Secondary | ICD-10-CM | POA: Diagnosis not present

## 2016-11-27 DIAGNOSIS — L89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, stage 3: Secondary | ICD-10-CM | POA: Diagnosis not present

## 2016-11-27 DIAGNOSIS — S14103S Unspecified injury at C3 level of cervical spinal cord, sequela: Secondary | ICD-10-CM | POA: Diagnosis not present

## 2016-11-27 DIAGNOSIS — R569 Unspecified convulsions: Secondary | ICD-10-CM | POA: Insufficient documentation

## 2016-11-27 DIAGNOSIS — G8252 Quadriplegia, C1-C4 incomplete: Secondary | ICD-10-CM | POA: Insufficient documentation

## 2016-11-27 DIAGNOSIS — S71001A Unspecified open wound, right hip, initial encounter: Secondary | ICD-10-CM | POA: Diagnosis not present

## 2016-11-27 DIAGNOSIS — S31829A Unspecified open wound of left buttock, initial encounter: Secondary | ICD-10-CM | POA: Diagnosis not present

## 2016-11-28 DIAGNOSIS — L89130 Pressure ulcer of right lower back, unstageable: Secondary | ICD-10-CM | POA: Diagnosis not present

## 2016-11-29 NOTE — Progress Notes (Signed)
DOMENIQUE, SOUTHERS ([id number]) Visit Report for 11/27/2016 Arrival Information Details Patient Name: [name], [name]. Date of Service: 11/27/2016 3:30 PM Medical Record Number: [id number] Patient Account Number: [id number] Date of Birth/Sex: 1950-05-10 (66 y.o. Male) Treating RN: Cornell Barman Primary Care Cniyah Sproull: Otilio Miu Other Clinician: Referring Kenyonna Micek: Otilio Miu Treating Jazira Maloney/Extender: Tito Dine in Treatment: 64 Visit Information History Since Last Visit Added or deleted any medications: No Patient Arrived: Wheel Chair Any new allergies or adverse reactions: No Arrival Time: 15:22 Had a fall or experienced change in No activities of daily living that may affect Accompanied By: wife risk of falls: Transfer Assistance: None Signs or symptoms of abuse/neglect since last No Patient Identification Verified: Yes visito Secondary Verification Process Yes Hospitalized since last visit: No Completed: Has Dressing in Place as Prescribed: Yes Patient Requires Transmission-Based No Pain Present Now: No Precautions: Patient Has Alerts: Yes Electronic Signature(s) Signed: 11/28/2016 4:42:06 PM By: Gretta Cool, BSN, RN, CWS, Kim RN, BSN Entered By: Gretta Cool, BSN, RN, CWS, Kim on 11/27/2016 15:22:55 [name], [name] ([id number]) -------------------------------------------------------------------------------- Clinic Level of Care Assessment Details Patient Name: Byus, Coreyon C. Date of Service: 11/27/2016 3:30 PM Medical Record Number: [id number] Patient Account Number: [id number] Date of Birth/Sex: December 02, 1950 (66 y.o. Male) Treating RN: Cornell Barman Primary Care Aitanna Haubner: Otilio Miu Other Clinician: Referring Chrisa Hassan: Otilio Miu Treating Shardai Star/Extender: Tito Dine in Treatment: 35 Clinic Level of Care Assessment Items TOOL 4 Quantity Score '[]'$  - Use when only an EandM is performed on FOLLOW-UP visit 0 ASSESSMENTS - Nursing Assessment /  Reassessment '[]'$  - Reassessment of Co-morbidities (includes updates in patient status) 0 X - Reassessment of Adherence to Treatment Plan 1 5 ASSESSMENTS - Wound and Skin Assessment / Reassessment '[]'$  - Simple Wound Assessment / Reassessment - one wound 0 X - Complex Wound Assessment / Reassessment - multiple wounds 5 5 '[]'$  - Dermatologic / Skin Assessment (not related to wound area) 0 ASSESSMENTS - Focused Assessment '[]'$  - Circumferential Edema Measurements - multi extremities 0 '[]'$  - Nutritional Assessment / Counseling / Intervention 0 '[]'$  - Lower Extremity Assessment (monofilament, tuning fork, pulses) 0 '[]'$  - Peripheral Arterial Disease Assessment (using hand held doppler) 0 ASSESSMENTS - Ostomy and/or Continence Assessment and Care '[]'$  - Incontinence Assessment and Management 0 '[]'$  - Ostomy Care Assessment and Management (repouching, etc.) 0 PROCESS - Coordination of Care X - Simple Patient / Family Education for ongoing care 1 15 '[]'$  - Complex (extensive) Patient / Family Education for ongoing care 0 X - Staff obtains Programmer, systems, Records, Test Results / Process Orders 1 10 '[]'$  - Staff telephones HHA, Nursing Homes / Clarify orders / etc 0 '[]'$  - Routine Transfer to another Facility (non-emergent condition) 0 [name], [name] ([id number]) '[]'$  - Routine Hospital Admission (non-emergent condition) 0 '[]'$  - New Admissions / Biomedical engineer / Ordering NPWT, Apligraf, etc. 0 '[]'$  - Emergency Hospital Admission (emergent condition) 0 X - Simple Discharge Coordination 1 10 '[]'$  - Complex (extensive) Discharge Coordination 0 PROCESS - Special Needs '[]'$  - Pediatric / Minor Patient Management 0 '[]'$  - Isolation Patient Management 0 '[]'$  - Hearing / Language / Visual special needs 0 '[]'$  - Assessment of Community assistance (transportation, D/C planning, etc.) 0 '[]'$  - Additional assistance / Altered mentation 0 '[]'$  - Support Surface(s) Assessment (bed, cushion, seat, etc.) 0 INTERVENTIONS - Wound Cleansing /  Measurement '[]'$  - Simple Wound Cleansing - one wound 0 X - Complex Wound Cleansing - multiple wounds 5 5 X - Wound Imaging (  photographs - any number of wounds) 1 5 '[]'$  - Wound Tracing (instead of photographs) 0 '[]'$  - Simple Wound Measurement - one wound 0 X - Complex Wound Measurement - multiple wounds 5 5 INTERVENTIONS - Wound Dressings '[]'$  - Small Wound Dressing one or multiple wounds 0 X - Medium Wound Dressing one or multiple wounds 5 15 '[]'$  - Large Wound Dressing one or multiple wounds 0 '[]'$  - Application of Medications - topical 0 '[]'$  - Application of Medications - injection 0 INTERVENTIONS - Miscellaneous '[]'$  - External ear exam 0 [name], [name] ([id number]) '[]'$  - Specimen Collection (cultures, biopsies, blood, body fluids, etc.) 0 '[]'$  - Specimen(s) / Culture(s) sent or taken to Lab for analysis 0 '[]'$  - Patient Transfer (multiple staff / Harrel Lemon Lift / Similar devices) 0 '[]'$  - Simple Staple / Suture removal (25 or less) 0 '[]'$  - Complex Staple / Suture removal (26 or more) 0 '[]'$  - Hypo / Hyperglycemic Management (close monitor of Blood Glucose) 0 '[]'$  - Ankle / Brachial Index (ABI) - do not check if billed separately 0 X - Vital Signs 1 5 Has the patient been seen at the hospital within the last three years: Yes Total Score: 200 Level Of Care: New/Established - Level 5 Electronic Signature(s) Signed: 11/28/2016 4:42:06 PM By: Gretta Cool, BSN, RN, CWS, Kim RN, BSN Entered By: Gretta Cool, BSN, RN, CWS, Kim on 11/28/2016 11:51:46 [name], [name] ([id number]) -------------------------------------------------------------------------------- Encounter Discharge Information Details Patient Name: [name], [name] Date of Service: 11/27/2016 3:30 PM Medical Record Number: [id number] Patient Account Number: [id number] Date of Birth/Sex: [birth year] (66 y.o. Male) Treating RN: Cornell Barman Primary Care Rhyann Berton: Otilio Miu Other Clinician: Referring Zackrey Dyar: Otilio Miu Treating Twilla Khouri/Extender: Tito Dine in Treatment: 52 Encounter Discharge Information Items Discharge Pain Level: 0 Discharge Condition: Stable Ambulatory Status: Wheelchair Discharge Destination: Home Private Transportation: Auto Accompanied By: wife Schedule Follow-up Appointment: Yes Medication Reconciliation completed and Yes provided to Patient/Care Korbyn Chopin: Clinical Summary of Care: Electronic Signature(s) Signed: 11/28/2016 11:55:21 AM By: Gretta Cool, BSN, RN, CWS, Kim RN, BSN Entered By: Gretta Cool, BSN, RN, CWS, Kim on 11/28/2016 11:55:20 Britten, [name] ([id number]) -------------------------------------------------------------------------------- Lower Extremity Assessment Details Patient Name: Kromer, Jobie C. Date of Service: 11/27/2016 3:30 PM Medical Record Number: [id number] Patient Account Number: [id number] Date of Birth/Sex: [birth year] (66 y.o. Male) Treating RN: Cornell Barman Primary Care Villa Burgin: Otilio Miu Other Clinician: Referring Kimari Lienhard: Otilio Miu Treating Preeya Cleckley/Extender: Tito Dine in Treatment: 40 Electronic Signature(s) Signed: 11/27/2016 4:50:22 PM By: Gretta Cool, BSN, RN, CWS, Kim RN, BSN Entered By: Gretta Cool, BSN, RN, CWS, Kim on 11/27/2016 16:50:22 [name], [name] ([id number]) -------------------------------------------------------------------------------- Multi Wound Chart Details Patient Name: KELDON, LASSEN C. Date of Service: 11/27/2016 3:30 PM Medical Record Number: [id number] Patient Account Number: [id number] Date of Birth/Sex: 10/08/1950 (66 y.o. Male) Treating RN: Cornell Barman Primary Care Barrie Sigmund: Otilio Miu Other Clinician: Referring Oluwanifemi Petitti: Otilio Miu Treating Tanice Petre/Extender: Ricard Dillon Weeks in Treatment: 30 Vital Signs Height(in): 70 Pulse(bpm): 80 Weight(lbs): 187 Blood Pressure 86/58 (mmHg): Body Mass Index(BMI): 27 Temperature(F): 91.8 Respiratory Rate 16 (breaths/min): Photos: [1:No Photos] [4:No Photos] [5:No  Photos] Wound Location: [1:Right, Proximal Back] [4:Left Gluteus] [5:Right Gluteus] Wounding Event: [1:Pressure Injury] [4:Pressure Injury] [5:Pressure Injury] Primary Etiology: [1:Pressure Ulcer] [4:Pressure Ulcer] [5:Pressure Ulcer] Comorbid History: [1:N/A] [4:N/A] [5:Coronary Artery Disease, Type II Diabetes, Quadriplegia, Seizure Disorder] Date Acquired: [1:11/24/2015] [4:07/09/2016] [5:09/10/2016] Weeks of Treatment: [1:44] [4:15] [5:10] Wound Status: [1:Open] [4:Open] [5:Open] Measurements L x W x D 0.9x1x0.1 [4:3x2.5x0.1] [5:4.1x4x1.1] (cm) Area (  cm) : [1:0.707] [4:5.89] [5:12.881] Volume (cm) : [1:0.071] [4:0.589] [5:14.169] % Reduction in Area: [1:91.20%] [4:-3651.60%] [5:-127.80%] % Reduction in Volume: 91.20% [4:-3581.20%] [5:-2407.80%] Starting Position 1 [5:4] (o'clock): Ending Position 1 [5:10] (o'clock): Maximum Distance 1 [5:1.2] (cm): Undermining: [1:N/A] [4:N/A] [5:Yes] Classification: [1:Category/Stage II] [4:Category/Stage II] [5:Unstageable/Unclassified] Periwound Skin Texture: No Abnormalities Noted [4:No Abnormalities Noted] [5:No Abnormalities Noted] Periwound Skin [1:No Abnormalities Noted] [4:No Abnormalities Noted] [5:No Abnormalities Noted] Moisture: Periwound Skin Color: No Abnormalities Noted [1:No] [4:No Abnormalities Noted No] [5:No Abnormalities Noted No] Tenderness on Palpation: Wound Number: 6 7 N/A Photos: No Photos No Photos N/A Wound Location: Medial Sacrum Right Trochanter N/A Wounding Event: Pressure Injury Pressure Injury N/A Primary Etiology: Pressure Ulcer Pressure Ulcer N/A Comorbid History: N/A N/A N/A Date Acquired: 10/16/2016 11/06/2016 N/A Weeks of Treatment: 5 1 N/A Wound Status: Open Open N/A Measurements L x W x D 4x8x0.1 0.6x1x0.1 N/A (cm) Area (cm) : 25.133 0.471 N/A Volume (cm) : 2.513 0.047 N/A % Reduction in Area: -18.50% 50.00% N/A % Reduction in Volume: -18.50% 50.00% N/A Undermining: N/A N/A N/A Classification:  Category/Stage II Unstageable/Unclassified N/A Periwound Skin Texture: No Abnormalities Noted No Abnormalities Noted N/A Periwound Skin No Abnormalities Noted No Abnormalities Noted N/A Moisture: Periwound Skin Color: No Abnormalities Noted No Abnormalities Noted N/A Tenderness on No No N/A Palpation: Treatment Notes Electronic Signature(s) Signed: 11/27/2016 4:50:40 PM By: Gretta Cool, BSN, RN, CWS, Kim RN, BSN Entered By: Gretta Cool, BSN, RN, CWS, Kim on 11/27/2016 16:50:40 [name], [name] ([id number]) -------------------------------------------------------------------------------- Multi-Disciplinary Care Plan Details Patient Name: DWAYNE, BULKLEY C. Date of Service: 11/27/2016 3:30 PM Medical Record Number: [id number] Patient Account Number: [id number] Date of Birth/Sex: 1950-11-06 (66 y.o. Male) Treating RN: Cornell Barman Primary Care Sy Saintjean: Otilio Miu Other Clinician: Referring Kyomi Hector: Otilio Miu Treating Javoris Star/Extender: Ricard Dillon Weeks in Treatment: 23 Active Inactive ` Nutrition Nursing Diagnoses: Imbalanced nutrition Impaired glucose control: actual or potential Goals: Patient/caregiver agrees to and verbalizes understanding of need to use nutritional supplements and/or vitamins as prescribed Date Initiated: 01/24/2016 Target Resolution Date: 03/27/2016 Goal Status: Active Patient/caregiver will maintain therapeutic glucose control Date Initiated: 01/24/2016 Target Resolution Date: 03/27/2016 Goal Status: Active Interventions: Assess patient nutrition upon admission and as needed per policy Provide education on elevated blood sugars and impact on wound healing Notes: ` Orientation to the Wound Care Program Nursing Diagnoses: Knowledge deficit related to the wound healing center program Goals: Patient/caregiver will verbalize understanding of the [facility] Date Initiated: 01/24/2016 Target Resolution Date: 03/27/2016 Goal Status:  Active Interventions: Provide education on orientation to the wound center Notes: CHORD, TAKAHASHI ([id number]) ` Pain, Acute or Chronic Nursing Diagnoses: Pain, acute or chronic: actual or potential Potential alteration in comfort, pain Goals: Patient will verbalize adequate pain control and receive pain control interventions during procedures as needed Date Initiated: 01/24/2016 Target Resolution Date: 03/27/2016 Goal Status: Active Patient/caregiver will verbalize adequate pain control between visits Date Initiated: 01/24/2016 Target Resolution Date: 03/27/2016 Goal Status: Active Patient/caregiver will verbalize comfort level met Date Initiated: 01/24/2016 Target Resolution Date: 03/27/2016 Goal Status: Active Interventions: Assess comfort goal upon admission Complete pain assessment as per visit requirements Notes: ` Pressure Nursing Diagnoses: Knowledge deficit related to management of pressures ulcers Potential for impaired tissue integrity related to pressure, friction, moisture, and shear Goals: Patient will remain free from development of additional pressure ulcers Date Initiated: 01/24/2016 Target Resolution Date: 03/27/2016 Goal Status: Active Interventions: Assess: immobility, friction, shearing, incontinence upon admission and as needed Assess offloading mechanisms upon admission  and as needed Assess potential for pressure ulcer upon admission and as needed Provide education on pressure ulcers Notes: NELTON, AMSDEN ([id number]) Wound/Skin Impairment Nursing Diagnoses: Impaired tissue integrity Goals: Ulcer/skin breakdown will have a volume reduction of 30% by week 4 Date Initiated: 01/24/2016 Target Resolution Date: 03/27/2016 Goal Status: Active Ulcer/skin breakdown will have a volume reduction of 50% by week 8 Date Initiated: 01/24/2016 Target Resolution Date: 03/27/2016 Goal Status: Active Ulcer/skin breakdown will have a volume reduction of  80% by week 12 Date Initiated: 01/24/2016 Target Resolution Date: 03/27/2016 Goal Status: Active Interventions: Assess patient/caregiver ability to perform ulcer/skin care regimen upon admission and as needed Assess ulceration(s) every visit Notes: Electronic Signature(s) Signed: 11/27/2016 4:50:33 PM By: Elliot Gurney, BSN, RN, CWS, Kim RN, BSN Entered By: Elliot Gurney, BSN, RN, CWS, Kim on 11/27/2016 16:50:32 Almon, [name] ([id number]) -------------------------------------------------------------------------------- Pain Assessment Details Patient Name: [name] Date of Service: 11/27/2016 3:30 PM Medical Record Number: [id number] Patient Account Number: [id number] Date of Birth/Sex: [birth year] (66 y.o. Male) Treating RN: Huel Coventry Primary Care Pamelia Botto: Elizabeth Sauer Other Clinician: Referring Lakea Mittelman: Elizabeth Sauer Treating Vonzell Lindblad/Extender: Altamese [place] in Treatment: 66 Active Problems Location of Pain Severity and Description of Pain Patient Has Paino No Site Locations With Dressing Change: No Pain Management and Medication Current Pain Management: Electronic Signature(s) Signed: 11/28/2016 4:42:06 PM By: Elliot Gurney, BSN, RN, CWS, Kim RN, BSN Entered By: Elliot Gurney, BSN, RN, CWS, Kim on 11/27/2016 15:23:20 Manseau, [name] ([id number]) -------------------------------------------------------------------------------- Patient/Caregiver Education Details Patient Name: [name]. Date of Service: 11/27/2016 3:30 PM Medical Record Number: [id number] Patient Account Number: [id number] Date of Birth/Gender: 10-Jul-1950 (66 y.o. Male) Treating RN: Huel Coventry Primary Care Physician: Elizabeth Sauer Other Clinician: Referring Physician: Elizabeth Sauer Treating Physician/Extender: Altamese [place] in Treatment: 103 Education Assessment Education Provided To: Patient Education Topics Provided Welcome To The Wound Care Center: Wound/Skin Impairment: Handouts: Caring for  Your Ulcer, Other: wound care as prescribed Methods: Demonstration Responses: State content correctly Electronic Signature(s) Signed: 11/28/2016 4:42:06 PM By: Elliot Gurney, BSN, RN, CWS, Kim RN, BSN Entered By: Elliot Gurney, BSN, RN, CWS, Kim on 11/28/2016 11:55:41 Reeder, [name] ([id number]) -------------------------------------------------------------------------------- Wound Assessment Details Patient Name: SELIM, DURDEN C. Date of Service: 11/27/2016 3:30 PM Medical Record Number: [id number] Patient Account Number: [id number] Date of Birth/Sex: 25-Oct-1950 (65 y.o. Male) Treating RN: Huel Coventry Primary Care Jade Burkard: Elizabeth Sauer Other Clinician: Referring Tejon Gracie: Elizabeth Sauer Treating Belia Febo/Extender: Maxwell Caul Weeks in Treatment: 44 Wound Status Wound Number: 1 Primary Pressure Ulcer Etiology: Wound Location: Right Back - Proximal Wound Open Wounding Event: Pressure Injury Status: Date Acquired: 11/24/2015 Comorbid Coronary Artery Disease, Type II Weeks Of Treatment: 44 History: Diabetes, Quadriplegia, Seizure Clustered Wound: No Disorder Photos Wound Measurements Length: (cm) 0.9 Width: (cm) 1 Depth: (cm) 0.1 Area: (cm) 0.707 Volume: (cm) 0.071 % Reduction in Area: 91.2% % Reduction in Volume: 91.2% Epithelialization: Small (1-33%) Tunneling: No Undermining: No Wound Description Classification: Category/Stage II Wound Margin: Flat and Intact Exudate Amount: Medium Exudate Type: Serous Exudate Color: amber Foul Odor After Cleansing: No Slough/Fibrino Yes Wound Bed Granulation Amount: Large (67-100%) Exposed Structure Granulation Quality: Red Fascia Exposed: No Necrotic Amount: Small (1-33%) Fat Layer (Subcutaneous Tissue) Exposed: Yes Necrotic Quality: Adherent Slough Tendon Exposed: No Muscle Exposed: No Joint Exposed: No Bone Exposed: No [name], [name] ([id number]) Periwound Skin Texture Texture Color No Abnormalities Noted: No No  Abnormalities Noted: No Callus: No Atrophie Blanche: No Crepitus: No Cyanosis: No Excoriation: Yes Ecchymosis: No  Induration: No Erythema: No Rash: No Hemosiderin Staining: No Scarring: Yes Mottled: No Pallor: No Moisture Rubor: No No Abnormalities Noted: No Dry / Scaly: No Temperature / Pain Maceration: No Temperature: No Abnormality Tenderness on Palpation: Yes Wound Preparation Ulcer Cleansing: Rinsed/Irrigated with Saline Topical Anesthetic Applied: Other: lidocaine 4%, Treatment Notes Wound #1 (Right, Proximal Back) 1. Cleansed with: Clean wound with Normal Saline 2. Anesthetic Topical Lidocaine 4% cream to wound bed prior to debridement 3. Peri-wound Care: Skin Prep 4. Dressing Applied: Aquacel Ag 5. Secondary Dressing Applied Bordered Foam Dressing Electronic Signature(s) Signed: 11/28/2016 2:21:41 PM By: Gretta Cool, BSN, RN, CWS, Kim RN, BSN Entered By: Gretta Cool, BSN, RN, CWS, Kim on 11/28/2016 14:21:40 Fratus, [name] ([id number]) -------------------------------------------------------------------------------- Wound Assessment Details Patient Name: JERRAN, TAPPAN C. Date of Service: 11/27/2016 3:30 PM Medical Record Number: [id number] Patient Account Number: [id number] Date of Birth/Sex: 11/14/1950 (66 y.o. Male) Treating RN: Cornell Barman Primary Care Jakye Mullens: Otilio Miu Other Clinician: Referring Imo Cumbie: Otilio Miu Treating Elcie Pelster/Extender: Ricard Dillon Weeks in Treatment: 44 Wound Status Wound Number: 4 Primary Pressure Ulcer Etiology: Wound Location: Left Gluteus Wound Open Wounding Event: Pressure Injury Status: Date Acquired: 07/09/2016 Comorbid Coronary Artery Disease, Type II Weeks Of Treatment: 15 History: Diabetes, Quadriplegia, Seizure Clustered Wound: No Disorder Photos Wound Measurements Length: (cm) 3 Width: (cm) 2.5 Depth: (cm) 0.1 Area: (cm) 5.89 Volume: (cm) 0.589 % Reduction in Area: -3651.6% % Reduction in  Volume: -3581.2% Epithelialization: Small (1-33%) Tunneling: No Undermining: No Wound Description Classification: Category/Stage II Wound Margin: Flat and Intact Exudate Amount: Large Exudate Type: Serosanguineous Exudate Color: red, brown Foul Odor After Cleansing: No Slough/Fibrino Yes Wound Bed Granulation Amount: Medium (34-66%) Exposed Structure Granulation Quality: Red Fascia Exposed: No Necrotic Amount: Medium (34-66%) Fat Layer (Subcutaneous Tissue) Exposed: Yes Necrotic Quality: Adherent Slough Tendon Exposed: No Muscle Exposed: No Joint Exposed: No Bone Exposed: No [name], [name] ([id number]) Periwound Skin Texture Texture Color No Abnormalities Noted: No No Abnormalities Noted: No Callus: No Atrophie Blanche: No Crepitus: No Cyanosis: No Excoriation: No Ecchymosis: No Induration: No Erythema: Yes Rash: No Erythema Location: Circumferential Scarring: Yes Hemosiderin Staining: No Mottled: No Moisture Pallor: No No Abnormalities Noted: No Rubor: No Dry / Scaly: No Maceration: No Wound Preparation Ulcer Cleansing: Rinsed/Irrigated with Saline Topical Anesthetic Applied: Other: lidocaine 4%, Treatment Notes Wound #4 (Left Gluteus) 1. Cleansed with: Clean wound with Normal Saline 2. Anesthetic Topical Lidocaine 4% cream to wound bed prior to debridement 3. Peri-wound Care: Skin Prep 4. Dressing Applied: Aquacel Ag 5. Secondary Dressing Applied Bordered Foam Dressing Electronic Signature(s) Signed: 11/28/2016 2:22:42 PM By: Gretta Cool, BSN, RN, CWS, Kim RN, BSN Entered By: Gretta Cool, BSN, RN, CWS, Kim on 11/28/2016 14:22:41 [name], [name] ([id number]) -------------------------------------------------------------------------------- Wound Assessment Details Patient Name: DONZEL, ROMACK C. Date of Service: 11/27/2016 3:30 PM Medical Record Number: [id number] Patient Account Number: [id number] Date of Birth/Sex: 07-16-1950 (66 y.o. Male) Treating RN:  Cornell Barman Primary Care Casy Brunetto: Otilio Miu Other Clinician: Referring Keashia Haskins: Otilio Miu Treating Raffaele Derise/Extender: Ricard Dillon Weeks in Treatment: 67 Wound Status Wound Number: 5 Primary Pressure Ulcer Etiology: Wound Location: Right Gluteus Wound Open Wounding Event: Pressure Injury Status: Date Acquired: 09/10/2016 Comorbid Coronary Artery Disease, Type II Weeks Of Treatment: 10 History: Diabetes, Quadriplegia, Seizure Clustered Wound: No Disorder Photos Wound Measurements Length: (cm) 4.1 Width: (cm) 4 Depth: (cm) 1.1 Area: (cm) 12.881 Volume: (cm) 14.169 % Reduction in Area: -127.8% % Reduction in Volume: -2407.8% Undermining: Yes Starting Position (o'clock): 4 Ending Position (o'clock): 10  Maximum Distance: (cm) 1.2 Wound Description Classification: Category/Stage IV Wound Margin: Flat and Intact Exudate Amount: Large Exudate Type: Serous Exudate Color: amber Foul Odor After Cleansing: No Slough/Fibrino Yes Wound Bed Granulation Amount: Small (1-33%) Exposed Structure Granulation Quality: Red Fascia Exposed: No Necrotic Amount: Large (67-100%) Fat Layer (Subcutaneous Tissue) Exposed: Yes Necrotic Quality: Adherent Slough Tendon Exposed: No Muscle Exposed: Yes [name], Janthony C. ([id number]) Necrosis of Muscle: No Joint Exposed: No Bone Exposed: No Periwound Skin Texture Texture Color No Abnormalities Noted: No No Abnormalities Noted: No Erythema: Yes Moisture Erythema Location: Circumferential No Abnormalities Noted: No Temperature / Pain Temperature: No Abnormality Tenderness on Palpation: Yes Wound Preparation Ulcer Cleansing: Rinsed/Irrigated with Saline Topical Anesthetic Applied: Other: lidocaine 4%, Treatment Notes Wound #5 (Right Gluteus) 1. Cleansed with: Clean wound with Normal Saline 2. Anesthetic Topical Lidocaine 4% cream to wound bed prior to debridement 3. Peri-wound Care: Skin Prep 4. Dressing  Applied: Aquacel Ag 5. Secondary Dressing Applied Bordered Foam Dressing Electronic Signature(s) Signed: 11/28/2016 2:24:08 PM By: Gretta Cool, BSN, RN, CWS, Kim RN, BSN Entered By: Gretta Cool, BSN, RN, CWS, Kim on 11/28/2016 14:24:08 [place], [name] ([id number]) -------------------------------------------------------------------------------- Wound Assessment Details Patient Name: JARID, SASSO C. Date of Service: 11/27/2016 3:30 PM Medical Record Number: [id number] Patient Account Number: [id number] Date of Birth/Sex: 1950-08-27 (66 y.o. Male) Treating RN: Cornell Barman Primary Care Ulas Zuercher: Otilio Miu Other Clinician: Referring Marlissa Emerick: Otilio Miu Treating Fonda Rochon/Extender: Ricard Dillon Weeks in Treatment: 44 Wound Status Wound Number: 6 Primary Pressure Ulcer Etiology: Wound Location: Sacrum - Medial Wound Open Wounding Event: Pressure Injury Status: Date Acquired: 10/16/2016 Comorbid Coronary Artery Disease, Type II Weeks Of Treatment: 5 History: Diabetes, Quadriplegia, Seizure Clustered Wound: No Disorder Photos Wound Measurements Length: (cm) 4 Width: (cm) 8 Depth: (cm) 0.1 Area: (cm) 25.133 Volume: (cm) 2.513 % Reduction in Area: -18.5% % Reduction in Volume: -18.5% Epithelialization: None Tunneling: No Undermining: No Wound Description Classification: Category/Stage II Wound Margin: Indistinct, nonvisible Exudate Amount: Large Exudate Type: Serous Exudate Color: amber Foul Odor After Cleansing: No Slough/Fibrino Yes Wound Bed Granulation Amount: Medium (34-66%) Exposed Structure Granulation Quality: Red Fascia Exposed: No Necrotic Amount: Medium (34-66%) Fat Layer (Subcutaneous Tissue) Exposed: Yes Necrotic Quality: Adherent Slough Tendon Exposed: No Muscle Exposed: No Joint Exposed: No Bone Exposed: No Stauber, Javen C. ([id number]) Periwound Skin Texture Texture Color No Abnormalities Noted: No No Abnormalities Noted: No Callus:  No Atrophie Blanche: No Crepitus: No Cyanosis: No Excoriation: No Ecchymosis: Yes Induration: No Erythema: No Rash: No Hemosiderin Staining: No Scarring: Yes Mottled: No Pallor: No Moisture Rubor: No No Abnormalities Noted: No Dry / Scaly: No Temperature / Pain Maceration: No Temperature: No Abnormality Tenderness on Palpation: Yes Wound Preparation Ulcer Cleansing: Rinsed/Irrigated with Saline Topical Anesthetic Applied: Other: lidocaine 4%, Treatment Notes Wound #6 (Medial Sacrum) 1. Cleansed with: Clean wound with Normal Saline 2. Anesthetic Topical Lidocaine 4% cream to wound bed prior to debridement 3. Peri-wound Care: Skin Prep 4. Dressing Applied: Aquacel Ag 5. Secondary Dressing Applied Bordered Foam Dressing Electronic Signature(s) Signed: 11/28/2016 2:26:31 PM By: Gretta Cool, BSN, RN, CWS, Kim RN, BSN Entered By: Gretta Cool, BSN, RN, CWS, Kim on 11/28/2016 14:26:31 Urie, [name] ([id number]) -------------------------------------------------------------------------------- Wound Assessment Details Patient Name: SIGMOND, PATALANO C. Date of Service: 11/27/2016 3:30 PM Medical Record Number: [id number] Patient Account Number: [id number] Date of Birth/Sex: 1950-05-28 (66 y.o. Male) Treating RN: Cornell Barman Primary Care Karsen Nakanishi: Otilio Miu Other Clinician: Referring Jaleiyah Alas: Otilio Miu Treating [place]/Extender: Ricard Dillon Weeks in Treatment: 46 Wound Status  Wound Number: 7 Primary Pressure Ulcer Etiology: Wound Location: Right Trochanter Wound Open Wounding Event: Pressure Injury Status: Date Acquired: 11/06/2016 Comorbid Coronary Artery Disease, Type II Weeks Of Treatment: 1 History: Diabetes, Quadriplegia, Seizure Clustered Wound: No Disorder Photos Wound Measurements Length: (cm) 0.6 Width: (cm) 1 Depth: (cm) 0.1 Area: (cm) 0.471 Volume: (cm) 0.047 % Reduction in Area: 50% % Reduction in Volume: 50% Epithelialization: Large  (67-100%) Tunneling: No Undermining: No Wound Description Classification: Unstageable/Unclassified Wound Margin: Flat and Intact Exudate Amount: Medium Exudate Type: Serosanguineous Exudate Color: red, brown Foul Odor After Cleansing: No Slough/Fibrino No Wound Bed Granulation Amount: Large (67-100%) Exposed Structure Granulation Quality: Red Fascia Exposed: No Necrotic Amount: Small (1-33%) Fat Layer (Subcutaneous Tissue) Exposed: Yes Necrotic Quality: Eschar Tendon Exposed: No Muscle Exposed: No Joint Exposed: No Bone Exposed: No Babineaux, Herrick C. ([id number]) Periwound Skin Texture Texture Color No Abnormalities Noted: No No Abnormalities Noted: No Callus: No Atrophie Blanche: No Crepitus: No Cyanosis: No Excoriation: No Ecchymosis: Yes Induration: No Erythema: No Rash: No Hemosiderin Staining: No Scarring: No Mottled: No Pallor: No Moisture Rubor: No No Abnormalities Noted: No Dry / Scaly: No Maceration: No Wound Preparation Ulcer Cleansing: Rinsed/Irrigated with Saline Topical Anesthetic Applied: None Treatment Notes Wound #7 (Right Trochanter) 1. Cleansed with: Clean wound with Normal Saline 2. Anesthetic Topical Lidocaine 4% cream to wound bed prior to debridement 3. Peri-wound Care: Skin Prep 4. Dressing Applied: Aquacel Ag 5. Secondary Dressing Applied Bordered Foam Dressing Electronic Signature(s) Signed: 11/28/2016 2:27:30 PM By: Gretta Cool, BSN, RN, CWS, Kim RN, BSN Entered By: Gretta Cool, BSN, RN, CWS, Kim on 11/28/2016 14:27:30 Pousson, [name] ([id number]) -------------------------------------------------------------------------------- Vitals Details Patient Name: TYJAI, MATUSZAK. Date of Service: 11/27/2016 3:30 PM Medical Record Number: [id number] Patient Account Number: [id number] Date of Birth/Sex: 26-Jan-1951 (66 y.o. Male) Treating RN: Cornell Barman Primary Care Kinslea Frances: Otilio Miu Other Clinician: Referring Vianey Caniglia: Otilio Miu Treating Declyn Offield/Extender: Ricard Dillon Weeks in Treatment: 80 Vital Signs Time Taken: 15:23 Temperature (F): 91.8 Height (in): 70 Pulse (bpm): 80 Source: Measured Respiratory Rate (breaths/min): 16 Weight (lbs): 187 Blood Pressure (mmHg): 86/58 Body Mass Index (BMI): 26.8 Reference Range: 80 - 120 mg / dl Notes MD notified of patient temp of 91.8 (ear temp) 94 registered after oral thermometer timed out. Electronic Signature(s) Signed: 11/27/2016 4:50:10 PM By: Gretta Cool, BSN, RN, CWS, Kim RN, BSN Entered By: Gretta Cool, BSN, RN, CWS, Kim on 11/27/2016 16:50:09

## 2016-11-29 NOTE — Progress Notes (Addendum)
[name], [name] ([id number]) Visit Report for 11/27/2016 HPI Details Patient Name: [name], [name]. Date of Service: 11/27/2016 3:30 PM Medical Record Number: [id number] Patient Account Number: [id number] Date of Birth/Sex: Apr 28, 1950 (66 y.o. 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 He has lower extremity sensation and can move his legs but has no/limited control over his arms. His wife accompanies him today and states that in the late spring or early summer of 2017 the patient became very depressed. He refused the refused to mobilize and he developed several pressure ulcers on his back. Most of these have healed however they have a recalcitrant area over the right scapula. They've been using Santyl on this for at least the last month. In terms of depression the patient is doing better now on an antidepressant. He saw his primary physician on 01/12/16 at which time there was apparently green drainage coming out of this area [Dr. Deana [name]]. Dr. Yetta [name] works in the [facility]. He has completed this Septra. Otherwise looking through [facility] notes that he has a history of seizures. He also had a stroke in 2008 he follows with neurology. He also has type 2 diabetes on Glucophage, hyperlipidemia and gastroesophageal reflux. He takes Plavix for stroke prevention and Keppra for seizure prophylaxis. 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 02/08/16; this is a patient with a pressure ulcer over the right scapula. His wife has been doing the dressing with Santyl and  border foam change every second day. When he arrived here 2 weeks ago we did a fairly extensive mechanical debridement. We are asked medical modalities to go out to the home and see what they might be eligible for in terms of pressure-relief surfaces [level 2] but the wife states that they have not heard from them. 03/20/15; this is a patient we haven't seen in 5-6 weeks. This was largely due to transportation issues. They've been using Santyl and border foam changing every second day for a pressure injury over the right scapula. The patient has a C3-C4 spinal injury. We had also asked for a review by the people who supplied DME to look at his wheelchair cushion, mattress etc. I don't know that this ever happened. In the meantime the wound has done remarkably well current measurements 1.8 x 2 x 0.1 04/03/16; the patient's dimensions have gone up to 3 cm in diameter quite a deterioration from last time. He is also complaining of pain in this area which is new. 04/10/16; 3 x 1.5 x 0.1. No difference from last week. Culture I did last week was negative he has completed antibiotics. 04/24/16 2.4 x 2 x 0.1; wound generally looks smaller. Middle area that I had to debrided last time looks healthier. His son is fashioned a large piece of foam cut out where the patient's wound with hit the back of his wheelchair 05/01/16; wound is a same size however the surface of this looks better. The middle innkeeper area required a repeat debridement 05/15/16; wound is down and dimensions granulation still looks healthy. The medial aspect of this wound is now the deeper Divot. We'll see  how this responds to further healing. We're using Hydrofera Blue 06/05/16; Wound 1.7x1.1x0.1 still using hyudrofera blue [name], [name] BienRICHARD C. ([id number]) 06/26/16; the patient arrives back in clinic after a three-week hiatus. He was hospitalized from 06/09/16 through 06/10/16. He was found to be confused. CT scan of the head showed nothing really  acute. His sodium was low at 131. He was rehydrated. He was felt to have a UTI and given antibiotics and antibiotics at discharge although his final culture result only showed multiple organisms. His wife says today that at the time of the hospitalization they discovered a large intact blister over the large aspect of his left calcaneus. This is recently ruptured. The wound on his right scapula is somewhat larger. More his wife is concerned about his current status. She states that he is still confused sleeps for long periods. He is not having fever chills cough or diarrhea [1 loose bowel movement per day]. He continues to have a suprapubic catheter. She notes that he is not eating and drinking well. Patient states he just does not want to eat. He does not feel nauseated or vomit. 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 Admission sodium was 131 at discharge 139 BUN 22 and 1.22 at admission, 17 and 1 at discharge. 07/03/16; patient's mental status is back to normal. He has a new wound on the right elbow caused by traumatizing his elbow against the wall apparently at the dermatologist office last week. We continue with the original wound on the right scapula and the wound from 2 weeks ago on his left heel. We have been using silver alginate to the area on the heel. 08/14/16; patient has not been here in almost 6 weeks. When he was here last time he had the pressure ulcer on the right scapula, atraumatic wound on the right lateral elbow and an area on his left heel. His wife states that at one point all of these were healed and she didn't really feel he needed to come back here. Since then the patient has developed a reopening of the area on the right scapula, a stage II wound on the left buttock and a reopening of the area on the right lateral elbow. As usual his wife as a litany of complaints against home  health, she is dismissing or is going to dismiss well care. She tells us she has a long list of supplies at home already for some reason they were not felt to be eligible for a group 2 or 3 surface although they have a hospital bed at home but no offloading surface 08/28/16; the patient arrived today unfortunately incontinent of stool in spite of this the wounds all appear to be better including the right scapular area, his left buttock's left heel and the right lateral elbow. 09/18/16; this is a patient who arrived today for follow-up of a pressure area over his right scapula area, left buttock and there was an area on his right lateral elbow. He arrived in clinic today with a worrisome area over the right initial tuberosity/right buttock. This had a necrotic surface with draining purulence. He has not been systemically unwell. The area over the left elbow healed. He arrived in clinic today with dressing that hadn't been changed over the scapula for 2 or 3 days per her intake. His wife is previously fired home health 09/25/16;; the last time the patient was here he arrived  with a new grossly infected wound over the right ischial tuberosity. Culture I did not show a specific pathogen. I did think this required admission to hospital and he was admitted from 09/18/16 through 09/20/16. Initially given vanc and Zosyn but then discharged with 5 days' worth of Augmentin. An MRI was done that did not show osteomyelitis of the right ischial tuberosity however it did show cellulitis and possible myositis. He also has wounds on the left ischial tuberosity and the area over the right scapular area. 10/04/16 on evaluation today patient appears to be doing better in regard to his back wound and is stable in regard to the gluteal wounds. There does not appear to be any evidence of significant infection at this point. He is tolerating the dressing changes currently. His wife has been performing the dressing  changes. Nonetheless he does have some discomfort especially in the gluteal areas although he did not specifically rate the discomfort there were times during evaluation where he flinched and it was obvious it was bothering him. No fevers, chills, nausea, or vomiting noted at this time. 10/17/16; the patient has bilateral ischial tuberosity wounds right greater than left. The left seems to be making good progress wears the right has not really changed that much and dimensions. There is also Dacus, Quentez C. ([id number]) some threatened area on the right side around the wound circumference. Equally worrisome there is a DTI over the lower sacral area however this is not open as of yet. His area over the left scapula which is the chronic wound he was coming to the clinic continues to make excellent progress 8/28 The patient comes in today wanting to talk about getting back in his wheelchair. He is very bored lying in bed at home 11/14/16; three-week hiatus for this patient for medical illnesses whether etc. He was in the emergency room at [facility] 2 days ago when his wife noted odor coming out of the right initial tuberosity wound and discoloration. I reviewed his ER presentation from 11/12/16. White count was 11.7 differential count reasonably normal basic metabolic panel was normal. Serum lactate was 1.9. He has a suprapubic catheter which showed positive urine nitrate large leukocytes and many bacteria. Urine cultures come back showing multiple species. Blood culture was negative. Wound culture is still pending. He was put on Keflex and Bactrim. The patient had an MRI of this area on 09/18/16 that did not show osteomyelitis however at that time there was possible cellulitis and infectious myositis no drainable fluid collection. After this he wishes admitted the hospital had vancomycin and Zosyn for 5 days and then 5 days of Augmentin 11/20/16; culture of the wound that I did last week showed  multiple organisms. This was the same as from the emergency room as it turns out. He is still on Keflex and Bactrim and completing this. I was really expecting something a little more ominous although he seems to have stabilized. We have been using silver alginate to the major wound on the right ischial tuberosity. He had a DTI over the right greater trochanter, superficial wound over the left ischial tuberosity and a DTI on the superior part of the left hemipelvis. FORTUNATELY everything looks a little better here than last week. At my suggestion his wife looked into [facility] but was told that he needed a 3 day acute hospital stay. This would be similar to what is required for skilled nursing and I was not specifically aware of this although it may have  something to do with the patient's specific insurance 11/27/16; he has completed his antibiotics. All of his wounds appeared to have stabilized. This includes the necrotic right ischial tuberosity wound, DTI on the superior pelvis, DTI over the left greater trochanter. We have been using Aquacel Ag to all wound areas. He arrives in clinic today with a low temperature at first not registering at all then 91.5. After serial checks with different instruments we got this up to 94 although the patient looks fine is mentating normally. His wife reports that this is often the pattern when he goes to the hospital and that they'll put him in a heating blanket and send him home although I don't really see evidence of this on the most recent ER trips. She states she has a warming blanket and a rectal for mom under the registers down to 91. I've asked her to check his temperature when she goes home. They both seemed very reluctant to go to the emergency department over this and to be truthful he looks stable enough to avoid this at least for now Electronic Signature(s) Signed: 11/28/2016 8:40:51 AM By: Baltazar Najjar MD Entered By: Baltazar Najjar on 11/27/2016 16:37:29 [name], [name] ([id number]) -------------------------------------------------------------------------------- Physical Exam Details Patient Name: Tierno, Levern C. Date of Service: 11/27/2016 3:30 PM Medical Record Number: [id number] Patient Account Number: [id number] Date of Birth/Sex: 1950/08/03 (66 y.o. Male) Treating RN: [name] Primary Care Provider: Elizabeth [name] Other Clinician: Referring Provider: Elizabeth [name] Treating Provider/Extender: [name] Weeks in Treatment: 46 Constitutional Patient is hypotensive. This is not unusual for him with his C-spine injury. Pulse regular and within target range for patient.Marland Kitchen Respirations regular, non-labored and within target range.. Agent is hypothermic by definition less than 95. Repeated at 94. Alert responsive. Eyes Conjunctivae clear. No discharge. Respiratory Respiratory effort is easy and symmetric bilaterally. Rate is normal at rest and on room air.. Bilateral breath sounds are clear and equal in all lobes with no wheezes, rales or rhonchi.. Cardiovascular Heart rhythm and rate regular, without murmur or gallop. Appears well hydrated. Gastrointestinal (GI) Abdomen is soft and non-distended without masses or tenderness. Bowel sounds active in all quadrants.. No liver or spleen enlargement or tenderness.. Notes Wound exam; the patient looks actually better today. Await conversational he does not have any specific complaints. He is not in overt respiratory distress. oThe original worrisome pressure area over the right initial tuberosity actually looks somewhat better. There is some necrotic material that I removed with pickups and scissors however the base of the wound looks better. There is no evidence of surrounding soft tissue infection. oThe DTI over the right greater tuberosity and the left hemipelvis also look better. Electronic Signature(s) Signed: 11/28/2016 8:40:51 AM By: Baltazar Najjar MD Entered By: Baltazar Najjar on 11/27/2016 16:50:32 [name], [name] ([id number]) -------------------------------------------------------------------------------- Physician Orders Details Patient Name: LES, LONGMORE C. Date of Service: 11/27/2016 3:30 PM Medical Record Number: [id number] Patient Account Number: [id number] Date of Birth/Sex: 12/18/1950 (66 y.o. Male) Treating RN: [name] Primary Care Provider: Elizabeth [name] Other Clinician: Referring Provider: Elizabeth [name] Treating Provider/Extender: Altamese [place] in Treatment: 62 Verbal / Phone Orders: No Diagnosis Coding ICD-10 Coding Code Description [phone] Pressure ulcer of other site, stage 3 S14.103S Unspecified injury at C3 level of cervical spinal cord, sequela S51.011A Laceration without foreign body of right elbow, initial encounter L89.322 Pressure ulcer of left buttock, stage 2 L89.310 Pressure ulcer of right buttock, unstageable L03.317 Cellulitis of buttock Wound Cleansing Wound #  1 Right,Proximal Back o Clean wound with Normal Saline. o Cleanse wound with mild soap and water Anesthetic Wound #1 Right,Proximal Back o Topical Lidocaine 4% cream applied to wound bed prior to debridement Wound #4 Left Gluteus o Topical Lidocaine 4% cream applied to wound bed prior to debridement Wound #5 Right Gluteus o Topical Lidocaine 4% cream applied to wound bed prior to debridement Wound #6 Medial Sacrum o Topical Lidocaine 4% cream applied to wound bed prior to debridement Wound #7 Right Trochanter o Topical Lidocaine 4% cream applied to wound bed prior to debridement Skin Barriers/Peri-Wound Care Wound #1 Right,Proximal Back o Skin Prep Wound #4 Left Gluteus o Skin Prep Marse, Machi C. ([id number]) Wound #5 Right Gluteus o Skin Prep Wound #6 Medial Sacrum o Skin Prep Wound #7 Right Trochanter o Skin Prep Primary Wound Dressing Wound #1 Right,Proximal Back o  Aquacel Ag Wound #4 Left Gluteus o Aquacel Ag Wound #5 Right Gluteus o Aquacel Ag Wound #6 Medial Sacrum o Aquacel Ag Wound #7 Right Trochanter o Aquacel Ag Secondary Dressing Wound #1 Right,Proximal Back o Boardered Foam Dressing Wound #4 Left Gluteus o Boardered Foam Dressing Wound #5 Right Gluteus o Boardered Foam Dressing Wound #6 Medial Sacrum o Boardered Foam Dressing Wound #7 Right Trochanter o Boardered Foam Dressing Dressing Change Frequency Wound #1 Right,Proximal Back o Change dressing every other day. Wound #4 Left Gluteus o Change dressing every other day. Apachito, Lemont C. ([id number]) Wound #5 Right Gluteus o Change dressing every other day. Wound #6 Medial Sacrum o Change dressing every other day. Wound #7 Right Trochanter o Change dressing every other day. Follow-up Appointments Wound #1 Right,Proximal Back o Return Appointment in 1 week. Wound #4 Left Gluteus o Return Appointment in 1 week. Wound #5 Right Gluteus o Return Appointment in 1 week. Wound #6 Medial Sacrum o Return Appointment in 1 week. Wound #7 Right Trochanter o Return Appointment in 1 week. Off-Loading Wound #1 Right,Proximal Back o Roho cushion for wheelchair o Turn and reposition every 2 hours Wound #4 Left Gluteus o Roho cushion for wheelchair o Turn and reposition every 2 hours Wound #5 Right Gluteus o Roho cushion for wheelchair o Turn and reposition every 2 hours Wound #6 Medial Sacrum o Roho cushion for wheelchair o Turn and reposition every 2 hours Wound #7 Right Trochanter o Roho cushion for wheelchair o Turn and reposition every 2 hours Additional Orders / Instructions VIR, WHETSTINE. ([id number]) Wound #1 Right,Proximal Back o Increase protein intake. Wound #4 Left Gluteus o Increase protein intake. Wound #5 Right Gluteus o Increase protein intake. Wound #6 Medial Sacrum o Increase protein  intake. Wound #7 Right Trochanter o Increase protein intake. Medications-please add to medication list. Wound #1 Right,Proximal Back o Other: - Vitamins A, C and Zinc Wound #4 Left Gluteus o Other: - Vitamins A, C and Zinc Wound #5 Right Gluteus o Other: - Vitamins A, C and Zinc Wound #6 Medial Sacrum o Other: - Vitamins A, C and Zinc Wound #7 Right Trochanter o Other: - Vitamins A, C and Zinc Electronic Signature(s) Signed: 11/28/2016 11:53:57 AM By: Elliot Gurney, BSN, RN, CWS, Kim RN, BSN Signed: 11/28/2016 4:23:55 PM By: Baltazar Najjar MD Entered By: Elliot Gurney, BSN, RN, CWS, Kim on 11/28/2016 11:53:56 [name], [name] ([id number]) -------------------------------------------------------------------------------- Problem List Details Patient Name: SOULEYMANE, SAIKI C. Date of Service: 11/27/2016 3:30 PM Medical Record Number: [id number] Patient Account Number: [id number] Date of Birth/Sex: 06-09-1950 (66 y.o. Male) Treating RN: [name] Primary Care Provider: Elizabeth [name]  Other Clinician: Referring Provider: Elizabeth [name] Treating Provider/Extender: Altamese [place] in Treatment: 44 Active Problems ICD-10 Encounter Code Description Active Date Diagnosis 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, stage 3 01/24/2016 Yes S14.103S Unspecified injury at C3 level of cervical spinal cord, 01/24/2016 Yes sequela S51.011A Laceration without foreign body of right elbow, initial 07/03/2016 Yes encounter L89.322 Pressure ulcer of left buttock, stage 2 08/14/2016 Yes L89.310 Pressure ulcer of right buttock, unstageable 09/18/2016 Yes L03.317 Cellulitis of buttock 09/18/2016 Yes Inactive Problems Resolved Problems ICD-10 Code Description Active Date Resolved Date L89.622 Pressure ulcer of left heel, stage 2 06/26/2016 06/26/2016 Electronic Signature(s) Signed: 11/28/2016 8:40:51 AM By: Baltazar Najjar MD Entered By: Baltazar Najjar on 11/27/2016 16:32:44 [name], [name]  ([id number]) [name], [name] ([id number]) -------------------------------------------------------------------------------- Progress Note Details Patient Name: [name] Bras C. Date of Service: 11/27/2016 3:30 PM Medical Record Number: [id number] Patient Account Number: [id number] Date of Birth/Sex: August 06, 1950 (66 y.o. 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 He has lower extremity sensation and can move his legs but has no/limited control over his arms. His wife accompanies him today and states that in the late spring or early summer of 2017 the patient became very depressed. He refused the refused to mobilize and he developed several pressure ulcers on his back. Most of these have healed however they have a recalcitrant area over the right scapula. They've been using Santyl on this for at least the last month. In terms of depression the patient is doing better now on an antidepressant. He saw his primary physician on 01/12/16 at which time there was apparently green drainage coming out of this area [Dr. Deana [name]]. Dr. Yetta [name] works in the [facility]. He has completed this Septra. Otherwise looking through [facility] notes that he has a history of seizures. He also had a stroke in 2008 he follows with neurology. He also has type 2 diabetes on Glucophage, hyperlipidemia and gastroesophageal reflux. He takes Plavix for stroke prevention and Keppra for seizure prophylaxis. 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 02/08/16; this is a patient with a pressure ulcer  over the right scapula. His wife has been doing the dressing with Santyl and border foam change every second day. When he arrived here 2 weeks ago we did a fairly extensive mechanical debridement. We are asked medical modalities to go out to the home and see what they might be eligible for in terms of pressure-relief surfaces [level 2] but the wife states that they have not heard from them. 03/20/15; this is a patient we haven't seen in 5-6 weeks. This was largely due to transportation issues. They've been using Santyl and border foam changing every second day for a pressure injury over the right scapula. The patient has a C3-C4 spinal injury. We had also asked for a review by the people who supplied DME to look at his wheelchair cushion, mattress etc. I don't know that this ever happened. In the meantime the wound has done remarkably well current measurements 1.8 x 2 x 0.1 04/03/16; the patient's dimensions have gone up to 3 cm in diameter quite a  deterioration from last time. He is also complaining of pain in this area which is new. 04/10/16; 3 x 1.5 x 0.1. No difference from last week. Culture I did last week was negative he has completed antibiotics. 04/24/16 2.4 x 2 x 0.1; wound generally looks smaller. Middle area that I had to debrided last time looks healthier. His son is fashioned a large piece of foam cut out where the patient's wound with hit the back of his wheelchair 05/01/16; wound is a same size however the surface of this looks better. The middle innkeeper area required a repeat debridement 05/15/16; wound is down and dimensions granulation still looks healthy. The medial aspect of this wound is now the deeper Divot. We'll see how this responds to further healing. We're using Hydrofera Blue 06/05/16; Wound 1.7x1.1x0.1 still using hyudrofera blue 06/26/16; the patient arrives back in clinic after a three-week hiatus. He was hospitalized from 06/09/16 through [facility]  ([id number]) 06/10/16. He was found to be confused. CT scan of the head showed nothing really acute. His sodium was low at 131. He was rehydrated. He was felt to have a UTI and given antibiotics and antibiotics at discharge although his final culture result only showed multiple organisms. His wife says today that at the time of the hospitalization they discovered a large intact blister over the large aspect of his left calcaneus. This is recently ruptured. The wound on his right scapula is somewhat larger. More his wife is concerned about his current status. She states that he is still confused sleeps for long periods. He is not having fever chills cough or diarrhea [1 loose bowel movement per day]. He continues to have a suprapubic catheter. She notes that he is not eating and drinking well. Patient states he just does not want to eat. He does not feel nauseated or vomit. 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 Admission sodium was 131 at discharge 139 BUN 22 and 1.22 at admission, 17 and 1 at discharge. 07/03/16; patient's mental status is back to normal. He has a new wound on the right elbow caused by traumatizing his elbow against the wall apparently at the dermatologist office last week. We continue with the original wound on the right scapula and the wound from 2 weeks ago on his left heel. We have been using silver alginate to the area on the heel. 08/14/16; patient has not been here in almost 6 weeks. When he was here last time he had the pressure ulcer on the right scapula, atraumatic wound on the right lateral elbow and an area on his left heel. His wife states that at one point all of these were healed and she didn't really feel he needed to come back here. Since then the patient has developed a reopening of the area on the right scapula, a stage II wound on the left buttock and a reopening of the area  on the right lateral elbow. As usual his wife as a litany of complaints against home health, she is dismissing or is going to dismiss well care. She tells [place] she has a long list of supplies at home already for some reason they were not felt to be eligible for a group 2 or 3 surface although they have a hospital bed at home but no offloading surface 08/28/16; the patient arrived today unfortunately incontinent of stool in spite of this the wounds all  appear to be better including the right scapular area, his left buttock's left heel and the right lateral elbow. 09/18/16; this is a patient who arrived today for follow-up of a pressure area over his right scapula area, left buttock and there was an area on his right lateral elbow. He arrived in clinic today with a worrisome area over the right initial tuberosity/right buttock. This had a necrotic surface with draining purulence. He has not been systemically unwell. The area over the left elbow healed. He arrived in clinic today with dressing that hadn't been changed over the scapula for 2 or 3 days per her intake. His wife is previously fired home health 09/25/16;; the last time the patient was here he arrived with a new grossly infected wound over the right ischial tuberosity. Culture I did not show a specific pathogen. I did think this required admission to hospital and he was admitted from 09/18/16 through 09/20/16. Initially given vanc and Zosyn but then discharged with 5 days' worth of Augmentin. An MRI was done that did not show osteomyelitis of the right ischial tuberosity however it did show cellulitis and possible myositis. He also has wounds on the left ischial tuberosity and the area over the right scapular area. 10/04/16 on evaluation today patient appears to be doing better in regard to his back wound and is stable in regard to the gluteal wounds. There does not appear to be any evidence of significant infection at this point. He is tolerating  the dressing changes currently. His wife has been performing the dressing changes. Nonetheless he does have some discomfort especially in the gluteal areas although he did not specifically rate the discomfort there were times during evaluation where he flinched and it was obvious it was bothering him. No fevers, chills, nausea, or vomiting noted at this time. 10/17/16; the patient has bilateral ischial tuberosity wounds right greater than left. The left seems to be making good progress wears the right has not really changed that much and dimensions. There is also some threatened area on the right side around the wound circumference. Equally worrisome there is a DTI over the lower sacral area however this is not open as of yet. His area over the left scapula which is the [facility]. ([id number]) chronic wound he was coming to the clinic continues to make excellent progress 8/28 The patient comes in today wanting to talk about getting back in his wheelchair. He is very bored lying in bed at home 11/14/16; three-week hiatus for this patient for medical illnesses whether etc. He was in the emergency room at [place] 2 days ago when his wife noted odor coming out of the right initial tuberosity wound and discoloration. I reviewed his ER presentation from 11/12/16. White count was 11.7 differential count reasonably normal basic metabolic panel was normal. Serum lactate was 1.9. He has a suprapubic catheter which showed positive urine nitrate large leukocytes and many bacteria. Urine cultures come back showing multiple species. Blood culture was negative. Wound culture is still pending. He was put on Keflex and Bactrim. The patient had an MRI of this area on 09/18/16 that did not show osteomyelitis however at that time there was possible cellulitis and infectious myositis no drainable fluid collection. After this he wishes admitted the hospital had vancomycin and Zosyn for 5 days and then 5 days of  Augmentin 11/20/16; culture of the wound that I did last week showed multiple organisms. This was the same as from the emergency room as  it turns out. He is still on Keflex and Bactrim and completing this. I was really expecting something a little more ominous although he seems to have stabilized. We have been using silver alginate to the major wound on the right ischial tuberosity. He had a DTI over the right greater trochanter, superficial wound over the left ischial tuberosity and a DTI on the superior part of the left hemipelvis. FORTUNATELY everything looks a little better here than last week. At my suggestion his [facility] but was told that he needed a 3 day acute hospital stay. This would be similar to what is required for skilled nursing and I was not specifically aware of this although it may have something to do with the patient's specific insurance 11/27/16; he has completed his antibiotics. All of his wounds appeared to have stabilized. This includes the necrotic right ischial tuberosity wound, DTI on the superior pelvis, DTI over the left greater trochanter. We have been using Aquacel Ag to all wound areas. He arrives in clinic today with a low temperature at first not registering at all then 91.5. After serial checks with different instruments we got this up to 94 although the patient looks fine is mentating normally. His wife reports that this is often the pattern when he goes to the hospital and that they'll put him in a heating blanket and send him home although I don't really see evidence of this on the most recent ER trips. She states she has a warming blanket and a rectal for mom under the registers down to 91. I've asked her to check his temperature when she goes home. They both seemed very reluctant to go to the emergency department over this and to be truthful he looks stable enough to avoid this at least for  now Objective Constitutional Patient is hypotensive. This is not unusual for him with his C-spine injury. Pulse regular and within target range for patient.Marland Kitchen Respirations regular, non-labored and within target range.. Agent is hypothermic by definition less than 95. Repeated at 94. Alert responsive. [name], [name] ([id number]) Vitals Time Taken: 3:23 PM, Height: 70 in, Source: Measured, Weight: 187 lbs, BMI: 26.8, Temperature: 91.8 F, Pulse: 80 bpm, Respiratory Rate: 16 breaths/min, Blood Pressure: 86/58 mmHg. General Notes: MD notified of patient temp of 91.8 (ear temp) 94 registered after oral thermometer timed out. Eyes Conjunctivae clear. No discharge. Respiratory Respiratory effort is easy and symmetric bilaterally. Rate is normal at rest and on room air.. Bilateral breath sounds are clear and equal in all lobes with no wheezes, rales or rhonchi.. Cardiovascular Heart rhythm and rate regular, without murmur or gallop. Appears well hydrated. Gastrointestinal (GI) Abdomen is soft and non-distended without masses or tenderness. Bowel sounds active in all quadrants.. No liver or spleen enlargement or tenderness.. General Notes: Wound exam; the patient looks actually better today. Await conversational he does not have any specific complaints. He is not in overt respiratory distress. The original worrisome pressure area over the right initial tuberosity actually looks somewhat better. There is some necrotic material that I removed with pickups and scissors however the base of the wound looks better. There is no evidence of surrounding soft tissue infection. The DTI over the right greater tuberosity and the left hemipelvis also look better. Integumentary (Hair, Skin) Wound #1 status is Open. Original cause of wound was Pressure Injury. The wound is located on the Right,Proximal Back. The wound measures 0.9cm length x 1cm width x 0.1cm depth; 0.707cm^2  area and 0.071cm^3 volume. There  is Fat Layer (Subcutaneous Tissue) Exposed exposed. There is no tunneling or undermining noted. There is a medium amount of serous drainage noted. The wound margin is flat and intact. There is large (67-100%) red granulation within the wound bed. There is a small (1-33%) amount of necrotic tissue within the wound bed including Adherent Slough. The periwound skin appearance exhibited: Excoriation, Scarring. The periwound skin appearance did not exhibit: Callus, Crepitus, Induration, Rash, Dry/Scaly, Maceration, Atrophie Blanche, Cyanosis, Ecchymosis, Hemosiderin Staining, Mottled, Pallor, Rubor, Erythema. Periwound temperature was noted as No Abnormality. The periwound has tenderness on palpation. Wound #4 status is Open. Original cause of wound was Pressure Injury. The wound is located on the Left Gluteus. The wound measures 3cm length x 2.5cm width x 0.1cm depth; 5.89cm^2 area and 0.589cm^3 volume. There is Fat Layer (Subcutaneous Tissue) Exposed exposed. There is no tunneling or undermining noted. There is a large amount of serosanguineous drainage noted. The wound margin is flat and intact. There is medium (34-66%) red granulation within the wound bed. There is a medium (34-66%) amount of necrotic tissue within the wound bed including Adherent Slough. The periwound skin appearance exhibited: Scarring, Erythema. The periwound skin appearance did not exhibit: Callus, Crepitus, Excoriation, Induration, Rash, Dry/Scaly, Maceration, Atrophie Blanche, Cyanosis, Ecchymosis, Hemosiderin Staining, Mottled, Pallor, Rubor. The surrounding wound skin color is noted with erythema which is circumferential. Wound #5 status is Open. Original cause of wound was Pressure Injury. The wound is located on the Right Gluteus. The wound measures 4.1cm length x 4cm width x 1.1cm depth; 12.881cm^2 area and 14.169cm^3 [name], [name] ([id number]) volume. There is muscle and Fat Layer (Subcutaneous Tissue) Exposed  exposed. There is undermining starting at 4:00 and ending at 10:00 with a maximum distance of 1.2cm. There is a large amount of serous drainage noted. The wound margin is flat and intact. There is small (1-33%) red granulation within the wound bed. There is a large (67-100%) amount of necrotic tissue within the wound bed including Adherent Slough. The periwound skin appearance exhibited: Erythema. The surrounding wound skin color is noted with erythema which is circumferential. Periwound temperature was noted as No Abnormality. The periwound has tenderness on palpation. Wound #6 status is Open. Original cause of wound was Pressure Injury. The wound is located on the Medial Sacrum. The wound measures 4cm length x 8cm width x 0.1cm depth; 25.133cm^2 area and 2.513cm^3 volume. There is Fat Layer (Subcutaneous Tissue) Exposed exposed. There is no tunneling or undermining noted. There is a large amount of serous drainage noted. The wound margin is indistinct and nonvisible. There is medium (34-66%) red granulation within the wound bed. There is a medium (34-66%) amount of necrotic tissue within the wound bed including Adherent Slough. The periwound skin appearance exhibited: Scarring, Ecchymosis. The periwound skin appearance did not exhibit: Callus, Crepitus, Excoriation, Induration, Rash, Dry/Scaly, Maceration, Atrophie Blanche, Cyanosis, Hemosiderin Staining, Mottled, Pallor, Rubor, Erythema. Periwound temperature was noted as No Abnormality. The periwound has tenderness on palpation. Wound #7 status is Open. Original cause of wound was Pressure Injury. The wound is located on the Right Trochanter. The wound measures 0.6cm length x 1cm width x 0.1cm depth; 0.471cm^2 area and 0.047cm^3 volume. There is Fat Layer (Subcutaneous Tissue) Exposed exposed. There is no tunneling or undermining noted. There is a medium amount of serosanguineous drainage noted. The wound margin is flat and intact. There is  large (67-100%) red granulation within the wound bed. There is a small (1-33%) amount  of necrotic tissue within the wound bed including Eschar. The periwound skin appearance exhibited: Ecchymosis. The periwound skin appearance did not exhibit: Callus, Crepitus, Excoriation, Induration, Rash, Scarring, Dry/Scaly, Maceration, Atrophie Blanche, Cyanosis, Hemosiderin Staining, Mottled, Pallor, Rubor, Erythema. 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 ([id number]) Wound Cleansing: Wound #1 Right,Proximal Back: Clean wound with Normal Saline. Cleanse wound with mild soap and water Anesthetic: Wound #1 Right,Proximal Back: Topical Lidocaine 4% cream applied to wound bed prior to debridement Wound #4 Left Gluteus: Topical Lidocaine 4% cream applied to wound bed prior to debridement Wound #5 Right Gluteus: Topical Lidocaine 4% cream applied to wound bed prior to debridement Wound #6 Medial Sacrum: Topical Lidocaine 4% cream applied to wound bed prior to debridement Wound #7 Right Trochanter: Topical Lidocaine 4% cream applied to wound bed prior to debridement Skin Barriers/Peri-Wound Care: Wound #1 Right,Proximal Back: Skin Prep Wound #4 Left Gluteus: Skin Prep Wound #5 Right Gluteus: Skin Prep Wound #6 Medial Sacrum: Skin Prep Wound #7 Right Trochanter: Skin Prep Primary Wound Dressing: Wound #1 Right,Proximal Back: Aquacel Ag Wound #4 Left Gluteus: Aquacel Ag Wound #5 Right Gluteus: Aquacel Ag Wound #6 Medial Sacrum: Aquacel Ag Wound #7 Right Trochanter: Aquacel Ag Secondary Dressing: Wound #1 Right,Proximal Back: Boardered  Foam Dressing Wound #4 Left Gluteus: Boardered Foam Dressing Wound #5 Right Gluteus: Boardered Foam Dressing Wound #6 Medial Sacrum: Boardered Foam Dressing Wound #7 Right Trochanter: Boardered Foam Dressing Dressing Change Frequency: Wound #1 Right,Proximal Back: [name], Nyko C. ([id number]) Change dressing every other day. Wound #4 Left Gluteus: Change dressing every other day. Wound #5 Right Gluteus: Change dressing every other day. Wound #6 Medial Sacrum: Change dressing every other day. Wound #7 Right Trochanter: Change dressing every other day. Follow-up Appointments: Wound #1 Right,Proximal Back: Return Appointment in 1 week. Wound #4 Left Gluteus: Return Appointment in 1 week. Wound #5 Right Gluteus: Return Appointment in 1 week. Wound #6 Medial Sacrum: Return Appointment in 1 week. Wound #7 Right Trochanter: Return Appointment in 1 week. Off-Loading: Wound #1 Right,Proximal Back: Roho cushion for wheelchair Turn and reposition every 2 hours Wound #4 Left Gluteus: Roho cushion for wheelchair Turn and reposition every 2 hours Wound #5 Right Gluteus: Roho cushion for wheelchair Turn and reposition every 2 hours Wound #6 Medial Sacrum: Roho cushion for wheelchair Turn and reposition every 2 hours Wound #7 Right Trochanter: Roho cushion for wheelchair Turn and reposition every 2 hours Additional Orders / Instructions: Wound #1 Right,Proximal Back: Increase protein intake. Wound #4 Left Gluteus: Increase protein intake. Wound #5 Right Gluteus: Increase protein intake. Wound #6 Medial Sacrum: Increase protein intake. Wound #7 Right Trochanter: Increase protein intake. Medications-please add to medication list.: Wound #1 Right,Proximal Back: Other: - Vitamins A, C and Zinc Wound #4 Left Gluteus: Whittley, Marlen C. ([id number]) Other: - Vitamins A, C and Zinc Wound #5 Right Gluteus: Other: - Vitamins A, C and Zinc Wound #6 Medial Sacrum: Other: -  Vitamins A, C and Zinc Wound #7 Right Trochanter: Other: - Vitamins A, C and Zinc o #1 continuing silver alginate all wound areas. #2 the mild hypothermia I found somewhat concerning in a man with cervical spine injury. Although his wife  states that they've had this issue before in the ER I could not actually find an example of it in his recent ER trips. She states she has a rectal or temperature thermometer, these are unusual but I have asked her to check his temperature when he goes home and call us in the morning on this. He looked very stable #3 all of the wound areas look somewhat better Electronic Signature(s) Signed: 12/10/2016 1:48:55 PM By: Elliot Gurney, BSN, RN, CWS, Kim RN, BSN Signed: 12/12/2016 5:51:56 PM By: Baltazar Najjar MD Previous Signature: 11/27/2016 4:51:01 PM Version By: Baltazar Najjar MD Entered By: Elliot Gurney, BSN, RN, CWS, Kim on 12/10/2016 13:48:55 Wassmer, [name] ([id number]) -------------------------------------------------------------------------------- SuperBill Details Patient Name: VALDEMAR, MCCLENAHAN. Date of Service: 11/27/2016 Medical Record Number: [id number] Patient Account Number: [id number] Date of Birth/Sex: 11/06/1950 (66 y.o. Male) Treating RN: [name] Primary Care Provider: Elizabeth [name] Other Clinician: Referring Provider: Elizabeth [name] Treating Provider/Extender: [name] Weeks in Treatment: 15 Diagnosis Coding ICD-10 Codes Code Description [id number] Pressure ulcer of other site, stage 3 S14.103S Unspecified injury at C3 level of cervical spinal cord, sequela S51.011A Laceration without foreign body of right elbow, initial encounter L89.322 Pressure ulcer of left buttock, stage 2 L89.310 Pressure ulcer of right buttock, unstageable L03.317 Cellulitis of buttock Facility Procedures CPT4 Code: [id number] Description: 84696 - WOUND CARE VISIT-LEV 5 EST PT Modifier: Quantity: 1 Physician Procedures CPT4 Code: [id number] Description: 99213 - WC  PHYS LEVEL 3 - EST PT ICD-10 Description Diagnosis 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, stage 3 L89.310 Pressure ulcer of right buttock, unstageable Modifier: Quantity: 1 Electronic Signature(s) Signed: 12/05/2016 4:12:03 PM By: Baltazar Najjar MD Previous Signature: 11/28/2016 8:40:51 AM Version By: Baltazar Najjar MD Entered By: Francie Massing on 12/03/2016 10:08:49

## 2016-12-04 ENCOUNTER — Encounter: Payer: Medicare Other | Admitting: Internal Medicine

## 2016-12-04 DIAGNOSIS — Z8673 Personal history of transient ischemic attack (TIA), and cerebral infarction without residual deficits: Secondary | ICD-10-CM | POA: Diagnosis not present

## 2016-12-04 DIAGNOSIS — I959 Hypotension, unspecified: Secondary | ICD-10-CM | POA: Diagnosis not present

## 2016-12-04 DIAGNOSIS — L89109 Pressure ulcer of unspecified part of back, unspecified stage: Secondary | ICD-10-CM | POA: Diagnosis not present

## 2016-12-04 DIAGNOSIS — R569 Unspecified convulsions: Secondary | ICD-10-CM | POA: Diagnosis not present

## 2016-12-04 DIAGNOSIS — S14103S Unspecified injury at C3 level of cervical spinal cord, sequela: Secondary | ICD-10-CM | POA: Diagnosis not present

## 2016-12-04 DIAGNOSIS — E119 Type 2 diabetes mellitus without complications: Secondary | ICD-10-CM | POA: Diagnosis not present

## 2016-12-04 DIAGNOSIS — K219 Gastro-esophageal reflux disease without esophagitis: Secondary | ICD-10-CM | POA: Diagnosis not present

## 2016-12-04 DIAGNOSIS — S51011A Laceration without foreign body of right elbow, initial encounter: Secondary | ICD-10-CM | POA: Diagnosis not present

## 2016-12-04 DIAGNOSIS — L89219 Pressure ulcer of right hip, unspecified stage: Secondary | ICD-10-CM | POA: Diagnosis not present

## 2016-12-04 DIAGNOSIS — L89319 Pressure ulcer of right buttock, unspecified stage: Secondary | ICD-10-CM | POA: Diagnosis not present

## 2016-12-04 DIAGNOSIS — L89310 Pressure ulcer of right buttock, unstageable: Secondary | ICD-10-CM | POA: Diagnosis not present

## 2016-12-04 DIAGNOSIS — Z7984 Long term (current) use of oral hypoglycemic drugs: Secondary | ICD-10-CM | POA: Diagnosis not present

## 2016-12-04 DIAGNOSIS — L89159 Pressure ulcer of sacral region, unspecified stage: Secondary | ICD-10-CM | POA: Diagnosis not present

## 2016-12-04 DIAGNOSIS — L89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, stage 3: Secondary | ICD-10-CM | POA: Diagnosis not present

## 2016-12-04 DIAGNOSIS — L03317 Cellulitis of buttock: Secondary | ICD-10-CM | POA: Diagnosis not present

## 2016-12-04 DIAGNOSIS — L89322 Pressure ulcer of left buttock, stage 2: Secondary | ICD-10-CM | POA: Diagnosis not present

## 2016-12-04 DIAGNOSIS — G8252 Quadriplegia, C1-C4 incomplete: Secondary | ICD-10-CM | POA: Diagnosis not present

## 2016-12-05 NOTE — Progress Notes (Signed)
[name], [name] ([id number]) Visit Report for 12/04/2016 Arrival Information Details Patient Name: [name], [name]. Date of Service: 12/04/2016 2:45 PM Medical Record Number: [id number] Patient Account Number: [id number] Date of Birth/Sex: [birth year] (66 y.o. Male) Treating RN: Cornell Barman Primary Care Tonyia Marschall: Otilio Miu Other Clinician: Referring Edessa Jakubowicz: Otilio Miu Treating Ricki Clack/Extender: Tito Dine in Treatment: 75 Visit Information History Since Last Visit Added or deleted any medications: No Patient Arrived: Wheel Chair Any new allergies or adverse reactions: No Arrival Time: 14:44 Had a fall or experienced change in No activities of daily living that may affect Accompanied By: wife risk of falls: Transfer Assistance: Civil Service fast streamer Signs or symptoms of abuse/neglect since last No Patient Identification Verified: Yes visito Secondary Verification Process Yes Hospitalized since last visit: No Completed: Has Dressing in Place as Prescribed: Yes Patient Requires Transmission-Based No Pain Present Now: No Precautions: Patient Has Alerts: Yes Electronic Signature(s) Signed: 12/04/2016 5:44:12 PM By: Gretta Cool, BSN, RN, CWS, Kim RN, BSN Entered By: Gretta Cool, BSN, RN, CWS, Kim on 12/04/2016 14:44:40 [place], Leslye Peer ([id number]) -------------------------------------------------------------------------------- Clinic Level of Care Assessment Details Patient Name: [name], [name] Date of Service: 12/04/2016 2:45 PM Medical Record Number: [id number] Patient Account Number: [id number] Date of Birth/Sex: Mar 18, 1950 (66 y.o. Male) Treating RN: Cornell Barman Primary Care Simrah Chatham: Otilio Miu Other Clinician: Referring Gerhardt Gleed: Otilio Miu Treating Shela Esses/Extender: Tito Dine in Treatment: 60 Clinic Level of Care Assessment Items TOOL 4 Quantity Score '[]'$  - Use when only an EandM is performed on FOLLOW-UP visit 0 ASSESSMENTS - Nursing Assessment /  Reassessment '[]'$  - Reassessment of Co-morbidities (includes updates in patient status) 0 X - Reassessment of Adherence to Treatment Plan 1 5 ASSESSMENTS - Wound and Skin Assessment / Reassessment '[]'$  - Simple Wound Assessment / Reassessment - one wound 0 X - Complex Wound Assessment / Reassessment - multiple wounds 5 5 '[]'$  - Dermatologic / Skin Assessment (not related to wound area) 0 ASSESSMENTS - Focused Assessment '[]'$  - Circumferential Edema Measurements - multi extremities 0 '[]'$  - Nutritional Assessment / Counseling / Intervention 0 '[]'$  - Lower Extremity Assessment (monofilament, tuning fork, pulses) 0 '[]'$  - Peripheral Arterial Disease Assessment (using hand held doppler) 0 ASSESSMENTS - Ostomy and/or Continence Assessment and Care '[]'$  - Incontinence Assessment and Management 0 '[]'$  - Ostomy Care Assessment and Management (repouching, etc.) 0 PROCESS - Coordination of Care X - Simple Patient / Family Education for ongoing care 1 15 '[]'$  - Complex (extensive) Patient / Family Education for ongoing care 0 '[]'$  - Staff obtains Programmer, systems, Records, Test Results / Process Orders 0 '[]'$  - Staff telephones HHA, Nursing Homes / Clarify orders / etc 0 '[]'$  - Routine Transfer to another Facility (non-emergent condition) 0 Trawick, OSWALD POTT ([id number]) '[]'$  - Routine Hospital Admission (non-emergent condition) 0 '[]'$  - New Admissions / Biomedical engineer / Ordering NPWT, Apligraf, etc. 0 '[]'$  - Emergency Hospital Admission (emergent condition) 0 X - Simple Discharge Coordination 1 10 '[]'$  - Complex (extensive) Discharge Coordination 0 PROCESS - Special Needs '[]'$  - Pediatric / Minor Patient Management 0 '[]'$  - Isolation Patient Management 0 '[]'$  - Hearing / Language / Visual special needs 0 '[]'$  - Assessment of Community assistance (transportation, D/C planning, etc.) 0 '[]'$  - Additional assistance / Altered mentation 0 '[]'$  - Support Surface(s) Assessment (bed, cushion, seat, etc.) 0 INTERVENTIONS - Wound Cleansing /  Measurement '[]'$  - Simple Wound Cleansing - one wound 0 X - Complex Wound Cleansing - multiple wounds 5 5 X - Wound Imaging (  photographs - any number of wounds) 1 5 '[]'$  - Wound Tracing (instead of photographs) 0 '[]'$  - Simple Wound Measurement - one wound 0 X - Complex Wound Measurement - multiple wounds 5 5 INTERVENTIONS - Wound Dressings '[]'$  - Small Wound Dressing one or multiple wounds 0 X - Medium Wound Dressing one or multiple wounds 5 15 '[]'$  - Large Wound Dressing one or multiple wounds 0 '[]'$  - Application of Medications - topical 0 '[]'$  - Application of Medications - injection 0 INTERVENTIONS - Miscellaneous '[]'$  - External ear exam 0 Dolinski, Britian C. ([id number]) '[]'$  - Specimen Collection (cultures, biopsies, blood, body fluids, etc.) 0 '[]'$  - Specimen(s) / Culture(s) sent or taken to Lab for analysis 0 X - Patient Transfer (multiple staff / Civil Service fast streamer / Similar devices) 1 10 '[]'$  - Simple Staple / Suture removal (25 or less) 0 '[]'$  - Complex Staple / Suture removal (26 or more) 0 '[]'$  - Hypo / Hyperglycemic Management (close monitor of Blood Glucose) 0 '[]'$  - Ankle / Brachial Index (ABI) - do not check if billed separately 0 X - Vital Signs 1 5 Has the patient been seen at the hospital within the last three years: Yes Total Score: 200 Level Of Care: New/Established - Level 5 Electronic Signature(s) Signed: 12/04/2016 5:44:12 PM By: Gretta Cool, BSN, RN, CWS, Kim RN, BSN Entered By: Gretta Cool, BSN, RN, CWS, Kim on 12/04/2016 15:19:47 Preuss, Leslye Peer ([id number]) -------------------------------------------------------------------------------- Encounter Discharge Information Details Patient Name: [name], [name] Date of Service: 12/04/2016 2:45 PM Medical Record Number: [id number] Patient Account Number: [id number] Date of Birth/Sex: February 26, 1951 (66 y.o. Male) Treating RN: Cornell Barman Primary Care Ayona Yniguez: Otilio Miu Other Clinician: Referring Molleigh Huot: Otilio Miu Treating Seiya Silsby/Extender:  Tito Dine in Treatment: 11 Encounter Discharge Information Items Discharge Pain Level: 0 Discharge Condition: Stable Ambulatory Status: Wheelchair Discharge Destination: Home Transportation: Private Auto Accompanied By: wife Schedule Follow-up Appointment: Yes Medication Reconciliation completed and provided to Patient/Care Yes Chivon Lepage: Provided on Clinical Summary of Care: 12/04/2016 Form Type Recipient Paper Patient [facility] Electronic Signature(s) Signed: 12/04/2016 5:44:12 PM By: Gretta Cool, BSN, RN, CWS, Kim RN, BSN Entered By: Gretta Cool, BSN, RN, CWS, Kim on 12/04/2016 15:20:44 Fendley, Leslye Peer ([id number]) -------------------------------------------------------------------------------- Lower Extremity Assessment Details Patient Name: Benge, Jaydin C. Date of Service: 12/04/2016 2:45 PM Medical Record Number: [id number] Patient Account Number: [id number] Date of Birth/Sex: 04-26-1950 (66 y.o. Male) Treating RN: Cornell Barman Primary Care Island Dohmen: Otilio Miu Other Clinician: Referring Chevella Pearce: Otilio Miu Treating Dekota Kirlin/Extender: Tito Dine in Treatment: 31 Electronic Signature(s) Signed: 12/04/2016 5:44:12 PM By: Gretta Cool, BSN, RN, CWS, Kim RN, BSN Entered By: Gretta Cool, BSN, RN, CWS, Kim on 12/04/2016 15:03:13 [place], Leslye Peer ([id number]) -------------------------------------------------------------------------------- Multi Wound Chart Details Patient Name: KEYLIN, PODOLSKY C. Date of Service: 12/04/2016 2:45 PM Medical Record Number: [id number] Patient Account Number: [id number] Date of Birth/Sex: 1950-06-16 (66 y.o. Male) Treating RN: Cornell Barman Primary Care Saia Derossett: Otilio Miu Other Clinician: Referring Laszlo Ellerby: Otilio Miu Treating Phyllicia Dudek/Extender: Ricard Dillon Weeks in Treatment: 45 Vital Signs Height(in): 70 Pulse(bpm): 93 Weight(lbs): 187 Blood Pressure 114/58 (mmHg): Body Mass Index(BMI): 27 Temperature(F): 98.1 Respiratory  Rate 16 (breaths/min): Photos: [1:No Photos] [4:No Photos] [5:No Photos] Wound Location: [1:Right, Proximal Back] [4:Left Gluteus] [5:Right Gluteus] Wounding Event: [1:Pressure Injury] [4:Pressure Injury] [5:Pressure Injury] Primary Etiology: [1:Pressure Ulcer] [4:Pressure Ulcer] [5:Pressure Ulcer] Comorbid History: [1:N/A] [4:N/A] [5:Coronary Artery Disease, Type II Diabetes, Quadriplegia, Seizure Disorder] Date Acquired: [1:11/24/2015] [4:07/09/2016] [5:09/10/2016] Weeks of Treatment: [1:45] [4:16] [5:11] Wound Status: [1:Open] [4:Open] [5:Open] Measurements  L x W x D 0.5x0.6x0.1 [4:0.7x1x0.1] [5:4x3.6x0.8] (cm) Area (cm) : [1:0.236] [4:0.55] [5:11.31] Volume (cm) : [1:0.024] [4:0.055] [5:9.048] % Reduction in Area: [1:97.10%] [4:-250.30%] [5:-100.00%] % Reduction in Volume: 97.00% [4:-243.70%] [5:-1501.40%] Starting Position 1 [5:6] (o'clock): Ending Position 1 [5:10] (o'clock): Maximum Distance 1 [5:0.6] (cm): Undermining: [1:N/A] [4:N/A] [5:Yes] Classification: [1:Category/Stage II] [4:Category/Stage II] [5:Category/Stage IV] Exudate Amount: [1:N/A] [4:N/A] [5:Large] Exudate Type: [1:N/A] [4:N/A] [5:Serous] Exudate Color: [1:N/A] [4:N/A] [5:amber] Wound Margin: [1:N/A] [4:N/A] [5:Flat and Intact] Granulation Amount: [1:N/A] [4:N/A] [5:Small (1-33%)] Granulation Quality: N/A N/A Red Necrotic Amount: N/A N/A Large (67-100%) Periwound Skin Texture: No Abnormalities Noted No Abnormalities Noted No Abnormalities Noted Periwound Skin No Abnormalities Noted No Abnormalities Noted No Abnormalities Noted Moisture: Periwound Skin Color: No Abnormalities Noted No Abnormalities Noted Erythema: Yes Erythema Location: N/A N/A Circumferential Temperature: N/A N/A No Abnormality Tenderness on No No Yes Palpation: Wound Preparation: N/A N/A Ulcer Cleansing: Rinsed/Irrigated with Saline Topical Anesthetic Applied: Other: lidocaine 4% Wound Number: 6 7 N/A Photos: No Photos No  Photos N/A Wound Location: Medial Sacrum Right Trochanter N/A Wounding Event: Pressure Injury Pressure Injury N/A Primary Etiology: Pressure Ulcer Pressure Ulcer N/A Comorbid History: N/A N/A N/A Date Acquired: 10/16/2016 11/06/2016 N/A Weeks of Treatment: 6 2 N/A Wound Status: Open Open N/A Measurements L x W x D 2x4x0.1 1x1.8x0.1 N/A (cm) Area (cm) : 6.283 1.414 N/A Volume (cm) : 0.628 0.141 N/A % Reduction in Area: 70.40% -50.10% N/A % Reduction in Volume: 70.40% -50.00% N/A Undermining: N/A N/A N/A Classification: Category/Stage II Unstageable/Unclassified N/A Exudate Amount: N/A N/A N/A Exudate Type: N/A N/A N/A Exudate Color: N/A N/A N/A Wound Margin: N/A N/A N/A Granulation Amount: N/A N/A N/A Granulation Quality: N/A N/A N/A Necrotic Amount: N/A N/A N/A Exposed Structures: N/A N/A N/A Periwound Skin Texture: No Abnormalities Noted No Abnormalities Noted N/A Periwound Skin No Abnormalities Noted No Abnormalities Noted N/A Moisture: Periwound Skin Color: No Abnormalities Noted No Abnormalities Noted N/A Erythema Location: N/A N/A N/A Temperature: N/A N/A N/A Kobashigawa, Emmanuell C. ([id number]) Tenderness on No No N/A Palpation: Wound Preparation: N/A N/A N/A Treatment Notes Electronic Signature(s) Signed: 12/04/2016 4:33:30 PM By: Linton Ham MD Entered By: Linton Ham on 12/04/2016 15:11:45 Reade, Leslye Peer ([id number]) -------------------------------------------------------------------------------- Multi-Disciplinary Care Plan Details Patient Name: ANDREWJAMES, WEIRAUCH C. Date of Service: 12/04/2016 2:45 PM Medical Record Number: [id number] Patient Account Number: [id number] Date of Birth/Sex: 1950-09-03 (66 y.o. Male) Treating RN: Cornell Barman Primary Care Aireal Slater: Otilio Miu Other Clinician: Referring Henritta Mutz: Otilio Miu Treating Rhys Lichty/Extender: Ricard Dillon Weeks in Treatment: 24 Active Inactive ` Nutrition Nursing Diagnoses: Imbalanced  nutrition Impaired glucose control: actual or potential Goals: Patient/caregiver agrees to and verbalizes understanding of need to use nutritional supplements and/or vitamins as prescribed Date Initiated: 01/24/2016 Target Resolution Date: 03/27/2016 Goal Status: Active Patient/caregiver will maintain therapeutic glucose control Date Initiated: 01/24/2016 Target Resolution Date: 03/27/2016 Goal Status: Active Interventions: Assess patient nutrition upon admission and as needed per policy Provide education on elevated blood sugars and impact on wound healing Notes: ` Orientation to the Wound Care Program Nursing Diagnoses: Knowledge deficit related to the wound healing center program Goals: Patient/caregiver will verbalize understanding of the [place] Date Initiated: 01/24/2016 Target Resolution Date: 03/27/2016 Goal Status: Active Interventions: Provide education on orientation to the wound center Notes: DOMANIQUE, LUCKETT. ([id number]) ` Pain, Acute or Chronic Nursing Diagnoses: Pain, acute or chronic: actual or potential Potential alteration in comfort, pain Goals: Patient will verbalize adequate pain control and receive pain  control interventions during procedures as needed Date Initiated: 01/24/2016 Target Resolution Date: 03/27/2016 Goal Status: Active Patient/caregiver will verbalize adequate pain control between visits Date Initiated: 01/24/2016 Target Resolution Date: 03/27/2016 Goal Status: Active Patient/caregiver will verbalize comfort level met Date Initiated: 01/24/2016 Target Resolution Date: 03/27/2016 Goal Status: Active Interventions: Assess comfort goal upon admission Complete pain assessment as per visit requirements Notes: ` Pressure Nursing Diagnoses: Knowledge deficit related to management of pressures ulcers Potential for impaired tissue integrity related to pressure, friction, moisture, and shear Goals: Patient will remain  free from development of additional pressure ulcers Date Initiated: 01/24/2016 Target Resolution Date: 03/27/2016 Goal Status: Active Interventions: Assess: immobility, friction, shearing, incontinence upon admission and as needed Assess offloading mechanisms upon admission and as needed Assess potential for pressure ulcer upon admission and as needed Provide education on pressure ulcers Notes: MCGWIRE, DASARO ([id number]) Wound/Skin Impairment Nursing Diagnoses: Impaired tissue integrity Goals: Ulcer/skin breakdown will have a volume reduction of 30% by week 4 Date Initiated: 01/24/2016 Target Resolution Date: 03/27/2016 Goal Status: Active Ulcer/skin breakdown will have a volume reduction of 50% by week 8 Date Initiated: 01/24/2016 Target Resolution Date: 03/27/2016 Goal Status: Active Ulcer/skin breakdown will have a volume reduction of 80% by week 12 Date Initiated: 01/24/2016 Target Resolution Date: 03/27/2016 Goal Status: Active Interventions: Assess patient/caregiver ability to perform ulcer/skin care regimen upon admission and as needed Assess ulceration(s) every visit Notes: Electronic Signature(s) Signed: 12/04/2016 5:44:12 PM By: Gretta Cool, BSN, RN, CWS, Kim RN, BSN Entered By: Gretta Cool, BSN, RN, CWS, Kim on 12/04/2016 15:03:21 Gut, Leslye Peer ([id number]) -------------------------------------------------------------------------------- Pain Assessment Details Patient Name: JOHNANTHONY, WILDEN C. Date of Service: 12/04/2016 2:45 PM Medical Record Number: [id number] Patient Account Number: [id number] Date of Birth/Sex: [birth year] (66 y.o. Male) Treating RN: Cornell Barman Primary Care Madeline Pho: Otilio Miu Other Clinician: Referring Carlitos Bottino: Otilio Miu Treating Margaretta Chittum/Extender: Ricard Dillon Weeks in Treatment: 32 Active Problems Location of Pain Severity and Description of Pain Patient Has Paino No Site Locations With Dressing Change: No Pain Management and  Medication Current Pain Management: Electronic Signature(s) Signed: 12/04/2016 5:44:12 PM By: Gretta Cool, BSN, RN, CWS, Kim RN, BSN Entered By: Gretta Cool, BSN, RN, CWS, Kim on 12/04/2016 14:44:48 Tisdell, Leslye Peer ([id number]) -------------------------------------------------------------------------------- Wound Assessment Details Patient Name: TORRIAN, CANION C. Date of Service: 12/04/2016 2:45 PM Medical Record Number: [id number] Patient Account Number: [id number] Date of Birth/Sex: 1950-05-15 (66 y.o. Male) Treating RN: Cornell Barman Primary Care Issabella Rix: Otilio Miu Other Clinician: Referring Aiden Rao: Otilio Miu Treating Jozey Janco/Extender: Ricard Dillon Weeks in Treatment: 45 Wound Status Wound Number: 1 Primary Etiology: Pressure Ulcer Wound Location: Right, Proximal Back Wound Status: Open Wounding Event: Pressure Injury Date Acquired: 11/24/2015 Weeks Of Treatment: 45 Clustered Wound: No Photos Photo Uploaded By: Gretta Cool, BSN, RN, CWS, Kim on 12/04/2016 15:59:46 Wound Measurements Length: (cm) 0.5 Width: (cm) 0.6 Depth: (cm) 0.1 Area: (cm) 0.236 Volume: (cm) 0.024 % Reduction in Area: 97.1% % Reduction in Volume: 97% Wound Description Classification: Category/Stage II Periwound Skin Texture Texture Color No Abnormalities Noted: No No Abnormalities Noted: No Moisture No Abnormalities Noted: No Treatment Notes Wound #1 (Right, Proximal Back) 1. Cleansed with: Clean wound with Normal Saline 2. Anesthetic Primeau, Mylz C. ([id number]) Topical Lidocaine 4% cream to wound bed prior to debridement 3. Peri-wound Care: Skin Prep 4. Dressing Applied: Aquacel Ag 5. Secondary Dressing Applied Bordered Foam Dressing Electronic Signature(s) Signed: 12/04/2016 5:44:12 PM By: Gretta Cool, BSN, RN, CWS, Kim RN, BSN Entered By: Gretta Cool, BSN, RN, CWS, Kim on 12/04/2016 14:54:36  CAP, MASSI  ([id number]) -------------------------------------------------------------------------------- Wound Assessment Details Patient Name: KIMI, KROFT. Date of Service: 12/04/2016 2:45 PM Medical Record Number: [id number] Patient Account Number: [id number] Date of Birth/Sex: 1950/08/31 (66 y.o. Male) Treating RN: Cornell Barman Primary Care Carsen Machi: Otilio Miu Other Clinician: Referring Ajah Vanhoose: Otilio Miu Treating Abigale Dorow/Extender: Ricard Dillon Weeks in Treatment: 39 Wound Status Wound Number: 4 Primary Etiology: Pressure Ulcer Wound Location: Left Gluteus Wound Status: Open Wounding Event: Pressure Injury Date Acquired: 07/09/2016 Weeks Of Treatment: 16 Clustered Wound: No Photos Photo Uploaded By: Gretta Cool, BSN, RN, CWS, Kim on 12/04/2016 15:59:46 Wound Measurements Length: (cm) 0.7 Width: (cm) 1 Depth: (cm) 0.1 Area: (cm) 0.55 Volume: (cm) 0.055 % Reduction in Area: -250.3% % Reduction in Volume: -243.7% Wound Description Classification: Category/Stage II Periwound Skin Texture Texture Color No Abnormalities Noted: No No Abnormalities Noted: No Moisture No Abnormalities Noted: No Treatment Notes Wound #4 (Left Gluteus) 1. Cleansed with: Clean wound with Normal Saline 2. Anesthetic Steinhart, Visente C. ([id number]) Topical Lidocaine 4% cream to wound bed prior to debridement 3. Peri-wound Care: Skin Prep 4. Dressing Applied: Aquacel Ag 5. Secondary Dressing Applied Bordered Foam Dressing Electronic Signature(s) Signed: 12/04/2016 5:44:12 PM By: Gretta Cool, BSN, RN, CWS, Kim RN, BSN Entered By: Gretta Cool, BSN, RN, CWS, Kim on 12/04/2016 14:56:22 Cook, Leslye Peer ([id number]) -------------------------------------------------------------------------------- Wound Assessment Details Patient Name: DONTAI, PEMBER C. Date of Service: 12/04/2016 2:45 PM Medical Record Number: [id number] Patient Account Number: [id number] Date of Birth/Sex: 10-Dec-1950 (66 y.o.  Male) Treating RN: Cornell Barman Primary Care Trevonte Ashkar: Otilio Miu Other Clinician: Referring Hollan Philipp: Otilio Miu Treating Arleth Mccullar/Extender: Ricard Dillon Weeks in Treatment: 45 Wound Status Wound Number: 5 Primary Pressure Ulcer Etiology: Wound Location: Right Gluteus Wound Open Wounding Event: Pressure Injury Status: Date Acquired: 09/10/2016 Comorbid Coronary Artery Disease, Type II Weeks Of Treatment: 11 History: Diabetes, Quadriplegia, Seizure Clustered Wound: No Disorder Photos Photo Uploaded By: Gretta Cool, BSN, RN, CWS, Kim on 12/05/2016 09:41:49 Wound Measurements Length: (cm) 4 Width: (cm) 3.6 Depth: (cm) 0.8 Area: (cm) 11.31 Volume: (cm) 9.048 % Reduction in Area: -100% % Reduction in Volume: -1501.4% Undermining: Yes Starting Position (o'clock): 6 Ending Position (o'clock): 10 Maximum Distance: (cm) 0.6 Wound Description Classification: Category/Stage IV Wound Margin: Flat and Intact Exudate Amount: Large Exudate Type: Serous Exudate Color: amber Foul Odor After Cleansing: No Slough/Fibrino Yes Wound Bed Granulation Amount: Small (1-33%) Exposed Structure Granulation Quality: Red Fascia Exposed: No Necrotic Amount: Large (67-100%) Fat Layer (Subcutaneous Tissue) Exposed: Yes Necrotic Quality: Adherent Slough Tendon Exposed: No Haberer, Leilan C. ([id number]) Muscle Exposed: Yes Necrosis of Muscle: No Joint Exposed: No Bone Exposed: No Periwound Skin Texture Texture Color No Abnormalities Noted: No No Abnormalities Noted: No Erythema: Yes Moisture Erythema Location: Circumferential No Abnormalities Noted: No Temperature / Pain Temperature: No Abnormality Tenderness on Palpation: Yes Wound Preparation Ulcer Cleansing: Rinsed/Irrigated with Saline Topical Anesthetic Applied: Other: lidocaine 4%, Treatment Notes Wound #5 (Right Gluteus) 1. Cleansed with: Clean wound with Normal Saline 2. Anesthetic Topical Lidocaine 4% cream to  wound bed prior to debridement 3. Peri-wound Care: Skin Prep 4. Dressing Applied: Aquacel Ag 5. Secondary Dressing Applied Bordered Foam Dressing Electronic Signature(s) Signed: 12/04/2016 5:44:12 PM By: Gretta Cool, BSN, RN, CWS, Kim RN, BSN Entered By: Gretta Cool, BSN, RN, CWS, Kim on 12/04/2016 14:56:52 [place], Leslye Peer ([id number]) -------------------------------------------------------------------------------- Wound Assessment Details Patient Name: ZYIERE, ROSEMOND C. Date of Service: 12/04/2016 2:45 PM Medical Record Number: [id number] Patient Account Number: [id number] Date of Birth/Sex: Oct 27, 1950 (66 y.o. Male) Treating RN: Gretta Cool,  [place] Primary Care Sadarius Norman: Otilio Miu Other Clinician: Referring Lorrine Killilea: Otilio Miu Treating Avanell Banwart/Extender: Ricard Dillon Weeks in Treatment: 1 Wound Status Wound Number: 6 Primary Etiology: Pressure Ulcer Wound Location: Medial Sacrum Wound Status: Open Wounding Event: Pressure Injury Date Acquired: 10/16/2016 Weeks Of Treatment: 6 Clustered Wound: No Photos Photo Uploaded By: Gretta Cool, BSN, RN, CWS, Kim on 12/05/2016 09:41:50 Wound Measurements Length: (cm) 2 Width: (cm) 4 Depth: (cm) 0.1 Area: (cm) 6.283 Volume: (cm) 0.628 % Reduction in Area: 70.4% % Reduction in Volume: 70.4% Wound Description Classification: Category/Stage II Periwound Skin Texture Texture Color No Abnormalities Noted: No No Abnormalities Noted: No Moisture No Abnormalities Noted: No Treatment Notes Wound #6 (Medial Sacrum) 1. Cleansed with: Clean wound with Normal Saline 2. Anesthetic Alesi, Trevyn C. ([id number]) Topical Lidocaine 4% cream to wound bed prior to debridement 3. Peri-wound Care: Skin Prep 4. Dressing Applied: Aquacel Ag 5. Secondary Dressing Applied Bordered Foam Dressing Electronic Signature(s) Signed: 12/04/2016 5:44:12 PM By: Gretta Cool, BSN, RN, CWS, Kim RN, BSN Entered By: Gretta Cool, BSN, RN, CWS, Kim on 12/04/2016 14:58:12 [place],  Leslye Peer ([id number]) -------------------------------------------------------------------------------- Wound Assessment Details Patient Name: KENNIE, SNEDDEN C. Date of Service: 12/04/2016 2:45 PM Medical Record Number: [id number] Patient Account Number: [id number] Date of Birth/Sex: 01-Jun-1950 (66 y.o. Male) Treating RN: Cornell Barman Primary Care Merikay Lesniewski: Otilio Miu Other Clinician: Referring Daesha Insco: Otilio Miu Treating Reeve Turnley/Extender: Ricard Dillon Weeks in Treatment: 32 Wound Status Wound Number: 7 Primary Etiology: Pressure Ulcer Wound Location: Right Trochanter Wound Status: Open Wounding Event: Pressure Injury Date Acquired: 11/06/2016 Weeks Of Treatment: 2 Clustered Wound: No Photos Photo Uploaded By: Gretta Cool, BSN, RN, CWS, Kim on 12/05/2016 09:41:50 Wound Measurements Length: (cm) 1 Width: (cm) 1.8 Depth: (cm) 0.1 Area: (cm) 1.414 Volume: (cm) 0.141 % Reduction in Area: -50.1% % Reduction in Volume: -50% Wound Description Classification: Unstageable/Unclassified Periwound Skin Texture Texture Color No Abnormalities Noted: No No Abnormalities Noted: No Moisture No Abnormalities Noted: No Treatment Notes Wound #7 (Right Trochanter) 1. Cleansed with: Clean wound with Normal Saline 2. Anesthetic Boling, Hezekiah C. ([id number]) Topical Lidocaine 4% cream to wound bed prior to debridement 3. Peri-wound Care: Skin Prep 4. Dressing Applied: Aquacel Ag 5. Secondary Dressing Applied Bordered Foam Dressing Electronic Signature(s) Signed: 12/04/2016 5:44:12 PM By: Gretta Cool, BSN, RN, CWS, Kim RN, BSN Entered By: Gretta Cool, BSN, RN, CWS, Kim on 12/04/2016 14:58:12 Aynor, Leslye Peer ([id number]) -------------------------------------------------------------------------------- Vitals Details Patient Name: BELA, NYBORG. Date of Service: 12/04/2016 2:45 PM Medical Record Number: [id number] Patient Account Number: [id number] Date of Birth/Sex: Jul 01, 1950 (66 y.o.  Male) Treating RN: Cornell Barman Primary Care Kenley Rettinger: Otilio Miu Other Clinician: Referring Vonette Grosso: Otilio Miu Treating Cassandra Harbold/Extender: Ricard Dillon Weeks in Treatment: 45 Vital Signs Time Taken: 14:44 Temperature (F): 98.1 Height (in): 70 Pulse (bpm): 93 Weight (lbs): 187 Respiratory Rate (breaths/min): 16 Body Mass Index (BMI): 26.8 Blood Pressure (mmHg): 114/58 Reference Range: 80 - 120 mg / dl Electronic Signature(s) Signed: 12/04/2016 5:44:12 PM By: Gretta Cool, BSN, RN, CWS, Kim RN, BSN Entered By: Gretta Cool, BSN, RN, CWS, Kim on 12/04/2016 14:45:11

## 2016-12-05 NOTE — Progress Notes (Signed)
KAI, CALICO ([id number]) Visit Report for 12/04/2016 HPI Details Patient Name: [name], [name]. Date of Service: 12/04/2016 2:45 PM Medical Record Number: [id number] Patient Account Number: [id number] Date of Birth/Sex: Dec 29, 1950 (67 y.o. 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 He has lower extremity sensation and can move his legs but has no/limited control over his arms. His wife accompanies him today and states that in the late spring or early summer of 2017 the patient became very depressed. He refused the refused to mobilize and he developed several pressure ulcers on his back. Most of these have healed however they have a recalcitrant area over the right scapula. They've been using Santyl on this for at least the last month. In terms of depression the patient is doing better now on an antidepressant. He saw his primary physician on 01/12/16 at which time there was apparently green drainage coming out of this area [Dr. Deana Jones]. Dr. Yetta Barre works in the [facility]. He has completed this Septra. Otherwise looking through [facility] notes that he has a history of seizures. He also had a stroke in 2008 he follows with neurology. He also has type 2 diabetes on Glucophage, hyperlipidemia and gastroesophageal reflux. He takes Plavix for stroke prevention and Keppra for seizure prophylaxis. 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 02/08/16; this is a patient with a pressure ulcer over the right scapula. His wife has been doing the dressing with Santyl and  border foam change every second day. When he arrived here 2 weeks ago we did a fairly extensive mechanical debridement. We are asked medical modalities to go out to the home and see what they might be eligible for in terms of pressure-relief surfaces [level 2] but the wife states that they have not heard from them. 03/20/15; this is a patient we haven't seen in 5-6 weeks. This was largely due to transportation issues. They've been using Santyl and border foam changing every second day for a pressure injury over the right scapula. The patient has a C3-C4 spinal injury. We had also asked for a review by the people who supplied DME to look at his wheelchair cushion, mattress etc. I don't know that this ever happened. In the meantime the wound has done remarkably well current measurements 1.8 x 2 x 0.1 04/03/16; the patient's dimensions have gone up to 3 cm in diameter quite a deterioration from last time. He is also complaining of pain in this area which is new. 04/10/16; 3 x 1.5 x 0.1. No difference from last week. Culture I did last week was negative he has completed antibiotics. 04/24/16 2.4 x 2 x 0.1; wound generally looks smaller. Middle area that I had to debrided last time looks healthier. His son is fashioned a large piece of foam cut out where the patient's wound with hit the back of his wheelchair 05/01/16; wound is a same size however the surface of this looks better. The middle innkeeper area required a repeat debridement 05/15/16; wound is down and dimensions granulation still looks healthy. The medial aspect of this wound is now the deeper Divot. We'll see  how this responds to further healing. We're using Hydrofera Blue 06/05/16; Wound 1.7x1.1x0.1 still using hyudrofera blue [name], [name] BienRICHARD C. ([id number]) 06/26/16; the patient arrives back in clinic after a three-week hiatus. He was hospitalized from 06/09/16 through 06/10/16. He was found to be confused. CT scan of the head showed nothing really  acute. His sodium was low at 131. He was rehydrated. He was felt to have a UTI and given antibiotics and antibiotics at discharge although his final culture result only showed multiple organisms. His wife says today that at the time of the hospitalization they discovered a large intact blister over the large aspect of his left calcaneus. This is recently ruptured. The wound on his right scapula is somewhat larger. More his wife is concerned about his current status. She states that he is still confused sleeps for long periods. He is not having fever chills cough or diarrhea [1 loose bowel movement per day]. He continues to have a suprapubic catheter. She notes that he is not eating and drinking well. Patient states he just does not want to eat. He does not feel nauseated or vomit. 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 Admission sodium was 131 at discharge 139 BUN 22 and 1.22 at admission, 17 and 1 at discharge. 07/03/16; patient's mental status is back to normal. He has a new wound on the right elbow caused by traumatizing his elbow against the wall apparently at the dermatologist office last week. We continue with the original wound on the right scapula and the wound from 2 weeks ago on his left heel. We have been using silver alginate to the area on the heel. 08/14/16; patient has not been here in almost 6 weeks. When he was here last time he had the pressure ulcer on the right scapula, atraumatic wound on the right lateral elbow and an area on his left heel. His wife states that at one point all of these were healed and she didn't really feel he needed to come back here. Since then the patient has developed a reopening of the area on the right scapula, a stage II wound on the left buttock and a reopening of the area on the right lateral elbow. As usual his wife as a litany of complaints against home  health, she is dismissing or is going to dismiss well care. She tells us she has a long list of supplies at home already for some reason they were not felt to be eligible for a group 2 or 3 surface although they have a hospital bed at home but no offloading surface 08/28/16; the patient arrived today unfortunately incontinent of stool in spite of this the wounds all appear to be better including the right scapular area, his left buttock's left heel and the right lateral elbow. 09/18/16; this is a patient who arrived today for follow-up of a pressure area over his right scapula area, left buttock and there was an area on his right lateral elbow. He arrived in clinic today with a worrisome area over the right initial tuberosity/right buttock. This had a necrotic surface with draining purulence. He has not been systemically unwell. The area over the left elbow healed. He arrived in clinic today with dressing that hadn't been changed over the scapula for 2 or 3 days per her intake. His wife is previously fired home health 09/25/16;; the last time the patient was here he arrived  with a new grossly infected wound over the right ischial tuberosity. Culture I did not show a specific pathogen. I did think this required admission to hospital and he was admitted from 09/18/16 through 09/20/16. Initially given vanc and Zosyn but then discharged with 5 days' worth of Augmentin. An MRI was done that did not show osteomyelitis of the right ischial tuberosity however it did show cellulitis and possible myositis. He also has wounds on the left ischial tuberosity and the area over the right scapular area. 10/04/16 on evaluation today patient appears to be doing better in regard to his back wound and is stable in regard to the gluteal wounds. There does not appear to be any evidence of significant infection at this point. He is tolerating the dressing changes currently. His wife has been performing the dressing  changes. Nonetheless he does have some discomfort especially in the gluteal areas although he did not specifically rate the discomfort there were times during evaluation where he flinched and it was obvious it was bothering him. No fevers, chills, nausea, or vomiting noted at this time. 10/17/16; the patient has bilateral ischial tuberosity wounds right greater than left. The left seems to be making good progress wears the right has not really changed that much and dimensions. There is also Dacus, Quentez C. ([id number]) some threatened area on the right side around the wound circumference. Equally worrisome there is a DTI over the lower sacral area however this is not open as of yet. His area over the left scapula which is the chronic wound he was coming to the clinic continues to make excellent progress 8/28 The patient comes in today wanting to talk about getting back in his wheelchair. He is very bored lying in bed at home 11/14/16; three-week hiatus for this patient for medical illnesses whether etc. He was in the emergency room at [facility] 2 days ago when his wife noted odor coming out of the right initial tuberosity wound and discoloration. I reviewed his ER presentation from 11/12/16. White count was 11.7 differential count reasonably normal basic metabolic panel was normal. Serum lactate was 1.9. He has a suprapubic catheter which showed positive urine nitrate large leukocytes and many bacteria. Urine cultures come back showing multiple species. Blood culture was negative. Wound culture is still pending. He was put on Keflex and Bactrim. The patient had an MRI of this area on 09/18/16 that did not show osteomyelitis however at that time there was possible cellulitis and infectious myositis no drainable fluid collection. After this he wishes admitted the hospital had vancomycin and Zosyn for 5 days and then 5 days of Augmentin 11/20/16; culture of the wound that I did last week showed  multiple organisms. This was the same as from the emergency room as it turns out. He is still on Keflex and Bactrim and completing this. I was really expecting something a little more ominous although he seems to have stabilized. We have been using silver alginate to the major wound on the right ischial tuberosity. He had a DTI over the right greater trochanter, superficial wound over the left ischial tuberosity and a DTI on the superior part of the left hemipelvis. FORTUNATELY everything looks a little better here than last week. At my suggestion his wife looked into [facility] specialty hospitals but was told that he needed a 3 day acute hospital stay. This would be similar to what is required for skilled nursing and I was not specifically aware of this although it may have  something to do with the patient's specific insurance 11/27/16; he has completed his antibiotics. All of his wounds appeared to have stabilized. This includes the necrotic right ischial tuberosity wound, DTI on the superior pelvis, DTI over the left greater trochanter. We have been using Aquacel Ag to all wound areas. He arrives in clinic today with a low temperature at first not registering at all then 91.5. After serial checks with different instruments we got this up to 94 although the patient looks fine is mentating normally. His wife reports that this is often the pattern when he goes to the hospital and that they'll put him in a heating blanket and send him home although I don't really see evidence of this on the most recent ER trips. She states she has a warming blanket and a rectal for mom under the registers down to 91. I've asked her to check his temperature when she goes home. They both seemed very reluctant to go to the emergency department over this and to be truthful he looks stable enough to avoid this at least for now 12/03/16; patient arrives with all of his wounds and a considerably better situation. His wife  states she has been changing him and turning him. The major remaining wound is in the right ischial tuberosity over even it looks well granulated and the copious amounts of necrotic tissue that I took out several weeks ago with coexistent infection looks to be resolved. We have been using silver alginate to all wounds Electronic Signature(s) Signed: 12/04/2016 4:33:30 PM By: Baltazar Najjar MD Entered By: Baltazar Najjar on 12/04/2016 15:13:36 Sharp, [name] ([id number]) -------------------------------------------------------------------------------- Physical Exam Details Patient Name: Scharrer, Abishai C. Date of Service: 12/04/2016 2:45 PM Medical Record Number: [id number] Patient Account Number: [id number] Date of Birth/Sex: [birth year] (66 y.o. Male) Treating RN: Huel Coventry Primary Care Provider: Elizabeth Sauer Other Clinician: Referring Provider: Elizabeth Sauer Treating Provider/Extender: Maxwell Caul Weeks in Treatment: 45 Constitutional Sitting or standing Blood Pressure is within target range for patient.. Pulse regular and within target range for patient.Marland Kitchen Respirations regular, non-labored and within target range.. Temperature is normal and within the target range for the patient.Marland Kitchen appears in no distress. Eyes Conjunctivae clear. No discharge. Cardiovascular Heart rhythm and rate regular, without murmur or gallop.. Gastrointestinal (GI) Mildly distended but no masses. Lymphatic None palpable in the bilateral inguinal area. Psychiatric No evidence of depression, anxiety, or agitation. Calm, cooperative, and communicative. Appropriate interactions and affect.. Notes Wound exam; patient continues to look better. Right ischial tuberosity wound has nice healthy granulation with no evidence of anything that needed debridement -left pelvic wound superficial and health oLeft ischial tuberosity wound is superficial. oHis DT injuries from 3-4 weeks ago all are satisfactorly  resolved -His original wound over the Electronic Signature(s) Signed: 12/04/2016 4:33:30 PM By: Baltazar Najjar MD Entered By: Baltazar Najjar on 12/04/2016 15:16:58 [name], [name] ([id number]) -------------------------------------------------------------------------------- Physician Orders Details Patient Name: [name] Bras C. Date of Service: 12/04/2016 2:45 PM Medical Record Number: [id number] Patient Account Number: [id number] Date of Birth/Sex: Sep 10, 1950 (66 y.o. Male) Treating RN: Huel Coventry Primary Care Provider: Elizabeth Sauer Other Clinician: Referring Provider: Elizabeth Sauer Treating Provider/Extender: [place] in Treatment: 82 Verbal / Phone Orders: No Diagnosis Coding Wound Cleansing Wound #1 Right,Proximal Back o Clean wound with Normal Saline. o Cleanse wound with mild soap and water Wound #4 Left Gluteus o Clean wound with Normal Saline. o Cleanse wound with mild soap and water Wound #5 Right Gluteus o Clean  wound with Normal Saline. o Cleanse wound with mild soap and water Wound #6 Medial Sacrum o Clean wound with Normal Saline. o Cleanse wound with mild soap and water Wound #7 Right Trochanter o Clean wound with Normal Saline. o Cleanse wound with mild soap and water Anesthetic Wound #5 Right Gluteus o Topical Lidocaine 4% cream applied to wound bed prior to debridement Skin Barriers/Peri-Wound Care Wound #1 Right,Proximal Back o Skin Prep Wound #4 Left Gluteus o Skin Prep Wound #5 Right Gluteus o Skin Prep Wound #6 Medial Sacrum o Skin Prep [name], [name] ([id number]) Wound #7 Right Trochanter o Skin Prep Primary Wound Dressing Wound #1 Right,Proximal Back o Aquacel Ag Wound #4 Left Gluteus o Aquacel Ag Wound #5 Right Gluteus o Aquacel Ag Wound #6 Medial Sacrum o Aquacel Ag Wound #7 Right Trochanter o Aquacel Ag Secondary Dressing Wound #1 Right,Proximal Back o Boardered Foam  Dressing Wound #4 Left Gluteus o Boardered Foam Dressing Wound #5 Right Gluteus o Boardered Foam Dressing Wound #6 Medial Sacrum o Boardered Foam Dressing Wound #7 Right Trochanter o Boardered Foam Dressing Dressing Change Frequency Wound #1 Right,Proximal Back o Change dressing every other day. Wound #4 Left Gluteus o Change dressing every other day. Wound #5 Right Gluteus o Change dressing every other day. Wound #6 Medial Sacrum o Change dressing every other day. [name], [name] ([id number]) Wound #7 Right Trochanter o Change dressing every other day. Follow-up Appointments Wound #1 Right,Proximal Back o Return Appointment in 1 week. Wound #4 Left Gluteus o Return Appointment in 1 week. Wound #5 Right Gluteus o Return Appointment in 1 week. Wound #6 Medial Sacrum o Return Appointment in 1 week. Wound #7 Right Trochanter o Return Appointment in 1 week. Off-Loading Wound #1 Right,Proximal Back o Roho cushion for wheelchair o Turn and reposition every 2 hours Wound #4 Left Gluteus o Roho cushion for wheelchair o Turn and reposition every 2 hours Wound #5 Right Gluteus o Roho cushion for wheelchair o Turn and reposition every 2 hours Wound #6 Medial Sacrum o Roho cushion for wheelchair o Turn and reposition every 2 hours Wound #7 Right Trochanter o Roho cushion for wheelchair o Turn and reposition every 2 hours Additional Orders / Instructions Wound #1 Right,Proximal Back o Increase protein intake. Wound #4 Left Gluteus o Increase protein intake. [name], [name] ([id number]) Wound #5 Right Gluteus o Increase protein intake. Wound #6 Medial Sacrum o Increase protein intake. Wound #7 Right Trochanter o Increase protein intake. Medications-please add to medication list. Wound #1 Right,Proximal Back o Other: - Vitamins A, C and Zinc Wound #4 Left Gluteus o Other: - Vitamins A, C and Zinc Wound  #5 Right Gluteus o Other: - Vitamins A, C and Zinc Wound #6 Medial Sacrum o Other: - Vitamins A, C and Zinc Wound #7 Right Trochanter o Other: - Vitamins A, C and Zinc Electronic Signature(s) Signed: 12/04/2016 4:33:30 PM By: Baltazar Najjar MD Signed: 12/04/2016 5:44:12 PM By: Elliot Gurney, BSN, RN, CWS, Kim RN, BSN Entered By: Elliot Gurney, BSN, RN, CWS, Kim on 12/04/2016 15:07:10 [name], [name] ([id number]) -------------------------------------------------------------------------------- Problem List Details Patient Name: DALLON, DACOSTA C. Date of Service: 12/04/2016 2:45 PM Medical Record Number: [id number] Patient Account Number: [id number] Date of Birth/Sex: March 20, 1950 (66 y.o. Male) Treating RN: Huel Coventry Primary Care Provider: Elizabeth Sauer Other Clinician: Referring Provider: Elizabeth Sauer Treating Provider/Extender: Maxwell Caul Weeks in Treatment: 104 Active Problems ICD-10 Encounter Code Description Active Date Diagnosis 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, stage 3 01/24/2016  Yes S14.103S Unspecified injury at C3 level of cervical spinal cord, 01/24/2016 Yes sequela S51.011A Laceration without foreign body of right elbow, initial 07/03/2016 Yes encounter L89.322 Pressure ulcer of left buttock, stage 2 08/14/2016 Yes L89.310 Pressure ulcer of right buttock, unstageable 09/18/2016 Yes L03.317 Cellulitis of buttock 09/18/2016 Yes Inactive Problems Resolved Problems ICD-10 Code Description Active Date Resolved Date L89.622 Pressure ulcer of left heel, stage 2 06/26/2016 06/26/2016 Electronic Signature(s) Signed: 12/04/2016 4:33:30 PM By: Baltazar Najjar MD Entered By: Baltazar Najjar on 12/04/2016 15:11:17 [name], [name] ([id number]) [name], [name] ([id number]) -------------------------------------------------------------------------------- Progress Note Details Patient Name: [name] Bras C. Date of Service: 12/04/2016 2:45 PM Medical Record Number: [id number] Patient  Account Number: [id number] Date of Birth/Sex: 1950-04-17 (66 y.o. 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 He has lower extremity sensation and can move his legs but has no/limited control over his arms. His wife accompanies him today and states that in the late spring or early summer of 2017 the patient became very depressed. He refused the refused to mobilize and he developed several pressure ulcers on his back. Most of these have healed however they have a recalcitrant area over the right scapula. They've been using Santyl on this for at least the last month. In terms of depression the patient is doing better now on an antidepressant. He saw his primary physician on 01/12/16 at which time there was apparently green drainage coming out of this area [Dr. Deana Jones]. Dr. Yetta Barre works in the [facility]. He has completed this Septra. Otherwise looking through [facility] notes that he has a history of seizures. He also had a stroke in 2008 he follows with neurology. He also has type 2 diabetes on Glucophage, hyperlipidemia and gastroesophageal reflux. He takes Plavix for stroke prevention and Keppra for seizure prophylaxis. 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 02/08/16; this is a patient with a pressure ulcer over the right scapula. His wife has been doing the dressing with Santyl and border foam change every second day. When he arrived here 2 weeks ago we did a fairly extensive mechanical debridement. We are asked medical modalities to go out to the home and see  what they might be eligible for in terms of pressure-relief surfaces [level 2] but the wife states that they have not heard from them. 03/20/15; this is a patient we haven't seen in 5-6 weeks. This was largely due to transportation issues. They've been using Santyl and border foam changing every second day for a pressure injury over the right scapula. The patient has a C3-C4 spinal injury. We had also asked for a review by the people who supplied DME to look at his wheelchair cushion, mattress etc. I don't know that this ever happened. In the meantime the wound has done remarkably well current measurements 1.8 x 2 x 0.1 04/03/16; the patient's dimensions have gone up to 3 cm in diameter quite a deterioration from last time. He is also complaining of pain in this area which is new. 04/10/16; 3 x 1.5 x 0.1. No difference from last week. Culture I did last week was  negative he has completed antibiotics. 04/24/16 2.4 x 2 x 0.1; wound generally looks smaller. Middle area that I had to debrided last time looks healthier. His son is fashioned a large piece of foam cut out where the patient's wound with hit the back of his wheelchair 05/01/16; wound is a same size however the surface of this looks better. The middle innkeeper area required a repeat debridement 05/15/16; wound is down and dimensions granulation still looks healthy. The medial aspect of this wound is now the deeper Divot. We'll see how this responds to further healing. We're using Hydrofera Blue 06/05/16; Wound 1.7x1.1x0.1 still using hyudrofera blue 06/26/16; the patient arrives back in clinic after a three-week hiatus. He was hospitalized from 06/09/16 through [facility] ([id number]) 06/10/16. He was found to be confused. CT scan of the head showed nothing really acute. His sodium was low at 131. He was rehydrated. He was felt to have a UTI and given antibiotics and antibiotics at discharge although his final culture result only showed  multiple organisms. His wife says today that at the time of the hospitalization they discovered a large intact blister over the large aspect of his left calcaneus. This is recently ruptured. The wound on his right scapula is somewhat larger. More his wife is concerned about his current status. She states that he is still confused sleeps for long periods. He is not having fever chills cough or diarrhea [1 loose bowel movement per day]. He continues to have a suprapubic catheter. She notes that he is not eating and drinking well. Patient states he just does not want to eat. He does not feel nauseated or vomit. 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 Admission sodium was 131 at discharge 139 BUN 22 and 1.22 at admission, 17 and 1 at discharge. 07/03/16; patient's mental status is back to normal. He has a new wound on the right elbow caused by traumatizing his elbow against the wall apparently at the dermatologist office last week. We continue with the original wound on the right scapula and the wound from 2 weeks ago on his left heel. We have been using silver alginate to the area on the heel. 08/14/16; patient has not been here in almost 6 weeks. When he was here last time he had the pressure ulcer on the right scapula, atraumatic wound on the right lateral elbow and an area on his left heel. His wife states that at one point all of these were healed and she didn't really feel he needed to come back here. Since then the patient has developed a reopening of the area on the right scapula, a stage II wound on the left buttock and a reopening of the area on the right lateral elbow. As usual his wife as a litany of complaints against home health, she is dismissing or is going to dismiss well care. She tells [place] she has a long list of supplies at home already for some reason they were not felt to be eligible for a group  2 or 3 surface although they have a hospital bed at home but no offloading surface 08/28/16; the patient arrived today unfortunately incontinent of stool in spite of this the wounds all appear to be better including the right scapular area, his left buttock's left heel and the right lateral elbow. 09/18/16; this is a patient who arrived today for follow-up of a pressure area  over his right scapula area, left buttock and there was an area on his right lateral elbow. He arrived in clinic today with a worrisome area over the right initial tuberosity/right buttock. This had a necrotic surface with draining purulence. He has not been systemically unwell. The area over the left elbow healed. He arrived in clinic today with dressing that hadn't been changed over the scapula for 2 or 3 days per her intake. His wife is previously fired home health 09/25/16;; the last time the patient was here he arrived with a new grossly infected wound over the right ischial tuberosity. Culture I did not show a specific pathogen. I did think this required admission to hospital and he was admitted from 09/18/16 through 09/20/16. Initially given vanc and Zosyn but then discharged with 5 days' worth of Augmentin. An MRI was done that did not show osteomyelitis of the right ischial tuberosity however it did show cellulitis and possible myositis. He also has wounds on the left ischial tuberosity and the area over the right scapular area. 10/04/16 on evaluation today patient appears to be doing better in regard to his back wound and is stable in regard to the gluteal wounds. There does not appear to be any evidence of significant infection at this point. He is tolerating the dressing changes currently. His wife has been performing the dressing changes. Nonetheless he does have some discomfort especially in the gluteal areas although he did not specifically rate the discomfort there were times during evaluation where he flinched and it  was obvious it was bothering him. No fevers, chills, nausea, or vomiting noted at this time. 10/17/16; the patient has bilateral ischial tuberosity wounds right greater than left. The left seems to be making good progress wears the right has not really changed that much and dimensions. There is also some threatened area on the right side around the wound circumference. Equally worrisome there is a DTI over the lower sacral area however this is not open as of yet. His area over the left scapula which is the [facility]. ([id number]) chronic wound he was coming to the clinic continues to make excellent progress 8/28 The patient comes in today wanting to talk about getting back in his wheelchair. He is very bored lying in bed at home 11/14/16; three-week hiatus for this patient for medical illnesses whether etc. He was in the emergency room at [place] 2 days ago when his wife noted odor coming out of the right initial tuberosity wound and discoloration. I reviewed his ER presentation from 11/12/16. White count was 11.7 differential count reasonably normal basic metabolic panel was normal. Serum lactate was 1.9. He has a suprapubic catheter which showed positive urine nitrate large leukocytes and many bacteria. Urine cultures come back showing multiple species. Blood culture was negative. Wound culture is still pending. He was put on Keflex and Bactrim. The patient had an MRI of this area on 09/18/16 that did not show osteomyelitis however at that time there was possible cellulitis and infectious myositis no drainable fluid collection. After this he wishes admitted the hospital had vancomycin and Zosyn for 5 days and then 5 days of Augmentin 11/20/16; culture of the wound that I did last week showed multiple organisms. This was the same as from the emergency room as it turns out. He is still on Keflex and Bactrim and completing this. I was really expecting something a little more ominous although  he seems to have stabilized. We have been using silver  alginate to the major wound on the right ischial tuberosity. He had a DTI over the right greater trochanter, superficial wound over the left ischial tuberosity and a DTI on the superior part of the left hemipelvis. FORTUNATELY everything looks a little better here than last week. At my suggestion his [facility] but was told that he needed a 3 day acute hospital stay. This would be similar to what is required for skilled nursing and I was not specifically aware of this although it may have something to do with the patient's specific insurance 11/27/16; he has completed his antibiotics. All of his wounds appeared to have stabilized. This includes the necrotic right ischial tuberosity wound, DTI on the superior pelvis, DTI over the left greater trochanter. We have been using Aquacel Ag to all wound areas. He arrives in clinic today with a low temperature at first not registering at all then 91.5. After serial checks with different instruments we got this up to 94 although the patient looks fine is mentating normally. His wife reports that this is often the pattern when he goes to the hospital and that they'll put him in a heating blanket and send him home although I don't really see evidence of this on the most recent ER trips. She states she has a warming blanket and a rectal for mom under the registers down to 91. I've asked her to check his temperature when she goes home. They both seemed very reluctant to go to the emergency department over this and to be truthful he looks stable enough to avoid this at least for now 12/03/16; patient arrives with all of his wounds and a considerably better situation. His wife states she has been changing him and turning him. The major remaining wound is in the right ischial tuberosity over even it looks well granulated and the copious amounts of necrotic tissue that I took out  several weeks ago with coexistent infection looks to be resolved. We have been using silver alginate to all wounds Objective Pollino, Himmat C. ([id number]) Constitutional Sitting or standing Blood Pressure is within target range for patient.. Pulse regular and within target range for patient.Marland Kitchen Respirations regular, non-labored and within target range.. Temperature is normal and within the target range for the patient.Marland Kitchen appears in no distress. Vitals Time Taken: 2:44 PM, Height: 70 in, Weight: 187 lbs, BMI: 26.8, Temperature: 98.1 F, Pulse: 93 bpm, Respiratory Rate: 16 breaths/min, Blood Pressure: 114/58 mmHg. Eyes Conjunctivae clear. No discharge. Cardiovascular Heart rhythm and rate regular, without murmur or gallop.. Gastrointestinal (GI) Mildly distended but no masses. Lymphatic None palpable in the bilateral inguinal area. Psychiatric No evidence of depression, anxiety, or agitation. Calm, cooperative, and communicative. Appropriate interactions and affect.. General Notes: Wound exam; patient continues to look better. Right ischial tuberosity wound has nice healthy granulation with no evidence of anything that needed debridement -left pelvic wound superficial and health Left ischial tuberosity wound is superficial. His DT injuries from 3-4 weeks ago all are satisfactorly resolved -His original wound over the Integumentary (Hair, Skin) Wound #1 status is Open. Original cause of wound was Pressure Injury. The wound is located on the Right,Proximal Back. The wound measures 0.5cm length x 0.6cm width x 0.1cm depth; 0.236cm^2 area and 0.024cm^3 volume. Wound #4 status is Open. Original cause of wound was Pressure Injury. The wound is located on the Left Gluteus. The wound measures 0.7cm length x 1cm width x 0.1cm depth; 0.55cm^2 area and 0.055cm^3 volume. Wound #5  status is Open. Original cause of wound was Pressure Injury. The wound is located on the Right Gluteus. The wound  measures 4cm length x 3.6cm width x 0.8cm depth; 11.31cm^2 area and 9.048cm^3 volume. There is muscle and Fat Layer (Subcutaneous Tissue) Exposed exposed. There is undermining starting at 6:00 and ending at 10:00 with a maximum distance of 0.6cm. There is a large amount of serous drainage noted. The wound margin is flat and intact. There is small (1-33%) red granulation within the wound bed. There is a large (67-100%) amount of necrotic tissue within the wound bed including Adherent Slough. The periwound skin appearance exhibited: Erythema. The surrounding wound skin color is noted with erythema which is circumferential. Periwound temperature was noted as No Abnormality. The periwound has tenderness on palpation. RESHAWN, [name] ([id number]) Wound #6 status is Open. Original cause of wound was Pressure Injury. The wound is located on the Medial Sacrum. The wound measures 2cm length x 4cm width x 0.1cm depth; 6.283cm^2 area and 0.628cm^3 volume. Wound #7 status is Open. Original cause of wound was Pressure Injury. The wound is located on the Right Trochanter. The wound measures 1cm length x 1.8cm width x 0.1cm depth; 1.414cm^2 area and 0.141cm^3 volume. 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 Cleanse wound with mild soap and water Wound #4 Left Gluteus: Clean wound with Normal Saline. Cleanse wound with mild soap and water Wound #5 Right Gluteus: Clean wound with Normal Saline. Cleanse wound with mild soap and water Wound #6 Medial Sacrum: Clean wound with Normal  Saline. Cleanse wound with mild soap and water Wound #7 Right Trochanter: Clean wound with Normal Saline. Cleanse wound with mild soap and water Anesthetic: Wound #5 Right Gluteus: Topical Lidocaine 4% cream applied to wound bed prior to debridement Skin Barriers/Peri-Wound Care: OLYN, LANDSTROM ([id number]) Wound #1 Right,Proximal Back: Skin Prep Wound #4 Left Gluteus: Skin Prep Wound #5 Right Gluteus: Skin Prep Wound #6 Medial Sacrum: Skin Prep Wound #7 Right Trochanter: Skin Prep Primary Wound Dressing: Wound #1 Right,Proximal Back: Aquacel Ag Wound #4 Left Gluteus: Aquacel Ag Wound #5 Right Gluteus: Aquacel Ag Wound #6 Medial Sacrum: Aquacel Ag Wound #7 Right Trochanter: Aquacel Ag Secondary Dressing: Wound #1 Right,Proximal Back: Boardered Foam Dressing Wound #4 Left Gluteus: Boardered Foam Dressing Wound #5 Right Gluteus: Boardered Foam Dressing Wound #6 Medial Sacrum: Boardered Foam Dressing Wound #7 Right Trochanter: Boardered Foam Dressing Dressing Change Frequency: Wound #1 Right,Proximal Back: Change dressing every other day. Wound #4 Left Gluteus: Change dressing every other day. Wound #5 Right Gluteus: Change dressing every other day. Wound #6 Medial Sacrum: Change dressing every other day. Wound #7 Right Trochanter: Change dressing every other day. Follow-up Appointments: Wound #1 Right,Proximal Back: Return Appointment in 1 week. Wound #4 Left Gluteus: Return Appointment in 1 week. Wound #5 Right Gluteus: Return Appointment in 1 week. Wound #6 Medial Sacrum: [name], HABIBI. ([id number]) Return Appointment in 1 week. Wound #7 Right Trochanter: Return Appointment in 1 week. Off-Loading: Wound #1 Right,Proximal Back: Roho cushion for wheelchair Turn and reposition every 2 hours Wound #4 Left Gluteus: Roho cushion for wheelchair Turn and reposition  every 2 hours Wound #5 Right Gluteus: Roho cushion for wheelchair Turn and  reposition every 2 hours Wound #6 Medial Sacrum: Roho cushion for wheelchair Turn and reposition every 2 hours Wound #7 Right Trochanter: Roho cushion for wheelchair Turn and reposition every 2 hours Additional Orders / Instructions: Wound #1 Right,Proximal Back: Increase protein intake. Wound #4 Left Gluteus: Increase protein intake. Wound #5 Right Gluteus: Increase protein intake. Wound #6 Medial Sacrum: Increase protein intake. Wound #7 Right Trochanter: Increase protein intake. Medications-please add to medication list.: Wound #1 Right,Proximal Back: Other: - Vitamins A, C and Zinc Wound #4 Left Gluteus: Other: - Vitamins A, C and Zinc Wound #5 Right Gluteus: Other: - Vitamins A, C and Zinc Wound #6 Medial Sacrum: Other: - Vitamins A, C and Zinc Wound #7 Right Trochanter: Other: - Vitamins A, C and Zinc 1I think we can continue with silver alginate to all of these wounds. The only one of any substances over the right ischial tuberosity. His original wound over the right scapula slightly smaller than a dime at this point [facility] ([id number]) #2 I think we can put his next appointment 2 weeks. He has looked better every week now Electronic Signature(s) Signed: 12/04/2016 4:33:30 PM By: Baltazar Najjar MD Entered By: Baltazar Najjar on 12/04/2016 15:18:34 Scheier, [name] ([id number]) -------------------------------------------------------------------------------- SuperBill Details Patient Name: [name] Bras C. Date of Service: 12/04/2016 Medical Record Number: [id number] Patient Account Number: [id number] Date of Birth/Sex: 1950/11/23 (66 y.o. Male) Treating RN: Huel Coventry Primary Care Provider: Elizabeth Sauer Other Clinician: Referring Provider: Elizabeth Sauer Treating Provider/Extender: Maxwell Caul Weeks in Treatment: 45 Diagnosis Coding ICD-10 Codes Code Description [phone] Pressure ulcer of other site, stage 3 S14.103S Unspecified injury at C3  level of cervical spinal cord, sequela S51.011A Laceration without foreign body of right elbow, initial encounter L89.322 Pressure ulcer of left buttock, stage 2 L89.310 Pressure ulcer of right buttock, unstageable L03.317 Cellulitis of buttock Facility Procedures CPT4 Code: [id number] Description: 02725 - WOUND CARE VISIT-LEV 5 EST PT Modifier: Quantity: 1 Physician Procedures CPT4 Code: [id number] Description: 99213 - WC PHYS LEVEL 3 - EST PT ICD-10 Description Diagnosis 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

## 2016-12-12 ENCOUNTER — Other Ambulatory Visit: Payer: Self-pay | Admitting: Family Medicine

## 2016-12-18 ENCOUNTER — Encounter: Payer: Medicare Other | Admitting: Internal Medicine

## 2016-12-18 ENCOUNTER — Encounter: Payer: Self-pay | Admitting: Family Medicine

## 2016-12-18 ENCOUNTER — Ambulatory Visit (INDEPENDENT_AMBULATORY_CARE_PROVIDER_SITE_OTHER): Payer: Medicare Other | Admitting: Family Medicine

## 2016-12-18 VITALS — BP 120/80 | HR 88 | Ht 70.0 in | Wt 125.0 lb

## 2016-12-18 DIAGNOSIS — L89314 Pressure ulcer of right buttock, stage 4: Secondary | ICD-10-CM | POA: Diagnosis not present

## 2016-12-18 DIAGNOSIS — L598 Other specified disorders of the skin and subcutaneous tissue related to radiation: Secondary | ICD-10-CM | POA: Diagnosis not present

## 2016-12-18 DIAGNOSIS — G4701 Insomnia due to medical condition: Secondary | ICD-10-CM | POA: Diagnosis not present

## 2016-12-18 DIAGNOSIS — F3341 Major depressive disorder, recurrent, in partial remission: Secondary | ICD-10-CM

## 2016-12-18 DIAGNOSIS — Z23 Encounter for immunization: Secondary | ICD-10-CM | POA: Diagnosis not present

## 2016-12-18 DIAGNOSIS — I959 Hypotension, unspecified: Secondary | ICD-10-CM | POA: Diagnosis not present

## 2016-12-18 DIAGNOSIS — E11622 Type 2 diabetes mellitus with other skin ulcer: Secondary | ICD-10-CM | POA: Diagnosis not present

## 2016-12-18 DIAGNOSIS — M62830 Muscle spasm of back: Secondary | ICD-10-CM | POA: Diagnosis not present

## 2016-12-18 DIAGNOSIS — Z7984 Long term (current) use of oral hypoglycemic drugs: Secondary | ICD-10-CM | POA: Diagnosis not present

## 2016-12-18 DIAGNOSIS — R569 Unspecified convulsions: Secondary | ICD-10-CM | POA: Diagnosis not present

## 2016-12-18 DIAGNOSIS — G8252 Quadriplegia, C1-C4 incomplete: Secondary | ICD-10-CM | POA: Diagnosis not present

## 2016-12-18 DIAGNOSIS — I2510 Atherosclerotic heart disease of native coronary artery without angina pectoris: Secondary | ICD-10-CM

## 2016-12-18 DIAGNOSIS — G8250 Quadriplegia, unspecified: Secondary | ICD-10-CM | POA: Diagnosis not present

## 2016-12-18 DIAGNOSIS — L89323 Pressure ulcer of left buttock, stage 3: Secondary | ICD-10-CM | POA: Diagnosis not present

## 2016-12-18 DIAGNOSIS — K219 Gastro-esophageal reflux disease without esophagitis: Secondary | ICD-10-CM | POA: Diagnosis not present

## 2016-12-18 DIAGNOSIS — S31829A Unspecified open wound of left buttock, initial encounter: Secondary | ICD-10-CM | POA: Diagnosis not present

## 2016-12-18 DIAGNOSIS — R32 Unspecified urinary incontinence: Secondary | ICD-10-CM | POA: Diagnosis not present

## 2016-12-18 DIAGNOSIS — F419 Anxiety disorder, unspecified: Secondary | ICD-10-CM

## 2016-12-18 DIAGNOSIS — L89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, stage 3: Secondary | ICD-10-CM | POA: Diagnosis not present

## 2016-12-18 DIAGNOSIS — R600 Localized edema: Secondary | ICD-10-CM

## 2016-12-18 DIAGNOSIS — L89152 Pressure ulcer of sacral region, stage 2: Secondary | ICD-10-CM | POA: Diagnosis not present

## 2016-12-18 DIAGNOSIS — E119 Type 2 diabetes mellitus without complications: Secondary | ICD-10-CM | POA: Diagnosis not present

## 2016-12-18 DIAGNOSIS — L89102 Pressure ulcer of unspecified part of back, stage 2: Secondary | ICD-10-CM | POA: Diagnosis not present

## 2016-12-18 DIAGNOSIS — L89310 Pressure ulcer of right buttock, unstageable: Secondary | ICD-10-CM | POA: Diagnosis not present

## 2016-12-18 DIAGNOSIS — R69 Illness, unspecified: Secondary | ICD-10-CM | POA: Diagnosis not present

## 2016-12-18 DIAGNOSIS — D649 Anemia, unspecified: Secondary | ICD-10-CM | POA: Diagnosis not present

## 2016-12-18 DIAGNOSIS — E785 Hyperlipidemia, unspecified: Secondary | ICD-10-CM

## 2016-12-18 DIAGNOSIS — S14103S Unspecified injury at C3 level of cervical spinal cord, sequela: Secondary | ICD-10-CM | POA: Diagnosis not present

## 2016-12-18 DIAGNOSIS — L89322 Pressure ulcer of left buttock, stage 2: Secondary | ICD-10-CM | POA: Diagnosis not present

## 2016-12-18 DIAGNOSIS — L03317 Cellulitis of buttock: Secondary | ICD-10-CM | POA: Diagnosis not present

## 2016-12-18 DIAGNOSIS — S51011A Laceration without foreign body of right elbow, initial encounter: Secondary | ICD-10-CM | POA: Diagnosis not present

## 2016-12-18 DIAGNOSIS — Z8673 Personal history of transient ischemic attack (TIA), and cerebral infarction without residual deficits: Secondary | ICD-10-CM | POA: Diagnosis not present

## 2016-12-18 MED ORDER — BACLOFEN 20 MG PO TABS: 40.0000 mg | ORAL_TABLET | Freq: Every day | ORAL | 5 refills | Status: AC

## 2016-12-18 MED ORDER — CITALOPRAM HYDROBROMIDE 40 MG PO TABS: 40.0000 mg | ORAL_TABLET | Freq: Every day | ORAL | 1 refills | Status: AC

## 2016-12-18 MED ORDER — CLOPIDOGREL BISULFATE 75 MG PO TABS: 75.0000 mg | ORAL_TABLET | Freq: Every day | ORAL | 1 refills | Status: AC

## 2016-12-18 MED ORDER — PANTOPRAZOLE SODIUM 40 MG PO TBEC: 40.0000 mg | DELAYED_RELEASE_TABLET | Freq: Every day | ORAL | 1 refills | Status: AC

## 2016-12-18 MED ORDER — METFORMIN HCL 500 MG PO TABS: 500.0000 mg | ORAL_TABLET | Freq: Two times a day (BID) | ORAL | 1 refills | Status: AC

## 2016-12-18 MED ORDER — MIRTAZAPINE 15 MG PO TABS: 15.0000 mg | ORAL_TABLET | Freq: Every day | ORAL | 5 refills | Status: AC

## 2016-12-18 MED ORDER — ATORVASTATIN CALCIUM 10 MG PO TABS: 10.0000 mg | ORAL_TABLET | Freq: Every day | ORAL | 1 refills | Status: AC

## 2016-12-18 MED ORDER — OXYBUTYNIN CHLORIDE 5 MG PO TABS: 5.0000 mg | ORAL_TABLET | Freq: Two times a day (BID) | ORAL | 1 refills | Status: AC

## 2016-12-18 NOTE — Progress Notes (Signed)
Name: [name]   MRN: [id number]    DOB: 06/08/1950   Date:12/18/2016       Progress Note  Subjective  Chief Complaint  Chief Complaint  Patient presents with  . Gastroesophageal Reflux  . Hyperlipidemia  . Diabetes  . Depression  . Anorexia  . bladder incontinence    Patient needs treturn to mirtazepine for rest and appetite.   Gastroesophageal Reflux  He reports no abdominal pain, no chest pain, no coughing, no dysphagia, no heartburn, no nausea, no sore throat or no wheezing. This is a chronic problem. The current episode started more than 1 year ago. The problem has been gradually worsening. The symptoms are aggravated by certain foods. Pertinent negatives include no anemia, fatigue, melena, muscle weakness, orthopnea or weight loss. There are no known risk factors. He has tried a PPI for the symptoms. The treatment provided moderate relief.  Hyperlipidemia  This is a chronic problem. The problem is controlled. Recent lipid tests were reviewed and are normal. Exacerbating diseases include diabetes. He has no history of chronic renal disease, hypothyroidism, liver disease, obesity or nephrotic syndrome. Pertinent negatives include no chest pain, focal sensory loss, focal weakness, leg pain, myalgias or shortness of breath. Current antihyperlipidemic treatment includes statins. The current treatment provides moderate improvement of lipids. There are no compliance problems.  Risk factors for coronary artery disease include diabetes mellitus and dyslipidemia.  Diabetes  He presents for his follow-up diabetic visit. He has type 2 diabetes mellitus. Pertinent negatives for hypoglycemia include no confusion, dizziness, headaches, hunger, mood changes, nervousness/anxiousness, pallor, seizures, sleepiness, speech difficulty, sweats or tremors. Pertinent negatives for diabetes include no blurred vision, no chest pain, no fatigue, no foot paresthesias, no foot ulcerations, no polydipsia, no  polyphagia, no polyuria, no visual change, no weakness and no weight loss. Symptoms are stable. Pertinent negatives for diabetic complications include no autonomic neuropathy, CVA, heart disease, impotence, nephropathy, peripheral neuropathy, PVD or retinopathy. Current diabetic treatment includes oral agent (monotherapy). He is compliant with treatment all of the time. His weight is decreasing rapidly. He is following a generally unhealthy diet. He participates in exercise three times a week. His home blood glucose trend is fluctuating minimally. His breakfast blood glucose is taken between 8-9 am. His breakfast blood glucose range is generally 90-110 mg/dl. An ACE inhibitor/angiotensin II receptor blocker is not being taken.  Depression       The patient presents with depression.  This is a chronic problem.  The current episode started more than 1 year ago.   The onset quality is gradual.   The problem occurs intermittently.  The problem has been gradually worsening since onset.  Associated symptoms include no decreased concentration, no fatigue, no helplessness, no hopelessness, does not have insomnia, not irritable, no restlessness, no decreased interest, no appetite change, no body aches, no myalgias, no headaches, no indigestion, not sad and no suicidal ideas.     Exacerbated by: circumstances.  Past treatments include SSRIs - Selective serotonin reuptake inhibitors.  Compliance with treatment is good.  Previous treatment provided mild relief.  Past medical history includes chronic pain, anxiety and depression.     Pertinent negatives include no hypothyroidism.   No problem-specific Assessment & Plan notes found for this encounter.   Past Medical History:  Diagnosis Date  . Allergy   . Anxiety   . CAD (coronary artery disease)   . Diabetes mellitus without complication (HCC)   . GERD (gastroesophageal reflux disease)   .  Hyperlipidemia   . Quadriplegia (HCC)   . Seizures (HCC)   . Spinal cord  disease (HCC)   . Stroke [facility])     Past Surgical History:  Procedure Laterality Date  . ENDARTERECTOMY    . ENDARTERECTOMY    . TRACHEOSTOMY      Family History  Problem Relation Age of Onset  . Stroke Father     Social History   Social History  . Marital status: Married    Spouse name: N/A  . Number of children: N/A  . Years of education: N/A   Occupational History  . Not on file.   Social History Main Topics  . Smoking status: Never Smoker  . Smokeless tobacco: Never Used  . Alcohol use No  . Drug use: No  . Sexual activity: Not on file   Other Topics Concern  . Not on file   Social History Narrative  . No narrative on file    No Known Allergies  Outpatient Medications Prior to Visit  Medication Sig Dispense Refill  . diazepam (VALIUM) 5 MG tablet Take 5-10 mg by mouth 3 (three) times daily as needed for muscle spasms. Take 5 mg in the morning, 5 mg at noon and 10 mg every night at bedtime as needed for spasms. Dr Carlena Hurl    . furosemide (LASIX) 20 MG tablet Take 1 tablet (20 mg total) by mouth daily. 90 tablet 1  . levETIRAcetam (KEPPRA) 750 MG tablet Take 750 mg by mouth 3 (three) times daily. Dr Malvin Johns    . atorvastatin (LIPITOR) 10 MG tablet TAKE 1 TABLET BY MOUTH  EVERY DAY 90 tablet 0  . baclofen (LIORESAL) 20 MG tablet Take 40 mg by mouth at bedtime. Dr Carlena Hurl    . citalopram (CELEXA) 40 MG tablet Take 1 tablet (40 mg total) by mouth daily. 90 tablet 1  . clopidogrel (PLAVIX) 75 MG tablet TAKE 1 TABLET BY MOUTH  EVERY DAY 90 tablet 0  . metFORMIN (GLUCOPHAGE) 500 MG tablet Take 500 mg by mouth 2 (two) times daily with a meal.    . mirtazapine (REMERON) 15 MG tablet Take 15 mg by mouth at bedtime. Dr. Carlena Hurl    . oxybutynin (DITROPAN) 5 MG tablet Take 5 mg by mouth 2 (two) times daily.    . pantoprazole (PROTONIX) 40 MG tablet Take 1 tablet (40 mg total) by mouth daily. 90 tablet 1   No facility-administered medications prior to visit.     Review of  Systems  Constitutional: Negative for appetite change, chills, fatigue, fever, malaise/fatigue and weight loss.  HENT: Negative for ear discharge, ear pain and sore throat.   Eyes: Negative for blurred vision.  Respiratory: Negative for cough, sputum production, shortness of breath and wheezing.   Cardiovascular: Negative for chest pain, palpitations and leg swelling.  Gastrointestinal: Negative for abdominal pain, blood in stool, constipation, diarrhea, dysphagia, heartburn, melena and nausea.  Genitourinary: Negative for dysuria, frequency, hematuria, impotence and urgency.  Musculoskeletal: Negative for back pain, joint pain, myalgias, muscle weakness and neck pain.  Skin: Negative for pallor and rash.  Neurological: Negative for dizziness, tingling, tremors, sensory change, focal weakness, seizures, speech difficulty, weakness and headaches.  Endo/Heme/Allergies: Negative for environmental allergies, polydipsia and polyphagia. Does not bruise/bleed easily.  Psychiatric/Behavioral: Positive for depression. Negative for confusion, decreased concentration and suicidal ideas. The patient is not nervous/anxious and does not have insomnia.      Objective  Vitals:   12/18/16 1337  BP: 120/80  Pulse: 88  Weight: 125 lb (56.7 kg)  Height: 5\' 10"  (1.778 m)    Physical Exam  Constitutional: He is oriented to person, place, and time and well-developed, well-nourished, and in no distress. He is not irritable.  HENT:  Head: Normocephalic.  Right Ear: External ear normal.  Left Ear: External ear normal.  Nose: Nose normal.  Mouth/Throat: Oropharynx is clear and moist.  Eyes: Pupils are equal, round, and reactive to light. Conjunctivae and EOM are normal. Right eye exhibits no discharge. Left eye exhibits no discharge. No scleral icterus.  Neck: Normal range of motion. Neck supple. No JVD present. No tracheal deviation present. No thyromegaly present.  Cardiovascular: Normal rate, regular  rhythm, normal heart sounds and intact distal pulses.  Exam reveals no gallop and no friction rub.   No murmur heard. Pulmonary/Chest: Breath sounds normal. No respiratory distress. He has no wheezes. He has no rales.  Abdominal: Soft. Bowel sounds are normal. He exhibits no mass. There is no hepatosplenomegaly. There is no tenderness. There is no rebound, no guarding and no CVA tenderness.  Musculoskeletal: Normal range of motion. He exhibits no edema or tenderness.  Lymphadenopathy:    He has no cervical adenopathy.  Neurological: He is alert and oriented to person, place, and time. He has normal sensation, normal strength, normal reflexes and intact cranial nerves. No cranial nerve deficit.  Skin: Skin is warm. No rash noted.  Psychiatric: Mood and affect normal.  Nursing note and vitals reviewed.     Assessment & Plan  Problem List Items Addressed This Visit      Cardiovascular and Mediastinum   CAD (coronary artery disease)   Relevant Medications   atorvastatin (LIPITOR) 10 MG tablet   clopidogrel (PLAVIX) 75 MG tablet   Other Relevant Orders   Renal Function Panel   CBC w/Diff/Platelet     Digestive   GERD (gastroesophageal reflux disease)   Relevant Medications   pantoprazole (PROTONIX) 40 MG tablet     Other   Insomnia   Anxiety   Relevant Medications   citalopram (CELEXA) 40 MG tablet   mirtazapine (REMERON) 15 MG tablet   HLD (hyperlipidemia)   Relevant Medications   atorvastatin (LIPITOR) 10 MG tablet   Other Relevant Orders   Renal Function Panel   Lipid Profile    Other Visit Diagnoses    Type 2 diabetes mellitus with other skin ulcer, without long-term current use of insulin (HCC)    -  Primary   Relevant Medications   atorvastatin (LIPITOR) 10 MG tablet   metFORMIN (GLUCOPHAGE) 500 MG tablet   Other Relevant Orders   Hemoglobin A1c   Localized edema       Relevant Orders   CBC w/Diff/Platelet   Recurrent major depressive disorder, in partial  remission (HCC)       Relevant Medications   citalopram (CELEXA) 40 MG tablet   mirtazapine (REMERON) 15 MG tablet   Urinary incontinence, unspecified type       Relevant Medications   oxybutynin (DITROPAN) 5 MG tablet   Back spasm       Relevant Medications   baclofen (LIORESAL) 20 MG tablet   Taking medication for chronic disease       Relevant Orders   Hepatic Function Panel (6)   CBC w/Diff/Platelet   Flu vaccine need       Relevant Orders   Flu vaccine HIGH DOSE PF (Completed)      Meds ordered this encounter  Medications  .  citalopram (CELEXA) 40 MG tablet    Sig: Take 1 tablet (40 mg total) by mouth daily.    Dispense:  90 tablet    Refill:  1    Fill these 40mg  instead of 20mg   . mirtazapine (REMERON) 15 MG tablet    Sig: Take 1 tablet (15 mg total) by mouth at bedtime. Dr. Carlena Hurl    Dispense:  30 tablet    Refill:  5  . atorvastatin (LIPITOR) 10 MG tablet    Sig: Take 1 tablet (10 mg total) by mouth daily.    Dispense:  90 tablet    Refill:  1  . clopidogrel (PLAVIX) 75 MG tablet    Sig: Take 1 tablet (75 mg total) by mouth daily.    Dispense:  90 tablet    Refill:  1  . pantoprazole (PROTONIX) 40 MG tablet    Sig: Take 1 tablet (40 mg total) by mouth daily.    Dispense:  90 tablet    Refill:  1  . oxybutynin (DITROPAN) 5 MG tablet    Sig: Take 1 tablet (5 mg total) by mouth 2 (two) times daily.    Dispense:  90 tablet    Refill:  1  . baclofen (LIORESAL) 20 MG tablet    Sig: Take 2 tablets (40 mg total) by mouth at bedtime. Dr Carlena Hurl    Dispense:  60 each    Refill:  5  . metFORMIN (GLUCOPHAGE) 500 MG tablet    Sig: Take 1 tablet (500 mg total) by mouth 2 (two) times daily with a meal.    Dispense:  180 tablet    Refill:  1   I spent 45 minutes with this patient, More than 50% of that time was spent in face to face education, counseling and care coordination.   Dr. Hayden [facility]  12/18/16

## 2016-12-19 ENCOUNTER — Other Ambulatory Visit
Admission: RE | Admit: 2016-12-19 | Discharge: 2016-12-19 | Disposition: A | Discharge status: Home / Self Care | Payer: Medicare Other | Source: Ambulatory Visit | Attending: Internal Medicine | Admitting: Internal Medicine

## 2016-12-19 DIAGNOSIS — S31819A Unspecified open wound of right buttock, initial encounter: Secondary | ICD-10-CM | POA: Diagnosis not present

## 2016-12-19 DIAGNOSIS — X58XXXA Exposure to other specified factors, initial encounter: Secondary | ICD-10-CM | POA: Insufficient documentation

## 2016-12-19 LAB — RENAL FUNCTION PANEL
ALBUMIN: 3.2 g/dL — AB (ref 3.6–4.8)
BUN / CREAT RATIO: 21 (ref 10–24)
BUN: 13 mg/dL (ref 8–27)
CALCIUM: 8.5 mg/dL — AB (ref 8.6–10.2)
CHLORIDE: 98 mmol/L (ref 96–106)
CO2: 27 mmol/L (ref 20–29)
CREATININE: 0.62 mg/dL — AB (ref 0.76–1.27)
GFR calc Af Amer: 119 mL/min/{1.73_m2} (ref >59)
GFR calc non Af Amer: 103 mL/min/{1.73_m2} (ref >59)
Glucose: 79 mg/dL (ref 65–99)
Phosphorus: 3.2 mg/dL (ref 2.5–4.5)
Potassium: 4.3 mmol/L (ref 3.5–5.2)
Sodium: 136 mmol/L (ref 134–144)

## 2016-12-19 LAB — HEMOGLOBIN A1C
Est. average glucose Bld gHb Est-mCnc: 100 mg/dL
HEMOGLOBIN A1C: 5.1 % (ref 4.8–5.6)

## 2016-12-19 LAB — LIPID PANEL
CHOLESTEROL TOTAL: 112 mg/dL (ref 100–199)
Chol/HDL Ratio: 2.7 ratio (ref 0.0–5.0)
HDL: 42 mg/dL (ref >39)
LDL CALC: 51 mg/dL (ref 0–99)
TRIGLYCERIDES: 96 mg/dL (ref 0–149)
VLDL CHOLESTEROL CAL: 19 mg/dL (ref 5–40)

## 2016-12-19 LAB — CBC WITH DIFFERENTIAL/PLATELET
Basophils Absolute: 0 10*3/uL (ref 0.0–0.2)
Basos: 0 %
EOS (ABSOLUTE): 0.4 10*3/uL (ref 0.0–0.4)
EOS: 3 %
Hematocrit: 31.2 % — ABNORMAL LOW (ref 37.5–51.0)
Hemoglobin: 9.9 g/dL — ABNORMAL LOW (ref 13.0–17.7)
IMMATURE GRANULOCYTES: 0 %
Immature Grans (Abs): 0 10*3/uL (ref 0.0–0.1)
LYMPHS: 25 %
Lymphocytes Absolute: 2.9 10*3/uL (ref 0.7–3.1)
MCH: 26.1 pg — ABNORMAL LOW (ref 26.6–33.0)
MCHC: 31.7 g/dL (ref 31.5–35.7)
MCV: 82 fL (ref 79–97)
MONOCYTES: 11 %
MONOS ABS: 1.3 10*3/uL — AB (ref 0.1–0.9)
NEUTROS PCT: 61 %
Neutrophils Absolute: 6.8 10*3/uL (ref 1.4–7.0)
PLATELETS: 386 10*3/uL — AB (ref 150–379)
RBC: 3.79 x10E6/uL — AB (ref 4.14–5.80)
RDW: 16 % — AB (ref 12.3–15.4)
WBC: 11.4 10*3/uL — AB (ref 3.4–10.8)

## 2016-12-19 LAB — HEPATIC FUNCTION PANEL (6)
ALT: 8 IU/L (ref 0–44)
AST: 9 IU/L (ref 0–40)
Alkaline Phosphatase: 123 IU/L — ABNORMAL HIGH (ref 39–117)
BILIRUBIN, DIRECT: 0.14 mg/dL (ref 0.00–0.40)
Bilirubin Total: 0.4 mg/dL (ref 0.0–1.2)

## 2016-12-21 LAB — FERRITIN: Ferritin: 77 ng/mL (ref 30–400)

## 2016-12-21 LAB — SPECIMEN STATUS REPORT

## 2016-12-21 NOTE — Progress Notes (Signed)
WASIF, SIMONICH ([id number]) Visit Report for 12/18/2016 Arrival Information Details Patient Name: [name], [name]. Date of Service: 12/18/2016 3:30 PM Medical Record Number: [id number] Patient Account Number: [id number] Date of Birth/Sex: [birth year] (66 y.o. Male) Treating RN: Cornell Barman Primary Care Hinata Diener: Otilio Miu Other Clinician: Referring Keamber Macfadden: Otilio Miu Treating Seren Chaloux/Extender: Tito Dine in Treatment: 101 Visit Information History Since Last Visit Added or deleted any medications: No Patient Arrived: Wheel Chair Any new allergies or adverse reactions: No Arrival Time: 15:53 Had a fall or experienced change in No activities of daily living that may affect Accompanied By: wife risk of falls: Transfer Assistance: [employer] Signs or symptoms of abuse/neglect since last visito No Patient Identification Verified: Yes Hospitalized since last visit: No Secondary Verification Process Completed: Yes Has Dressing in Place as Prescribed: Yes Patient Requires Transmission-Based No Pain Present Now: Yes Precautions: Patient Has Alerts: Yes Electronic Signature(s) Signed: 12/19/2016 5:14:29 PM By: Gretta Cool, BSN, RN, CWS, Kim RN, BSN Entered By: Gretta Cool, BSN, RN, CWS, Kim on 12/18/2016 15:53:58 Seefeldt, Leslye Peer ([id number]) -------------------------------------------------------------------------------- Encounter Discharge Information Details Patient Name: [name], [name] Date of Service: 12/18/2016 3:30 PM Medical Record Number: [id number] Patient Account Number: [id number] Date of Birth/Sex: 12/23/1950 (66 y.o. Male) Treating RN: Cornell Barman Primary Care Inesha Sow: Otilio Miu Other Clinician: Referring Marquize Seib: Otilio Miu Treating Devell Parkerson/Extender: Tito Dine in Treatment: 70 Encounter Discharge Information Items Discharge Pain Level: 10 Discharge Condition: Stable Ambulatory Status: Wheelchair Discharge Destination:  Home Transportation: Private Auto Accompanied By: wife Schedule Follow-up Appointment: Yes Medication Reconciliation completed and Yes provided to Patient/Care Kleo Dungee: Provided on Clinical Summary of Care: 12/18/2016 Form Type Recipient Paper Patient [facility] Electronic Signature(s) Signed: 12/18/2016 5:33:21 PM By: Gretta Cool, BSN, RN, CWS, Kim RN, BSN Previous Signature: 12/18/2016 4:39:05 PM Version By: Ruthine Dose Entered By: Gretta Cool BSN, RN, CWS, Kim on 12/18/2016 17:33:21 Balsam, Leslye Peer ([id number]) -------------------------------------------------------------------------------- Lower Extremity Assessment Details Patient Name: Haack, [name] Date of Service: 12/18/2016 3:30 PM Medical Record Number: [id number] Patient Account Number: [id number] Date of Birth/Sex: [birth year] (66 y.o. Male) Treating RN: Cornell Barman Primary Care Merilyn Pagan: Otilio Miu Other Clinician: Referring Kyrin Garn: Otilio Miu Treating Anthon Harpole/Extender: Tito Dine in Treatment: 52 Electronic Signature(s) Signed: 12/19/2016 5:14:29 PM By: Gretta Cool, BSN, RN, CWS, Kim RN, BSN Entered By: Gretta Cool, BSN, RN, CWS, Kim on 12/18/2016 16:10:51 Freels, Leslye Peer ([id number]) -------------------------------------------------------------------------------- Multi Wound Chart Details Patient Name: [name], [name] Date of Service: 12/18/2016 3:30 PM Medical Record Number: [id number] Patient Account Number: [id number] Date of Birth/Sex: August 06, 1950 (66 y.o. Male) Treating RN: Cornell Barman Primary Care Zack Crager: Otilio Miu Other Clinician: Referring Tannya Gonet: Otilio Miu Treating Helen Winterhalter/Extender: Ricard Dillon Weeks in Treatment: 71 Vital Signs Height(in): 70 Pulse(bpm): 75 Weight(lbs): 187 Blood Pressure(mmHg): 136/68 Body Mass Index(BMI): 27 Temperature(F): 98.3 Respiratory Rate 16 (breaths/min): Photos: [1:No Photos] [4:No Photos] [5:No Photos] Wound Location: [1:Right Back - Proximal]  [4:Left Gluteus] [5:Right Gluteus] Wounding Event: [1:Pressure Injury] [4:Pressure Injury] [5:Pressure Injury] Primary Etiology: [1:Pressure Ulcer] [4:Pressure Ulcer] [5:Pressure Ulcer] Comorbid History: [1:Coronary Artery Disease, Type Coronary Artery Disease, Type Coronary Artery Disease, Type II Diabetes, Quadriplegia, Seizure Disorder] [4:II Diabetes, Quadriplegia, Seizure Disorder] [5:II Diabetes, Quadriplegia, Seizure Disorder] Date Acquired: [1:11/24/2015] [4:07/09/2016] [5:09/10/2016] Weeks of Treatment: [1:47] [4:18] [5:13] Wound Status: [1:Open] [4:Open] [5:Open] Measurements L x W x D [1:0.2x0.2x0.1] [4:2x1.8x0.1] [5:2.5x3.8x1] (cm) Area (cm) : [1:0.031] [4:2.827] [5:7.461] Volume (cm) : [1:0.003] [4:0.283] [5:7.461] % Reduction in Area: [1:99.60%] [4:-1700.60%] [5:-31.90%] % Reduction in Volume: [1:99.60%] [4:-1668.70%] [5:-1220.50%] Starting  Position 1 [5:6] (o'clock): Ending Position 1 [5:10] (o'clock): Maximum Distance 1 (cm): [5:0.8] Undermining: [1:No] [4:No] [5:Yes] Classification: [1:Category/Stage II] [4:Unstageable/Unclassified] [5:Category/Stage IV] Exudate Amount: [1:None Present] [4:Medium] [5:Large] Exudate Type: [1:N/A] [4:Serosanguineous] [5:Serous] Exudate Color: [1:N/A] [4:red, brown] [5:amber] Foul Odor After Cleansing: [1:No] [4:No] [5:Yes] Odor Anticipated Due to [1:N/A] [4:N/A] [5:No] Product Use: Wound Margin: [1:Flat and Intact] [4:Flat and Intact] [5:Flat and Intact] Granulation Amount: [1:None Present (0%)] [4:None Present (0%)] [5:Medium (34-66%)] Granulation Quality: [1:N/A] [4:N/A] [5:Red] Necrotic Amount: [1:None Present (0%)] [4:Large (67-100%)] [5:Medium (34-66%)] Necrotic Tissue: [1:N/A] [4:Eschar] [5:Adherent Slough] Exposed Structures: [1:Fascia: No Fat Layer (Subcutaneous Tissue) Exposed: No] [4:Fascia: No Fat Layer (Subcutaneous Tissue) Exposed: No] [5:Fat Layer (Subcutaneous Tissue) Exposed: Yes Muscle: Yes] Tendon: No Tendon:  No Fascia: No Muscle: No Muscle: No Tendon: No Joint: No Joint: No Joint: No Bone: No Bone: No Bone: No Limited to Skin Breakdown Epithelialization: Large (67-100%) None None Debridement: N/A N/A Debridement ([id number]) Pre-procedure N/A N/A 16:21 Verification/Time Out Taken: Pain Control: N/A N/A Other Tissue Debrided: N/A N/A Necrotic/Eschar, Fibrin/Slough, Subcutaneous Level: N/A N/A Skin/Subcutaneous Tissue Debridement Area (sq cm): N/A N/A 5.6 Instrument: N/A N/A Blade, Curette, Forceps Specimen: N/A N/A Swab Number of Specimens N/A N/A 1 Taken: Bleeding: N/A N/A Moderate Hemostasis Achieved: N/A N/A Pressure Procedural Pain: N/A N/A 3 Post Procedural Pain: N/A N/A 3 Debridement Treatment N/A N/A Procedure was tolerated well Response: Post Debridement N/A N/A 2.5x3.8x1.2 Measurements L x W x D (cm) Post Debridement Volume: N/A N/A 8.954 (cm) Post Debridement Stage: N/A N/A Category/Stage IV Periwound Skin Texture: No Abnormalities Noted No Abnormalities Noted Induration: Yes Periwound Skin Moisture: No Abnormalities Noted No Abnormalities Noted No Abnormalities Noted Periwound Skin Color: No Abnormalities Noted No Abnormalities Noted Erythema: Yes Erythema Location: N/A N/A Circumferential Temperature: N/A N/A No Abnormality Tenderness on Palpation: No No Yes Wound Preparation: Ulcer Cleansing: Not Cleansed Ulcer Cleansing: Not Cleansed Ulcer Cleansing: Rinsed/Irrigated with Saline Topical Anesthetic Applied: Topical Anesthetic Applied: None None Topical Anesthetic Applied: Other: lidocaine 4% Procedures Performed: N/A N/A Debridement Wound Number: 6 7 N/A Photos: No Photos No Photos N/A Wound Location: Sacrum - Medial Right Trochanter N/A Wounding Event: Pressure Injury Pressure Injury N/A Primary Etiology: Pressure Ulcer Pressure Ulcer N/A Comorbid History: Coronary Artery Disease, Type N/A N/A II Diabetes, Quadriplegia, Seizure Disorder Date  Acquired: 10/16/2016 11/06/2016 N/A Weeks of Treatment: 8 4 N/A Wound Status: Open Healed - Epithelialized N/A Measurements L x W x D 1.3x4x0.1 0x0x0 N/A (cm) Area (cm) : 4.084 0 N/A Meske, Isiah C. ([id number]) Volume (cm) : 0.408 0 N/A % Reduction in Area: 80.70% 100.00% N/A % Reduction in Volume: 80.80% 100.00% N/A Undermining: No N/A N/A Classification: Category/Stage II Unstageable/Unclassified N/A Exudate Amount: Small N/A N/A Exudate Type: Serous N/A N/A Exudate Color: amber N/A N/A Foul Odor After Cleansing: No N/A N/A Odor Anticipated Due to N/A N/A N/A Product Use: Wound Margin: Flat and Intact N/A N/A Granulation Amount: None Present (0%) N/A N/A Granulation Quality: N/A N/A N/A Necrotic Amount: Large (67-100%) N/A N/A Necrotic Tissue: Eschar N/A N/A Exposed Structures: Fascia: No N/A N/A Fat Layer (Subcutaneous Tissue) Exposed: No Tendon: No Muscle: No Joint: No Bone: No Epithelialization: Small (1-33%) N/A N/A Debridement: Debridement ([id number]) N/A N/A Pre-procedure 16:21 N/A N/A Verification/Time Out Taken: Pain Control: Other N/A N/A Tissue Debrided: Necrotic/Eschar, N/A N/A Subcutaneous Level: Skin/Subcutaneous Tissue N/A N/A Debridement Area (sq cm): 5.2 N/A N/A Instrument: Curette N/A N/A Specimen: Swab N/A N/A Number of Specimens 1 N/A N/A Taken:  Bleeding: Moderate N/A N/A Hemostasis Achieved: Pressure N/A N/A Procedural Pain: 3 N/A N/A Post Procedural Pain: 3 N/A N/A Debridement Treatment Procedure was tolerated well N/A N/A Response: Post Debridement 1.3x4x0.2 N/A N/A Measurements L x W x D (cm) Post Debridement Volume: 0.817 N/A N/A (cm) Post Debridement Stage: Category/Stage II N/A N/A Periwound Skin Texture: No Abnormalities Noted No Abnormalities Noted N/A Periwound Skin Moisture: No Abnormalities Noted No Abnormalities Noted N/A Periwound Skin Color: Erythema: Yes No Abnormalities Noted N/A Erythema Location:  Circumferential N/A N/A Temperature: N/A N/A N/A Tenderness on Palpation: No No N/A Wound Preparation: Ulcer Cleansing: Not Cleansed N/A N/A MERTON, WADLOW ([id number]) Topical Anesthetic Applied: None Procedures Performed: Debridement N/A N/A Treatment Notes Electronic Signature(s) Signed: 12/18/2016 4:56:05 PM By: Linton Ham MD Entered By: Linton Ham on 12/18/2016 16:46:06 Fangman, Leslye Peer ([id number]) -------------------------------------------------------------------------------- Multi-Disciplinary Care Plan Details Patient Name: EDISON, NICHOLSON C. Date of Service: 12/18/2016 3:30 PM Medical Record Number: [id number] Patient Account Number: [id number] Date of Birth/Sex: 11/18/1950 (66 y.o. Male) Treating RN: Cornell Barman Primary Care Peachie Barkalow: Otilio Miu Other Clinician: Referring Laketha Leopard: Otilio Miu Treating Rakiyah Esch/Extender: Tito Dine in Treatment: 34 Active Inactive ` Nutrition Nursing Diagnoses: Imbalanced nutrition Impaired glucose control: actual or potential Goals: Patient/caregiver agrees to and verbalizes understanding of need to use nutritional supplements and/or vitamins as prescribed Date Initiated: 01/24/2016 Target Resolution Date: 03/27/2016 Goal Status: Active Patient/caregiver will maintain therapeutic glucose control Date Initiated: 01/24/2016 Target Resolution Date: 03/27/2016 Goal Status: Active Interventions: Assess patient nutrition upon admission and as needed per policy Provide education on elevated blood sugars and impact on wound healing Notes: ` Orientation to the Wound Care Program Nursing Diagnoses: Knowledge deficit related to the wound healing center program Goals: Patient/caregiver will verbalize understanding of the [place] Date Initiated: 01/24/2016 Target Resolution Date: 03/27/2016 Goal Status: Active Interventions: Provide education on orientation to the wound  center Notes: ` Pain, Acute or Chronic Nursing Diagnoses: Pain, acute or chronic: actual or potential Potential alteration in comfort, pain Argote, MARQUEE FUCHS ([id number]) Goals: Patient will verbalize adequate pain control and receive pain control interventions during procedures as needed Date Initiated: 01/24/2016 Target Resolution Date: 03/27/2016 Goal Status: Active Patient/caregiver will verbalize adequate pain control between visits Date Initiated: 01/24/2016 Target Resolution Date: 03/27/2016 Goal Status: Active Patient/caregiver will verbalize comfort level met Date Initiated: 01/24/2016 Target Resolution Date: 03/27/2016 Goal Status: Active Interventions: Assess comfort goal upon admission Complete pain assessment as per visit requirements Notes: ` Pressure Nursing Diagnoses: Knowledge deficit related to management of pressures ulcers Potential for impaired tissue integrity related to pressure, friction, moisture, and shear Goals: Patient will remain free from development of additional pressure ulcers Date Initiated: 01/24/2016 Target Resolution Date: 03/27/2016 Goal Status: Active Interventions: Assess: immobility, friction, shearing, incontinence upon admission and as needed Assess offloading mechanisms upon admission and as needed Assess potential for pressure ulcer upon admission and as needed Provide education on pressure ulcers Notes: ` Wound/Skin Impairment Nursing Diagnoses: Impaired tissue integrity Goals: Ulcer/skin breakdown will have a volume reduction of 30% by week 4 Date Initiated: 01/24/2016 Target Resolution Date: 03/27/2016 Goal Status: Active Ulcer/skin breakdown will have a volume reduction of 50% by week 8 Date Initiated: 01/24/2016 Target Resolution Date: 03/27/2016 Goal Status: Active Ulcer/skin breakdown will have a volume reduction of 80% by week 12 Date Initiated: 01/24/2016 Target Resolution Date: 03/27/2016 ZLATAN, HORNBACK  ([id number]) Goal Status: Active Interventions: Assess patient/caregiver ability to perform ulcer/skin care regimen upon admission and as needed  Assess ulceration(s) every visit Notes: Electronic Signature(s) Signed: 12/19/2016 5:14:29 PM By: Gretta Cool, BSN, RN, CWS, Kim RN, BSN Entered By: Gretta Cool, BSN, RN, CWS, Kim on 12/18/2016 16:15:17 Crabbe, Leslye Peer ([id number]) -------------------------------------------------------------------------------- Pain Assessment Details Patient Name: BRUIN, BOLGER C. Date of Service: 12/18/2016 3:30 PM Medical Record Number: [id number] Patient Account Number: [id number] Date of Birth/Sex: 1950-12-06 (66 y.o. Male) Treating RN: Cornell Barman Primary Care Jamal Haskin: Otilio Miu Other Clinician: Referring Mabry Tift: Otilio Miu Treating Loeta Herst/Extender: Tito Dine in Treatment: 26 Active Problems Location of Pain Severity and Description of Pain Patient Has Paino Yes Site Locations Pain Location: Generalized Pain, Pain in Ulcers Rate the pain. Current Pain Level: 10 Pain Management and Medication Current Pain Management: Electronic Signature(s) Signed: 12/19/2016 5:14:29 PM By: Gretta Cool, BSN, RN, CWS, Kim RN, BSN Entered By: Gretta Cool, BSN, RN, CWS, Kim on 12/18/2016 15:55:10 Febo, Leslye Peer ([id number]) -------------------------------------------------------------------------------- Patient/Caregiver Education Details Patient Name: KAYCEON, OKI. Date of Service: 12/18/2016 3:30 PM Medical Record Number: [id number] Patient Account Number: [id number] Date of Birth/Gender: Jun 05, 1950 (66 y.o. Male) Treating RN: Cornell Barman Primary Care Physician: Otilio Miu Other Clinician: Referring Physician: Otilio Miu Treating Physician/Extender: Tito Dine in Treatment: 60 Education Assessment Education Provided To: Patient and Caregiver Education Topics Provided Pressure: Handouts: Pressure Ulcers: Care and  Offloading Methods: Demonstration, Explain/Verbal Responses: State content correctly Wound/Skin Impairment: Handouts: Caring for Your Ulcer, Other: continue wound care as prescribed Methods: Demonstration, Explain/Verbal Responses: State content correctly Electronic Signature(s) Signed: 12/19/2016 5:14:29 PM By: Gretta Cool, BSN, RN, CWS, Kim RN, BSN Entered By: Gretta Cool, BSN, RN, CWS, Kim on 12/18/2016 17:33:57 Barsamian, Leslye Peer ([id number]) -------------------------------------------------------------------------------- Wound Assessment Details Patient Name: Klahn, Halden C. Date of Service: 12/18/2016 3:30 PM Medical Record Number: [id number] Patient Account Number: [id number] Date of Birth/Sex: 01-May-1950 (66 y.o. Male) Treating RN: Cornell Barman Primary Care Florenda Watt: Otilio Miu Other Clinician: Referring Tonetta Napoles: Otilio Miu Treating Shanae Luo/Extender: Ricard Dillon Weeks in Treatment: 23 Wound Status Wound Number: 1 Primary Pressure Ulcer Etiology: Wound Location: Right Back - Proximal Wound Open Wounding Event: Pressure Injury Status: Date Acquired: 11/24/2015 Comorbid Coronary Artery Disease, Type II Diabetes, Weeks Of Treatment: 47 History: Quadriplegia, Seizure Disorder Clustered Wound: No Photos Photo Uploaded By: Gretta Cool, BSN, RN, CWS, Kim on 12/18/2016 17:25:11 Wound Measurements Length: (cm) 0.2 Width: (cm) 0.2 Depth: (cm) 0.1 Area: (cm) 0.031 Volume: (cm) 0.003 % Reduction in Area: 99.6% % Reduction in Volume: 99.6% Epithelialization: Large (67-100%) Tunneling: No Undermining: No Wound Description Classification: Category/Stage II Foul O Wound Margin: Flat and Intact Slough Exudate Amount: None Present dor After Cleansing: No /Fibrino No Wound Bed Granulation Amount: None Present (0%) Exposed Structure Necrotic Amount: None Present (0%) Fascia Exposed: No Fat Layer (Subcutaneous Tissue) Exposed: No Tendon Exposed: No Muscle Exposed: No Joint  Exposed: No Bone Exposed: No Limited to Skin Breakdown Periwound Skin Texture Texture Color No Abnormalities Noted: No No Abnormalities Noted: No Moisture No Abnormalities Noted: No [place], Lennis C. ([id number]) Wound Preparation Ulcer Cleansing: Not Cleansed Topical Anesthetic Applied: None Treatment Notes Wound #1 (Right, Proximal Back) 1. Cleansed with: Clean wound with Normal Saline 2. Anesthetic Topical Lidocaine 4% cream to wound bed prior to debridement 3. Peri-wound Care: Skin Prep 4. Dressing Applied: Aquacel Ag 5. Secondary Dressing Applied Bordered Foam Dressing Electronic Signature(s) Signed: 12/19/2016 5:14:29 PM By: Gretta Cool, BSN, RN, CWS, Kim RN, BSN Entered By: Gretta Cool, BSN, RN, CWS, Kim on 12/18/2016 16:07:47 [place]ier, Leslye Peer ([id number]) -------------------------------------------------------------------------------- Wound Assessment Details Patient Name: JOHNNATHAN, HAGEMEISTER C. Date  of Service: 12/18/2016 3:30 PM Medical Record Number: [id number] Patient Account Number: [id number] Date of Birth/Sex: 1950/05/14 (66 y.o. Male) Treating RN: Cornell Barman Primary Care Joffrey Kerce: Otilio Miu Other Clinician: Referring Skylur Fuston: Otilio Miu Treating Garvis Downum/Extender: Ricard Dillon Weeks in Treatment: 51 Wound Status Wound Number: 4 Primary Pressure Ulcer Etiology: Wound Location: Left Gluteus Wound Open Wounding Event: Pressure Injury Status: Date Acquired: 07/09/2016 Comorbid Coronary Artery Disease, Type II Diabetes, Weeks Of Treatment: 18 History: Quadriplegia, Seizure Disorder Clustered Wound: No Photos Photo Uploaded By: Gretta Cool, BSN, RN, CWS, Kim on 12/18/2016 17:25:11 Wound Measurements Length: (cm) 2 Width: (cm) 1.8 Depth: (cm) 0.1 Area: (cm) 2.827 Volume: (cm) 0.283 % Reduction in Area: -1700.6% % Reduction in Volume: -1668.7% Epithelialization: None Tunneling: No Undermining: No Wound Description Classification:  Unstageable/Unclassified Wound Margin: Flat and Intact Exudate Amount: Medium Exudate Type: Serosanguineous Exudate Color: red, brown Foul Odor After Cleansing: No Slough/Fibrino Yes Wound Bed Granulation Amount: None Present (0%) Exposed Structure Necrotic Amount: Large (67-100%) Fascia Exposed: No Necrotic Quality: Eschar Fat Layer (Subcutaneous Tissue) Exposed: No Tendon Exposed: No Muscle Exposed: No Joint Exposed: No Bone Exposed: No Periwound Skin Texture Texture Color No Abnormalities Noted: No No Abnormalities Noted: No Moisture Modisette, Saif C. ([id number]) No Abnormalities Noted: No Wound Preparation Ulcer Cleansing: Not Cleansed Topical Anesthetic Applied: None Treatment Notes Wound #4 (Left Gluteus) 1. Cleansed with: Clean wound with Normal Saline 2. Anesthetic Topical Lidocaine 4% cream to wound bed prior to debridement 3. Peri-wound Care: Skin Prep 4. Dressing Applied: Aquacel Ag 5. Secondary Dressing Applied Bordered Foam Dressing Electronic Signature(s) Signed: 12/19/2016 5:14:29 PM By: Gretta Cool, BSN, RN, CWS, Kim RN, BSN Entered By: Gretta Cool, BSN, RN, CWS, Kim on 12/18/2016 16:08:36 [place], Leslye Peer ([id number]) -------------------------------------------------------------------------------- Wound Assessment Details Patient Name: INES, WARF C. Date of Service: 12/18/2016 3:30 PM Medical Record Number: [id number] Patient Account Number: [id number] Date of Birth/Sex: 1951-01-04 (66 y.o. Male) Treating RN: Cornell Barman Primary Care Mayerli Kirst: Otilio Miu Other Clinician: Referring Mariska Daffin: Otilio Miu Treating Lucely Leard/Extender: Ricard Dillon Weeks in Treatment: 53 Wound Status Wound Number: 5 Primary Pressure Ulcer Etiology: Wound Location: Right Gluteus Wound Open Wounding Event: Pressure Injury Status: Date Acquired: 09/10/2016 Comorbid Coronary Artery Disease, Type II Diabetes, Weeks Of Treatment: 13 History: Quadriplegia, Seizure  Disorder Clustered Wound: No Photos Photo Uploaded By: Gretta Cool, BSN, RN, CWS, Kim on 12/18/2016 17:25:51 Wound Measurements Length: (cm) 2.5 % Reducti Width: (cm) 3.8 % Reducti Depth: (cm) 1 Epithelia Area: (cm) 7.461 Tunnelin Volume: (cm) 7.461 Undermin Starti Ending Maximu on in Area: -31.9% on in Volume: -1220.5% lization: None g: No ing: Yes ng Position (o'clock): 6 Position (o'clock): 10 m Distance: (cm) 0.8 Wound Description Classification: Category/Stage IV Foul Odor Wound Margin: Flat and Intact Due to Pr Exudate Amount: Large Slough/Fi Exudate Type: Serous Exudate Color: amber After Cleansing: Yes oduct Use: No brino Yes Wound Bed Granulation Amount: Medium (34-66%) Exposed Structure Granulation Quality: Red Fascia Exposed: No Necrotic Amount: Medium (34-66%) Fat Layer (Subcutaneous Tissue) Exposed: Yes Necrotic Quality: Adherent Slough Tendon Exposed: No Muscle Exposed: Yes Necrosis of Muscle: No Joint Exposed: No Bone Exposed: No Lejeune, Baldwin C. ([id number]) Periwound Skin Texture Texture Color No Abnormalities Noted: No No Abnormalities Noted: No Induration: Yes Erythema: Yes Erythema Location: Circumferential Moisture No Abnormalities Noted: No Temperature / Pain Temperature: No Abnormality Tenderness on Palpation: Yes Wound Preparation Ulcer Cleansing: Rinsed/Irrigated with Saline Topical Anesthetic Applied: Other: lidocaine 4%, Treatment Notes Wound #5 (Right Gluteus) 1. Cleansed with: Clean wound with Normal Saline  2. Anesthetic Topical Lidocaine 4% cream to wound bed prior to debridement 3. Peri-wound Care: Skin Prep 4. Dressing Applied: Aquacel Ag 5. Secondary Dressing Applied Bordered Foam Dressing Electronic Signature(s) Signed: 12/19/2016 5:14:29 PM By: Gretta Cool, BSN, RN, CWS, Kim RN, BSN Entered By: Gretta Cool, BSN, RN, CWS, Kim on 12/18/2016 16:09:35 [place], Leslye Peer  ([id number]) -------------------------------------------------------------------------------- Wound Assessment Details Patient Name: NATHYN, LUIZ C. Date of Service: 12/18/2016 3:30 PM Medical Record Number: [id number] Patient Account Number: [id number] Date of Birth/Sex: 1950/04/28 (66 y.o. Male) Treating RN: Cornell Barman Primary Care Damean Poffenberger: Otilio Miu Other Clinician: Referring Treyvone Chelf: Otilio Miu Treating Roger Fasnacht/Extender: Ricard Dillon Weeks in Treatment: 67 Wound Status Wound Number: 6 Primary Pressure Ulcer Etiology: Wound Location: Sacrum - Medial Wound Open Wounding Event: Pressure Injury Status: Date Acquired: 10/16/2016 Comorbid Coronary Artery Disease, Type II Diabetes, Weeks Of Treatment: 8 History: Quadriplegia, Seizure Disorder Clustered Wound: No Photos Photo Uploaded By: Gretta Cool, BSN, RN, CWS, Kim on 12/18/2016 17:25:52 Wound Measurements Length: (cm) 1.3 Width: (cm) 4 Depth: (cm) 0.1 Area: (cm) 4.084 Volume: (cm) 0.408 % Reduction in Area: 80.7% % Reduction in Volume: 80.8% Epithelialization: Small (1-33%) Tunneling: No Undermining: No Wound Description Classification: Category/Stage II Wound Margin: Flat and Intact Exudate Amount: Small Exudate Type: Serous Exudate Color: amber Foul Odor After Cleansing: No Slough/Fibrino No Wound Bed Granulation Amount: None Present (0%) Exposed Structure Necrotic Amount: Large (67-100%) Fascia Exposed: No Necrotic Quality: Eschar Fat Layer (Subcutaneous Tissue) Exposed: No Tendon Exposed: No Muscle Exposed: No Joint Exposed: No Bone Exposed: No Periwound Skin Texture Texture Color No Abnormalities Noted: No No Abnormalities Noted: No Erythema: Yes Moisture [place], Dominyk C. ([id number]) No Abnormalities Noted: No Erythema Location: Circumferential Wound Preparation Ulcer Cleansing: Not Cleansed Topical Anesthetic Applied: None Treatment Notes Wound #6 (Medial Sacrum) 1. Cleansed  with: Clean wound with Normal Saline 2. Anesthetic Topical Lidocaine 4% cream to wound bed prior to debridement 3. Peri-wound Care: Skin Prep 4. Dressing Applied: Aquacel Ag 5. Secondary Dressing Applied Bordered Foam Dressing Electronic Signature(s) Signed: 12/19/2016 5:14:29 PM By: Gretta Cool, BSN, RN, CWS, Kim RN, BSN Entered By: Gretta Cool, BSN, RN, CWS, Kim on 12/18/2016 16:10:36 Fero, Leslye Peer ([id number]) -------------------------------------------------------------------------------- Wound Assessment Details Patient Name: DEERIC, CRUISE C. Date of Service: 12/18/2016 3:30 PM Medical Record Number: [id number] Patient Account Number: [id number] Date of Birth/Sex: May 11, 1950 (66 y.o. Male) Treating RN: Cornell Barman Primary Care Shereka Lafortune: Otilio Miu Other Clinician: Referring Mousa Prout: Otilio Miu Treating Kinsey Karch/Extender: Ricard Dillon Weeks in Treatment: 31 Wound Status Wound Number: 7 Primary Etiology: Pressure Ulcer Wound Location: Right Trochanter Wound Status: Healed - Epithelialized Wounding Event: Pressure Injury Date Acquired: 11/06/2016 Weeks Of Treatment: 4 Clustered Wound: No Photos Photo Uploaded By: Gretta Cool, BSN, RN, CWS, Kim on 12/18/2016 17:26:06 Wound Measurements Length: (cm) 0 Width: (cm) 0 Depth: (cm) 0 Area: (cm) 0 Volume: (cm) 0 % Reduction in Area: 100% % Reduction in Volume: 100% Wound Description Classification: Unstageable/Unclassified Periwound Skin Texture Texture Color No Abnormalities Noted: No No Abnormalities Noted: No Moisture No Abnormalities Noted: No Electronic Signature(s) Signed: 12/19/2016 5:14:29 PM By: Gretta Cool, BSN, RN, CWS, Kim RN, BSN Entered By: Gretta Cool, BSN, RN, CWS, Kim on 12/18/2016 16:04:37 Martian, Leslye Peer ([id number]) -------------------------------------------------------------------------------- Vitals Details Patient Name: [name] Date of Service: 12/18/2016 3:30 PM Medical Record Number:  [id number] Patient Account Number: [id number] Date of Birth/Sex: September 28, 1950 (66 y.o. Male) Treating RN: Cornell Barman Primary Care [place] Nunley: Otilio Miu Other Clinician: Referring Marigny Borre: Otilio Miu Treating [place] Festa/Extender: Ricard Dillon Weeks in Treatment:  47 Vital Signs Time Taken: 15:58 Temperature (F): 98.3 Height (in): 70 Pulse (bpm): 75 Weight (lbs): 187 Respiratory Rate (breaths/min): 16 Body Mass Index (BMI): 26.8 Blood Pressure (mmHg): 136/68 Reference Range: 80 - 120 mg / dl Electronic Signature(s) Signed: 12/19/2016 5:14:29 PM By: Gretta Cool, BSN, RN, CWS, Kim RN, BSN Entered By: Gretta Cool, BSN, RN, CWS, Kim on 12/18/2016 15:58:32

## 2016-12-21 NOTE — Progress Notes (Addendum)
MARKEESE, BOYAJIAN ([id number]) Visit Report for 12/18/2016 Debridement Details Patient Name: [name], [name]. Date of Service: 12/18/2016 3:30 PM Medical Record Number: [id number] Patient Account Number: [id number] Date of Birth/Sex: Dec 16, 1950 (66 y.o. Male) Treating RN: Huel Coventry Primary Care Provider: Elizabeth Sauer Other Clinician: Referring Provider: Elizabeth Sauer Treating Provider/Extender: Altamese [place] in Treatment: 53 Debridement Performed for Wound #6 Medial Sacrum Assessment: Performed By: Physician Maxwell Caul, MD Debridement: Debridement Pre-procedure Verification/Time Yes - 16:21 Out Taken: Start Time: 16:22 Pain Control: Other : lidocaine 4% Level: Skin/Subcutaneous Tissue Total Area Debrided (L x W): 1.3 (cm) x 4 (cm) = 5.2 (cm) Tissue and other material Eschar, Subcutaneous debrided: Instrument: Curette Specimen: Swab Number of Specimens Taken: 1 Bleeding: Moderate Hemostasis Achieved: Pressure End Time: 16:22 Procedural Pain: 3 Post Procedural Pain: 3 Response to Treatment: Procedure was tolerated well Post Debridement Measurements of Total Wound Length: (cm) 1.3 Stage: Category/Stage II Width: (cm) 4 Depth: (cm) 0.2 Volume: (cm) 0.817 Character of Wound/Ulcer Post Requires Further Debridement Debridement: Post Procedure Diagnosis Same as Pre-procedure Electronic Signature(s) Signed: 12/18/2016 4:56:05 PM By: Baltazar Najjar MD Signed: 12/19/2016 5:14:29 PM By: Elliot Gurney, BSN, RN, CWS, Kim RN, BSN Entered By: Baltazar Najjar on 12/18/2016 16:46:32 Reesor, [name] ([id number]) -------------------------------------------------------------------------------- Debridement Details Patient Name: [name], [name] Date of Service: 12/18/2016 3:30 PM Medical Record Number: [id number] Patient Account Number: [id number] Date of Birth/Sex: 1950/05/27 (66 y.o. Male) Treating RN: Huel Coventry Primary Care Provider: Elizabeth Sauer Other  Clinician: Referring Provider: Elizabeth Sauer Treating Provider/Extender: Altamese [place] in Treatment: 47 Debridement Performed for Wound #5 Right Gluteus Assessment: Performed By: Physician Maxwell Caul, MD Debridement: Debridement Pre-procedure Verification/Time Yes - 16:21 Out Taken: Start Time: 16:22 Pain Control: Other : lidocaine 4% Level: Skin/Subcutaneous Tissue/Muscle Total Area Debrided (L x W): 2.8 (cm) x 2 (cm) = 5.6 (cm) Tissue and other material Eschar, Fibrin/Slough, Muscle, Subcutaneous debrided: Instrument: Blade, Curette, Forceps Specimen: Swab Number of Specimens Taken: 1 Bleeding: Moderate Hemostasis Achieved: Pressure End Time: 16:22 Procedural Pain: 3 Post Procedural Pain: 3 Response to Treatment: Procedure was tolerated well Post Debridement Measurements of Total Wound Length: (cm) 2.5 Stage: Category/Stage IV Width: (cm) 3.8 Depth: (cm) 1.2 Volume: (cm) 8.954 Character of Wound/Ulcer Post Requires Further Debridement Debridement: Post Procedure Diagnosis Same as Pre-procedure Electronic Signature(s) Signed: 12/28/2016 10:32:45 AM By: Elliot Gurney, BSN, RN, CWS, Kim RN, BSN Signed: 01/01/2017 12:03:36 PM By: Baltazar Najjar MD Previous Signature: 12/18/2016 4:56:05 PM Version By: Baltazar Najjar MD Previous Signature: 12/19/2016 5:14:29 PM Version By: Elliot Gurney, BSN, RN, CWS, Kim RN, BSN Entered By: Elliot Gurney, BSN, RN, CWS, Kim on 12/28/2016 10:32:44 [name], [name] ([id number]) -------------------------------------------------------------------------------- HPI Details Patient Name: [name], [name] Date of Service: 12/18/2016 3:30 PM Medical Record Number: [id number] Patient Account Number: [id number] Date of Birth/Sex: 1950-07-23 (66 y.o. 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 He has lower extremity sensation and can move his legs but has no/limited control over his arms. His wife accompanies him today and states that in the late spring or early summer of 2017 the patient became very depressed. He refused the refused to mobilize and he developed several pressure ulcers on his back. Most of these  have healed however they have a recalcitrant area over the right scapula. They've been using Santyl on this for at least the last month. In terms of depression the patient is doing better now on an antidepressant. He saw his primary physician on 01/12/16 at which time there was apparently green drainage coming out of this area [Dr. Deana Jones]. Dr. Yetta Barre works in the [facility]. He has completed this Septra. Otherwise looking through [facility] notes that he has a history of seizures. He also had a stroke in 2008 he follows with neurology. He also has type 2 diabetes on Glucophage, hyperlipidemia and gastroesophageal reflux. He takes Plavix for stroke prevention and Keppra for seizure prophylaxis. 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 02/08/16; this is a patient with a pressure ulcer over the right scapula. His wife has been doing the dressing with Santyl and border foam change every second day. When he arrived here 2 weeks ago we did a fairly extensive mechanical debridement. We are asked medical modalities to go out to the home and see what they might be eligible for in terms of pressure-relief surfaces [level 2] but the wife states that they have not heard from them. 03/20/15; this is a patient we haven't seen in 5-6 weeks. This was largely due to transportation issues. They've been using Santyl and  border foam changing every second day for a pressure injury over the right scapula. The patient has a C3-C4 spinal injury. We had also asked for a review by the people who supplied DME to look at his wheelchair cushion, mattress etc. I don't know that this ever happened. In the meantime the wound has done remarkably well current measurements 1.8 x 2 x 0.1 04/03/16; the patient's dimensions have gone up to 3 cm in diameter quite a deterioration from last time. He is also complaining of pain in this area which is new. 04/10/16; 3 x 1.5 x 0.1. No difference from last week. Culture I did last week was negative he has completed antibiotics. 04/24/16 2.4 x 2 x 0.1; wound generally looks smaller. Middle area that I had to debrided last time looks healthier. His son is fashioned a large piece of foam cut out where the patient's wound with hit the back of his wheelchair 05/01/16; wound is a same size however the surface of this looks better. The middle innkeeper area required a repeat debridement 05/15/16; wound is down and dimensions granulation still looks healthy. The medial aspect of this wound is now the deeper Divot. We'll see how this responds to further healing. We're using Hydrofera Blue 06/05/16; Wound 1.7x1.1x0.1 still using hyudrofera blue 06/26/16; the patient arrives back in clinic after a three-week hiatus. He was hospitalized from 06/09/16 through 06/10/16. He was found to be confused. CT scan of the head showed nothing really acute. His sodium was low at 131. He was rehydrated. He was felt to have a UTI and given antibiotics and antibiotics at discharge although his final culture result only showed multiple organisms. His wife says today that at the time of the hospitalization they discovered a large intact blister over the large aspect of his left calcaneus. This is recently ruptured. The wound on his right scapula is somewhat larger. More his wife is concerned about his current status. She states  that he is still confused sleeps for long periods. He is not having fever chills cough or  diarrhea [1 loose bowel movement per day]. He continues to have a suprapubic catheter. She notes that he is not eating and drinking well. Patient states he just does not want to eat. He does not feel nauseated or vomit. 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 Admission sodium was 131 at discharge 139 BUN 22 and 1.22 at admission, 17 and 1 at discharge. 07/03/16; patient's mental status is back to normal. He has a new wound on the right elbow caused by traumatizing his elbow against the wall apparently at the dermatologist office last week. We continue with the original wound on the right scapula and the wound from 2 weeks ago on his left heel. We have been using silver alginate to the area on the heel. [name], Phinehas C. ([id number]) 08/14/16; patient has not been here in almost 6 weeks. When he was here last time he had the pressure ulcer on the right scapula, atraumatic wound on the right lateral elbow and an area on his left heel. His wife states that at one point all of these were healed and she didn't really feel he needed to come back here. Since then the patient has developed a reopening of the area on the right scapula, a stage II wound on the left buttock and a reopening of the area on the right lateral elbow. As usual his wife as a litany of complaints against home health, she is dismissing or is going to dismiss well care. She tells us she has a long list of supplies at home already for some reason they were not felt to be eligible for a group 2 or 3 surface although they have a hospital bed at home but no offloading surface 08/28/16; the patient arrived today unfortunately incontinent of stool in spite of this the wounds all appear to be better including the right scapular area, his left buttock's left heel and the  right lateral elbow. 09/18/16; this is a patient who arrived today for follow-up of a pressure area over his right scapula area, left buttock and there was an area on his right lateral elbow. He arrived in clinic today with a worrisome area over the right initial tuberosity/right buttock. This had a necrotic surface with draining purulence. He has not been systemically unwell. The area over the left elbow healed. He arrived in clinic today with dressing that hadn't been changed over the scapula for 2 or 3 days per her intake. His wife is previously fired home health 09/25/16;; the last time the patient was here he arrived with a new grossly infected wound over the right ischial tuberosity. Culture I did not show a specific pathogen. I did think this required admission to hospital and he was admitted from 09/18/16 through 09/20/16. Initially given vanc and Zosyn but then discharged with 5 days' worth of Augmentin. An MRI was done that did not show osteomyelitis of the right ischial tuberosity however it did show cellulitis and possible myositis. He also has wounds on the left ischial tuberosity and the area over the right scapular area. 10/04/16 on evaluation today patient appears to be doing better in regard to his back wound and is stable in regard to the gluteal wounds. There does not appear to be any evidence of significant infection at this point. He is tolerating the dressing changes currently. His wife has been performing the dressing changes. Nonetheless he does have some discomfort especially  in the gluteal areas although he did not specifically rate the discomfort there were times during evaluation where he flinched and it was obvious it was bothering him. No fevers, chills, nausea, or vomiting noted at this time. 10/17/16; the patient has bilateral ischial tuberosity wounds right greater than left. The left seems to be making good progress wears the right has not really changed that much and  dimensions. There is also some threatened area on the right side around the wound circumference. Equally worrisome there is a DTI over the lower sacral area however this is not open as of yet. His area over the left scapula which is the chronic wound he was coming to the clinic continues to make excellent progress 8/28 The patient comes in today wanting to talk about getting back in his wheelchair. He is very bored lying in bed at home 11/14/16; three-week hiatus for this patient for medical illnesses whether etc. He was in the emergency room at [facility] 2 days ago when his wife noted odor coming out of the right initial tuberosity wound and discoloration. I reviewed his ER presentation from 11/12/16. White count was 11.7 differential count reasonably normal basic metabolic panel was normal. Serum lactate was 1.9. He has a suprapubic catheter which showed positive urine nitrate large leukocytes and many bacteria. Urine cultures come back showing multiple species. Blood culture was negative. Wound culture is still pending. He was put on Keflex and Bactrim. The patient had an MRI of this area on 09/18/16 that did not show osteomyelitis however at that time there was possible cellulitis and infectious myositis no drainable fluid collection. After this he wishes admitted the hospital had vancomycin and Zosyn for 5 days and then 5 days of Augmentin 11/20/16; culture of the wound that I did last week showed multiple organisms. This was the same as from the emergency room as it turns out. He is still on Keflex and Bactrim and completing this. I was really expecting something a little more ominous although he seems to have stabilized. We have been using silver alginate to the major wound on the right ischial tuberosity. He had a DTI over the right greater trochanter, superficial wound over the left ischial tuberosity and a DTI on the superior part of the left hemipelvis. FORTUNATELY everything looks a little  better here than last week. At my suggestion his wife looked into [facility] but was told that he needed a 3 day acute hospital stay. This would be similar to what is required for skilled nursing and I was not specifically aware of this although it may have something to do with the patient's specific insurance 11/27/16; he has completed his antibiotics. All of his wounds appeared to have stabilized. This includes the necrotic right ischial tuberosity wound, DTI on the superior pelvis, DTI over the left greater trochanter. We have been using Aquacel Ag to all wound areas. He arrives in clinic today with a low temperature at first not registering at all then 91.5. After serial checks with different instruments we got this up to 94 although the patient looks fine is mentating normally. His wife reports that this is often the pattern when he goes to the hospital and that they'll put him in a heating blanket and send him home although I don't really see evidence of this on the most recent ER trips. She states she has a warming blanket and a rectal for mom under the registers down to 91. I've asked her to check  his temperature when she goes home. They both seemed very reluctant to go to [facility]. ([id number]) the emergency department over this and to be truthful he looks stable enough to avoid this at least for now 12/03/16; patient arrives with all of his wounds and a considerably better situation. His wife states she has been changing him and turning him. The major remaining wound is in the right ischial tuberosity over even it looks well granulated and the copious amounts of necrotic tissue that I took out several weeks ago with coexistent infection looks to be resolved. We have been using silver alginate to all wounds 12/18/16; patient arrives for his two-week follow-up as usual accompanied by his wife. Unfortunately things have not gone well. They tell me that as of Friday  evening he started to complain of severe pain in the lower buttock area presumably around the wounds although I'm not completely certain of that. This is different for this patient to rarely complains of any pain. They also noted significant odor to the wounds. We have been using silver alginate. He has not had any falls. No fever [ or hypothermia]. He was at his primary doctor in [place] today and had lab work done. Electronic Signature(s) Signed: 12/18/2016 4:56:05 PM By: Baltazar Najjar MD Entered By: Baltazar Najjar on 12/18/2016 16:48:48 [name], [name] ([id number]) -------------------------------------------------------------------------------- Physical Exam Details Patient Name: ALYXANDER, KOLLMANN C. Date of Service: 12/18/2016 3:30 PM Medical Record Number: [id number] Patient Account Number: [id number] Date of Birth/Sex: 1950/06/05 (66 y.o. Male) Treating RN: Huel Coventry Primary Care Provider: Elizabeth Sauer Other Clinician: Referring Provider: Elizabeth Sauer Treating Provider/Extender: Maxwell Caul Weeks in Treatment: 85 Constitutional Sitting or standing Blood Pressure is within target range for patient.. Pulse regular and within target range for patient.Marland Kitchen Respirations regular, non-labored and within target range.. Temperature is normal and within the target range for the patient.Marland Kitchen appears in no distress. He does not look to be medically unstable. Eyes Conjunctivae clear. No discharge. Respiratory Respiratory effort is easy and symmetric bilaterally. Rate is normal at rest and on room air.. Cardiovascular No dehydration. Gastrointestinal (GI) Abdomen is soft and non-distended without masses or tenderness. Bowel sounds active in all quadrants.. No liver or spleen enlargement or tenderness. He has a suprapubic catheter. Genitourinary (GU) . Neurological The patient has sensation to light touch even in his legs. Psychiatric No evidence of depression, anxiety, or agitation.  Calm, cooperative, and communicative. Appropriate interactions and affect.. Notes When exam; he has the lower third of the right initial tuberosity very necrotic and with some odor. Using a #5 curet necrotic material removed including necrotic muscle. Then using pickups and scalpel the debridement of this area was completed again removing necrotic material probably muscle. The wound does not probe to bone there is no surrounding crepitus oBoth the sacral wound and the left initial tuberosity have surface necrotic eschar which I removed from the sacrum but I did not do the left initial tuberosity oI could not clearly localize pain in this man who has had previous cervical spine injury. There was no crepitus no erythema and no warmth Electronic Signature(s) Signed: 12/18/2016 4:56:05 PM By: Baltazar Najjar MD Entered By: Baltazar Najjar on 12/18/2016 16:51:36 [name], [name] ([id number]) -------------------------------------------------------------------------------- Physician Orders Details Patient Name: [name], [name] Date of Service: 12/18/2016 3:30 PM Medical Record Number: [id number] Patient Account Number: [id number] Date of Birth/Sex: 1951/01/16 (66 y.o. Male) Treating RN: Huel Coventry Primary Care Provider: Elizabeth Sauer Other Clinician: Referring Provider: Elizabeth Sauer Treating  Provider/Extender: Altamese [place] in Treatment: 44 Verbal / Phone Orders: No Diagnosis Coding Wound Cleansing Wound #1 Right,Proximal Back o Clean wound with Normal Saline. o Cleanse wound with mild soap and water Wound #4 Left Gluteus o Clean wound with Normal Saline. o Cleanse wound with mild soap and water Wound #5 Right Gluteus o Clean wound with Normal Saline. o Cleanse wound with mild soap and water Wound #6 Medial Sacrum o Clean wound with Normal Saline. o Cleanse wound with mild soap and water Anesthetic Wound #5 Right Gluteus o Topical Lidocaine 4% cream  applied to wound bed prior to debridement Skin Barriers/Peri-Wound Care Wound #1 Right,Proximal Back o Skin Prep Wound #4 Left Gluteus o Skin Prep Wound #5 Right Gluteus o Skin Prep Wound #6 Medial Sacrum o Skin Prep Primary Wound Dressing Wound #1 Right,Proximal Back o Aquacel Ag Wound #4 Left Gluteus o Aquacel Ag Wound #5 Right Gluteus o Aquacel Ag Stalder, Anastasios C. ([id number]) Wound #6 Medial Sacrum o Aquacel Ag Secondary Dressing Wound #1 Right,Proximal Back o Boardered Foam Dressing Wound #4 Left Gluteus o Boardered Foam Dressing Wound #5 Right Gluteus o Boardered Foam Dressing Wound #6 Medial Sacrum o Boardered Foam Dressing Dressing Change Frequency Wound #1 Right,Proximal Back o Change dressing every other day. Wound #4 Left Gluteus o Change dressing every other day. Wound #5 Right Gluteus o Change dressing every other day. Wound #6 Medial Sacrum o Change dressing every other day. Follow-up Appointments Wound #1 Right,Proximal Back o Return Appointment in 1 week. Wound #4 Left Gluteus o Return Appointment in 1 week. Wound #5 Right Gluteus o Return Appointment in 1 week. Wound #6 Medial Sacrum o Return Appointment in 1 week. Off-Loading Wound #1 Right,Proximal Back o Roho cushion for wheelchair o Turn and reposition every 2 hours Wound #4 Left Gluteus o Roho cushion for wheelchair o Turn and reposition every 2 hours Wound #5 Right Gluteus o Roho cushion for wheelchair o Turn and reposition every 2 hours Criado, Eldor C. ([id number]) Wound #6 Medial Sacrum o Roho cushion for wheelchair o Turn and reposition every 2 hours Additional Orders / Instructions Wound #1 Right,Proximal Back o Increase protein intake. Wound #4 Left Gluteus o Increase protein intake. Wound #5 Right Gluteus o Increase protein intake. Wound #6 Medial Sacrum o Increase protein intake. Medications-please add  to medication list. Wound #1 Right,Proximal Back o Other: - Vitamins A, C and Zinc Wound #4 Left Gluteus o Other: - Vitamins A, C and Zinc Wound #5 Right Gluteus o Other: - Vitamins A, C and Zinc Wound #6 Medial Sacrum o Other: - Vitamins A, C and Zinc Laboratory o Bacteria identified in Wound by Culture (MICRO) - right gluteus oooo LOINC Code: 6462-6 oooo Convenience Name: Wound culture routine Radiology o X-ray, other - pelvis (left, right ischial tuberosity) Electronic Signature(s) Signed: 12/18/2016 4:56:05 PM By: Baltazar Najjar MD Signed: 12/19/2016 5:14:29 PM By: Elliot Gurney, BSN, RN, CWS, Kim RN, BSN Entered By: Elliot Gurney, BSN, RN, CWS, Kim on 12/18/2016 16:45:44 [name], [name] ([id number]) -------------------------------------------------------------------------------- Problem List Details Patient Name: [name], MCCAULEY C. Date of Service: 12/18/2016 3:30 PM Medical Record Number: [id number] Patient Account Number: [id number] Date of Birth/Sex: Jan 08, 1951 (66 y.o. Male) Treating RN: Huel Coventry Primary Care Provider: Elizabeth Sauer Other Clinician: Referring Provider: Elizabeth Sauer Treating Provider/Extender: Maxwell Caul Weeks in Treatment: 65 Active Problems ICD-10 Encounter Code Description Active Date Diagnosis 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, stage 3 01/24/2016 Yes S14.103S Unspecified injury at C3 level of cervical  spinal cord, sequela 01/24/2016 Yes S51.011A Laceration without foreign body of right elbow, initial encounter 07/03/2016 Yes L89.322 Pressure ulcer of left buttock, stage 2 08/14/2016 Yes L89.310 Pressure ulcer of right buttock, unstageable 09/18/2016 Yes L03.317 Cellulitis of buttock 09/18/2016 Yes Inactive Problems Resolved Problems ICD-10 Code Description Active Date Resolved Date L89.622 Pressure ulcer of left heel, stage 2 06/26/2016 06/26/2016 Electronic Signature(s) Signed: 12/18/2016 4:56:05 PM By: Baltazar Najjar MD Entered By:  Baltazar Najjar on 12/18/2016 16:45:52 Kuss, [name] ([id number]) -------------------------------------------------------------------------------- Progress Note Details Patient Name: [name] Date of Service: 12/18/2016 3:30 PM Medical Record Number: [id number] Patient Account Number: [id number] Date of Birth/Sex: 1951-02-20 (66 y.o. 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 He has lower extremity sensation and can move his legs but has no/limited control over his arms. His wife accompanies him today and states that in the late spring or early summer of 2017 the patient became very depressed. He refused the refused to mobilize and he developed several pressure ulcers on his back. Most of these have healed however they have a recalcitrant area over the right scapula. They've been using Santyl on this for at least the last month. In terms of depression the patient is doing better now on an antidepressant. He saw his primary physician on 01/12/16 at which time there was apparently green drainage coming out of this area [Dr. Deana Jones]. Dr. Yetta Barre works in the [facility]. He has completed this Septra. Otherwise looking through [facility] notes that he has a history of seizures. He also had a stroke in 2008 he follows with neurology. He also has type 2 diabetes on Glucophage, hyperlipidemia and gastroesophageal reflux. He takes Plavix for stroke prevention and Keppra for seizure prophylaxis. 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 02/08/16; this is a patient  with a pressure ulcer over the right scapula. His wife has been doing the dressing with Santyl and border foam change every second day. When he arrived here 2 weeks ago we did a fairly extensive mechanical debridement. We are asked medical modalities to go out to the home and see what they might be eligible for in terms of pressure-relief surfaces [level 2] but the wife states that they have not heard from them. 03/20/15; this is a patient we haven't seen in 5-6 weeks. This was largely due to transportation issues. They've been using Santyl and border foam changing every second day for a pressure injury over the right scapula. The patient has a C3-C4 spinal injury. We had also asked for a review by the people who supplied DME to look at his wheelchair cushion, mattress etc. I don't know that this ever happened. In the meantime the wound has done remarkably well current measurements 1.8 x 2 x 0.1 04/03/16; the patient's dimensions have gone up to 3 cm in diameter quite a deterioration from last time. He is also complaining of pain in this area which is new. 04/10/16; 3 x 1.5 x 0.1. No difference from last week. Culture I did last week was negative he has completed antibiotics. 04/24/16 2.4 x 2 x 0.1; wound generally  looks smaller. Middle area that I had to debrided last time looks healthier. His son is fashioned a large piece of foam cut out where the patient's wound with hit the back of his wheelchair 05/01/16; wound is a same size however the surface of this looks better. The middle innkeeper area required a repeat debridement 05/15/16; wound is down and dimensions granulation still looks healthy. The medial aspect of this wound is now the deeper Divot. We'll see how this responds to further healing. We're using Hydrofera Blue 06/05/16; Wound 1.7x1.1x0.1 still using hyudrofera blue 06/26/16; the patient arrives back in clinic after a three-week hiatus. He was hospitalized from 06/09/16 through 06/10/16. He  was found to be confused. CT scan of the head showed nothing really acute. His sodium was low at 131. He was rehydrated. He was felt to have a UTI and given antibiotics and antibiotics at discharge although his final culture result only showed multiple organisms. His wife says today that at the time of the hospitalization they discovered a large intact blister over the large aspect of his left calcaneus. This is recently ruptured. The wound on his right scapula is somewhat larger. More his wife is concerned about his current status. She states that he is still confused sleeps for long periods. He is not having fever chills cough or diarrhea [1 loose bowel movement per day]. He continues to have a suprapubic catheter. She notes that he is not eating and drinking well. Patient states he just does not want to eat. He does not feel nauseated or vomit. 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 Admission sodium was 131 at discharge 139 BUN 22 and 1.22 at admission, 17 and 1 at discharge. 07/03/16; patient's mental status is back to normal. He has a new wound on the right elbow caused by traumatizing his elbow against the wall apparently at the dermatologist office last week. We continue with the original wound on the right scapula and Pultz, Jaymere C. ([id number]) the wound from 2 weeks ago on his left heel. We have been using silver alginate to the area on the heel. 08/14/16; patient has not been here in almost 6 weeks. When he was here last time he had the pressure ulcer on the right scapula, atraumatic wound on the right lateral elbow and an area on his left heel. His wife states that at one point all of these were healed and she didn't really feel he needed to come back here. Since then the patient has developed a reopening of the area on the right scapula, a stage II wound on the left buttock and a reopening of the  area on the right lateral elbow. As usual his wife as a litany of complaints against home health, she is dismissing or is going to dismiss well care. She tells [place] she has a long list of supplies at home already for some reason they were not felt to be eligible for a group 2 or 3 surface although they have a hospital bed at home but no offloading surface 08/28/16; the patient arrived today unfortunately incontinent of stool in spite of this the wounds all appear to be better including the right scapular area, his left buttock's left heel and the right lateral elbow. 09/18/16; this is a patient who arrived today for follow-up of a pressure area over his right scapula area, left buttock and there was an area on  his right lateral elbow. He arrived in clinic today with a worrisome area over the right initial tuberosity/right buttock. This had a necrotic surface with draining purulence. He has not been systemically unwell. The area over the left elbow healed. He arrived in clinic today with dressing that hadn't been changed over the scapula for 2 or 3 days per her intake. His wife is previously fired home health 09/25/16;; the last time the patient was here he arrived with a new grossly infected wound over the right ischial tuberosity. Culture I did not show a specific pathogen. I did think this required admission to hospital and he was admitted from 09/18/16 through 09/20/16. Initially given vanc and Zosyn but then discharged with 5 days' worth of Augmentin. An MRI was done that did not show osteomyelitis of the right ischial tuberosity however it did show cellulitis and possible myositis. He also has wounds on the left ischial tuberosity and the area over the right scapular area. 10/04/16 on evaluation today patient appears to be doing better in regard to his back wound and is stable in regard to the gluteal wounds. There does not appear to be any evidence of significant infection at this point. He is  tolerating the dressing changes currently. His wife has been performing the dressing changes. Nonetheless he does have some discomfort especially in the gluteal areas although he did not specifically rate the discomfort there were times during evaluation where he flinched and it was obvious it was bothering him. No fevers, chills, nausea, or vomiting noted at this time. 10/17/16; the patient has bilateral ischial tuberosity wounds right greater than left. The left seems to be making good progress wears the right has not really changed that much and dimensions. There is also some threatened area on the right side around the wound circumference. Equally worrisome there is a DTI over the lower sacral area however this is not open as of yet. His area over the left scapula which is the chronic wound he was coming to the clinic continues to make excellent progress 8/28 The patient comes in today wanting to talk about getting back in his wheelchair. He is very bored lying in bed at home 11/14/16; three-week hiatus for this patient for medical illnesses whether etc. He was in the emergency room at [facility] 2 days ago when his wife noted odor coming out of the right initial tuberosity wound and discoloration. I reviewed his ER presentation from 11/12/16. White count was 11.7 differential count reasonably normal basic metabolic panel was normal. Serum lactate was 1.9. He has a suprapubic catheter which showed positive urine nitrate large leukocytes and many bacteria. Urine cultures come back showing multiple species. Blood culture was negative. Wound culture is still pending. He was put on Keflex and Bactrim. The patient had an MRI of this area on 09/18/16 that did not show osteomyelitis however at that time there was possible cellulitis and infectious myositis no drainable fluid collection. After this he wishes admitted the hospital had vancomycin and Zosyn for 5 days and then 5 days of Augmentin 11/20/16;  culture of the wound that I did last week showed multiple organisms. This was the same as from the emergency room as it turns out. He is still on Keflex and Bactrim and completing this. I was really expecting something a little more ominous although he seems to have stabilized. We have been using silver alginate to the major wound on the right ischial tuberosity. He had a DTI over the right  greater trochanter, superficial wound over the left ischial tuberosity and a DTI on the superior part of the left hemipelvis. FORTUNATELY everything looks a little better here than last week. At my suggestion his wife looked into [facility] but was told that he needed a 3 day acute hospital stay. This would be similar to what is required for skilled nursing and I was not specifically aware of this although it may have something to do with the patient's specific insurance 11/27/16; he has completed his antibiotics. All of his wounds appeared to have stabilized. This includes the necrotic right ischial tuberosity wound, DTI on the superior pelvis, DTI over the left greater trochanter. We have been using Aquacel Ag to all wound areas. He arrives in clinic today with a low temperature at first not registering at all then 91.5. After serial checks with different instruments we got this up to 94 although the patient looks fine is mentating normally. His wife reports that this is often the pattern when he goes to the hospital and that they'll put him in a heating blanket and send him home although I don't really see evidence of this on the most recent ER trips. She states she has a warming blanket and a rectal for mom under the [facility], [name] ([id number]) registers down to 91. I've asked her to check his temperature when she goes home. They both seemed very reluctant to go to the emergency department over this and to be truthful he looks stable enough to avoid this at least for now 12/03/16; patient  arrives with all of his wounds and a considerably better situation. His wife states she has been changing him and turning him. The major remaining wound is in the right ischial tuberosity over even it looks well granulated and the copious amounts of necrotic tissue that I took out several weeks ago with coexistent infection looks to be resolved. We have been using silver alginate to all wounds 12/18/16; patient arrives for his two-week follow-up as usual accompanied by his wife. Unfortunately things have not gone well. They tell me that as of Friday evening he started to complain of severe pain in the lower buttock area presumably around the wounds although I'm not completely certain of that. This is different for this patient to rarely complains of any pain. They also noted significant odor to the wounds. We have been using silver alginate. He has not had any falls. No fever [ or hypothermia]. He was at his primary doctor in [place] today and had lab work done. Objective Constitutional Sitting or standing Blood Pressure is within target range for patient.. Pulse regular and within target range for patient.Marland Kitchen Respirations regular, non-labored and within target range.. Temperature is normal and within the target range for the patient.Marland Kitchen appears in no distress. He does not look to be medically unstable. Vitals Time Taken: 3:58 PM, Height: 70 in, Weight: 187 lbs, BMI: 26.8, Temperature: 98.3 F, Pulse: 75 bpm, Respiratory Rate: 16 breaths/min, Blood Pressure: 136/68 mmHg. Eyes Conjunctivae clear. No discharge. Respiratory Respiratory effort is easy and symmetric bilaterally. Rate is normal at rest and on room air.. Cardiovascular No dehydration. Gastrointestinal (GI) Abdomen is soft and non-distended without masses or tenderness. Bowel sounds active in all quadrants.. No liver or spleen enlargement or tenderness. He has a suprapubic catheter. Neurological The patient has sensation to light touch  even in his legs. Psychiatric No evidence of depression, anxiety, or agitation. Calm, cooperative, and communicative. Appropriate interactions and affect.. General  Notes: When exam; he has the lower third of the right initial tuberosity very necrotic and with some odor. Using a #5 curet necrotic material removed including necrotic muscle. Then using pickups and scalpel the debridement of this area was completed again removing necrotic material probably muscle. The wound does not probe to bone there is no surrounding crepitus Both the sacral wound and the left initial tuberosity have surface necrotic eschar which I removed from the sacrum but I did not do the left initial tuberosity I could not clearly localize pain in this man who has had previous cervical spine injury. There was no crepitus no erythema and no warmth Integumentary (Hair, Skin) Armendariz, [name] ([id number]) Wound #1 status is Open. Original cause of wound was Pressure Injury. The wound is located on the Right,Proximal Back. The wound measures 0.2cm length x 0.2cm width x 0.1cm depth; 0.031cm^2 area and 0.003cm^3 volume. The wound is limited to skin breakdown. There is no tunneling or undermining noted. There is a none present amount of drainage noted. The wound margin is flat and intact. There is no granulation within the wound bed. There is no necrotic tissue within the wound bed. Wound #4 status is Open. Original cause of wound was Pressure Injury. The wound is located on the Left Gluteus. The wound measures 2cm length x 1.8cm width x 0.1cm depth; 2.827cm^2 area and 0.283cm^3 volume. There is no tunneling or undermining noted. There is a medium amount of serosanguineous drainage noted. The wound margin is flat and intact. There is no granulation within the wound bed. There is a large (67-100%) amount of necrotic tissue within the wound bed including Eschar. Wound #5 status is Open. Original cause of wound was Pressure Injury.  The wound is located on the Right Gluteus. The wound measures 2.5cm length x 3.8cm width x 1cm depth; 7.461cm^2 area and 7.461cm^3 volume. There is muscle and Fat Layer (Subcutaneous Tissue) Exposed exposed. There is no tunneling noted, however, there is undermining starting at 6:00 and ending at 10:00 with a maximum distance of 0.8cm. There is a large amount of serous drainage noted. Foul odor after cleansing was noted. The wound margin is flat and intact. There is medium (34-66%) red granulation within the wound bed. There is a medium (34-66%) amount of necrotic tissue within the wound bed including Adherent Slough. The periwound skin appearance exhibited: Induration, Erythema. The surrounding wound skin color is noted with erythema which is circumferential. Periwound temperature was noted as No Abnormality. The periwound has tenderness on palpation. Wound #6 status is Open. Original cause of wound was Pressure Injury. The wound is located on the Medial Sacrum. The wound measures 1.3cm length x 4cm width x 0.1cm depth; 4.084cm^2 area and 0.408cm^3 volume. There is no tunneling or undermining noted. There is a small amount of serous drainage noted. The wound margin is flat and intact. There is no granulation within the wound bed. There is a large (67-100%) amount of necrotic tissue within the wound bed including Eschar. The periwound skin appearance exhibited: Erythema. The surrounding wound skin color is noted with erythema which is circumferential. Wound #7 status is Healed - Epithelialized. Original cause of wound was Pressure Injury. The wound is located on the Right Trochanter. The wound measures 0cm length x 0cm width x 0cm depth; 0cm^2 area and 0cm^3 volume. 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, stage 3 S14.103S - Unspecified injury at C3 level of cervical spinal cord, sequela S51.011A - Laceration without  foreign body of right elbow, initial  encounter L89.322 - Pressure ulcer of left buttock, stage 2 L89.310 - Pressure ulcer of right buttock, unstageable L03.317 - Cellulitis of buttock Procedures Wound #5 Pre-procedure diagnosis of Wound #5 is a Pressure Ulcer located on the Right Gluteus . There was a Skin/Subcutaneous Tissue/Muscle Debridement ([id number]) debridement with total area of 5.6 sq cm performed by Maxwell Caul, MD. with the following instrument(s): Blade, Curette, and Forceps including Fibrin/Slough, Muscle, Eschar, and Subcutaneous after achieving pain control using Other (lidocaine 4%). 1 Specimen was taken by a Swab and sent to the lab per facility [facility], Johnpaul C. ([id number]) protocol.A time out was conducted at 16:21, prior to the start of the procedure. A Moderate amount of bleeding was controlled with Pressure. The procedure was tolerated well with a pain level of 3 throughout and a pain level of 3 following the procedure. Post Debridement Measurements: 2.5cm length x 3.8cm width x 1.2cm depth; 8.954cm^3 volume. Post debridement Stage noted as Category/Stage IV. Character of Wound/Ulcer Post Debridement requires further debridement. Post procedure Diagnosis Wound #5: Same as Pre-Procedure Wound #6 Pre-procedure diagnosis of Wound #6 is a Pressure Ulcer located on the Medial Sacrum . There was a Skin/Subcutaneous Tissue Debridement ([id number]) debridement with total area of 5.2 sq cm performed by Maxwell Caul, MD. with the following instrument(s): Curette including Eschar and Subcutaneous after achieving pain control using Other (lidocaine 4%). 1 Specimen was taken by a Swab and sent to the lab per facility protocol.A time out was conducted at 16:21, prior to the start of the procedure. A Moderate amount of bleeding was controlled with Pressure. The procedure was tolerated well with a pain level of 3 throughout and a pain level of 3 following the procedure. Post Debridement Measurements: 1.3cm  length x 4cm width x 0.2cm depth; 0.817cm^3 volume. Post debridement Stage noted as Category/Stage II. Character of Wound/Ulcer Post Debridement requires further debridement. Post procedure Diagnosis Wound #6: Same as Pre-Procedure Plan Wound Cleansing: Wound #1 Right,Proximal Back: Clean wound with Normal Saline. Cleanse wound with mild soap and water Wound #4 Left Gluteus: Clean wound with Normal Saline. Cleanse wound with mild soap and water Wound #5 Right Gluteus: Clean wound with Normal Saline. Cleanse wound with mild soap and water Wound #6 Medial Sacrum: Clean wound with Normal Saline. Cleanse wound with mild soap and water Anesthetic: Wound #5 Right Gluteus: Topical Lidocaine 4% cream applied to wound bed prior to debridement Skin Barriers/Peri-Wound Care: Wound #1 Right,Proximal Back: Skin Prep Wound #4 Left Gluteus: Skin Prep Wound #5 Right Gluteus: Skin Prep Wound #6 Medial Sacrum: Skin Prep Primary Wound Dressing: Wound #1 Right,Proximal Back: Aquacel Ag Wound #4 Left Gluteus: Aquacel Ag Wound #5 Right Gluteus: Aquacel Ag Wound #6 Medial Sacrum: Schickling, [name] ([id number]) Aquacel Ag Secondary Dressing: Wound #1 Right,Proximal Back: Boardered Foam Dressing Wound #4 Left Gluteus: Boardered Foam Dressing Wound #5 Right Gluteus: Boardered Foam Dressing Wound #6 Medial Sacrum: Boardered Foam Dressing Dressing Change Frequency: Wound #1 Right,Proximal Back: Change dressing every other day. Wound #4 Left Gluteus: Change dressing every other day. Wound #5 Right Gluteus: Change dressing every other day. Wound #6 Medial Sacrum: Change dressing every other day. Follow-up Appointments: Wound #1 Right,Proximal Back: Return Appointment in 1 week. Wound #4 Left Gluteus: Return Appointment in 1 week. Wound #5 Right Gluteus: Return Appointment in 1 week. Wound #6 Medial Sacrum: Return Appointment in 1 week. Off-Loading: Wound #1 Right,Proximal  Back: Roho cushion for wheelchair Turn and reposition  every 2 hours Wound #4 Left Gluteus: Roho cushion for wheelchair Turn and reposition every 2 hours Wound #5 Right Gluteus: Roho cushion for wheelchair Turn and reposition every 2 hours Wound #6 Medial Sacrum: Roho cushion for wheelchair Turn and reposition every 2 hours Additional Orders / Instructions: Wound #1 Right,Proximal Back: Increase protein intake. Wound #4 Left Gluteus: Increase protein intake. Wound #5 Right Gluteus: Increase protein intake. Wound #6 Medial Sacrum: Increase protein intake. Medications-please add to medication list.: Wound #1 Right,Proximal Back: Other: - Vitamins A, C and Zinc Wound #4 Left Gluteus: Other: - Vitamins A, C and Zinc Wound #5 Right Gluteus: Other: - Vitamins A, C and Zinc Wound #6 Medial Sacrum: Other: - Vitamins A, C and Zinc Radiology ordered were: MAKAR, SLATTER ([id number]) X-ray, other - pelvis (left, right ischial tuberosity) Laboratory ordered were: Wound culture routine - right gluteus #1 there is been a major deterioration of the wound on the right initial tuberosity. Extensive debridement done here. Post- debridement cultured taken. This does not probe to bone which I thought it might when I started the debridement. #2 we will continue silver alginate in this wound and the other wounds as well for now #3 is pain complaint is vexing. It is tempting to blame the wounds for this and if so it would be the right initial tuberosity possibly osteomyelitis in this area. I have asked for a plain x-ray of the pelvis although he may require more thorough studies. Presumably not a candidate for an MRI I think because of retained metal #4 all see if I can look at the lab work ordered by Dr. Yetta Barre and medical management especially his white count and differential. #5 the cause of his acute pain is not completely clear to me. The usual things including fractures of the pelvis,  possibly infections. I would wonder if this is some form of radiating pain relating to his spinal cord injury. Interestingly it seems worse at night. I'm not able to order narcotics in this clinic. Dr. Yetta Barre correctly pointed out that Ultram causes lowered seizure threshold. Electronic Signature(s) Signed: 01/10/2017 3:15:54 PM By: Elliot Gurney, BSN, RN, CWS, Kim RN, BSN Signed: 01/16/2017 8:07:46 AM By: Baltazar Najjar MD Previous Signature: 12/18/2016 4:56:05 PM Version By: Baltazar Najjar MD Entered By: Elliot Gurney, BSN, RN, CWS, Kim on 01/10/2017 15:15:54 Cassel, [name] ([id number]) -------------------------------------------------------------------------------- SuperBill Details Patient Name: SINCLAIR, ALLIGOOD. Date of Service: 12/18/2016 Medical Record Number: [id number] Patient Account Number: [id number] Date of Birth/Sex: Jun 17, 1950 (66 y.o. Male) Treating RN: Huel Coventry Primary Care Provider: Elizabeth Sauer Other Clinician: Referring Provider: Elizabeth Sauer Treating Provider/Extender: Maxwell Caul Weeks in Treatment: 9 Diagnosis Coding ICD-10 Codes Code Description [phone] Pressure ulcer of other site, stage 3 S14.103S Unspecified injury at C3 level of cervical spinal cord, sequela S51.011A Laceration without foreign body of right elbow, initial encounter L89.322 Pressure ulcer of left buttock, stage 2 L89.310 Pressure ulcer of right buttock, unstageable L03.317 Cellulitis of buttock Facility Procedures CPT4 Code: [id number] Description: 11043 - DEB MUSC/FASCIA 20 SQ CM/< ICD-10 Diagnosis Description L89.310 Pressure ulcer of right buttock, unstageable Modifier: Quantity: 1 Physician Procedures CPT4 Code: [id number] Description: 11043 - WC PHYS DEBR MUSCLE/FASCIA 20 SQ CM ICD-10 Diagnosis Description 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

## 2016-12-23 LAB — AEROBIC CULTURE W GRAM STAIN (SUPERFICIAL SPECIMEN)

## 2016-12-23 LAB — AEROBIC CULTURE  (SUPERFICIAL SPECIMEN)

## 2016-12-24 ENCOUNTER — Telehealth: Payer: Self-pay

## 2016-12-24 ENCOUNTER — Other Ambulatory Visit: Payer: Self-pay

## 2016-12-24 MED ORDER — HYDROCODONE-ACETAMINOPHEN 5-325 MG PO TABS: 1.0000 | ORAL_TABLET | Freq: Three times a day (TID) | ORAL | 0 refills | Status: DC | PRN

## 2016-12-24 NOTE — Telephone Encounter (Signed)
-----   Message from Duanne Limerickeanna C Jones, MD sent at 12/24/2016  6:09 AM EDT ----- Mild anemia from chronic disease /encouage healthy diet/ recheck hgb in 3 months

## 2016-12-24 NOTE — Telephone Encounter (Signed)
Pt's wife called to say pt had been to wound care and needed pain med- printed off 3 day script

## 2016-12-26 ENCOUNTER — Encounter: Payer: Medicare Other | Admitting: Internal Medicine

## 2016-12-26 DIAGNOSIS — L03317 Cellulitis of buttock: Secondary | ICD-10-CM | POA: Diagnosis not present

## 2016-12-26 DIAGNOSIS — L89322 Pressure ulcer of left buttock, stage 2: Secondary | ICD-10-CM | POA: Diagnosis not present

## 2016-12-26 DIAGNOSIS — L89119 Pressure ulcer of right upper back, unspecified stage: Secondary | ICD-10-CM | POA: Diagnosis not present

## 2016-12-26 DIAGNOSIS — L89310 Pressure ulcer of right buttock, unstageable: Secondary | ICD-10-CM | POA: Diagnosis not present

## 2016-12-26 DIAGNOSIS — S14103S Unspecified injury at C3 level of cervical spinal cord, sequela: Secondary | ICD-10-CM | POA: Diagnosis not present

## 2016-12-26 DIAGNOSIS — Z7984 Long term (current) use of oral hypoglycemic drugs: Secondary | ICD-10-CM | POA: Diagnosis not present

## 2016-12-26 DIAGNOSIS — S31819A Unspecified open wound of right buttock, initial encounter: Secondary | ICD-10-CM | POA: Diagnosis not present

## 2016-12-26 DIAGNOSIS — E119 Type 2 diabetes mellitus without complications: Secondary | ICD-10-CM | POA: Diagnosis not present

## 2016-12-26 DIAGNOSIS — S51011A Laceration without foreign body of right elbow, initial encounter: Secondary | ICD-10-CM | POA: Diagnosis not present

## 2016-12-26 DIAGNOSIS — I959 Hypotension, unspecified: Secondary | ICD-10-CM | POA: Diagnosis not present

## 2016-12-26 DIAGNOSIS — S31000A Unspecified open wound of lower back and pelvis without penetration into retroperitoneum, initial encounter: Secondary | ICD-10-CM | POA: Diagnosis not present

## 2016-12-26 DIAGNOSIS — Z8673 Personal history of transient ischemic attack (TIA), and cerebral infarction without residual deficits: Secondary | ICD-10-CM | POA: Diagnosis not present

## 2016-12-26 DIAGNOSIS — K219 Gastro-esophageal reflux disease without esophagitis: Secondary | ICD-10-CM | POA: Diagnosis not present

## 2016-12-26 DIAGNOSIS — R569 Unspecified convulsions: Secondary | ICD-10-CM | POA: Diagnosis not present

## 2016-12-26 DIAGNOSIS — L89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, stage 3: Secondary | ICD-10-CM | POA: Diagnosis not present

## 2016-12-26 DIAGNOSIS — S31829A Unspecified open wound of left buttock, initial encounter: Secondary | ICD-10-CM | POA: Diagnosis not present

## 2016-12-26 DIAGNOSIS — G8252 Quadriplegia, C1-C4 incomplete: Secondary | ICD-10-CM | POA: Diagnosis not present

## 2016-12-26 DIAGNOSIS — S71001A Unspecified open wound, right hip, initial encounter: Secondary | ICD-10-CM | POA: Diagnosis not present

## 2016-12-27 ENCOUNTER — Other Ambulatory Visit: Payer: Self-pay | Admitting: Internal Medicine

## 2016-12-27 ENCOUNTER — Ambulatory Visit
Admission: RE | Admit: 2016-12-27 | Discharge: 2016-12-27 | Disposition: A | Discharge status: Home / Self Care | Payer: Medicare Other | Source: Ambulatory Visit | Attending: Internal Medicine | Admitting: Internal Medicine

## 2016-12-27 DIAGNOSIS — M47896 Other spondylosis, lumbar region: Secondary | ICD-10-CM | POA: Insufficient documentation

## 2016-12-27 DIAGNOSIS — I739 Peripheral vascular disease, unspecified: Secondary | ICD-10-CM | POA: Diagnosis not present

## 2016-12-27 DIAGNOSIS — R102 Pelvic and perineal pain: Secondary | ICD-10-CM | POA: Diagnosis not present

## 2016-12-27 DIAGNOSIS — L89130 Pressure ulcer of right lower back, unstageable: Secondary | ICD-10-CM | POA: Diagnosis not present

## 2016-12-27 DIAGNOSIS — M160 Bilateral primary osteoarthritis of hip: Secondary | ICD-10-CM | POA: Insufficient documentation

## 2016-12-27 DIAGNOSIS — B999 Unspecified infectious disease: Secondary | ICD-10-CM

## 2016-12-29 NOTE — Progress Notes (Signed)
[name], [name] ([id number]) Visit Report for 12/26/2016 HPI Details Patient Name: [name], [name]. Date of Service: 12/26/2016 3:30 PM Medical Record Number: [id number] Patient Account Number: [id number] Date of Birth/Sex: 09/01/1950 (66 y.o. 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 He has lower extremity sensation and can move his legs but has no/limited control over his arms. His wife accompanies him today and states that in the late spring or early summer of 2017 the patient became very depressed. He refused the refused to mobilize and he developed several pressure ulcers on his back. Most of these have healed however they have a recalcitrant area over the right scapula. They've been using Santyl on this for at least the last month. In terms of depression the patient is doing better now on an antidepressant. He saw his primary physician on 01/12/16 at which time there was apparently green drainage coming out of this area [Dr. Deana Jones]. Dr. Yetta Barre works in the [facility]. He has completed this Septra. Otherwise looking through [facility] notes that he has a history of seizures. He also had a stroke in 2008 he follows with neurology. He also has type 2 diabetes on Glucophage, hyperlipidemia and gastroesophageal reflux. He takes Plavix for stroke prevention and Keppra for seizure prophylaxis. 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 02/08/16; this is a patient with a pressure ulcer over the right scapula. His wife has been doing the dressing with Santyl  and border foam change every second day. When he arrived here 2 weeks ago we did a fairly extensive mechanical debridement. We are asked medical modalities to go out to the home and see what they might be eligible for in terms of pressure-relief surfaces [level 2] but the wife states that they have not heard from them. 03/20/15; this is a patient we haven't seen in 5-6 weeks. This was largely due to transportation issues. They've been using Santyl and border foam changing every second day for a pressure injury over the right scapula. The patient has a C3-C4 spinal injury. We had also asked for a review by the people who supplied DME to look at his wheelchair cushion, mattress etc. I don't know that this ever happened. In the meantime the wound has done remarkably well current measurements 1.8 x 2 x 0.1 04/03/16; the patient's dimensions have gone up to 3 cm in diameter quite a deterioration from last time. He is also complaining of pain in this area which is new. 04/10/16; 3 x 1.5 x 0.1. No difference from last week. Culture I did last week was negative he has completed antibiotics. 04/24/16 2.4 x 2 x 0.1; wound generally looks smaller. Middle area that I had to debrided last time looks healthier. His son is fashioned a large piece of foam cut out where the patient's wound with hit the back of his wheelchair 05/01/16; wound is a same size however the surface of this looks better. The middle innkeeper area required a repeat debridement 05/15/16; wound is down and dimensions granulation still looks healthy. The medial aspect of this wound is now the deeper Divot. We'll see  how this responds to further healing. We're using Hydrofera Blue 06/05/16; Wound 1.7x1.1x0.1 still using hyudrofera blue 06/26/16; the patient arrives back in clinic after a three-week hiatus. He was hospitalized from 06/09/16 through 06/10/16. He was found to be confused. CT scan of the head showed nothing really acute. His sodium was low at  131. He was rehydrated. He was felt to have a UTI and given antibiotics and antibiotics at discharge although his final culture result only showed multiple organisms. His wife says today that at the time of the hospitalization they discovered a large intact blister over the large aspect of his left calcaneus. This is recently ruptured. The wound on his right scapula is somewhat larger. More his wife is concerned about his current status. She states that he is still confused sleeps for long periods. He is not having fever chills cough or diarrhea [1 loose bowel movement per day]. He continues to have a suprapubic catheter. She notes that he is not eating and drinking well. Patient states he just does not want to eat. He does not feel nauseated or vomit. 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 Admission sodium was 131 at discharge 139 BUN 22 and 1.22 at admission, 17 and 1 at discharge. [name], [name] ([id number]) 07/03/16; patient's mental status is back to normal. He has a new wound on the right elbow caused by traumatizing his elbow against the wall apparently at the dermatologist office last week. We continue with the original wound on the right scapula and the wound from 2 weeks ago on his left heel. We have been using silver alginate to the area on the heel. 08/14/16; patient has not been here in almost 6 weeks. When he was here last time he had the pressure ulcer on the right scapula, atraumatic wound on the right lateral elbow and an area on his left heel. His wife states that at one point all of these were healed and she didn't really feel he needed to come back here. Since then the patient has developed a reopening of the area on the right scapula, a stage II wound on the left buttock and a reopening of the area on the right lateral elbow. As usual his wife as a litany of complaints against home health,  she is dismissing or is going to dismiss well care. She tells [place] she has a long list of supplies at home already for some reason they were not felt to be eligible for a group 2 or 3 surface although they have a hospital bed at home but no offloading surface 08/28/16; the patient arrived today unfortunately incontinent of stool in spite of this the wounds all appear to be better including the right scapular area, his left buttock's left heel and the right lateral elbow. 09/18/16; this is a patient who arrived today for follow-up of a pressure area over his right scapula area, left buttock and there was an area on his right lateral elbow. He arrived in clinic today with a worrisome area over the right initial tuberosity/right buttock. This had a necrotic surface with draining purulence. He has not been systemically unwell. The area over the left elbow healed. He arrived in clinic today with dressing that hadn't been changed over the scapula for 2 or 3 days per her intake. His wife is previously fired home health 09/25/16;; the last time the patient was here he arrived  with a new grossly infected wound over the right ischial tuberosity. Culture I did not show a specific pathogen. I did think this required admission to hospital and he was admitted from 09/18/16 through 09/20/16. Initially given vanc and Zosyn but then discharged with 5 days' worth of Augmentin. An MRI was done that did not show osteomyelitis of the right ischial tuberosity however it did show cellulitis and possible myositis. He also has wounds on the left ischial tuberosity and the area over the right scapular area. 10/04/16 on evaluation today patient appears to be doing better in regard to his back wound and is stable in regard to the gluteal wounds. There does not appear to be any evidence of significant infection at this point. He is tolerating the dressing changes currently. His wife has been performing the dressing changes. Nonetheless  he does have some discomfort especially in the gluteal areas although he did not specifically rate the discomfort there were times during evaluation where he flinched and it was obvious it was bothering him. No fevers, chills, nausea, or vomiting noted at this time. 10/17/16; the patient has bilateral ischial tuberosity wounds right greater than left. The left seems to be making good progress wears the right has not really changed that much and dimensions. There is also some threatened area on the right side around the wound circumference. Equally worrisome there is a DTI over the lower sacral area however this is not open as of yet. His area over the left scapula which is the chronic wound he was coming to the clinic continues to make excellent progress 8/28 The patient comes in today wanting to talk about getting back in his wheelchair. He is very bored lying in bed at home 11/14/16; three-week hiatus for this patient for medical illnesses whether etc. He was in the emergency room at [facility] 2 days ago when his wife noted odor coming out of the right initial tuberosity wound and discoloration. I reviewed his ER presentation from 11/12/16. White count was 11.7 differential count reasonably normal basic metabolic panel was normal. Serum lactate was 1.9. He has a suprapubic catheter which showed positive urine nitrate large leukocytes and many bacteria. Urine cultures come back showing multiple species. Blood culture was negative. Wound culture is still pending. He was put on Keflex and Bactrim. The patient had an MRI of this area on 09/18/16 that did not show osteomyelitis however at that time there was possible cellulitis and infectious myositis no drainable fluid collection. After this he wishes admitted the hospital had vancomycin and Zosyn for 5 days and then 5 days of Augmentin 11/20/16; culture of the wound that I did last week showed multiple organisms. This was the same as from the  emergency room as it turns out. He is still on Keflex and Bactrim and completing this. I was really expecting something a little more ominous although he seems to have stabilized. We have been using silver alginate to the major wound on the right ischial tuberosity. He had a DTI over the right greater trochanter, superficial wound over the left ischial tuberosity and a DTI on the superior part of the left hemipelvis. FORTUNATELY everything looks a little better here than last week. At my suggestion his wife looked into [facility] but was told that he needed a 3 day acute hospital stay. This would be similar to what is required for skilled nursing and I was not specifically aware of this although it may have something to do with  the patient's specific insurance 11/27/16; he has completed his antibiotics. All of his wounds appeared to have stabilized. This includes the necrotic right ischial tuberosity wound, DTI on the superior pelvis, DTI over the left greater trochanter. We have been using Aquacel Ag to all wound areas. He arrives in clinic today with a low temperature at first not registering at all then 91.5. After serial checks with different instruments we got this up to 94 although the patient looks fine is mentating normally. His wife reports that this is often the [facility] ([id number]) pattern when he goes to the hospital and that they'll put him in a heating blanket and send him home although I don't really see evidence of this on the most recent ER trips. She states she has a warming blanket and a rectal for mom under the registers down to 91. I've asked her to check his temperature when she goes home. They both seemed very reluctant to go to the emergency department over this and to be truthful he looks stable enough to avoid this at least for now 12/03/16; patient arrives with all of his wounds and a considerably better situation. His wife states she has been  changing him and turning him. The major remaining wound is in the right ischial tuberosity over even it looks well granulated and the copious amounts of necrotic tissue that I took out several weeks ago with coexistent infection looks to be resolved. We have been using silver alginate to all wounds 12/18/16; patient arrives for his two-week follow-up as usual accompanied by his wife. Unfortunately things have not gone well. They tell me that as of Friday evening he started to complain of severe pain in the lower buttock area presumably around the wounds although I'm not completely certain of that. This is different for this patient to rarely complains of any pain. They also noted significant odor to the wounds. We have been using silver alginate. He has not had any falls. No fever [ or hypothermia]. He was at his primary doctor in [place] today and had lab work done. 12/18/16; patient's deep tissue culture from last week grew Proteus and MRSA. He started Septra DS and Augmentin late 2 days ago. He is not spontaneously complaining of as much pain as last week although the patient's wife states when she changes the dressing he complains of pain. He apparently refused to go and have x-rays of the pelvis done to look at the right ischial tuberosity specifically. We have been using silver alginate largely because of the necrotic wound in the lower part of the right ischial tuberosity. He has not been systemically unwell Electronic Signature(s) Signed: 12/26/2016 4:32:10 PM By: Baltazar Najjar MD Entered By: Baltazar Najjar on 12/26/2016 16:24:21 [place]oneyman, [name] ([id number]) -------------------------------------------------------------------------------- Physical Exam Details Patient Name: Odem, Oshay C. Date of Service: 12/26/2016 3:30 PM Medical Record Number: [id number] Patient Account Number: [id number] Date of Birth/Sex: 1950-05-02 (66 y.o. Male) Treating RN: Huel Coventry Primary Care  Provider: Elizabeth Sauer Other Clinician: Referring Provider: Elizabeth Sauer Treating Provider/Extender: Maxwell Caul Weeks in Treatment: 58 Constitutional Patient is hypertensive.. Pulse regular and within target range for patient.Marland Kitchen Respirations regular, non-labored and within target range.. Temperature is normal and within the target range for the patient.Marland Kitchen appears in no distress. Eyes Conjunctivae clear. No discharge. Respiratory Respiratory effort is easy and symmetric bilaterally. Rate is normal at rest and on room air.. Bilateral breath sounds are clear and equal in all lobes with no wheezes, rales  or rhonchi.. Cardiovascular Heart rhythm and rate regular, without murmur or gallop.. Gastrointestinal (GI) Abdomen is soft and non-distended without masses or tenderness. Bowel sounds active in all quadrants.. No liver or spleen enlargement or tenderness.. Genitourinary (GU) Suprapubic catheter. Integumentary (Hair, Skin) No systemic rash. Psychiatric Somewhat of a depressed affect. Notes Wound exam; the lower third of the right ischial tuberosity wound remains necrotic with darkening of the circumference of this wound around this area. There is less necrotic material than last week. No redness and no crepitus around the wound nevertheless is remains very worrisome for underlying ischial tuberosity oHe has eschar over the area on the lower sacrum and the left ischial tuberosity none of these appear to be particularly active no debridement here either Electronic Signature(s) Signed: 12/26/2016 4:32:10 PM By: Baltazar Najjar MD Entered By: Baltazar Najjar on 12/26/2016 16:28:37 Gamm, [name] ([id number]) -------------------------------------------------------------------------------- Physician Orders Details Patient Name: [name] C. Date of Service: 12/26/2016 3:30 PM Medical Record Number: [id number] Patient Account Number: [id number] Date of Birth/Sex: 20-Sep-1950 (66  y.o. Male) Treating RN: Huel Coventry Primary Care Provider: Elizabeth Sauer Other Clinician: Referring Provider: Elizabeth Sauer Treating Provider/Extender: [place] in Treatment: 59 Verbal / Phone Orders: No Diagnosis Coding Wound Cleansing Wound #4 Left Gluteus o Clean wound with Normal Saline. o Cleanse wound with mild soap and water Wound #5 Right Gluteus o Clean wound with Normal Saline. o Cleanse wound with mild soap and water Wound #6 Medial Sacrum o Clean wound with Normal Saline. o Cleanse wound with mild soap and water Anesthetic Wound #5 Right Gluteus o Topical Lidocaine 4% cream applied to wound bed prior to debridement Skin Barriers/Peri-Wound Care Wound #4 Left Gluteus o Skin Prep Wound #5 Right Gluteus o Skin Prep Wound #6 Medial Sacrum o Skin Prep Primary Wound Dressing Wound #4 Left Gluteus o Aquacel Ag Wound #5 Right Gluteus o Aquacel Ag Wound #6 Medial Sacrum o Aquacel Ag Secondary Dressing Wound #4 Left Gluteus o Boardered Foam Dressing Wound #5 Right Gluteus o Boardered Foam Dressing Toohey, [name] ([id number]) Wound #6 Medial Sacrum o Boardered Foam Dressing Dressing Change Frequency Wound #4 Left Gluteus o Change dressing every other day. Wound #5 Right Gluteus o Change dressing every other day. Wound #6 Medial Sacrum o Change dressing every other day. Follow-up Appointments Wound #4 Left Gluteus o Return Appointment in 1 week. Wound #5 Right Gluteus o Return Appointment in 1 week. Wound #6 Medial Sacrum o Return Appointment in 1 week. Off-Loading Wound #4 Left Gluteus o Roho cushion for wheelchair o Turn and reposition every 2 hours Wound #5 Right Gluteus o Roho cushion for wheelchair o Turn and reposition every 2 hours Wound #6 Medial Sacrum o Roho cushion for wheelchair o Turn and reposition every 2 hours Additional Orders / Instructions Wound #4 Left  Gluteus o Increase protein intake. Wound #5 Right Gluteus o Increase protein intake. Wound #6 Medial Sacrum o Increase protein intake. Medications-please add to medication list. Wound #4 Left Gluteus o Other: - Vitamins A, C and Zinc Wound #5 Right Gluteus o Other: - Vitamins A, C and Zinc Wound #6 Medial Sacrum [name], [name] ([id number]) o Other: - Vitamins A, C and Zinc Electronic Signature(s) Signed: 12/26/2016 4:32:10 PM By: Baltazar Najjar MD Signed: 12/27/2016 5:34:16 PM By: Elliot Gurney, BSN, RN, CWS, Kim RN, BSN Entered By: Elliot Gurney, BSN, RN, CWS, Kim on 12/26/2016 15:59:56 Tiznado, [name] ([id number]) -------------------------------------------------------------------------------- Progress Note Details Patient Name: [name], [name] Date of Service: 12/26/2016  3:30 PM Medical Record Number: [id number] Patient Account Number: [id number] Date of Birth/Sex: 05-26-1950 (66 y.o. 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 He has lower extremity sensation and can move his legs but has no/limited control over his arms. His wife accompanies him today and states that in the late spring or early summer of 2017 the patient became very depressed. He refused the refused to mobilize and he developed several pressure ulcers on his back. Most of these have healed however they have a recalcitrant area over the right scapula. They've been using Santyl on this for at least the last month. In terms of depression the patient is doing better now on an antidepressant. He saw his primary physician on 01/12/16 at which time there was apparently green drainage coming  out of this area [Dr. Deana Jones]. Dr. Yetta Barre works in the [facility]. He has completed this Septra. Otherwise looking [facility] notes that he has a history of seizures. He also had a stroke in 2008 he follows with neurology. He also has type 2 diabetes on Glucophage, hyperlipidemia and gastroesophageal reflux. He takes Plavix for stroke prevention and Keppra for seizure prophylaxis. 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 02/08/16; this is a patient with a pressure ulcer over the right scapula. His wife has been doing the dressing with Santyl and border foam change every second day. When he arrived here 2 weeks ago we did a fairly extensive mechanical debridement. We are asked medical modalities to go out to the home and see what they might be eligible for in terms of pressure-relief surfaces [level 2] but the wife states that they have not heard from them. 03/20/15; this is a patient we haven't seen in 5-6 weeks. This was largely due to transportation issues. They've been using Santyl and border foam changing every second day for a pressure injury over the right scapula. The patient has a C3-C4 spinal injury. We had also asked for a review by the people who supplied DME to look at his wheelchair cushion, mattress etc. I don't know that this ever happened. In the meantime the wound has done remarkably well current measurements 1.8 x 2 x 0.1 04/03/16; the patient's dimensions have gone up to 3 cm in diameter quite a deterioration from last time. He is also complaining of pain in this area which is new. 04/10/16; 3 x 1.5 x 0.1. No difference from last week. Culture I did last week was negative he has completed antibiotics. 04/24/16 2.4 x 2 x 0.1; wound generally looks smaller. Middle area that I had to debrided last time looks healthier. His son is fashioned a large piece of foam cut out where  the patient's wound with hit the back of his wheelchair 05/01/16; wound is a same size however the surface of this looks better. The middle innkeeper area required a repeat debridement 05/15/16; wound is down and dimensions granulation still looks healthy. The medial aspect of this wound is now the deeper Divot. We'll see how this responds to further healing. We're using Hydrofera Blue 06/05/16; Wound 1.7x1.1x0.1 still using hyudrofera blue 06/26/16;  the patient arrives back in clinic after a three-week hiatus. He was hospitalized from 06/09/16 through 06/10/16. He was found to be confused. CT scan of the head showed nothing really acute. His sodium was low at 131. He was rehydrated. He was felt to have a UTI and given antibiotics and antibiotics at discharge although his final culture result only showed multiple organisms. His wife says today that at the time of the hospitalization they discovered a large intact blister over the large aspect of his left calcaneus. This is recently ruptured. The wound on his right scapula is somewhat larger. More his wife is concerned about his current status. She states that he is still confused sleeps for long periods. He is not having fever chills cough or diarrhea [1 loose bowel movement per day]. He continues to have a suprapubic catheter. She notes that he is not eating and drinking well. Patient states he just does not want to eat. He does not feel nauseated or vomit. 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 Admission sodium was 131 at discharge 139 BUN 22 and 1.22 at admission, 17 and 1 at discharge. 07/03/16; patient's mental status is back to normal. He has a new wound on the right elbow caused by traumatizing his elbow against the wall apparently at the dermatologist office last week. We continue with the original wound on the right scapula and Lorson, Broox C.  ([id number]) the wound from 2 weeks ago on his left heel. We have been using silver alginate to the area on the heel. 08/14/16; patient has not been here in almost 6 weeks. When he was here last time he had the pressure ulcer on the right scapula, atraumatic wound on the right lateral elbow and an area on his left heel. His wife states that at one point all of these were healed and she didn't really feel he needed to come back here. Since then the patient has developed a reopening of the area on the right scapula, a stage II wound on the left buttock and a reopening of the area on the right lateral elbow. As usual his wife as a litany of complaints against home health, she is dismissing or is going to dismiss well care. She tells [place] she has a long list of supplies at home already for some reason they were not felt to be eligible for a group 2 or 3 surface although they have a hospital bed at home but no offloading surface 08/28/16; the patient arrived today unfortunately incontinent of stool in spite of this the wounds all appear to be better including the right scapular area, his left buttock's left heel and the right lateral elbow. 09/18/16; this is a patient who arrived today for follow-up of a pressure area over his right scapula area, left buttock and there was an area on his right lateral elbow. He arrived in clinic today with a worrisome area over the right initial tuberosity/right buttock. This had a necrotic surface with draining purulence. He has not been systemically unwell. The area over the left elbow healed. He arrived in clinic today with dressing that hadn't been changed over the scapula for 2 or 3 days per her intake. His wife is previously fired home health 09/25/16;; the last time the patient was here he arrived with a new grossly infected wound over the right ischial tuberosity. Culture I did not show a specific pathogen.  I did think this required admission to hospital and he was  admitted from 09/18/16 through 09/20/16. Initially given vanc and Zosyn but then discharged with 5 days' worth of Augmentin. An MRI was done that did not show osteomyelitis of the right ischial tuberosity however it did show cellulitis and possible myositis. He also has wounds on the left ischial tuberosity and the area over the right scapular area. 10/04/16 on evaluation today patient appears to be doing better in regard to his back wound and is stable in regard to the gluteal wounds. There does not appear to be any evidence of significant infection at this point. He is tolerating the dressing changes currently. His wife has been performing the dressing changes. Nonetheless he does have some discomfort especially in the gluteal areas although he did not specifically rate the discomfort there were times during evaluation where he flinched and it was obvious it was bothering him. No fevers, chills, nausea, or vomiting noted at this time. 10/17/16; the patient has bilateral ischial tuberosity wounds right greater than left. The left seems to be making good progress wears the right has not really changed that much and dimensions. There is also some threatened area on the right side around the wound circumference. Equally worrisome there is a DTI over the lower sacral area however this is not open as of yet. His area over the left scapula which is the chronic wound he was coming to the clinic continues to make excellent progress 8/28 The patient comes in today wanting to talk about getting back in his wheelchair. He is very bored lying in bed at home 11/14/16; three-week hiatus for this patient for medical illnesses whether etc. He was in the emergency room at [place] 2 days ago when his wife noted odor coming out of the right initial tuberosity wound and discoloration. I reviewed his ER presentation from 11/12/16. White count was 11.7 differential count reasonably normal basic metabolic panel was normal.  Serum lactate was 1.9. He has a suprapubic catheter which showed positive urine nitrate large leukocytes and many bacteria. Urine cultures come back showing multiple species. Blood culture was negative. Wound culture is still pending. He was put on Keflex and Bactrim. The patient had an MRI of this area on 09/18/16 that did not show osteomyelitis however at that time there was possible cellulitis and infectious myositis no drainable fluid collection. After this he wishes admitted the hospital had vancomycin and Zosyn for 5 days and then 5 days of Augmentin 11/20/16; culture of the wound that I did last week showed multiple organisms. This was the same as from the emergency room as it turns out. He is still on Keflex and Bactrim and completing this. I was really expecting something a little more ominous although he seems to have stabilized. We have been using silver alginate to the major wound on the right ischial tuberosity. He had a DTI over the right greater trochanter, superficial wound over the left ischial tuberosity and a DTI on the superior part of the left hemipelvis. FORTUNATELY everything looks a little better here than last week. At my suggestion his wife looked into [facility] but was told that he needed a 3 day acute hospital stay. This would be similar to what is required for skilled nursing and I was not specifically aware of this although it may have something to do with the patient's specific insurance 11/27/16; he has completed his antibiotics. All of his wounds appeared to have stabilized. This  includes the necrotic right ischial tuberosity wound, DTI on the superior pelvis, DTI over the left greater trochanter. We have been using Aquacel Ag to all wound areas. He arrives in clinic today with a low temperature at first not registering at all then 91.5. After serial checks with different instruments we got this up to 94 although the patient looks fine is mentating  normally. His wife reports that this is often the pattern when he goes to the hospital and that they'll put him in a heating blanket and send him home although I don't really see evidence of this on the most recent ER trips. She states she has a warming blanket and a rectal for mom under the [facility], [name] ([id number]) registers down to 91. I've asked her to check his temperature when she goes home. They both seemed very reluctant to go to the emergency department over this and to be truthful he looks stable enough to avoid this at least for now 12/03/16; patient arrives with all of his wounds and a considerably better situation. His wife states she has been changing him and turning him. The major remaining wound is in the right ischial tuberosity over even it looks well granulated and the copious amounts of necrotic tissue that I took out several weeks ago with coexistent infection looks to be resolved. We have been using silver alginate to all wounds 12/18/16; patient arrives for his two-week follow-up as usual accompanied by his wife. Unfortunately things have not gone well. They tell me that as of Friday evening he started to complain of severe pain in the lower buttock area presumably around the wounds although I'm not completely certain of that. This is different for this patient to rarely complains of any pain. They also noted significant odor to the wounds. We have been using silver alginate. He has not had any falls. No fever [ or hypothermia]. He was at his primary doctor in [place] today and had lab work done. 12/18/16; patient's deep tissue culture from last week grew Proteus and MRSA. He started Septra DS and Augmentin late 2 days ago. He is not spontaneously complaining of as much pain as last week although the patient's wife states when she changes the dressing he complains of pain. He apparently refused to go and have x-rays of the pelvis done to look at the right ischial tuberosity  specifically. We have been using silver alginate largely because of the necrotic wound in the lower part of the right ischial tuberosity. He has not been systemically unwell Patient History Information obtained from Patient. Family History Heart Disease - Mother,Paternal Grandparents, Hypertension - Father,Paternal Grandparents, No family history of Cancer, Diabetes, Hereditary Spherocytosis, Kidney Disease, Lung Disease, Seizures, Stroke, Thyroid Problems, Tuberculosis. Social History Never smoker, Marital Status - Married, Alcohol Use - Never, Drug Use - No History, Caffeine Use - Daily. Review of Systems (ROS) Constitutional Symptoms (General Health) The patient has no complaints or symptoms. Respiratory The patient has no complaints or symptoms. Cardiovascular The patient has no complaints or symptoms. Gastrointestinal The patient has no complaints or symptoms, He is eating well Genitourinary Suprapubic catheter Integumentary (Skin) Complains or has symptoms of Wounds - Right ischial tuberosity, left ischial tuberosity and lower sacrum, Patient complains of pain in the pelvic area. 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 Objective Constitutional Patient is hypertensive.. Pulse regular and within target range for patient.Marland Kitchen Respirations regular, non-labored and within target range.. Temperature is normal and within  the target range for the patient.Marland Kitchen appears in no distress. [name], [name] ([id number]) Vitals Time Taken: 2:30 PM, Height: 70 in, Weight: 187 lbs, BMI: 26.8, Temperature: 97.5 F, Pulse: 82 bpm, Respiratory Rate: 16 breaths/min, Blood Pressure: 161/61 mmHg. Eyes Conjunctivae clear. No discharge. Respiratory Respiratory effort is easy and symmetric bilaterally. Rate is normal at rest and on room air.. Bilateral breath sounds are clear and equal in all lobes with no wheezes, rales or rhonchi.. Cardiovascular Heart rhythm and rate regular,  without murmur or gallop.. Gastrointestinal (GI) Abdomen is soft and non-distended without masses or tenderness. Bowel sounds active in all quadrants.. No liver or spleen enlargement or tenderness.. Genitourinary (GU) Suprapubic catheter. Psychiatric Somewhat of a depressed affect. General Notes: Wound exam; the lower third of the right ischial tuberosity wound remains necrotic with darkening of the circumference of this wound around this area. There is less necrotic material than last week. No redness and no crepitus around the wound nevertheless is remains very worrisome for underlying ischial tuberosity He has eschar over the area on the lower sacrum and the left ischial tuberosity none of these appear to be particularly active no debridement here either Integumentary (Hair, Skin) No systemic rash. Wound #1 status is Healed - Epithelialized. Original cause of wound was Pressure Injury. The wound is located on the Right,Proximal Back. The wound measures 0cm length x 0cm width x 0cm depth; 0cm^2 area and 0cm^3 volume. Wound #4 status is Open. Original cause of wound was Pressure Injury. The wound is located on the Left Gluteus. The wound measures 2cm length x 2.5cm width x 0.1cm depth; 3.927cm^2 area and 0.393cm^3 volume. Wound #5 status is Open. Original cause of wound was Pressure Injury. The wound is located on the Right Gluteus. The wound measures 4.2cm length x 4.5cm width x 0.9cm depth; 14.844cm^2 area and 13.36cm^3 volume. There is muscle and Fat Layer (Subcutaneous Tissue) Exposed exposed. There is undermining starting at 5:00 and ending at 10:00 with a maximum distance of 1.3cm. There is a large amount of serous drainage noted. Foul odor after cleansing was noted. The wound margin is flat and intact. There is medium (34-66%) red granulation within the wound bed. There is a medium (34- 66%) amount of necrotic tissue within the wound bed including Adherent Slough. The periwound skin  appearance exhibited: Induration, Erythema. The surrounding wound skin color is noted with erythema which is circumferential. Periwound temperature was noted as No Abnormality. The periwound has tenderness on palpation. Wound #6 status is Open. Original cause of wound was Pressure Injury. The wound is located on the Medial Sacrum. The wound measures 1.7cm length x 4cm width x 0.1cm depth; 5.341cm^2 area and 0.534cm^3 volume. Wound #7R status is Open. Original cause of wound was Pressure Injury. The wound is located on the Right Trochanter. The wound measures 1.5cm length x 2.5cm width x 0.1cm depth; 2.945cm^2 area and 0.295cm^3 volume. There is no tunneling or undermining noted. There is a small amount of serous drainage noted. The wound margin is flat and intact. There is no granulation within the wound bed. There is no necrotic tissue within the wound bed. The periwound skin appearance did not exhibit: Callus, Crepitus, Excoriation, Induration, Rash, Scarring, Dry/Scaly, Maceration, Atrophie Blanche, Cyanosis, Ecchymosis, Hemosiderin Staining, Mottled, Pallor, Rubor, Erythema. [name], [name] ([id number]) Plan Wound Cleansing: Wound #4 Left Gluteus: Clean wound with Normal Saline. Cleanse wound with mild soap and water Wound #5 Right Gluteus: Clean wound with Normal Saline. Cleanse wound with mild soap  and water Wound #6 Medial Sacrum: Clean wound with Normal Saline. Cleanse wound with mild soap and water Anesthetic: Wound #5 Right Gluteus: Topical Lidocaine 4% cream applied to wound bed prior to debridement Skin Barriers/Peri-Wound Care: Wound #4 Left Gluteus: Skin Prep Wound #5 Right Gluteus: Skin Prep Wound #6 Medial Sacrum: Skin Prep Primary Wound Dressing: Wound #4 Left Gluteus: Aquacel Ag Wound #5 Right Gluteus: Aquacel Ag Wound #6 Medial Sacrum: Aquacel Ag Secondary Dressing: Wound #4 Left Gluteus: Boardered Foam Dressing Wound #5 Right Gluteus: Boardered Foam  Dressing Wound #6 Medial Sacrum: Boardered Foam Dressing Dressing Change Frequency: Wound #4 Left Gluteus: Change dressing every other day. Wound #5 Right Gluteus: Change dressing every other day. Wound #6 Medial Sacrum: Change dressing every other day. Follow-up Appointments: Wound #4 Left Gluteus: Return Appointment in 1 week. Wound #5 Right Gluteus: Return Appointment in 1 week. Wound #6 Medial Sacrum: Return Appointment in 1 week. Off-Loading: Wound #4 Left Gluteus: Roho cushion for wheelchair Turn and reposition every 2 hours Wound #5 Right Gluteus: Roho cushion for wheelchair [name], [name] ([id number]) Turn and reposition every 2 hours Wound #6 Medial Sacrum: Roho cushion for wheelchair Turn and reposition every 2 hours Additional Orders / Instructions: Wound #4 Left Gluteus: Increase protein intake. Wound #5 Right Gluteus: Increase protein intake. Wound #6 Medial Sacrum: Increase protein intake. Medications-please add to medication list.: Wound #4 Left Gluteus: Other: - Vitamins A, C and Zinc Wound #5 Right Gluteus: Other: - Vitamins A, C and Zinc Wound #6 Medial Sacrum: Other: - Vitamins A, C and Zinc #1 continue the Augmentin and Septra as he has just started this 2 days ago. I gave him 10 days' worth #2 I have encouraged x-ray of the right ischial tuberosity question osteomyelitis #3 the pain issues seems less prominent this week #4 he is not spending as much time in the wheelchair #5 he is going to need more debridement on the right ischial tuberosity wound but given the fact he hasn't been on antibiotics for a long I left this for now Electronic Signature(s) Signed: 12/26/2016 4:32:10 PM By: Baltazar Najjar MD Entered By: Baltazar Najjar on 12/26/2016 16:30:06 [name], [name] ([id number]) -------------------------------------------------------------------------------- ROS/PFSH Details Patient Name: [name] C. Date of Service: 12/26/2016  3:30 PM Medical Record Number: [id number] Patient Account Number: [id number] Date of Birth/Sex: 1950-10-10 (66 y.o. Male) Treating RN: Huel Coventry Primary Care Provider: Elizabeth Sauer Other Clinician: Referring Provider: Elizabeth Sauer Treating Provider/Extender: Altamese Albert in Treatment: 57 Information Obtained From Patient Wound History Do you currently have one or more open woundso Yes How many open wounds do you currently haveo 1 Approximately how long have you had your woundso 2 months How have you been treating your wound(s) until nowo santyl Has your wound(s) ever healed and then re-openedo No Have you had any lab work done in the past montho No Have you tested positive for an antibiotic resistant organism (MRSA, VRE)o No Have you tested positive for osteomyelitis (bone infection)o No Have you had any tests for circulation on your legso No Have you had other problems associated with your woundso Swelling Integumentary (Skin) Complaints and Symptoms: Positive for: Wounds - Right ischial tuberosity, left ischial tuberosity and lower sacrum Review of System Notes: Patient complains of pain in the pelvic area. 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 Constitutional Symptoms (General Health) Complaints and Symptoms: No Complaints or Symptoms Respiratory Complaints and Symptoms: No Complaints or Symptoms Cardiovascular Complaints and  Symptoms: No Complaints or Symptoms Medical History: Positive for: Coronary Artery Disease Gastrointestinal Complaints and Symptoms: No Complaints or Symptoms Complaints and Symptoms: Review of System Notes: He is eating well Endocrine [name], [name] ([id number]) Medical History: Positive for: Type II Diabetes Time with diabetes: 2 YEARS Treated with: Oral agents Blood sugar tested every day: No Genitourinary Complaints and Symptoms: Review of System Notes: Suprapubic catheter Neurologic Medical  History: Positive for: Quadriplegia; Seizure Disorder Immunizations Pneumococcal Vaccine: Received Pneumococcal Vaccination: Yes Implantable Devices Family and Social History Cancer: No; Diabetes: No; Heart Disease: Yes - Mother,Paternal Grandparents; Hereditary Spherocytosis: No; Hypertension: Yes - Father,Paternal Grandparents; Kidney Disease: No; Lung Disease: No; Seizures: No; Stroke: No; Thyroid Problems: No; Tuberculosis: No; Never smoker; Marital Status - Married; Alcohol Use: Never; Drug Use: No History; Caffeine Use: Daily; Financial Concerns: No; Food, Clothing or Shelter Needs: No; Support System Lacking: No; Transportation Concerns: No; Advanced Directives: No; Patient does not want information on Advanced Directives; Do not resuscitate: No; Living Will: No; Medical Power of Attorney: Yes - RHONDA Emley (Not Provided) Electronic Signature(s) Signed: 12/26/2016 4:32:10 PM By: Baltazar Najjarobson, Berley Gambrell MD Signed: 12/27/2016 5:34:16 PM By: Elliot GurneyWoody, BSN, RN, CWS, Kim RN, BSN Entered By: Baltazar Najjarobson, Milo Solana on 12/26/2016 16:25:46 [name], [name] BienICHARD C. ([id number]) -------------------------------------------------------------------------------- SuperBill Details Patient Name: [name] BrasHATCH, Patricia C. Date of Service: 12/26/2016 Medical Record Number: [id number] Patient Account Number: [id number] Date of Birth/Sex: 1950/03/23 [age] y.o. Male) Treating RN: Huel CoventryWoody, Kim Primary Care Provider: Elizabeth SauerJones, Deanna Other Clinician: Referring Provider: Elizabeth SauerJones, Deanna Treating Provider/Extender: Maxwell CaulOBSON, Emalene Welte G Weeks in Treatment: 48 Diagnosis Coding ICD-10 Codes Code Description 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, stage 3 S14.103S Unspecified injury at C3 level of cervical spinal cord, sequela S51.011A Laceration without foreign body of right elbow, initial encounter L89.322 Pressure ulcer of left buttock, stage 2 L89.310 Pressure ulcer of right buttock, unstageable L03.317 Cellulitis of buttock Facility  Procedures CPT4 Code: [id number] Description: [id number] - WOUND CARE VISIT-LEV 5 EST PT Modifier: Quantity: 1 Physician Procedures CPT4 Code: [id number] Description: 99213 - WC PHYS LEVEL 3 - EST PT ICD-10 Diagnosis Description 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, stage 3 L89.310 Pressure ulcer of right buttock, unstageable Modifier: Quantity: 1 Electronic Signature(s) Signed: 12/26/2016 4:32:10 PM By: Baltazar Najjarobson, Rihanna Marseille MD Entered By: Baltazar Najjarobson, Hall Birchard on 12/26/2016 16:30:26

## 2016-12-29 NOTE — Progress Notes (Signed)
MAXSON, ODDO ([id number]) Visit Report for 12/26/2016 Arrival Information Details Patient Name: [name], [name]. Date of Service: 12/26/2016 3:30 PM Medical Record Number: [id number] Patient Account Number: [id number] Date of Birth/Sex: [birth year] (66 y.o. Male) Treating RN: Cornell Barman Primary Care Rennie Rouch: Otilio Miu Other Clinician: Referring Lether Tesch: Otilio Miu Treating Shakinah Navis/Extender: Tito Dine in Treatment: 61 Visit Information History Since Last Visit Added or deleted any medications: No Patient Arrived: Wheel Chair Any new allergies or adverse reactions: No Arrival Time: 15:25 Signs or symptoms of abuse/neglect since last visito No Accompanied By: wife Hospitalized since last visit: No Transfer Assistance: Civil Service fast streamer Has Dressing in Place as Prescribed: Yes Patient Identification Verified: Yes Pain Present Now: Yes Secondary Verification Process Completed: Yes Patient Requires Transmission-Based No Precautions: Patient Has Alerts: Yes Electronic Signature(s) Signed: 12/27/2016 5:34:16 PM By: Gretta Cool, BSN, RN, CWS, Kim RN, BSN Entered By: Gretta Cool, BSN, RN, CWS, Kim on 12/26/2016 15:29:44 Brinkman, Leslye Peer ([id number]) -------------------------------------------------------------------------------- Clinic Level of Care Assessment Details Patient Name: Griesinger, Kru C. Date of Service: 12/26/2016 3:30 PM Medical Record Number: [id number] Patient Account Number: [id number] Date of Birth/Sex: Aug 22, 1950 (66 y.o. Male) Treating RN: Cornell Barman Primary Care Sumaiya Arruda: Otilio Miu Other Clinician: Referring Jakeim Sedore: Otilio Miu Treating Tinley Rought/Extender: Tito Dine in Treatment: 75 Clinic Level of Care Assessment Items TOOL 4 Quantity Score '[]'$  - Use when only an EandM is performed on FOLLOW-UP visit 0 ASSESSMENTS - Nursing Assessment / Reassessment '[]'$  - Reassessment of Co-morbidities (includes updates in patient status) 0 X- 1  5 Reassessment of Adherence to Treatment Plan ASSESSMENTS - Wound and Skin Assessment / Reassessment '[]'$  - Simple Wound Assessment / Reassessment - one wound 0 X- 5 5 Complex Wound Assessment / Reassessment - multiple wounds '[]'$  - 0 Dermatologic / Skin Assessment (not related to wound area) ASSESSMENTS - Focused Assessment '[]'$  - Circumferential Edema Measurements - multi extremities 0 '[]'$  - 0 Nutritional Assessment / Counseling / Intervention '[]'$  - 0 Lower Extremity Assessment (monofilament, tuning fork, pulses) '[]'$  - 0 Peripheral Arterial Disease Assessment (using hand held doppler) ASSESSMENTS - Ostomy and/or Continence Assessment and Care '[]'$  - Incontinence Assessment and Management 0 '[]'$  - 0 Ostomy Care Assessment and Management (repouching, etc.) PROCESS - Coordination of Care X - Simple Patient / Family Education for ongoing care 1 15 '[]'$  - 0 Complex (extensive) Patient / Family Education for ongoing care '[]'$  - 0 Staff obtains Programmer, systems, Records, Test Results / Process Orders '[]'$  - 0 Staff telephones HHA, Nursing Homes / Clarify orders / etc '[]'$  - 0 Routine Transfer to another Facility (non-emergent condition) '[]'$  - 0 Routine Hospital Admission (non-emergent condition) '[]'$  - 0 New Admissions / Biomedical engineer / Ordering NPWT, Apligraf, etc. '[]'$  - 0 Emergency Hospital Admission (emergent condition) X- 1 10 Simple Discharge Coordination JAKALEB, PAYER. ([id number]) '[]'$  - 0 Complex (extensive) Discharge Coordination PROCESS - Special Needs '[]'$  - Pediatric / Minor Patient Management 0 '[]'$  - 0 Isolation Patient Management '[]'$  - 0 Hearing / Language / Visual special needs '[]'$  - 0 Assessment of Community assistance (transportation, D/C planning, etc.) '[]'$  - 0 Additional assistance / Altered mentation '[]'$  - 0 Support Surface(s) Assessment (bed, cushion, seat, etc.) INTERVENTIONS - Wound Cleansing / Measurement '[]'$  - Simple Wound Cleansing - one wound 0 X- 5 5 Complex Wound  Cleansing - multiple wounds X- 1 5 Wound Imaging (photographs - any number of wounds) '[]'$  - 0 Wound Tracing (instead of photographs) '[]'$  - 0 Simple Wound Measurement -  one wound X- 5 5 Complex Wound Measurement - multiple wounds INTERVENTIONS - Wound Dressings '[]'$  - Small Wound Dressing one or multiple wounds 0 X- 5 15 Medium Wound Dressing one or multiple wounds '[]'$  - 0 Large Wound Dressing one or multiple wounds '[]'$  - 0 Application of Medications - topical '[]'$  - 0 Application of Medications - injection INTERVENTIONS - Miscellaneous '[]'$  - External ear exam 0 '[]'$  - 0 Specimen Collection (cultures, biopsies, blood, body fluids, etc.) '[]'$  - 0 Specimen(s) / Culture(s) sent or taken to Lab for analysis '[]'$  - 0 Patient Transfer (multiple staff / Civil Service fast streamer / Similar devices) '[]'$  - 0 Simple Staple / Suture removal (25 or less) '[]'$  - 0 Complex Staple / Suture removal (26 or more) '[]'$  - 0 Hypo / Hyperglycemic Management (close monitor of Blood Glucose) '[]'$  - 0 Ankle / Brachial Index (ABI) - do not check if billed separately X- 1 5 Vital Signs Dimitroff, Rino C. ([id number]) Has the patient been seen at the hospital within the last three years: Yes Total Score: 190 Level Of Care: New/Established - Level 5 Electronic Signature(s) Signed: 12/27/2016 5:34:16 PM By: Gretta Cool, BSN, RN, CWS, Kim RN, BSN Entered By: Gretta Cool, BSN, RN, CWS, Kim on 12/26/2016 16:20:19 Ulin, Leslye Peer ([id number]) -------------------------------------------------------------------------------- Encounter Discharge Information Details Patient Name: Cantrelle, Jahvier C. Date of Service: 12/26/2016 3:30 PM Medical Record Number: [id number] Patient Account Number: [id number] Date of Birth/Sex: [birth year] (66 y.o. Male) Treating RN: Cornell Barman Primary Care Kacey Dysert: Otilio Miu Other Clinician: Referring Shaquera Ansley: Otilio Miu Treating Keylin Ferryman/Extender: Tito Dine in Treatment: 34 Encounter Discharge Information  Items Discharge Pain Level: 0 Discharge Condition: Stable Ambulatory Status: Ambulatory Discharge Destination: Home Transportation: Private Auto Accompanied By: wife Schedule Follow-up Appointment: Yes Medication Reconciliation completed and Yes provided to Patient/Care Chrisy Hillebrand: Patient Clinical Summary of Care: Declined Electronic Signature(s) Signed: 12/27/2016 5:34:16 PM By: Gretta Cool, BSN, RN, CWS, Kim RN, BSN Entered By: Gretta Cool, BSN, RN, CWS, Kim on 12/26/2016 16:22:23 Deltoro, Leslye Peer ([id number]) -------------------------------------------------------------------------------- Lower Extremity Assessment Details Patient Name: Jahn, Mubashir C. Date of Service: 12/26/2016 3:30 PM Medical Record Number: [id number] Patient Account Number: [id number] Date of Birth/Sex: [birth year] (66 y.o. Male) Treating RN: Cornell Barman Primary Care Shriyan Arakawa: Otilio Miu Other Clinician: Referring Kal Chait: Otilio Miu Treating Karman Veney/Extender: Tito Dine in Treatment: 35 Electronic Signature(s) Signed: 12/27/2016 5:34:16 PM By: Gretta Cool, BSN, RN, CWS, Kim RN, BSN Entered By: Gretta Cool, BSN, RN, CWS, Kim on 12/26/2016 15:53:54 Arismendez, Leslye Peer ([id number]) -------------------------------------------------------------------------------- Multi Wound Chart Details Patient Name: DAMEAN, POFFENBERGER C. Date of Service: 12/26/2016 3:30 PM Medical Record Number: [id number] Patient Account Number: [id number] Date of Birth/Sex: 18-Aug-1950 (66 y.o. Male) Treating RN: Cornell Barman Primary Care Hassel Uphoff: Otilio Miu Other Clinician: Referring Lauralie Blacksher: Otilio Miu Treating Gaines Cartmell/Extender: Ricard Dillon Weeks in Treatment: 48 Vital Signs Height(in): 70 Pulse(bpm): 56 Weight(lbs): 187 Blood Pressure(mmHg): 161/61 Body Mass Index(BMI): 27 Temperature(F): 97.5 Respiratory Rate 16 (breaths/min): Photos: [1:No Photos] [4:No Photos] [5:No Photos] Wound Location: [1:Right, Proximal Back]  [4:Left Gluteus] [5:Right Gluteus] Wounding Event: [1:Pressure Injury] [4:Pressure Injury] [5:Pressure Injury] Primary Etiology: [1:Pressure Ulcer] [4:Pressure Ulcer] [5:Pressure Ulcer] Comorbid History: [1:N/A] [4:N/A] [5:Coronary Artery Disease, Type II Diabetes, Quadriplegia, Seizure Disorder] Date Acquired: [1:11/24/2015] [4:07/09/2016] [5:09/10/2016] Weeks of Treatment: [1:48] [4:19] [5:14] Wound Status: [1:Healed - Epithelialized] [4:Open] [5:Open] Wound Recurrence: [1:No] [4:No] [5:No] Clustered Wound: [1:No] [4:No] [5:No] Clustered Quantity: [1:N/A] [4:N/A] [5:N/A] Measurements L x W x D [1:0x0x0] [4:2x2.5x0.1] [5:4.2x4.5x0.9] (cm) Area (cm) : [1:0] [4:3.927] [5:14.844] Volume (cm) : [  1:0] [4:0.393] [5:13.36] % Reduction in Area: [1:100.00%] [4:-2401.30%] [5:-162.50%] % Reduction in Volume: [1:100.00%] [4:-2356.20%] [5:-2264.60%] Starting Position 1 [5:5] (o'clock): Ending Position 1 [5:10] (o'clock): Maximum Distance 1 (cm): [5:1.3] Undermining: [1:N/A] [4:N/A] [5:Yes] Classification: [1:Category/Stage II] [4:Unstageable/Unclassified] [5:Category/Stage IV] Exudate Amount: [1:N/A] [4:N/A] [5:Large] Exudate Type: [1:N/A] [4:N/A] [5:Serous] Exudate Color: [1:N/A] [4:N/A] [5:amber] Foul Odor After Cleansing: [1:N/A] [4:N/A] [5:Yes] Odor Anticipated Due to [1:N/A] [4:N/A] [5:No] Product Use: Wound Margin: [1:N/A] [4:N/A] [5:Flat and Intact] Granulation Amount: [1:N/A] [4:N/A] [5:Medium (34-66%)] Granulation Quality: [1:N/A] [4:N/A] [5:Red] Necrotic Amount: [1:N/A] [4:N/A] [5:Medium (34-66%)] Epithelialization: [1:N/A] [4:N/A] [5:None] Periwound Skin Texture: No Abnormalities Noted No Abnormalities Noted Induration: Yes Periwound Skin Moisture: No Abnormalities Noted No Abnormalities Noted No Abnormalities Noted Periwound Skin Color: No Abnormalities Noted No Abnormalities Noted Erythema: Yes Erythema Location: N/A N/A Circumferential Temperature: N/A N/A No  Abnormality Tenderness on Palpation: No No Yes Wound Preparation: N/A N/A Ulcer Cleansing: Rinsed/Irrigated with Saline Topical Anesthetic Applied: Other: lidocaine 4% Wound Number: [id number] N/A Photos: No Photos No Photos N/A Wound Location: Medial Sacrum Right Trochanter N/A Wounding Event: Pressure Injury Pressure Injury N/A Primary Etiology: Pressure Ulcer Pressure Ulcer N/A Comorbid History: N/A Coronary Artery Disease, Type N/A II Diabetes, Quadriplegia, Seizure Disorder Date Acquired: 10/16/2016 11/06/2016 N/A Weeks of Treatment: 9 5 N/A Wound Status: Open Open N/A Wound Recurrence: No Yes N/A Clustered Wound: No Yes N/A Clustered Quantity: N/A 2 N/A Measurements L x W x D 1.7x4x0.1 1.5x2.5x0.1 N/A (cm) Area (cm) : 5.341 2.945 N/A Volume (cm) : 0.534 0.295 N/A % Reduction in Area: 74.80% -212.60% N/A % Reduction in Volume: 74.80% -213.80% N/A Undermining: N/A No N/A Classification: Category/Stage II Unstageable/Unclassified N/A Exudate Amount: N/A Small N/A Exudate Type: N/A Serous N/A Exudate Color: N/A amber N/A Foul Odor After Cleansing: N/A No N/A Odor Anticipated Due to N/A N/A N/A Product Use: Wound Margin: N/A Flat and Intact N/A Granulation Amount: N/A None Present (0%) N/A Granulation Quality: N/A N/A N/A Necrotic Amount: N/A None Present (0%) N/A Exposed Structures: N/A N/A N/A Epithelialization: N/A Small (1-33%) N/A Periwound Skin Texture: No Abnormalities Noted Excoriation: No N/A Induration: No Callus: No Crepitus: No Rash: No Scarring: No Periwound Skin Moisture: No Abnormalities Noted Maceration: No N/A Dry/Scaly: No Periwound Skin Color: No Abnormalities Noted Atrophie Blanche: No N/A Cyanosis: No Ecchymosis: No Fader, Carlas C. ([id number]) Erythema: No Hemosiderin Staining: No Mottled: No Pallor: No Rubor: No Erythema Location: N/A N/A N/A Temperature: N/A N/A N/A Tenderness on Palpation: No No N/A Wound Preparation: N/A Ulcer  Cleansing: N/A Rinsed/Irrigated with Saline Topical Anesthetic Applied: Other: lidocaine 4% Treatment Notes Electronic Signature(s) Signed: 12/27/2016 5:34:16 PM By: Gretta Cool, BSN, RN, CWS, Kim RN, BSN Entered By: Gretta Cool, BSN, RN, CWS, Kim on 12/26/2016 15:54:14 Percifield, Leslye Peer ([id number]) -------------------------------------------------------------------------------- Multi-Disciplinary Care Plan Details Patient Name: TEVIN, SHILLINGFORD. Date of Service: 12/26/2016 3:30 PM Medical Record Number: [id number] Patient Account Number: [id number] Date of Birth/Sex: [birth year] (66 y.o. Male) Treating RN: Cornell Barman Primary Care Karolee Meloni: Otilio Miu Other Clinician: Referring Kaelei Wheeler: Otilio Miu Treating Berlene Dixson/Extender: Ricard Dillon Weeks in Treatment: 80 Active Inactive ` Nutrition Nursing Diagnoses: Imbalanced nutrition Impaired glucose control: actual or potential Goals: Patient/caregiver agrees to and verbalizes understanding of need to use nutritional supplements and/or vitamins as prescribed Date Initiated: 01/24/2016 Target Resolution Date: 03/27/2016 Goal Status: Active Patient/caregiver will maintain therapeutic glucose control Date Initiated: 01/24/2016 Target Resolution Date: 03/27/2016 Goal Status: Active Interventions: Assess patient nutrition upon admission and as needed per policy Provide education  on elevated blood sugars and impact on wound healing Notes: ` Orientation to the Wound Care Program Nursing Diagnoses: Knowledge deficit related to the wound healing center program Goals: Patient/caregiver will verbalize understanding of the [place] Date Initiated: 01/24/2016 Target Resolution Date: 03/27/2016 Goal Status: Active Interventions: Provide education on orientation to the wound center Notes: ` Pain, Acute or Chronic Nursing Diagnoses: Pain, acute or chronic: actual or potential Potential alteration in comfort, pain Stueve,  UGOCHUKWU CHICHESTER ([id number]) Goals: Patient will verbalize adequate pain control and receive pain control interventions during procedures as needed Date Initiated: 01/24/2016 Target Resolution Date: 03/27/2016 Goal Status: Active Patient/caregiver will verbalize adequate pain control between visits Date Initiated: 01/24/2016 Target Resolution Date: 03/27/2016 Goal Status: Active Patient/caregiver will verbalize comfort level met Date Initiated: 01/24/2016 Target Resolution Date: 03/27/2016 Goal Status: Active Interventions: Assess comfort goal upon admission Complete pain assessment as per visit requirements Notes: ` Pressure Nursing Diagnoses: Knowledge deficit related to management of pressures ulcers Potential for impaired tissue integrity related to pressure, friction, moisture, and shear Goals: Patient will remain free from development of additional pressure ulcers Date Initiated: 01/24/2016 Target Resolution Date: 03/27/2016 Goal Status: Active Interventions: Assess: immobility, friction, shearing, incontinence upon admission and as needed Assess offloading mechanisms upon admission and as needed Assess potential for pressure ulcer upon admission and as needed Provide education on pressure ulcers Notes: ` Wound/Skin Impairment Nursing Diagnoses: Impaired tissue integrity Goals: Ulcer/skin breakdown will have a volume reduction of 30% by week 4 Date Initiated: 01/24/2016 Target Resolution Date: 03/27/2016 Goal Status: Active Ulcer/skin breakdown will have a volume reduction of 50% by week 8 Date Initiated: 01/24/2016 Target Resolution Date: 03/27/2016 Goal Status: Active Ulcer/skin breakdown will have a volume reduction of 80% by week 12 Date Initiated: 01/24/2016 Target Resolution Date: 03/27/2016 JODY, SILAS ([id number]) Goal Status: Active Interventions: Assess patient/caregiver ability to perform ulcer/skin care regimen upon admission and as needed Assess  ulceration(s) every visit Notes: Electronic Signature(s) Signed: 12/27/2016 5:34:16 PM By: Gretta Cool, BSN, RN, CWS, Kim RN, BSN Entered By: Gretta Cool, BSN, RN, CWS, Kim on 12/26/2016 15:54:03 Kory, Leslye Peer ([id number]) -------------------------------------------------------------------------------- Pain Assessment Details Patient Name: EMILIO, BAYLOCK C. Date of Service: 12/26/2016 3:30 PM Medical Record Number: [id number] Patient Account Number: [id number] Date of Birth/Sex: [birth year] (66 y.o. Male) Treating RN: Cornell Barman Primary Care Shloime Keilman: Otilio Miu Other Clinician: Referring Sasha Rogel: Otilio Miu Treating Davidlee Jeanbaptiste/Extender: Ricard Dillon Weeks in Treatment: 23 Active Problems Location of Pain Severity and Description of Pain Patient Has Paino Yes Site Locations Rate the pain. Current Pain Level: 5 Worst Pain Level: 10 Pain Management and Medication Current Pain Management: Electronic Signature(s) Signed: 12/27/2016 5:34:16 PM By: Gretta Cool, BSN, RN, CWS, Kim RN, BSN Entered By: Gretta Cool, BSN, RN, CWS, Kim on 12/26/2016 15:30:27 Meyers, Leslye Peer ([id number]) -------------------------------------------------------------------------------- Patient/Caregiver Education Details Patient Name: BRODE, SCULLEY C. Date of Service: 12/26/2016 3:30 PM Medical Record Number: [id number] Patient Account Number: [id number] Date of Birth/Gender: 1950/09/22 (66 y.o. Male) Treating RN: Cornell Barman Primary Care Physician: Otilio Miu Other Clinician: Referring Physician: Otilio Miu Treating Physician/Extender: Tito Dine in Treatment: 34 Education Assessment Education Provided To: Patient Education Topics Provided Pressure: Handouts: Pressure Ulcers: Care and Offloading Methods: Demonstration Responses: State content correctly Electronic Signature(s) Signed: 12/27/2016 5:34:16 PM By: Gretta Cool, BSN, RN, CWS, Kim RN, BSN Entered By: Gretta Cool, BSN, RN, CWS, Kim on 12/26/2016  16:22:49 Nims, Leslye Peer ([id number]) -------------------------------------------------------------------------------- Wound Assessment Details Patient Name: ANTWIONE, PICOTTE C. Date of Service: 12/26/2016 3:30 PM  Medical Record Number: [id number] Patient Account Number: [id number] Date of Birth/Sex: 1950/09/14 (66 y.o. Male) Treating RN: Cornell Barman Primary Care Timira Bieda: Otilio Miu Other Clinician: Referring Erman Thum: Otilio Miu Treating Apolinar Bero/Extender: Ricard Dillon Weeks in Treatment: 48 Wound Status Wound Number: 1 Primary Etiology: Pressure Ulcer Wound Location: Right, Proximal Back Wound Status: Healed - Epithelialized Wounding Event: Pressure Injury Date Acquired: 11/24/2015 Weeks Of Treatment: 48 Clustered Wound: No Photos Photo Uploaded By: Gretta Cool, BSN, RN, CWS, Kim on 12/27/2016 13:35:08 Wound Measurements Length: (cm) 0 Width: (cm) 0 Depth: (cm) 0 Area: (cm) 0 Volume: (cm) 0 % Reduction in Area: 100% % Reduction in Volume: 100% Wound Description Classification: Category/Stage II Periwound Skin Texture Texture Color No Abnormalities Noted: No No Abnormalities Noted: No Moisture No Abnormalities Noted: No Electronic Signature(s) Signed: 12/27/2016 5:34:16 PM By: Gretta Cool, BSN, RN, CWS, Kim RN, BSN Entered By: Gretta Cool, BSN, RN, CWS, Kim on 12/26/2016 15:32:05 Glaus, Leslye Peer ([id number]) -------------------------------------------------------------------------------- Wound Assessment Details Patient Name: Spanos, Montee C. Date of Service: 12/26/2016 3:30 PM Medical Record Number: [id number] Patient Account Number: [id number] Date of Birth/Sex: Apr 18, 1950 (66 y.o. Male) Treating RN: Cornell Barman Primary Care Kestrel Mis: Otilio Miu Other Clinician: Referring Sears Oran: Otilio Miu Treating Telesha Deguzman/Extender: Ricard Dillon Weeks in Treatment: 48 Wound Status Wound Number: 4 Primary Etiology: Pressure Ulcer Wound Location: Left Gluteus Wound  Status: Open Wounding Event: Pressure Injury Date Acquired: 07/09/2016 Weeks Of Treatment: 19 Clustered Wound: No Photos Photo Uploaded By: Gretta Cool, BSN, RN, CWS, Kim on 12/27/2016 13:36:44 Wound Measurements Length: (cm) 2 Width: (cm) 2.5 Depth: (cm) 0.1 Area: (cm) 3.927 Volume: (cm) 0.393 % Reduction in Area: -2401.3% % Reduction in Volume: -2356.2% Wound Description Classification: Unstageable/Unclassified Periwound Skin Texture Texture Color No Abnormalities Noted: No No Abnormalities Noted: No Moisture No Abnormalities Noted: No Treatment Notes Wound #4 (Left Gluteus) 1. Cleansed with: Clean wound with Normal Saline 2. Anesthetic Topical Lidocaine 4% cream to wound bed prior to debridement 4. Dressing Applied: Other dressing (specify in notes) 5. Secondary Dressing Applied Bordered Foam Dressing KRUZ, CHIU ([id number]) Notes [employer] Electronic Signature(s) Signed: 12/27/2016 5:34:16 PM By: Gretta Cool, BSN, RN, CWS, Kim RN, BSN Entered By: Gretta Cool, BSN, RN, CWS, Kim on 12/26/2016 15:34:35 Shifflett, Leslye Peer ([id number]) -------------------------------------------------------------------------------- Wound Assessment Details Patient Name: Husby, Koi C. Date of Service: 12/26/2016 3:30 PM Medical Record Number: [id number] Patient Account Number: [id number] Date of Birth/Sex: 06-May-1950 (66 y.o. Male) Treating RN: Cornell Barman Primary Care Lizann Edelman: Otilio Miu Other Clinician: Referring Shoichi Mielke: Otilio Miu Treating Tahje Borawski/Extender: Ricard Dillon Weeks in Treatment: 48 Wound Status Wound Number: 5 Primary Pressure Ulcer Etiology: Wound Location: Right Gluteus Wound Open Wounding Event: Pressure Injury Status: Date Acquired: 09/10/2016 Comorbid Coronary Artery Disease, Type II Diabetes, Weeks Of Treatment: 14 History: Quadriplegia, Seizure Disorder Clustered Wound: No Photos Photo Uploaded By: Gretta Cool, BSN, RN, CWS, Kim on 12/27/2016  13:36:44 Wound Measurements Length: (cm) 4.2 % Reducti Width: (cm) 4.5 % Reducti Depth: (cm) 0.9 Epithelia Area: (cm) 14.844 Undermin Volume: (cm) 13.36 Start Ending Maximu on in Area: -162.5% on in Volume: -2264.6% lization: None ing: Yes ing Position (o'clock): 5 Position (o'clock): 10 m Distance: (cm) 1.3 Wound Description Classification: Category/Stage IV Foul Odor Wound Margin: Flat and Intact Due to Pr Exudate Amount: Large Slough/Fi Exudate Type: Serous Exudate Color: amber After Cleansing: Yes oduct Use: No brino Yes Wound Bed Granulation Amount: Medium (34-66%) Exposed Structure Granulation Quality: Red Fascia Exposed: No Necrotic Amount: Medium (34-66%) Fat Layer (Subcutaneous Tissue) Exposed: Yes Necrotic Quality: Adherent  Slough Tendon Exposed: No Muscle Exposed: Yes Necrosis of Muscle: No Joint Exposed: No Bone Exposed: No Mcelveen, Arien C. ([id number]) Periwound Skin Texture Texture Color No Abnormalities Noted: No No Abnormalities Noted: No Induration: Yes Erythema: Yes Erythema Location: Circumferential Moisture No Abnormalities Noted: No Temperature / Pain Temperature: No Abnormality Tenderness on Palpation: Yes Wound Preparation Ulcer Cleansing: Rinsed/Irrigated with Saline Topical Anesthetic Applied: Other: lidocaine 4%, Treatment Notes Wound #5 (Right Gluteus) 1. Cleansed with: Clean wound with Normal Saline 2. Anesthetic Topical Lidocaine 4% cream to wound bed prior to debridement 4. Dressing Applied: Other dressing (specify in notes) 5. Secondary Dressing Applied Bordered Foam Dressing Notes [employer] Electronic Signature(s) Signed: 12/27/2016 5:34:16 PM By: Gretta Cool, BSN, RN, CWS, Kim RN, BSN Entered By: Gretta Cool, BSN, RN, CWS, Kim on 12/26/2016 15:35:03 [place], Leslye Peer ([id number]) -------------------------------------------------------------------------------- Wound Assessment Details Patient Name: CHARLOTTE, BRAFFORD C. Date  of Service: 12/26/2016 3:30 PM Medical Record Number: [id number] Patient Account Number: [id number] Date of Birth/Sex: [birth year] (66 y.o. Male) Treating RN: Cornell Barman Primary Care Cailynn Bodnar: Otilio Miu Other Clinician: Referring Lily Kernen: Otilio Miu Treating Gaetano Romberger/Extender: Ricard Dillon Weeks in Treatment: 48 Wound Status Wound Number: 6 Primary Pressure Ulcer Etiology: Wound Location: Sacrum - Medial Wound Open Wounding Event: Pressure Injury Status: Date Acquired: 10/16/2016 Comorbid Coronary Artery Disease, Type II Diabetes, Weeks Of Treatment: 9 History: Quadriplegia, Seizure Disorder Clustered Wound: No Photos Photo Uploaded By: Gretta Cool, BSN, RN, CWS, Kim on 12/27/2016 13:37:11 Wound Measurements Length: (cm) 1.7 Width: (cm) 4 Depth: (cm) 0.1 Area: (cm) 5.341 Volume: (cm) 0.534 % Reduction in Area: 74.8% % Reduction in Volume: 74.8% Epithelialization: Small (1-33%) Wound Description Classification: Category/Stage II Wound Margin: Flat and Intact Exudate Amount: Medium Exudate Type: Serous Exudate Color: amber Foul Odor After Cleansing: No Slough/Fibrino No Wound Bed Granulation Amount: None Present (0%) Exposed Structure Necrotic Amount: Large (67-100%) Fascia Exposed: No Necrotic Quality: Eschar Fat Layer (Subcutaneous Tissue) Exposed: No Tendon Exposed: No Muscle Exposed: No Joint Exposed: No Bone Exposed: No Periwound Skin Texture Texture Color No Abnormalities Noted: No No Abnormalities Noted: No Erythema: Yes Moisture Dolinski, Bert C. ([id number]) No Abnormalities Noted: No Erythema Location: Circumferential Wound Preparation Ulcer Cleansing: Not Cleansed Topical Anesthetic Applied: None Treatment Notes Wound #6 (Medial Sacrum) 1. Cleansed with: Clean wound with Normal Saline 2. Anesthetic Topical Lidocaine 4% cream to wound bed prior to debridement 4. Dressing Applied: Other dressing (specify in notes) 5. Secondary Dressing  Applied Bordered Foam Dressing Notes [employer] Electronic Signature(s) Signed: 12/27/2016 11:06:40 AM By: Gretta Cool, BSN, RN, CWS, Kim RN, BSN Entered By: Gretta Cool, BSN, RN, CWS, Kim on 12/27/2016 11:06:39 Swaminathan, Leslye Peer ([id number]) -------------------------------------------------------------------------------- Wound Assessment Details Patient Name: JACK, BOLIO C. Date of Service: 12/26/2016 3:30 PM Medical Record Number: [id number] Patient Account Number: [id number] Date of Birth/Sex: [birth year] (66 y.o. Male) Treating RN: Cornell Barman Primary Care Quillan Whitter: Otilio Miu Other Clinician: Referring Jullien Granquist: Otilio Miu Treating Keyondre Hepburn/Extender: Ricard Dillon Weeks in Treatment: 48 Wound Status Wound Number: [id number] Primary Pressure Ulcer Etiology: Wound Location: Right Trochanter Wound Open Wounding Event: Pressure Injury Status: Date Acquired: 11/06/2016 Comorbid Coronary Artery Disease, Type II Diabetes, Weeks Of Treatment: 5 History: Quadriplegia, Seizure Disorder Clustered Wound: Yes Wound Measurements Length: (cm) 1.5 Width: (cm) 2.5 Depth: (cm) 0.1 Clustered Quantity: 2 Area: (cm) 2.945 Volume: (cm) 0.295 % Reduction in Area: -212.6% % Reduction in Volume: -213.8% Epithelialization: Small (1-33%) Tunneling: No Undermining: No Wound Description Classification: Unstageable/Unclassified Wound Margin: Flat and Intact Exudate Amount: Small Exudate Type: Serous Exudate Color:  amber Foul Odor After Cleansing: No Slough/Fibrino No Wound Bed Granulation Amount: None Present (0%) Necrotic Amount: None Present (0%) Periwound Skin Texture Texture Color No Abnormalities Noted: No No Abnormalities Noted: No Callus: No Atrophie Blanche: No Crepitus: No Cyanosis: No Excoriation: No Ecchymosis: No Induration: No Erythema: No Rash: No Hemosiderin Staining: No Scarring: No Mottled: No Pallor: No Moisture Rubor: No No Abnormalities Noted: No Dry /  Scaly: No Maceration: No Wound Preparation Ulcer Cleansing: Rinsed/Irrigated with Saline Topical Anesthetic Applied: Other: lidocaine 4%, Shewmake, Tahmid C. ([id number]) Treatment Notes Wound #[id number] (Right Trochanter) 1. Cleansed with: Clean wound with Normal Saline 2. Anesthetic Topical Lidocaine 4% cream to wound bed prior to debridement 4. Dressing Applied: Other dressing (specify in notes) 5. Secondary Dressing Applied Bordered Foam Dressing Notes [employer] Electronic Signature(s) Signed: 12/27/2016 5:34:16 PM By: Gretta Cool, BSN, RN, CWS, Kim RN, BSN Entered By: Gretta Cool, BSN, RN, CWS, Kim on 12/26/2016 15:42:35 Beiser, Leslye Peer ([id number]) -------------------------------------------------------------------------------- Vitals Details Patient Name: DELIA, SITAR C. Date of Service: 12/26/2016 3:30 PM Medical Record Number: [id number] Patient Account Number: [id number] Date of Birth/Sex: [birth year] (66 y.o. Male) Treating RN: Cornell Barman Primary Care Malekai Markwood: Otilio Miu Other Clinician: Referring Lateasha Breuer: Otilio Miu Treating Ladine Kiper/Extender: Ricard Dillon Weeks in Treatment: 48 Vital Signs Time Taken: 14:30 Temperature (F): 97.5 Height (in): 70 Pulse (bpm): 82 Weight (lbs): 187 Respiratory Rate (breaths/min): 16 Body Mass Index (BMI): 26.8 Blood Pressure (mmHg): 161/61 Reference Range: 80 - 120 mg / dl Electronic Signature(s) Signed: 12/27/2016 5:34:16 PM By: Gretta Cool, BSN, RN, CWS, Kim RN, BSN Entered By: Gretta Cool, BSN, RN, CWS, Kim on 12/26/2016 15:31:00

## 2017-01-01 ENCOUNTER — Ambulatory Visit: Payer: Medicare Other | Admitting: Internal Medicine

## 2017-01-02 ENCOUNTER — Encounter: Payer: Medicare Other | Attending: Physician Assistant | Admitting: Internal Medicine

## 2017-01-02 DIAGNOSIS — L89322 Pressure ulcer of left buttock, stage 2: Secondary | ICD-10-CM | POA: Diagnosis not present

## 2017-01-02 DIAGNOSIS — G8252 Quadriplegia, C1-C4 incomplete: Secondary | ICD-10-CM | POA: Diagnosis not present

## 2017-01-02 DIAGNOSIS — I959 Hypotension, unspecified: Secondary | ICD-10-CM | POA: Insufficient documentation

## 2017-01-02 DIAGNOSIS — S14103S Unspecified injury at C3 level of cervical spinal cord, sequela: Secondary | ICD-10-CM | POA: Diagnosis not present

## 2017-01-02 DIAGNOSIS — S51011A Laceration without foreign body of right elbow, initial encounter: Secondary | ICD-10-CM | POA: Diagnosis not present

## 2017-01-02 DIAGNOSIS — L89310 Pressure ulcer of right buttock, unstageable: Secondary | ICD-10-CM | POA: Insufficient documentation

## 2017-01-02 DIAGNOSIS — F329 Major depressive disorder, single episode, unspecified: Secondary | ICD-10-CM | POA: Insufficient documentation

## 2017-01-02 DIAGNOSIS — Z7984 Long term (current) use of oral hypoglycemic drugs: Secondary | ICD-10-CM | POA: Insufficient documentation

## 2017-01-02 DIAGNOSIS — X58XXXS Exposure to other specified factors, sequela: Secondary | ICD-10-CM | POA: Insufficient documentation

## 2017-01-02 DIAGNOSIS — E119 Type 2 diabetes mellitus without complications: Secondary | ICD-10-CM | POA: Diagnosis not present

## 2017-01-02 DIAGNOSIS — K219 Gastro-esophageal reflux disease without esophagitis: Secondary | ICD-10-CM | POA: Insufficient documentation

## 2017-01-02 DIAGNOSIS — L03317 Cellulitis of buttock: Secondary | ICD-10-CM | POA: Insufficient documentation

## 2017-01-02 DIAGNOSIS — Z8673 Personal history of transient ischemic attack (TIA), and cerebral infarction without residual deficits: Secondary | ICD-10-CM | POA: Diagnosis not present

## 2017-01-02 DIAGNOSIS — R569 Unspecified convulsions: Secondary | ICD-10-CM | POA: Diagnosis not present

## 2017-01-02 DIAGNOSIS — L89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, stage 3: Secondary | ICD-10-CM | POA: Diagnosis not present

## 2017-01-02 DIAGNOSIS — L89314 Pressure ulcer of right buttock, stage 4: Secondary | ICD-10-CM | POA: Diagnosis not present

## 2017-01-02 DIAGNOSIS — L89152 Pressure ulcer of sacral region, stage 2: Secondary | ICD-10-CM | POA: Diagnosis not present

## 2017-01-04 NOTE — Progress Notes (Addendum)
[name] ([id number]) Visit Report for 01/02/2017 Debridement Details Patient Name: [name], [name]. Date of Service: 01/02/2017 9:00 AM Medical Record Number: [id number] Patient Account Number: [id number] Date of Birth/Sex: [birth year] (66 y.o. Male) Treating RN: Huel Coventry Primary Care Provider: Elizabeth Sauer Other Clinician: Referring Provider: Elizabeth Sauer Treating Provider/Extender: Altamese [place] in Treatment: 75 Debridement Performed for Wound #5 Right Gluteus Assessment: Performed By: Physician Maxwell Caul, MD Debridement: Debridement Pre-procedure Verification/Time Yes - 09:55 Out Taken: Start Time: 09:56 Pain Control: Other : lidocaine 4% Level: Skin/Subcutaneous Tissue/Muscle Total Area Debrided (L x W): 4.5 (cm) x 4.5 (cm) = 20.25 (cm) Tissue and other material Viable, Non-Viable, Fibrin/Slough, Muscle, Subcutaneous debrided: Instrument: Curette, Forceps Bleeding: Minimum Hemostasis Achieved: Pressure End Time: 09:58 Procedural Pain: 2 Post Procedural Pain: 0 Response to Treatment: Procedure was tolerated well Post Debridement Measurements of Total Wound Length: (cm) 4.5 Stage: Category/Stage IV Width: (cm) 4.5 Depth: (cm) 1.5 Volume: (cm) 23.856 Character of Wound/Ulcer Post Requires Further Debridement Debridement: Post Procedure Diagnosis Same as Pre-procedure Electronic Signature(s) Signed: 01/02/2017 4:19:52 PM By: Elliot Gurney, BSN, RN, CWS, Kim RN, BSN Signed: 01/02/2017 5:39:07 PM By: Baltazar Najjar MD Entered By: Baltazar Najjar on 01/02/2017 10:41:01 Mulvey, [name] ([id number]) -------------------------------------------------------------------------------- Debridement Details Patient Name: [name], [name] Date of Service: 01/02/2017 9:00 AM Medical Record Number: [id number] Patient Account Number: [id number] Date of Birth/Sex: 1950-10-13 (65 y.o. Male) Treating RN: Huel Coventry Primary Care Provider: Elizabeth Sauer Other  Clinician: Referring Provider: Elizabeth Sauer Treating Provider/Extender: Altamese [place] in Treatment: 93 Debridement Performed for Wound #6 Medial Sacrum Assessment: Performed By: Physician Maxwell Caul, MD Debridement: Debridement Pre-procedure Verification/Time Yes - 09:55 Out Taken: Start Time: 09:56 Pain Control: Other : lidocaine 4% Level: Skin/Subcutaneous Tissue/Muscle Total Area Debrided (L x W): 1.8 (cm) x 5.7 (cm) = 10.26 (cm) Tissue and other material Viable, Non-Viable, Fibrin/Slough, Muscle, Subcutaneous debrided: Instrument: Curette, Forceps Bleeding: Minimum Hemostasis Achieved: Pressure End Time: 09:58 Procedural Pain: 2 Post Procedural Pain: 0 Response to Treatment: Procedure was tolerated well Post Debridement Measurements of Total Wound Length: (cm) 2 Stage: Category/Stage III Width: (cm) 6 Depth: (cm) 0.5 Volume: (cm) 4.712 Character of Wound/Ulcer Post Requires Further Debridement Debridement: Post Procedure Diagnosis Same as Pre-procedure Electronic Signature(s) Signed: 01/10/2017 3:13:08 PM By: Elliot Gurney, BSN, RN, CWS, Kim RN, BSN Signed: 01/16/2017 8:07:00 AM By: Baltazar Najjar MD Previous Signature: 01/02/2017 4:19:52 PM Version By: Elliot Gurney BSN, RN, CWS, Kim RN, BSN Previous Signature: 01/02/2017 5:39:07 PM Version By: Baltazar Najjar MD Entered By: Elliot Gurney, BSN, RN, CWS, Kim on 01/10/2017 15:13:08 Riederer, [name] ([id number]) -------------------------------------------------------------------------------- HPI Details Patient Name: [name], [name] Date of Service: 01/02/2017 9:00 AM Medical Record Number: [id number] Patient Account Number: [id number] Date of Birth/Sex: 1950-06-12 (66 y.o. 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 He has lower extremity sensation and can move his legs but has no/limited control over his arms. His wife accompanies him today and states that in the late spring or early summer of 2017 the patient became very depressed. He refused the refused to mobilize and he developed several pressure ulcers on his back. Most of these have healed however they have a recalcitrant area over the  right scapula. They've been using Santyl on this for at least the last month. In terms of depression the patient is doing better now on an antidepressant. He saw his primary physician on 01/12/16 at which time there was apparently green drainage coming out of this area [Dr. Deana Jones]. Dr. Yetta Barre works in the [facility]. He has completed this Septra. Otherwise looking through [facility] notes that he has a history of seizures. He also had a stroke in 2008 he follows with neurology. He also has type 2 diabetes on Glucophage, hyperlipidemia and gastroesophageal reflux. He takes Plavix for stroke prevention and Keppra for seizure prophylaxis. 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 02/08/16; this is a patient with a pressure ulcer over the right scapula. His wife has been doing the dressing with Santyl and border foam change every second day. When he arrived here 2 weeks ago we did a fairly extensive mechanical debridement. We are asked medical modalities to go out to the home and see what they might be eligible for in terms of pressure-relief surfaces [level 2] but the wife states that they have not heard from them. 03/20/15; this is a patient we haven't seen in 5-6 weeks. This was largely due to transportation issues. They've been using Santyl and border foam changing every second day for a  pressure injury over the right scapula. The patient has a C3-C4 spinal injury. We had also asked for a review by the people who supplied DME to look at his wheelchair cushion, mattress etc. I don't know that this ever happened. In the meantime the wound has done remarkably well current measurements 1.8 x 2 x 0.1 04/03/16; the patient's dimensions have gone up to 3 cm in diameter quite a deterioration from last time. He is also complaining of pain in this area which is new. 04/10/16; 3 x 1.5 x 0.1. No difference from last week. Culture I did last week was negative he has completed antibiotics. 04/24/16 2.4 x 2 x 0.1; wound generally looks smaller. Middle area that I had to debrided last time looks healthier. His son is fashioned a large piece of foam cut out where the patient's wound with hit the back of his wheelchair 05/01/16; wound is a same size however the surface of this looks better. The middle innkeeper area required a repeat debridement 05/15/16; wound is down and dimensions granulation still looks healthy. The medial aspect of this wound is now the deeper [place]ot. We'll see how this responds to further healing. We're using Hydrofera Blue 06/05/16; Wound 1.7x1.1x0.1 still using hyudrofera blue 06/26/16; the patient arrives back in clinic after a three-week hiatus. He was hospitalized from 06/09/16 through 06/10/16. He was found to be confused. CT scan of the head showed nothing really acute. His sodium was low at 131. He was rehydrated. He was felt to have a UTI and given antibiotics and antibiotics at discharge although his final culture result only showed multiple organisms. His wife says today that at the time of the hospitalization they discovered a large intact blister over the large aspect of his left calcaneus. This is recently ruptured. The wound on his right scapula is somewhat larger. More his wife is concerned about his current status. She states that he is still confused sleeps for long  periods. He is not having fever chills cough or diarrhea [1 loose bowel movement per day]. He continues to  have a suprapubic catheter. She notes that he is not eating and drinking well. Patient states he just does not want to eat. He does not feel nauseated or vomit. 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 Admission sodium was 131 at discharge 139 BUN 22 and 1.22 at admission, 17 and 1 at discharge. 07/03/16; patient's mental status is back to normal. He has a new wound on the right elbow caused by traumatizing his elbow against the wall apparently at the dermatologist office last week. We continue with the original wound on the right scapula and the wound from 2 weeks ago on his left heel. We have been using silver alginate to the area on the heel. ROWLAND, ERICSSON ([id number]) 08/14/16; patient has not been here in almost 6 weeks. When he was here last time he had the pressure ulcer on the right scapula, atraumatic wound on the right lateral elbow and an area on his left heel. His wife states that at one point all of these were healed and she didn't really feel he needed to come back here. Since then the patient has developed a reopening of the area on the right scapula, a stage II wound on the left buttock and a reopening of the area on the right lateral elbow. As usual his wife as a litany of complaints against home health, she is dismissing or is going to dismiss well care. She tells [place] she has a long list of supplies at home already for some reason they were not felt to be eligible for a group 2 or 3 surface although they have a hospital bed at home but no offloading surface 08/28/16; the patient arrived today unfortunately incontinent of stool in spite of this the wounds all appear to be better including the right scapular area, his left buttock's left heel and the right lateral elbow. 09/18/16; this is a  patient who arrived today for follow-up of a pressure area over his right scapula area, left buttock and there was an area on his right lateral elbow. He arrived in clinic today with a worrisome area over the right initial tuberosity/right buttock. This had a necrotic surface with draining purulence. He has not been systemically unwell. The area over the left elbow healed. He arrived in clinic today with dressing that hadn't been changed over the scapula for 2 or 3 days per her intake. His wife is previously fired home health 09/25/16;; the last time the patient was here he arrived with a new grossly infected wound over the right ischial tuberosity. Culture I did not show a specific pathogen. I did think this required admission to hospital and he was admitted from 09/18/16 through 09/20/16. Initially given vanc and Zosyn but then discharged with 5 days' worth of Augmentin. An MRI was done that did not show osteomyelitis of the right ischial tuberosity however it did show cellulitis and possible myositis. He also has wounds on the left ischial tuberosity and the area over the right scapular area. 10/04/16 on evaluation today patient appears to be doing better in regard to his back wound and is stable in regard to the gluteal wounds. There does not appear to be any evidence of significant infection at this point. He is tolerating the dressing changes currently. His wife has been performing the dressing changes. Nonetheless he does have some discomfort especially in the gluteal areas although he did not specifically rate  the discomfort there were times during evaluation where he flinched and it was obvious it was bothering him. No fevers, chills, nausea, or vomiting noted at this time. 10/17/16; the patient has bilateral ischial tuberosity wounds right greater than left. The left seems to be making good progress wears the right has not really changed that much and dimensions. There is also some threatened  area on the right side around the wound circumference. Equally worrisome there is a DTI over the lower sacral area however this is not open as of yet. His area over the left scapula which is the chronic wound he was coming to the clinic continues to make excellent progress 8/28 The patient comes in today wanting to talk about getting back in his wheelchair. He is very bored lying in bed at home 11/14/16; three-week hiatus for this patient for medical illnesses whether etc. He was in the emergency room at [place] 2 days ago when his wife noted odor coming out of the right initial tuberosity wound and discoloration. I reviewed his ER presentation from 11/12/16. White count was 11.7 differential count reasonably normal basic metabolic panel was normal. Serum lactate was 1.9. He has a suprapubic catheter which showed positive urine nitrate large leukocytes and many bacteria. Urine cultures come back showing multiple species. Blood culture was negative. Wound culture is still pending. He was put on Keflex and Bactrim. The patient had an MRI of this area on 09/18/16 that did not show osteomyelitis however at that time there was possible cellulitis and infectious myositis no drainable fluid collection. After this he wishes admitted the hospital had vancomycin and Zosyn for 5 days and then 5 days of Augmentin 11/20/16; culture of the wound that I did last week showed multiple organisms. This was the same as from the emergency room as it turns out. He is still on Keflex and Bactrim and completing this. I was really expecting something a little more ominous although he seems to have stabilized. We have been using silver alginate to the major wound on the right ischial tuberosity. He had a DTI over the right greater trochanter, superficial wound over the left ischial tuberosity and a DTI on the superior part of the left hemipelvis. FORTUNATELY everything looks a little better here than last week. At my suggestion  his wife looked into [facility] but was told that he needed a 3 day acute hospital stay. This would be similar to what is required for skilled nursing and I was not specifically aware of this although it may have something to do with the patient's specific insurance 11/27/16; he has completed his antibiotics. All of his wounds appeared to have stabilized. This includes the necrotic right ischial tuberosity wound, DTI on the superior pelvis, DTI over the left greater trochanter. We have been using Aquacel Ag to all wound areas. He arrives in clinic today with a low temperature at first not registering at all then 91.5. After serial checks with different instruments we got this up to 94 although the patient looks fine is mentating normally. His wife reports that this is often the pattern when he goes to the hospital and that they'll put him in a heating blanket and send him home although I don't really see evidence of this on the most recent ER trips. She states she has a warming blanket and a rectal for mom under the registers down to 91. I've asked her to check his temperature when she goes home. They both seemed very  reluctant to go to [facility] ([id number]) the emergency department over this and to be truthful he looks stable enough to avoid this at least for now 12/03/16; patient arrives with all of his wounds and a considerably better situation. His wife states she has been changing him and turning him. The major remaining wound is in the right ischial tuberosity over even it looks well granulated and the copious amounts of necrotic tissue that I took out several weeks ago with coexistent infection looks to be resolved. We have been using silver alginate to all wounds 12/18/16; patient arrives for his two-week follow-up as usual accompanied by his wife. Unfortunately things have not gone well. They tell me that as of Friday evening he started to complain of severe pain in  the lower buttock area presumably around the wounds although I'm not completely certain of that. This is different for this patient to rarely complains of any pain. They also noted significant odor to the wounds. We have been using silver alginate. He has not had any falls. No fever [ or hypothermia]. He was at his primary doctor in [place] today and had lab work done. 12/26/16; patient's deep tissue culture from last week grew Proteus and MRSA. He started Septra DS and Augmentin late 2 days ago. He is not spontaneously complaining of as much pain as last week although the patient's wife states when she changes the dressing he complains of pain. He apparently refused to go and have x-rays of the pelvis done to look at the right ischial tuberosity specifically. We have been using silver alginate largely because of the necrotic wound in the lower part of the right ischial tuberosity. He has not been systemically unwell 01/02/17; patient's pelvic x-ray did not show underlying bony destruction. He still has considerable amount of necrotic debris in the lower part of his right ischial tuberosity. The lower sacral wound and left ischial tuberosity also have very necrotic surfaces. He has been on Septra and Augmentin purulent drainage out of the right ischial tuberosity and they're completing 2 weeks of therapy which I would like to extend for another 2 weeks. He would not be an easy candidate for an MRI of the right pelvis although this may become necessary. ROS; a 10 point review of systems is negative. Specifically no diarrhea fever or chills Electronic Signature(s) Signed: 01/02/2017 5:39:07 PM By: Baltazar Najjar MD Entered By: Baltazar Najjar on 01/02/2017 10:43:15 Mangine, [name] ([id number]) -------------------------------------------------------------------------------- Physical Exam Details Patient Name: Bagot, [name] Date of Service: 01/02/2017 9:00 AM Medical Record Number:  [id number] Patient Account Number: [id number] Date of Birth/Sex: May 20, 1950 (66 y.o. Male) Treating RN: Huel Coventry Primary Care Provider: Elizabeth Sauer Other Clinician: Referring Provider: Elizabeth Sauer Treating Provider/Extender: Maxwell Caul Weeks in Treatment: 55 Constitutional Sitting or standing Blood Pressure is within target range for patient.. Pulse regular and within target range for patient.Marland Kitchen Respirations regular, non-labored and within target range.. Temperature is normal and within the target range for the patient.Marland Kitchen appears in no distress. Notes wound exam; the lower third of the right ischial tuberosity continues to have copious amounts of necrotic material although there is less surrounding erythema and no purulent drainage. Using a #5 curet and pickups I removed copious amounts of necrotic muscle. There is no exposed bone although I can't exclude this at this point. oNecrotic surface material also removed from the surface of the lower sacral wound and left ischial tuberosity wound and these are more superficial than the right  ischial tuberosity although I still am not down to healthy healing base. Electronic Signature(s) Signed: 01/02/2017 5:39:07 PM By: Baltazar Najjar MD Entered By: Baltazar Najjar on 01/02/2017 10:44:31 Berman, [name] ([id number]) -------------------------------------------------------------------------------- Physician Orders Details Patient Name: EVENS, [name] Date of Service: 01/02/2017 9:00 AM Medical Record Number: [id number] Patient Account Number: [id number] Date of Birth/Sex: 1950/04/27 (66 y.o. Male) Treating RN: Huel Coventry Primary Care Provider: Elizabeth Sauer Other Clinician: Referring Provider: Elizabeth Sauer Treating Provider/Extender: Altamese [place] in Treatment: 88 Verbal / Phone Orders: Yes Clinician: Huel Coventry Read Back and Verified: Yes Diagnosis Coding Wound Cleansing Wound #4 Left Gluteus o Clean wound with  Normal Saline. o Cleanse wound with mild soap and water Wound #5 Right Gluteus o Clean wound with Normal Saline. o Cleanse wound with mild soap and water Wound #6 Medial Sacrum o Clean wound with Normal Saline. o Cleanse wound with mild soap and water Anesthetic Wound #4 Left Gluteus o Topical Lidocaine 4% cream applied to wound bed prior to debridement Wound #5 Right Gluteus o Topical Lidocaine 4% cream applied to wound bed prior to debridement Wound #6 Medial Sacrum o Topical Lidocaine 4% cream applied to wound bed prior to debridement Skin Barriers/Peri-Wound Care Wound #4 Left Gluteus o Skin Prep Wound #5 Right Gluteus o Skin Prep Wound #6 Medial Sacrum o Skin Prep Primary Wound Dressing Wound #4 Left Gluteus o Aquacel Ag Wound #5 Right Gluteus o Aquacel Ag Wound #6 Medial Sacrum o Aquacel Ag Secondary Dressing Piascik, Jarrah C. ([id number]) Wound #4 Left Gluteus o Boardered Foam Dressing Wound #5 Right Gluteus o Boardered Foam Dressing Wound #6 Medial Sacrum o Boardered Foam Dressing Dressing Change Frequency Wound #4 Left Gluteus o Change dressing every other day. - daily if needed Wound #5 Right Gluteus o Change dressing every other day. - daily if needed Wound #6 Medial Sacrum o Change dressing every other day. - daily if needed Follow-up Appointments Wound #4 Left Gluteus o Return Appointment in 1 week. Wound #5 Right Gluteus o Return Appointment in 1 week. Wound #6 Medial Sacrum o Return Appointment in 1 week. Off-Loading Wound #4 Left Gluteus o Roho cushion for wheelchair o Turn and reposition every 2 hours Wound #5 Right Gluteus o Roho cushion for wheelchair o Turn and reposition every 2 hours Wound #6 Medial Sacrum o Roho cushion for wheelchair o Turn and reposition every 2 hours Additional Orders / Instructions Wound #4 Left Gluteus o Increase protein intake. Wound #5 Right  Gluteus o Increase protein intake. Wound #6 Medial Sacrum o Increase protein intake. Medications-please add to medication list. Wound #4 Left Gluteus o Other: - Vitamins A, C and Zinc Mower, Paiton C. ([id number]) Wound #5 Right Gluteus o Other: - Vitamins A, C and Zinc Wound #6 Medial Sacrum o Other: - Vitamins A, C and Zinc Patient Medications Allergies: NKDA Notifications Medication Indication Start End Augmentin wound infection 01/02/2017 DOSE oral 875 mg-125 mg tablet - 1 tablet oral po bid for an additional 2 weeks sulfamethoxazole-trimethoprim wound infection 01/02/2017 DOSE oral 800 mg-160 mg tablet - 1 tablet oral bid for an additional 2 weeks Electronic Signature(s) Signed: 01/02/2017 10:49:12 AM By: Baltazar Najjar MD Entered By: Baltazar Najjar on 01/02/2017 10:49:11 Tidd, [name] ([id number]) -------------------------------------------------------------------------------- Problem List Details Patient Name: [name] Bras C. Date of Service: 01/02/2017 9:00 AM Medical Record Number: [id number] Patient Account Number: [id number] Date of Birth/Sex: Oct 02, 1950 (66 y.o. Male) Treating RN: Huel Coventry Primary Care Provider: Elizabeth Sauer Other Clinician: Referring Provider:  Elizabeth Sauer Treating Provider/Extender: Maxwell Caul Weeks in Treatment: 44 Active Problems ICD-10 Encounter Code Description Active Date Diagnosis 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, stage 3 01/24/2016 Yes S14.103S Unspecified injury at C3 level of cervical spinal cord, sequela 01/24/2016 Yes S51.011A Laceration without foreign body of right elbow, initial encounter 07/03/2016 Yes L89.322 Pressure ulcer of left buttock, stage 2 08/14/2016 Yes L89.310 Pressure ulcer of right buttock, unstageable 09/18/2016 Yes L03.317 Cellulitis of buttock 09/18/2016 Yes Inactive Problems Resolved Problems ICD-10 Code Description Active Date Resolved Date L89.622 Pressure ulcer of left heel, stage  2 06/26/2016 06/26/2016 Electronic Signature(s) Signed: 01/02/2017 5:39:07 PM By: Baltazar Najjar MD Entered By: Baltazar Najjar on 01/02/2017 10:40:44 Vandenberg, [name] ([id number]) -------------------------------------------------------------------------------- Progress Note Details Patient Name: [name] Bras C. Date of Service: 01/02/2017 9:00 AM Medical Record Number: [id number] Patient Account Number: [id number] Date of Birth/Sex: [birth year] (66 y.o. 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 He has lower extremity sensation and can move his legs but has no/limited control over his arms. His wife accompanies him today and states that in the late spring or early summer of 2017 the patient became very depressed. He refused the refused to mobilize and he developed several pressure ulcers on his back. Most of these have healed however they have a recalcitrant area over the right scapula. They've been using Santyl on this for at least the last month. In terms of depression the patient is doing better now on an antidepressant. He saw his primary physician on 01/12/16 at which time there was apparently green drainage coming out of this area [Dr. Deana Jones]. Dr. Yetta Barre works in the [facility]. He has completed this Septra. Otherwise looking through [facility] notes that he has a history of seizures. He also had a stroke in 2008 he follows with neurology. He also has type 2 diabetes on Glucophage, hyperlipidemia and gastroesophageal reflux. He takes Plavix for stroke prevention and Keppra for seizure prophylaxis. 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 02/08/16; this is a patient with a pressure ulcer over the right scapula. His wife has been doing the dressing with Santyl and border foam change every second day. When he arrived here 2 weeks ago we did a fairly extensive mechanical debridement. We are asked medical modalities to go out to the home and see what they might be eligible for in terms of pressure-relief surfaces [level 2] but the wife states that they have not heard from them. 03/20/15; this is a patient we haven't seen in 5-6 weeks. This was largely due to transportation issues. They've been using Santyl and border foam changing every second day for a pressure injury over the right scapula. The patient has a C3-C4 spinal injury. We had also asked for a review by the people who supplied DME to look at his wheelchair cushion, mattress etc. I don't know that this ever happened. In the meantime the wound has done remarkably well current measurements 1.8 x 2 x 0.1 04/03/16; the patient's dimensions have gone up to 3 cm in diameter quite a deterioration from last time. He is also complaining  of pain in this area which is new. 04/10/16; 3 x 1.5 x 0.1. No difference from last week. Culture I did last week was negative he has completed antibiotics. 04/24/16 2.4 x 2 x 0.1; wound generally looks smaller. Middle area that I had to debrided last time looks healthier. His son is fashioned a large piece of foam cut out where the patient's wound with hit the back of his wheelchair 05/01/16; wound is a same size however the surface of this looks better. The middle innkeeper area required a repeat debridement 05/15/16; wound is down and dimensions granulation still looks healthy. The medial aspect of this wound is now the deeper Divot. We'll see how this responds to further healing. We're using Hydrofera Blue 06/05/16; Wound 1.7x1.1x0.1 still using hyudrofera blue 06/26/16; the  patient arrives back in clinic after a three-week hiatus. He was hospitalized from 06/09/16 through 06/10/16. He was found to be confused. CT scan of the head showed nothing really acute. His sodium was low at 131. He was rehydrated. He was felt to have a UTI and given antibiotics and antibiotics at discharge although his final culture result only showed multiple organisms. His wife says today that at the time of the hospitalization they discovered a large intact blister over the large aspect of his left calcaneus. This is recently ruptured. The wound on his right scapula is somewhat larger. More his wife is concerned about his current status. She states that he is still confused sleeps for long periods. He is not having fever chills cough or diarrhea [1 loose bowel movement per day]. He continues to have a suprapubic catheter. She notes that he is not eating and drinking well. Patient states he just does not want to eat. He does not feel nauseated or vomit. 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 Admission sodium was 131 at discharge 139 BUN 22 and 1.22 at admission, 17 and 1 at discharge. 07/03/16; patient's mental status is back to normal. He has a new wound on the right elbow caused by traumatizing his elbow against the wall apparently at the dermatologist office last week. We continue with the original wound on the right scapula and Bayon, Kelwin C. ([id number]) the wound from 2 weeks ago on his left heel. We have been using silver alginate to the area on the heel. 08/14/16; patient has not been here in almost 6 weeks. When he was here last time he had the pressure ulcer on the right scapula, atraumatic wound on the right lateral elbow and an area on his left heel. His wife states that at one point all of these were healed and she didn't really feel he needed to come back here. Since then the patient has  developed a reopening of the area on the right scapula, a stage II wound on the left buttock and a reopening of the area on the right lateral elbow. As usual his wife as a litany of complaints against home health, she is dismissing or is going to dismiss well care. She tells [place] she has a long list of supplies at home already for some reason they were not felt to be eligible for a group 2 or 3 surface although they have a hospital bed at home but no offloading surface 08/28/16; the patient arrived today unfortunately incontinent of stool in spite of this the wounds all appear to be better including the right scapular  area, his left buttock's left heel and the right lateral elbow. 09/18/16; this is a patient who arrived today for follow-up of a pressure area over his right scapula area, left buttock and there was an area on his right lateral elbow. He arrived in clinic today with a worrisome area over the right initial tuberosity/right buttock. This had a necrotic surface with draining purulence. He has not been systemically unwell. The area over the left elbow healed. He arrived in clinic today with dressing that hadn't been changed over the scapula for 2 or 3 days per her intake. His wife is previously fired home health 09/25/16;; the last time the patient was here he arrived with a new grossly infected wound over the right ischial tuberosity. Culture I did not show a specific pathogen. I did think this required admission to hospital and he was admitted from 09/18/16 through 09/20/16. Initially given vanc and Zosyn but then discharged with 5 days' worth of Augmentin. An MRI was done that did not show osteomyelitis of the right ischial tuberosity however it did show cellulitis and possible myositis. He also has wounds on the left ischial tuberosity and the area over the right scapular area. 10/04/16 on evaluation today patient appears to be doing better in regard to his back wound and is stable in regard to  the gluteal wounds. There does not appear to be any evidence of significant infection at this point. He is tolerating the dressing changes currently. His wife has been performing the dressing changes. Nonetheless he does have some discomfort especially in the gluteal areas although he did not specifically rate the discomfort there were times during evaluation where he flinched and it was obvious it was bothering him. No fevers, chills, nausea, or vomiting noted at this time. 10/17/16; the patient has bilateral ischial tuberosity wounds right greater than left. The left seems to be making good progress wears the right has not really changed that much and dimensions. There is also some threatened area on the right side around the wound circumference. Equally worrisome there is a DTI over the lower sacral area however this is not open as of yet. His area over the left scapula which is the chronic wound he was coming to the clinic continues to make excellent progress 8/28 The patient comes in today wanting to talk about getting back in his wheelchair. He is very bored lying in bed at home 11/14/16; three-week hiatus for this patient for medical illnesses whether etc. He was in the emergency room at [facility] 2 days ago when his wife noted odor coming out of the right initial tuberosity wound and discoloration. I reviewed his ER presentation from 11/12/16. White count was 11.7 differential count reasonably normal basic metabolic panel was normal. Serum lactate was 1.9. He has a suprapubic catheter which showed positive urine nitrate large leukocytes and many bacteria. Urine cultures come back showing multiple species. Blood culture was negative. Wound culture is still pending. He was put on Keflex and Bactrim. The patient had an MRI of this area on 09/18/16 that did not show osteomyelitis however at that time there was possible cellulitis and infectious myositis no drainable fluid collection. After this he  wishes admitted the hospital had vancomycin and Zosyn for 5 days and then 5 days of Augmentin 11/20/16; culture of the wound that I did last week showed multiple organisms. This was the same as from the emergency room as it turns out. He is still on Keflex and Bactrim and completing  this. I was really expecting something a little more ominous although he seems to have stabilized. We have been using silver alginate to the major wound on the right ischial tuberosity. He had a DTI over the right greater trochanter, superficial wound over the left ischial tuberosity and a DTI on the superior part of the left hemipelvis. FORTUNATELY everything looks a little better here than last week. At my suggestion his wife looked into [facility] but was told that he needed a 3 day acute hospital stay. This would be similar to what is required for skilled nursing and I was not specifically aware of this although it may have something to do with the patient's specific insurance 11/27/16; he has completed his antibiotics. All of his wounds appeared to have stabilized. This includes the necrotic right ischial tuberosity wound, DTI on the superior pelvis, DTI over the left greater trochanter. We have been using Aquacel Ag to all wound areas. He arrives in clinic today with a low temperature at first not registering at all then 91.5. After serial checks with different instruments we got this up to 94 although the patient looks fine is mentating normally. His wife reports that this is often the pattern when he goes to the hospital and that they'll put him in a heating blanket and send him home although I don't really see evidence of this on the most recent ER trips. She states she has a warming blanket and a rectal for mom under the [facility], Bruce C. ([id number]) registers down to 91. I've asked her to check his temperature when she goes home. They both seemed very reluctant to go to the emergency department  over this and to be truthful he looks stable enough to avoid this at least for now 12/03/16; patient arrives with all of his wounds and a considerably better situation. His wife states she has been changing him and turning him. The major remaining wound is in the right ischial tuberosity over even it looks well granulated and the copious amounts of necrotic tissue that I took out several weeks ago with coexistent infection looks to be resolved. We have been using silver alginate to all wounds 12/18/16; patient arrives for his two-week follow-up as usual accompanied by his wife. Unfortunately things have not gone well. They tell me that as of Friday evening he started to complain of severe pain in the lower buttock area presumably around the wounds although I'm not completely certain of that. This is different for this patient to rarely complains of any pain. They also noted significant odor to the wounds. We have been using silver alginate. He has not had any falls. No fever [ or hypothermia]. He was at his primary doctor in [place] today and had lab work done. 12/26/16; patient's deep tissue culture from last week grew Proteus and MRSA. He started Septra DS and Augmentin late 2 days ago. He is not spontaneously complaining of as much pain as last week although the patient's wife states when she changes the dressing he complains of pain. He apparently refused to go and have x-rays of the pelvis done to look at the right ischial tuberosity specifically. We have been using silver alginate largely because of the necrotic wound in the lower part of the right ischial tuberosity. He has not been systemically unwell 01/02/17; patient's pelvic x-ray did not show underlying bony destruction. He still has considerable amount of necrotic debris in the lower part of his right ischial tuberosity. The lower  sacral wound and left ischial tuberosity also have very necrotic surfaces. He has been on Septra and Augmentin  purulent drainage out of the right ischial tuberosity and they're completing 2 weeks of therapy which I would like to extend for another 2 weeks. He would not be an easy candidate for an MRI of the right pelvis although this may become necessary. ROS; a 10 point review of systems is negative. Specifically no diarrhea fever or chills Objective Constitutional Sitting or standing Blood Pressure is within target range for patient.. Pulse regular and within target range for patient.Marland Kitchen Respirations regular, non-labored and within target range.. Temperature is normal and within the target range for the patient.Marland Kitchen appears in no distress. Vitals Time Taken: 9:06 AM, Height: 70 in, Weight: 187 lbs, BMI: 26.8, Temperature: 97.8 F, Pulse: 98 bpm, Respiratory Rate: 16 breaths/min, Blood Pressure: 123/81 mmHg. General Notes: wound exam; the lower third of the right ischial tuberosity continues to have copious amounts of necrotic material although there is less surrounding erythema and no purulent drainage. Using a #5 curet and pickups I removed copious amounts of necrotic muscle. There is no exposed bone although I can't exclude this at this point. Necrotic surface material also removed from the surface of the lower sacral wound and left ischial tuberosity wound and these are more superficial than the right ischial tuberosity although I still am not down to healthy healing base. Integumentary (Hair, Skin) Wound #4 status is Open. Original cause of wound was Pressure Injury. The wound is located on the Left Gluteus. The wound measures 2cm length x 2.2cm width x 0.1cm depth; 3.456cm^2 area and 0.346cm^3 volume. Wound #5 status is Open. Original cause of wound was Pressure Injury. The wound is located on the Right Gluteus. The wound measures 4.5cm length x 4.5cm width x 1cm depth; 15.904cm^2 area and 15.904cm^3 volume. There is muscle and Fat Layer (Subcutaneous Tissue) Exposed exposed. There is undermining  starting at 6:00 and ending at 12:00 with a maximum distance of 1.5cm. There is a large amount of serous drainage noted. Foul odor after cleansing was noted. The wound margin is flat and intact. There is medium (34-66%) red granulation within the wound bed. There is a medium (34- 66%) amount of necrotic tissue within the wound bed including Adherent Slough. The periwound skin appearance exhibited: Nolt, Brittany C. ([id number]) Induration, Erythema. The surrounding wound skin color is noted with erythema which is circumferential. Periwound temperature was noted as No Abnormality. The periwound has tenderness on palpation. Wound #6 status is Open. Original cause of wound was Pressure Injury. The wound is located on the Medial Sacrum. The wound measures 1.8cm length x 5.7cm width x 0.1cm depth; 8.058cm^2 area and 0.806cm^3 volume. Wound #7R status is Healed - Epithelialized. Original cause of wound was Pressure Injury. The wound is located on the Right Trochanter. The wound measures 0cm length x 0cm width x 0cm depth; 0cm^2 area and 0cm^3 volume. 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 There was a Skin/Subcutaneous Tissue/Muscle Debridement ([id number]) debridement with total area of 20.25 sq cm performed by Maxwell Caul, MD.  with the following instrument(s): Curette and Forceps to remove Viable and Non-Viable tissue/material including Fibrin/Slough, Muscle, and Subcutaneous after achieving pain control using Other (lidocaine 4%). A time out was conducted at  09:55, prior to the start of the procedure. A Minimum amount of bleeding was controlled with Pressure. The procedure was tolerated well with a pain level of 2 throughout and a pain level of 0 following the procedure. Post Debridement Measurements: 4.5cm length x 4.5cm width x 1.5cm depth; 23.856cm^3 volume. Post debridement Stage noted as Category/Stage IV. Character of Wound/Ulcer Post Debridement requires further debridement. Post procedure Diagnosis Wound #5: Same as Pre-Procedure Wound #6 Pre-procedure diagnosis of Wound #6 is a Pressure Ulcer located on the Medial Sacrum . There was a Skin/Subcutaneous Tissue/Muscle Debridement ([id number]) debridement with total area of 10.26 sq cm performed by Maxwell Caul, MD. with the following instrument(s): Curette and Forceps to remove Viable and Non-Viable tissue/material including Fibrin/Slough, Muscle, and Subcutaneous after achieving pain control using Other (lidocaine 4%). A time out was conducted at 09:55, prior to the start of the procedure. A Minimum amount of bleeding was controlled with Pressure. The procedure was tolerated well with a pain level of 2 throughout and a pain level of 0 following the procedure. Post Debridement Measurements: 2cm length x 6cm width x 0.5cm depth; 4.712cm^3 volume. Post debridement Stage noted as Category/Stage III. Character of Wound/Ulcer Post Debridement requires further debridement. Post procedure Diagnosis Wound #6: Same as Pre-Procedure Capozzoli, Quin C. ([id number]) Plan Wound Cleansing: Wound #4 Left Gluteus: Clean wound with Normal Saline. Cleanse wound with mild soap and water Wound #5 Right Gluteus: Clean wound with Normal Saline. Cleanse wound with mild soap and water Wound #6 Medial Sacrum: Clean wound with Normal Saline. Cleanse wound with mild soap and water Anesthetic: Wound #4 Left Gluteus: Topical Lidocaine 4% cream applied to wound bed prior to debridement Wound #5 Right  Gluteus: Topical Lidocaine 4% cream applied to wound bed prior to debridement Wound #6 Medial Sacrum: Topical Lidocaine 4% cream applied to wound bed prior to debridement Skin Barriers/Peri-Wound Care: Wound #4 Left Gluteus: Skin Prep Wound #5 Right Gluteus: Skin Prep Wound #6 Medial Sacrum: Skin Prep Primary Wound Dressing: Wound #4 Left Gluteus: Aquacel Ag Wound #5 Right Gluteus: Aquacel Ag Wound #6 Medial Sacrum: Aquacel Ag Secondary Dressing: Wound #4 Left Gluteus: Boardered Foam Dressing Wound #5 Right Gluteus: Boardered Foam Dressing Wound #6 Medial Sacrum: Boardered Foam Dressing Dressing Change Frequency: Wound #4 Left Gluteus: Change dressing every other day. - daily if needed Wound #5 Right Gluteus: Change dressing every other day. - daily if needed Wound #6 Medial Sacrum: Change dressing every other day. - daily if needed Follow-up Appointments: Wound #4 Left Gluteus: Return Appointment in 1 week. Wound #5 Right Gluteus: Return Appointment in 1 week. Wound #6 Medial Sacrum: Return Appointment in 1 week. Off-Loading: Wound #4 Left Gluteus: Arauz, Aneudy C. ([id number]) Roho cushion for wheelchair Turn and reposition every 2 hours Wound #5 Right Gluteus: Roho cushion for wheelchair Turn and reposition every 2 hours Wound #6 Medial Sacrum: Roho cushion for wheelchair Turn and reposition every 2 hours Additional Orders / Instructions: Wound #4 Left Gluteus: Increase protein intake. Wound #5 Right Gluteus: Increase protein intake. Wound #6 Medial Sacrum: Increase protein intake. Medications-please add to medication list.: Wound #4 Left Gluteus: Other: - Vitamins A, C and Zinc Wound #5 Right Gluteus: Other: - Vitamins A, C and Zinc Wound #6 Medial Sacrum: Other: - Vitamins A, C and Zinc The  following medication(s) was prescribed: Augmentin oral 875 mg-125 mg tablet 1 tablet oral po bid for an additional 2 weeks for wound infection starting  01/02/2017 sulfamethoxazole-trimethoprim oral 800 mg-160 mg tablet 1 tablet oral bid for an additional 2 weeks for wound infection starting 01/02/2017 #1 none of the 3 wounds we are currently treating looks particularly healthy. Still necrotic surfaces #2 in an ideal worlda trip to the OR with general surgery for debridement might be better although I'm not even sure how I could start to arrange this #3 because of the extensive soft tissue infection in the right ischial tuberosity wound which grew Proteus and MRSA I'm continuing the Augmentin and Septra DS for an additional 2 weeks which should give him 3 weeks of therapy and should be satisfactory unless he has underlying osteomyelitis which would take additional imaging to determine #4 for now continue with the silver alginate based dressings. At some point I would like to change to Silver collagen Electronic Signature(s) Signed: 01/10/2017 3:14:02 PM By: Elliot GurneyWoody, BSN, RN, CWS, Kim RN, BSN Signed: 01/16/2017 8:07:00 AM By: Baltazar Najjarobson, Michael MD Previous Signature: 01/02/2017 5:39:07 PM Version By: Baltazar Najjarobson, Michael MD Entered By: Elliot GurneyWoody, BSN, RN, CWS, Kim on 01/10/2017 15:14:02 Mittag, [name] BienICHARD C. ([id number]) -------------------------------------------------------------------------------- SuperBill Details Patient Name: [name] ShaggyHATCH, Hezzie C. Date of Service: 01/02/2017 Medical Record Number: [id number] Patient Account Number: [id number] Date of Birth/Sex: 09-Nov-1950 [age] y.o. Male) Treating RN: Huel CoventryWoody, Kim Primary Care Provider: Elizabeth SauerJones, Deanna Other Clinician: Referring Provider: Elizabeth SauerJones, Deanna Treating Provider/Extender: Maxwell CaulOBSON, MICHAEL G Weeks in Treatment: 3449 Diagnosis Coding ICD-10 Codes Code Description 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, stage 3 S14.103S Unspecified injury at C3 level of cervical spinal cord, sequela S51.011A Laceration without foreign body of right elbow, initial encounter L89.322 Pressure ulcer of left buttock, stage 2 L89.310  Pressure ulcer of right buttock, unstageable L03.317 Cellulitis of buttock Facility Procedures CPT4 Code: [id number] Description: 11043 - DEB MUSC/FASCIA 20 SQ CM/< ICD-10 Diagnosis Description L89.310 Pressure ulcer of right buttock, unstageable Modifier: Quantity: 1 CPT4 Code: [id number] Description: 11046 - DEB MUSC/FASCIA EA ADDL 20 CM ICD-10 Diagnosis Description L89.310 Pressure ulcer of right buttock, unstageable L89.322 Pressure ulcer of left buttock, stage 2 Modifier: Quantity: 1 Physician Procedures CPT4 Code: [id number] Description: 11043 - WC PHYS DEBR MUSCLE/FASCIA 20 SQ CM ICD-10 Diagnosis Description L89.310 Pressure ulcer of right buttock, unstageable Modifier: Quantity: 1 CPT4 Code: [id number] Description: 11046 - WC PHYS DEB MUSC/FASC EA ADDL 20 CM ICD-10 Diagnosis Description L89.310 Pressure ulcer of right buttock, unstageable L89.322 Pressure ulcer of left buttock, stage 2 Modifier: Quantity: 1 Electronic Signature(s) Signed: 01/02/2017 5:39:07 PM By: Baltazar Najjarobson, Michael MD Entered By: Baltazar Najjarobson, Michael on 01/02/2017 10:51:37

## 2017-01-04 NOTE — Progress Notes (Signed)
EVER, GUSTAFSON ([id number]) Visit Report for 01/02/2017 Arrival Information Details Patient Name: NEERAJ, HOUSAND. Date of Service: 01/02/2017 9:00 AM Medical Record Number: [id number] Patient Account Number: [id number] Date of Birth/Sex: 11-24-1950 (66 y.o. Male) Treating RN: Cornell Barman Primary Care Wretha Laris: Otilio Miu Other Clinician: Referring Tonnette Zwiebel: Otilio Miu Treating Aneyah Lortz/Extender: Tito Dine in Treatment: 59 Visit Information History Since Last Visit Added or deleted any medications: No Patient Arrived: Wheel Chair Any new allergies or adverse reactions: No Arrival Time: 09:05 Had a fall or experienced change in No activities of daily living that may affect Accompanied By: wife and risk of falls: son Signs or symptoms of abuse/neglect since last visito No Transfer Assistance: [employer] Hospitalized since last visit: No Patient Identification Verified: Yes Has Dressing in Place as Prescribed: Yes Secondary Verification Process Completed: Yes Pain Present Now: Yes Patient Requires Transmission-Based No Precautions: Patient Has Alerts: Yes Electronic Signature(s) Signed: 01/02/2017 4:19:52 PM By: Gretta Cool, BSN, RN, CWS, Kim RN, BSN Entered By: Gretta Cool, BSN, RN, CWS, Kim on 01/02/2017 09:06:10 [place], Leslye Peer ([id number]) -------------------------------------------------------------------------------- Encounter Discharge Information Details Patient Name: Freda, Levonte C. Date of Service: 01/02/2017 9:00 AM Medical Record Number: [id number] Patient Account Number: [id number] Date of Birth/Sex: 1951/02/01 (66 y.o. Male) Treating RN: Cornell Barman Primary Care Brandonn Capelli: Otilio Miu Other Clinician: Referring Josilyn Shippee: Otilio Miu Treating Marlise Fahr/Extender: Tito Dine in Treatment: 67 Encounter Discharge Information Items Schedule Follow-up Appointment: No Medication Reconciliation completed and No provided to Patient/Care  Symon Norwood: Provided on Clinical Summary of Care: 01/02/2017 Form Type Recipient Paper Patient [facility] Electronic Signature(s) Signed: 01/03/2017 10:05:11 AM By: Ruthine Dose Entered By: Ruthine Dose on 01/02/2017 10:20:29 [place], Leslye Peer ([id number]) -------------------------------------------------------------------------------- Lower Extremity Assessment Details Patient Name: Piccini, Avinash C. Date of Service: 01/02/2017 9:00 AM Medical Record Number: [id number] Patient Account Number: [id number] Date of Birth/Sex: 03/20/1950 (66 y.o. Male) Treating RN: Cornell Barman Primary Care Priseis Cratty: Otilio Miu Other Clinician: Referring Nasir Bright: Otilio Miu Treating Rece Zechman/Extender: Tito Dine in Treatment: 30 Electronic Signature(s) Signed: 01/02/2017 4:19:52 PM By: Gretta Cool, BSN, RN, CWS, Kim RN, BSN Entered By: Gretta Cool, BSN, RN, CWS, Kim on 01/02/2017 09:56:08 [place], Leslye Peer ([id number]) -------------------------------------------------------------------------------- Multi Wound Chart Details Patient Name: LAURICE, KIMMONS C. Date of Service: 01/02/2017 9:00 AM Medical Record Number: [id number] Patient Account Number: [id number] Date of Birth/Sex: April 08, 1950 (66 y.o. Male) Treating RN: Cornell Barman Primary Care Luwanna Brossman: Otilio Miu Other Clinician: Referring Darvin Dials: Otilio Miu Treating Carolyna Yerian/Extender: Ricard Dillon Weeks in Treatment: 35 Vital Signs Height(in): 70 Pulse(bpm): 98 Weight(lbs): 187 Blood Pressure(mmHg): 123/81 Body Mass Index(BMI): 27 Temperature(F): 97.8 Respiratory Rate 16 (breaths/min): Photos: [4:No Photos] [5:No Photos] [6:No Photos] Wound Location: [4:Left Gluteus] [5:Right Gluteus] [6:Medial Sacrum] Wounding Event: [4:Pressure Injury] [5:Pressure Injury] [6:Pressure Injury] Primary Etiology: [4:Pressure Ulcer] [5:Pressure Ulcer] [6:Pressure Ulcer] Comorbid History: [4:N/A] [5:Coronary Artery Disease, Type N/A II Diabetes, Quadriplegia,  Seizure Disorder] Date Acquired: [4:07/09/2016] [5:09/10/2016] [6:10/16/2016] Weeks of Treatment: [4:20] [5:15] [6:10] Wound Status: [4:Open] [5:Open] [6:Open] Wound Recurrence: [4:No] [5:No] [6:No] Clustered Wound: [4:No] [5:No] [6:No] Measurements L x W x D [4:2x2.2x0.1] [5:4.5x4.5x1] [6:1.8x5.7x0.1] (cm) Area (cm) : [4:3.456] [5:15.904] [6:8.058] Volume (cm) : [4:0.346] [5:15.904] [6:0.806] % Reduction in Area: [4:-2101.30%] [5:-181.20%] [6:62.00%] % Reduction in Volume: [4:-2062.50%] [5:-2714.90%] [6:62.00%] Starting Position 1 [5:6] (o'clock): Ending Position 1 [5:12] (o'clock): Maximum Distance 1 (cm): [5:1.5] Undermining: [4:N/A] [5:Yes] [6:N/A] Classification: [4:Unstageable/Unclassified] [5:Category/Stage IV] [6:Category/Stage II] Exudate Amount: [4:N/A] [5:Large] [6:N/A] Exudate Type: [4:N/A] [5:Serous] [6:N/A] Exudate Color: [4:N/A] [5:amber] [6:N/A] Foul Odor After  Cleansing: [4:N/A] [5:Yes] [6:N/A] Odor Anticipated Due to [4:N/A] [5:No] [6:N/A] Product Use: Wound Margin: [4:N/A] [5:Flat and Intact] [6:N/A] Granulation Amount: [4:N/A] [5:Medium (34-66%)] [6:N/A] Granulation Quality: [4:N/A] [5:Red] [6:N/A] Necrotic Amount: [4:N/A] [5:Medium (34-66%)] [6:N/A] Epithelialization: [4:N/A] [5:None] [6:N/A] Debridement: [4:N/A] [5:Debridement ([id number])] [6:Debridement ([id number])] Pre-procedure N/A 09:55 09:55 Verification/Time Out Taken: Pain Control: N/A Other Other Tissue Debrided: N/A Fibrin/Slough, Muscle, Fibrin/Slough, Subcutaneous Subcutaneous Level: N/A Skin/Subcutaneous Skin/Subcutaneous Tissue/Muscle Tissue/Muscle Debridement Area (sq cm): N/A 20.25 10.26 Instrument: N/A Curette, Forceps Curette, Forceps Bleeding: N/A Minimum Minimum Hemostasis Achieved: N/A Pressure Pressure Procedural Pain: N/A 2 2 Post Procedural Pain: N/A 0 0 Debridement Treatment N/A Procedure was tolerated well Procedure was tolerated well Response: Post Debridement N/A  4.5x4.5x1.5 2x6x0.5 Measurements L x W x D (cm) Post Debridement Volume: N/A 23.856 4.712 (cm) Post Debridement Stage: N/A Category/Stage IV Category/Stage II Periwound Skin Texture: No Abnormalities Noted Induration: Yes No Abnormalities Noted Periwound Skin Moisture: No Abnormalities Noted No Abnormalities Noted No Abnormalities Noted Periwound Skin Color: No Abnormalities Noted Erythema: Yes No Abnormalities Noted Erythema Location: N/A Circumferential N/A Temperature: N/A No Abnormality N/A Tenderness on Palpation: No Yes No Wound Preparation: N/A Ulcer Cleansing: N/A Rinsed/Irrigated with Saline Topical Anesthetic Applied: Other: lidocaine 4% Procedures Performed: N/A Debridement Debridement Wound Number: [id number] N/A N/A Photos: No Photos N/A N/A Wound Location: Right Trochanter N/A N/A Wounding Event: Pressure Injury N/A N/A Primary Etiology: Pressure Ulcer N/A N/A Comorbid History: N/A N/A N/A Date Acquired: 11/06/2016 N/A N/A Weeks of Treatment: 6 N/A N/A Wound Status: Healed - Epithelialized N/A N/A Wound Recurrence: Yes N/A N/A Clustered Wound: Yes N/A N/A Measurements L x W x D 0x0x0 N/A N/A (cm) Area (cm) : 0 N/A N/A Volume (cm) : 0 N/A N/A % Reduction in Area: 100.00% N/A N/A % Reduction in Volume: 100.00% N/A N/A Undermining: N/A N/A N/A Classification: Unstageable/Unclassified N/A N/A Exudate Amount: N/A N/A N/A Exudate Type: N/A N/A N/A Exudate Color: N/A N/A N/A Foul Odor After Cleansing: N/A N/A N/A Jon, Luisalberto C. ([id number]) Odor Anticipated Due to N/A N/A N/A Product Use: Wound Margin: N/A N/A N/A Granulation Amount: N/A N/A N/A Granulation Quality: N/A N/A N/A Necrotic Amount: N/A N/A N/A Exposed Structures: N/A N/A N/A Epithelialization: N/A N/A N/A Debridement: N/A N/A N/A Pain Control: N/A N/A N/A Tissue Debrided: N/A N/A N/A Level: N/A N/A N/A Debridement Area (sq cm): N/A N/A N/A Instrument: N/A N/A N/A Bleeding: N/A N/A  N/A Hemostasis Achieved: N/A N/A N/A Procedural Pain: N/A N/A N/A Post Procedural Pain: N/A N/A N/A Debridement Treatment N/A N/A N/A Response: Post Debridement N/A N/A N/A Measurements L x W x D (cm) Post Debridement Volume: N/A N/A N/A (cm) Post Debridement Stage: N/A N/A N/A Periwound Skin Texture: No Abnormalities Noted N/A N/A Periwound Skin Moisture: No Abnormalities Noted N/A N/A Periwound Skin Color: No Abnormalities Noted N/A N/A Erythema Location: N/A N/A N/A Temperature: N/A N/A N/A Tenderness on Palpation: No N/A N/A Wound Preparation: N/A N/A N/A Procedures Performed: N/A N/A N/A Treatment Notes Wound #4 (Left Gluteus) 1. Cleansed with: Clean wound with Normal Saline 2. Anesthetic Topical Lidocaine 4% cream to wound bed prior to debridement 4. Dressing Applied: Other dressing (specify in notes) 5. Secondary Dressing Applied Bordered Foam Dressing Wound #5 (Right Gluteus) 1. Cleansed with: Clean wound with Normal Saline 2. Anesthetic Topical Lidocaine 4% cream to wound bed prior to debridement 4. Dressing Applied: Other dressing (specify in notes) 5. Secondary Dressing Applied Bordered Foam Dressing RAKEEN, GAILLARD ([id number]) Wound #  6 (Medial Sacrum) 1. Cleansed with: Clean wound with Normal Saline 2. Anesthetic Topical Lidocaine 4% cream to wound bed prior to debridement 4. Dressing Applied: Other dressing (specify in notes) 5. Secondary Dressing Applied Bordered Foam Dressing Electronic Signature(s) Signed: 01/02/2017 5:39:07 PM By: Linton Ham MD Entered By: Linton Ham on 01/02/2017 10:40:50 [place], Leslye Peer ([id number]) -------------------------------------------------------------------------------- Multi-Disciplinary Care Plan Details Patient Name: GOTTLIEB, ZUERCHER. Date of Service: 01/02/2017 9:00 AM Medical Record Number: [id number] Patient Account Number: [id number] Date of Birth/Sex: Jul 03, 1950 (66 y.o. Male) Treating RN:  Cornell Barman Primary Care Debera Sterba: Otilio Miu Other Clinician: Referring Juleon Narang: Otilio Miu Treating Bannon Giammarco/Extender: Tito Dine in Treatment: 37 Active Inactive ` Nutrition Nursing Diagnoses: Imbalanced nutrition Impaired glucose control: actual or potential Goals: Patient/caregiver agrees to and verbalizes understanding of need to use nutritional supplements and/or vitamins as prescribed Date Initiated: 01/24/2016 Target Resolution Date: 03/27/2016 Goal Status: Active Patient/caregiver will maintain therapeutic glucose control Date Initiated: 01/24/2016 Target Resolution Date: 03/27/2016 Goal Status: Active Interventions: Assess patient nutrition upon admission and as needed per policy Provide education on elevated blood sugars and impact on wound healing Notes: ` Orientation to the Wound Care Program Nursing Diagnoses: Knowledge deficit related to the wound healing center program Goals: Patient/caregiver will verbalize understanding of the [place] Date Initiated: 01/24/2016 Target Resolution Date: 03/27/2016 Goal Status: Active Interventions: Provide education on orientation to the wound center Notes: ` Pain, Acute or Chronic Nursing Diagnoses: Pain, acute or chronic: actual or potential Potential alteration in comfort, pain Coglianese, DRACO MALCZEWSKI ([id number]) Goals: Patient will verbalize adequate pain control and receive pain control interventions during procedures as needed Date Initiated: 01/24/2016 Target Resolution Date: 03/27/2016 Goal Status: Active Patient/caregiver will verbalize adequate pain control between visits Date Initiated: 01/24/2016 Target Resolution Date: 03/27/2016 Goal Status: Active Patient/caregiver will verbalize comfort level met Date Initiated: 01/24/2016 Target Resolution Date: 03/27/2016 Goal Status: Active Interventions: Assess comfort goal upon admission Complete pain assessment as per visit  requirements Notes: ` Pressure Nursing Diagnoses: Knowledge deficit related to management of pressures ulcers Potential for impaired tissue integrity related to pressure, friction, moisture, and shear Goals: Patient will remain free from development of additional pressure ulcers Date Initiated: 01/24/2016 Target Resolution Date: 03/27/2016 Goal Status: Active Interventions: Assess: immobility, friction, shearing, incontinence upon admission and as needed Assess offloading mechanisms upon admission and as needed Assess potential for pressure ulcer upon admission and as needed Provide education on pressure ulcers Notes: ` Wound/Skin Impairment Nursing Diagnoses: Impaired tissue integrity Goals: Ulcer/skin breakdown will have a volume reduction of 30% by week 4 Date Initiated: 01/24/2016 Target Resolution Date: 03/27/2016 Goal Status: Active Ulcer/skin breakdown will have a volume reduction of 50% by week 8 Date Initiated: 01/24/2016 Target Resolution Date: 03/27/2016 Goal Status: Active Ulcer/skin breakdown will have a volume reduction of 80% by week 12 Date Initiated: 01/24/2016 Target Resolution Date: 03/27/2016 KIMO, BANCROFT ([id number]) Goal Status: Active Interventions: Assess patient/caregiver ability to perform ulcer/skin care regimen upon admission and as needed Assess ulceration(s) every visit Notes: Electronic Signature(s) Signed: 01/02/2017 4:19:52 PM By: Gretta Cool, BSN, RN, CWS, Kim RN, BSN Entered By: Gretta Cool, BSN, RN, CWS, Kim on 01/02/2017 09:56:16 [place], Leslye Peer ([id number]) -------------------------------------------------------------------------------- Pain Assessment Details Patient Name: KEELEN, QUEVEDO C. Date of Service: 01/02/2017 9:00 AM Medical Record Number: [id number] Patient Account Number: [id number] Date of Birth/Sex: 02/15/1951 (66 y.o. Male) Treating RN: Cornell Barman Primary Care Linzee Depaul: Otilio Miu Other Clinician: Referring Tirza Senteno: Otilio Miu Treating Agness Sibrian/Extender: Tito Dine  in Treatment: 49 Active Problems Location of Pain Severity and Description of Pain Patient Has Paino Yes Site Locations Pain Location: Generalized Pain, Pain in Ulcers With Dressing Change: Yes Pain Management and Medication Current Pain Management: Electronic Signature(s) Signed: 01/02/2017 4:19:52 PM By: Gretta Cool, BSN, RN, CWS, Kim RN, BSN Entered By: Gretta Cool, BSN, RN, CWS, Kim on 01/02/2017 09:06:22 [place], Leslye Peer ([id number]) -------------------------------------------------------------------------------- Wound Assessment Details Patient Name: Budden, Raynell C. Date of Service: 01/02/2017 9:00 AM Medical Record Number: [id number] Patient Account Number: [id number] Date of Birth/Sex: 04/08/1950 (66 y.o. Male) Treating RN: Cornell Barman Primary Care Stephens Shreve: Otilio Miu Other Clinician: Referring Tajuan Dufault: Otilio Miu Treating Niam Nepomuceno/Extender: Ricard Dillon Weeks in Treatment: 49 Wound Status Wound Number: 4 Primary Etiology: Pressure Ulcer Wound Location: Left Gluteus Wound Status: Open Wounding Event: Pressure Injury Date Acquired: 07/09/2016 Weeks Of Treatment: 20 Clustered Wound: No Photos Photo Uploaded By: Gretta Cool, BSN, RN, CWS, Kim on 01/02/2017 16:08:55 Wound Measurements Length: (cm) 2 Width: (cm) 2.2 Depth: (cm) 0.1 Area: (cm) 3.456 Volume: (cm) 0.346 % Reduction in Area: -2101.3% % Reduction in Volume: -2062.5% Wound Description Classification: Unstageable/Unclassified Periwound Skin Texture Texture Color No Abnormalities Noted: No No Abnormalities Noted: No Moisture No Abnormalities Noted: No Treatment Notes Wound #4 (Left Gluteus) 1. Cleansed with: Clean wound with Normal Saline 2. Anesthetic Topical Lidocaine 4% cream to wound bed prior to debridement 4. Dressing Applied: Other dressing (specify in notes) 5. Secondary Dressing Applied Bordered Foam Dressing DELANEY, SCHNICK  ([id number]) Electronic Signature(s) Signed: 01/02/2017 4:19:52 PM By: Gretta Cool, BSN, RN, CWS, Kim RN, BSN Entered By: Gretta Cool, BSN, RN, CWS, Kim on 01/02/2017 09:20:12 [place], Leslye Peer ([id number]) -------------------------------------------------------------------------------- Wound Assessment Details Patient Name: REYAAN, THOMA C. Date of Service: 01/02/2017 9:00 AM Medical Record Number: [id number] Patient Account Number: [id number] Date of Birth/Sex: 1950/10/07 (66 y.o. Male) Treating RN: Cornell Barman Primary Care Adryan Shin: Otilio Miu Other Clinician: Referring Betul Brisky: Otilio Miu Treating Alexsa Flaum/Extender: Ricard Dillon Weeks in Treatment: 65 Wound Status Wound Number: 5 Primary Pressure Ulcer Etiology: Wound Location: Right Gluteus Wound Open Wounding Event: Pressure Injury Status: Date Acquired: 09/10/2016 Comorbid Coronary Artery Disease, Type II Diabetes, Weeks Of Treatment: 15 History: Quadriplegia, Seizure Disorder Clustered Wound: No Photos Photo Uploaded By: Gretta Cool, BSN, RN, CWS, Kim on 01/02/2017 16:09:22 Wound Measurements Length: (cm) 4.5 % Reducti Width: (cm) 4.5 % Reducti Depth: (cm) 1 Epithelia Area: (cm) 15.904 Undermin Volume: (cm) 15.904 Start Ending Maximu on in Area: -181.2% on in Volume: -2714.9% lization: None ing: Yes ing Position (o'clock): 6 Position (o'clock): 12 m Distance: (cm) 1.5 Wound Description Classification: Category/Stage IV Foul Odor Wound Margin: Flat and Intact Due to Pr Exudate Amount: Large Slough/Fi Exudate Type: Serous Exudate Color: amber After Cleansing: Yes oduct Use: No brino Yes Wound Bed Granulation Amount: Medium (34-66%) Exposed Structure Granulation Quality: Red Fascia Exposed: No Necrotic Amount: Medium (34-66%) Fat Layer (Subcutaneous Tissue) Exposed: Yes Necrotic Quality: Adherent Slough Tendon Exposed: No Muscle Exposed: Yes Necrosis of Muscle: No Joint Exposed: No Bone Exposed: No Goral,  Lewellyn C. ([id number]) Periwound Skin Texture Texture Color No Abnormalities Noted: No No Abnormalities Noted: No Induration: Yes Erythema: Yes Erythema Location: Circumferential Moisture No Abnormalities Noted: No Temperature / Pain Temperature: No Abnormality Tenderness on Palpation: Yes Wound Preparation Ulcer Cleansing: Rinsed/Irrigated with Saline Topical Anesthetic Applied: Other: lidocaine 4%, Treatment Notes Wound #5 (Right Gluteus) 1. Cleansed with: Clean wound with Normal Saline 2. Anesthetic Topical Lidocaine 4% cream to wound bed prior to debridement 4. Dressing Applied: Other  dressing (specify in notes) 5. Secondary Dressing Applied Bordered Foam Dressing Electronic Signature(s) Signed: 01/02/2017 4:19:52 PM By: Gretta Cool, BSN, RN, CWS, Kim RN, BSN Entered By: Gretta Cool, BSN, RN, CWS, Kim on 01/02/2017 09:21:36 [place], Leslye Peer ([id number]) -------------------------------------------------------------------------------- Wound Assessment Details Patient Name: BREVON, DEWALD C. Date of Service: 01/02/2017 9:00 AM Medical Record Number: [id number] Patient Account Number: [id number] Date of Birth/Sex: August 11, 1950 (66 y.o. Male) Treating RN: Cornell Barman Primary Care Dannie Woolen: Otilio Miu Other Clinician: Referring Brondon Wann: Otilio Miu Treating Adrionna Delcid/Extender: Ricard Dillon Weeks in Treatment: 58 Wound Status Wound Number: 6 Primary Etiology: Pressure Ulcer Wound Location: Medial Sacrum Wound Status: Open Wounding Event: Pressure Injury Date Acquired: 10/16/2016 Weeks Of Treatment: 10 Clustered Wound: No Photos Photo Uploaded By: Gretta Cool, BSN, RN, CWS, Kim on 01/02/2017 16:16:00 Wound Measurements Length: (cm) 1.8 Width: (cm) 5.7 Depth: (cm) 0.1 Area: (cm) 8.058 Volume: (cm) 0.806 % Reduction in Area: 62% % Reduction in Volume: 62% Wound Description Classification: Category/Stage II Periwound Skin Texture Texture Color No Abnormalities Noted:  No No Abnormalities Noted: No Moisture No Abnormalities Noted: No Treatment Notes Wound #6 (Medial Sacrum) 1. Cleansed with: Clean wound with Normal Saline 2. Anesthetic Topical Lidocaine 4% cream to wound bed prior to debridement 4. Dressing Applied: Other dressing (specify in notes) 5. Secondary Dressing Applied Bordered Foam Dressing EZEQUIEL, MACAULEY ([id number]) Electronic Signature(s) Signed: 01/02/2017 4:19:52 PM By: Gretta Cool, BSN, RN, CWS, Kim RN, BSN Entered By: Gretta Cool, BSN, RN, CWS, Kim on 01/02/2017 09:22:09 [place], Leslye Peer ([id number]) -------------------------------------------------------------------------------- Wound Assessment Details Patient Name: ORVILL, COULTHARD C. Date of Service: 01/02/2017 9:00 AM Medical Record Number: [id number] Patient Account Number: [id number] Date of Birth/Sex: [birth year] (66 y.o. Male) Treating RN: Cornell Barman Primary Care Zarah Carbon: Otilio Miu Other Clinician: Referring Locklan Canoy: Otilio Miu Treating Lenell Mcconnell/Extender: Ricard Dillon Weeks in Treatment: 27 Wound Status Wound Number: 7R Primary Etiology: Pressure Ulcer Wound Location: Right Trochanter Wound Status: Healed - Epithelialized Wounding Event: Pressure Injury Date Acquired: 11/06/2016 Weeks Of Treatment: 6 Clustered Wound: Yes Wound Measurements Length: (cm) 0 Width: (cm) 0 Depth: (cm) 0 Area: (cm) 0 Volume: (cm) 0 % Reduction in Area: 100% % Reduction in Volume: 100% Wound Description Classification: Unstageable/Unclassified Periwound Skin Texture Texture Color No Abnormalities Noted: No No Abnormalities Noted: No Moisture No Abnormalities Noted: No Electronic Signature(s) Signed: 01/02/2017 4:19:52 PM By: Gretta Cool, BSN, RN, CWS, Kim RN, BSN Entered By: Gretta Cool, BSN, RN, CWS, Kim on 01/02/2017 09:22:44 [place], Leslye Peer ([id number]) -------------------------------------------------------------------------------- Vitals Details Patient Name: Tana Coast  C. Date of Service: 01/02/2017 9:00 AM Medical Record Number: [id number] Patient Account Number: [id number] Date of Birth/Sex: [birth year] (66 y.o. Male) Treating RN: Cornell Barman Primary Care Eveline Sauve: Otilio Miu Other Clinician: Referring Adelle Zachar: Otilio Miu Treating Xena Propst/Extender: Ricard Dillon Weeks in Treatment: 54 Vital Signs Time Taken: 09:06 Temperature (F): 97.8 Height (in): 70 Pulse (bpm): 98 Weight (lbs): 187 Respiratory Rate (breaths/min): 16 Body Mass Index (BMI): 26.8 Blood Pressure (mmHg): 123/81 Reference Range: 80 - 120 mg / dl Electronic Signature(s) Signed: 01/02/2017 4:19:52 PM By: Gretta Cool, BSN, RN, CWS, Kim RN, BSN Entered By: Gretta Cool, BSN, RN, CWS, Kim on 01/02/2017 09:06:58

## 2017-01-09 ENCOUNTER — Encounter: Payer: Medicare Other | Admitting: Internal Medicine

## 2017-01-09 DIAGNOSIS — Z8673 Personal history of transient ischemic attack (TIA), and cerebral infarction without residual deficits: Secondary | ICD-10-CM | POA: Diagnosis not present

## 2017-01-09 DIAGNOSIS — R569 Unspecified convulsions: Secondary | ICD-10-CM | POA: Diagnosis not present

## 2017-01-09 DIAGNOSIS — K219 Gastro-esophageal reflux disease without esophagitis: Secondary | ICD-10-CM | POA: Diagnosis not present

## 2017-01-09 DIAGNOSIS — L89314 Pressure ulcer of right buttock, stage 4: Secondary | ICD-10-CM | POA: Diagnosis not present

## 2017-01-09 DIAGNOSIS — E119 Type 2 diabetes mellitus without complications: Secondary | ICD-10-CM | POA: Diagnosis not present

## 2017-01-09 DIAGNOSIS — Z7984 Long term (current) use of oral hypoglycemic drugs: Secondary | ICD-10-CM | POA: Diagnosis not present

## 2017-01-09 DIAGNOSIS — G8252 Quadriplegia, C1-C4 incomplete: Secondary | ICD-10-CM | POA: Diagnosis not present

## 2017-01-09 DIAGNOSIS — L03317 Cellulitis of buttock: Secondary | ICD-10-CM | POA: Diagnosis not present

## 2017-01-09 DIAGNOSIS — L89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, stage 3: Secondary | ICD-10-CM | POA: Diagnosis not present

## 2017-01-09 DIAGNOSIS — L89310 Pressure ulcer of right buttock, unstageable: Secondary | ICD-10-CM | POA: Diagnosis not present

## 2017-01-09 DIAGNOSIS — L89152 Pressure ulcer of sacral region, stage 2: Secondary | ICD-10-CM | POA: Diagnosis not present

## 2017-01-09 DIAGNOSIS — I959 Hypotension, unspecified: Secondary | ICD-10-CM | POA: Diagnosis not present

## 2017-01-09 DIAGNOSIS — S14103S Unspecified injury at C3 level of cervical spinal cord, sequela: Secondary | ICD-10-CM | POA: Diagnosis not present

## 2017-01-09 DIAGNOSIS — S51011A Laceration without foreign body of right elbow, initial encounter: Secondary | ICD-10-CM | POA: Diagnosis not present

## 2017-01-09 DIAGNOSIS — L89322 Pressure ulcer of left buttock, stage 2: Secondary | ICD-10-CM | POA: Diagnosis not present

## 2017-01-09 DIAGNOSIS — L89320 Pressure ulcer of left buttock, unstageable: Secondary | ICD-10-CM | POA: Diagnosis not present

## 2017-01-10 NOTE — Progress Notes (Signed)
BEREN, YNIGUEZ ([id number]) Visit Report for 01/09/2017 Arrival Information Details Patient Name: [name], [name]. Date of Service: 01/09/2017 3:30 PM Medical Record Number: [id number] Patient Account Number: [id number] Date of Birth/Sex: 21-Apr-1950 (66 y.o. Male) Treating RN: Cornell Barman Primary Care Azusena Erlandson: Otilio Miu Other Clinician: Referring Rani Sisney: Otilio Miu Treating Annaka Cleaver/Extender: Tito Dine in Treatment: 76 Visit Information History Since Last Visit Added or deleted any medications: No Patient Arrived: Wheel Chair Any new allergies or adverse reactions: No Arrival Time: 15:27 Had a fall or experienced change in No Accompanied By: wife, activities of daily living that may affect Rhonda risk of falls: Transfer Assistance: [employer] Signs or symptoms of abuse/neglect since last visito No Patient Identification Verified: Yes Has Dressing in Place as Prescribed: Yes Secondary Verification Process Completed: Yes Pain Present Now: No Patient Requires Transmission-Based No Precautions: Patient Has Alerts: Yes Electronic Signature(s) Signed: 01/09/2017 5:14:33 PM By: Gretta Cool, BSN, RN, CWS, Kim RN, BSN Entered By: Gretta Cool, BSN, RN, CWS, Kim on 01/09/2017 15:27:41 [name] ([id number]) -------------------------------------------------------------------------------- Encounter Discharge Information Details Patient Name: [name], [name] Date of Service: 01/09/2017 3:30 PM Medical Record Number: [id number] Patient Account Number: [id number] Date of Birth/Sex: Apr 02, 1950 (66 y.o. Male) Treating RN: Cornell Barman Primary Care Omer Monter: Otilio Miu Other Clinician: Referring Romari Gasparro: Otilio Miu Treating Dat Derksen/Extender: Tito Dine in Treatment: 38 Encounter Discharge Information Items Schedule Follow-up Appointment: No Medication Reconciliation completed and No provided to Patient/Care Josejuan Hoaglin: Patient Clinical Summary of  Care: Declined Electronic Signature(s) Signed: 01/09/2017 4:27:25 PM By: Ruthine Dose Entered By: Ruthine Dose on 01/09/2017 16:27:08 Morini, Leslye [name] ([id number]) -------------------------------------------------------------------------------- Lower Extremity Assessment Details Patient Name: [name], [name] Date of Service: 01/09/2017 3:30 PM Medical Record Number: [id number] Patient Account Number: [id number] Date of Birth/Sex: Jun 30, 1950 (66 y.o. Male) Treating RN: Cornell Barman Primary Care Kwadwo Taras: Otilio Miu Other Clinician: Referring Martie Fulgham: Otilio Miu Treating Romy Ipock/Extender: Tito Dine in Treatment: 50 Electronic Signature(s) Signed: 01/09/2017 5:14:33 PM By: Gretta Cool, BSN, RN, CWS, Kim RN, BSN Entered By: Gretta Cool, BSN, RN, CWS, Kim on 01/09/2017 15:44:31 Yakel, Leslye [name] ([id number]) -------------------------------------------------------------------------------- Multi Wound Chart Details Patient Name: [name], [name] Date of Service: 01/09/2017 3:30 PM Medical Record Number: [id number] Patient Account Number: [id number] Date of Birth/Sex: [birth year] (66 y.o. Male) Treating RN: Cornell Barman Primary Care Hesper Venturella: Otilio Miu Other Clinician: Referring Cheron Coryell: Otilio Miu Treating Jadae Steinke/Extender: Ricard Dillon Weeks in Treatment: 50 Vital Signs Height(in): 70 Pulse(bpm): 94 Weight(lbs): 187 Blood Pressure(mmHg): 128/61 Body Mass Index(BMI): 27 Temperature(F): 97.6 Respiratory Rate 16 (breaths/min): Photos: Wound Location: Left Gluteus Right Gluteus Sacrum - Medial Wounding Event: Pressure Injury Pressure Injury Pressure Injury Primary Etiology: Pressure Ulcer Pressure Ulcer Pressure Ulcer Comorbid History: Coronary Artery Disease, Type Coronary Artery Disease, Type Coronary Artery Disease, Type II Diabetes, Quadriplegia, II Diabetes, Quadriplegia, II Diabetes, Quadriplegia, Seizure Disorder Seizure Disorder Seizure  Disorder Date Acquired: 07/09/2016 09/10/2016 10/16/2016 Weeks of Treatment: '21 16 11 '$ Wound Status: Open Open Open Measurements L x W x D 2x3x0.2 5.4x5.5x0.7 2x5.2x0.8 (cm) Area (cm) : 4.712 23.326 8.168 Volume (cm) : 0.942 16.328 6.535 % Reduction in Area: -2901.30% -312.50% 61.50% % Reduction in Volume: -5787.50% -2789.90% -208.10% Starting Position 1 6 (o'clock): Ending Position 1 11 (o'clock): Maximum Distance 1 (cm): 1.8 Undermining: No Yes No Classification: Unstageable/Unclassified Category/Stage IV Category/Stage II Exudate Amount: Medium Large Large Exudate Type: Serosanguineous Serous Serosanguineous Exudate Color: red, brown amber red, brown Foul Odor After Cleansing: No Yes No Odor Anticipated Due to N/A No  N/A Product Use: Wound Margin: Flat and Intact Flat and Intact Flat and Intact Granulation Amount: None Present (0%) Medium (34-66%) None Present (0%) Granulation Quality: N/A Red N/A [name], [name] ([id number]) Necrotic Amount: Large (67-100%) Medium (34-66%) Large (67-100%) Necrotic Tissue: Eschar, Adherent Slough Eschar, Adherent [place] Exposed Structures: Fat Layer (Subcutaneous Fat Layer (Subcutaneous Fat Layer (Subcutaneous Tissue) Exposed: Yes Tissue) Exposed: Yes Tissue) Exposed: Yes Fascia: No Muscle: Yes Fascia: No Tendon: No Fascia: No Tendon: No Muscle: No Tendon: No Muscle: No Joint: No Joint: No Joint: No Bone: No Bone: No Bone: No Epithelialization: None None None Debridement: Debridement ([id number]) Debridement ([id number]) Debridement ([id number]) Pre-procedure 16:00 16:00 16:00 Verification/Time Out Taken: Pain Control: Other Other Other Tissue Debrided: Fibrin/Slough, Subcutaneous Fibrin/Slough, Skin, Fibrin/Slough, Subcutaneous Subcutaneous Level: Skin/Subcutaneous Tissue Skin/Subcutaneous Tissue Skin/Subcutaneous Tissue Debridement Area (sq cm): 6 16.2 10.4 Instrument: Blade, Curette Blade,  Curette, Forceps Curette Bleeding: Large Large Minimum Hemostasis Achieved: Silver Nitrate Silver Nitrate Pressure Procedural Pain: 0 0 0 Post Procedural Pain: 0 0 0 Debridement Treatment Procedure was tolerated well Procedure was tolerated well Procedure was tolerated well Response: Post Debridement 2x3x0.5 5.4x5.9x0.8 2x5.2x1.2 Measurements L x W x D (cm) Post Debridement Volume: 2.356 20.018 9.802 (cm) Post Debridement Stage: Unstageable/Unclassified Category/Stage IV Category/Stage II Periwound Skin Texture: No Abnormalities Noted Induration: Yes No Abnormalities Noted Periwound Skin Moisture: No Abnormalities Noted No Abnormalities Noted No Abnormalities Noted Periwound Skin Color: Ecchymosis: Yes Erythema: Yes Ecchymosis: Yes Erythema Location: N/A Circumferential N/A Temperature: N/A No Abnormality N/A Tenderness on Palpation: No Yes No Wound Preparation: Ulcer Cleansing: Ulcer Cleansing: Topical Anesthetic Applied: Rinsed/Irrigated with Saline Rinsed/Irrigated with Saline Other: lidocaine 4% Topical Anesthetic Applied: Topical Anesthetic Applied: Other: lidocaine 4% Other: lidocaine 4% Procedures Performed: Debridement Debridement Debridement Treatment Notes Electronic Signature(s) Signed: 01/09/2017 5:14:33 PM By: Gretta Cool, BSN, RN, CWS, Kim RN, BSN Previous Signature: 01/09/2017 4:36:42 PM Version By: Linton Ham MD Entered By: Gretta Cool, BSN, RN, CWS, Kim on 01/09/2017 16:36:31 Bussey, Leslye [name] ([id number]) -------------------------------------------------------------------------------- Multi-Disciplinary Care Plan Details Patient Name: VICKY, MCCANLESS. Date of Service: 01/09/2017 3:30 PM Medical Record Number: [id number] Patient Account Number: [id number] Date of Birth/Sex: 02/03/1951 (66 y.o. Male) Treating RN: Cornell Barman Primary Care Lai Hendriks: Otilio Miu Other Clinician: Referring Shatia Sindoni: Otilio Miu Treating Aviyanna Colbaugh/Extender: Ricard Dillon Weeks  in Treatment: 59 Active Inactive ` Nutrition Nursing Diagnoses: Imbalanced nutrition Impaired glucose control: actual or potential Goals: Patient/caregiver agrees to and verbalizes understanding of need to use nutritional supplements and/or vitamins as prescribed Date Initiated: 01/24/2016 Target Resolution Date: 03/27/2016 Goal Status: Active Patient/caregiver will maintain therapeutic glucose control Date Initiated: 01/24/2016 Target Resolution Date: 03/27/2016 Goal Status: Active Interventions: Assess patient nutrition upon admission and as needed per policy Provide education on elevated blood sugars and impact on wound healing Notes: ` Orientation to the Wound Care Program Nursing Diagnoses: Knowledge deficit related to the wound healing center program Goals: Patient/caregiver will verbalize understanding of the [place] Date Initiated: 01/24/2016 Target Resolution Date: 03/27/2016 Goal Status: Active Interventions: Provide education on orientation to the wound center Notes: ` Pain, Acute or Chronic Nursing Diagnoses: Pain, acute or chronic: actual or potential Potential alteration in comfort, pain Tumbleson, Kaion C. ([id number]) Goals: Patient will verbalize adequate pain control and receive pain control interventions during procedures as needed Date Initiated: 01/24/2016 Target Resolution Date: 03/27/2016 Goal Status: Active Patient/caregiver will verbalize adequate pain control between visits Date Initiated: 01/24/2016 Target Resolution Date: 03/27/2016 Goal Status: Active Patient/caregiver will verbalize comfort level  met Date Initiated: 01/24/2016 Target Resolution Date: 03/27/2016 Goal Status: Active Interventions: Assess comfort goal upon admission Complete pain assessment as per visit requirements Notes: ` Pressure Nursing Diagnoses: Knowledge deficit related to management of pressures ulcers Potential for impaired tissue integrity  related to pressure, friction, moisture, and shear Goals: Patient will remain free from development of additional pressure ulcers Date Initiated: 01/24/2016 Target Resolution Date: 03/27/2016 Goal Status: Active Interventions: Assess: immobility, friction, shearing, incontinence upon admission and as needed Assess offloading mechanisms upon admission and as needed Assess potential for pressure ulcer upon admission and as needed Provide education on pressure ulcers Notes: ` Wound/Skin Impairment Nursing Diagnoses: Impaired tissue integrity Goals: Ulcer/skin breakdown will have a volume reduction of 30% by week 4 Date Initiated: 01/24/2016 Target Resolution Date: 03/27/2016 Goal Status: Active Ulcer/skin breakdown will have a volume reduction of 50% by week 8 Date Initiated: 01/24/2016 Target Resolution Date: 03/27/2016 Goal Status: Active Ulcer/skin breakdown will have a volume reduction of 80% by week 12 Date Initiated: 01/24/2016 Target Resolution Date: 03/27/2016 DEQUON, SCHNEBLY ([id number]) Goal Status: Active Interventions: Assess patient/caregiver ability to perform ulcer/skin care regimen upon admission and as needed Assess ulceration(s) every visit Notes: Electronic Signature(s) Signed: 01/09/2017 5:14:33 PM By: Gretta Cool, BSN, RN, CWS, Kim RN, BSN Entered By: Gretta Cool, BSN, RN, CWS, Kim on 01/09/2017 16:36:20 Anastas, Leslye [name] ([id number]) -------------------------------------------------------------------------------- Pain Assessment Details Patient Name: [name] Date of Service: 01/09/2017 3:30 PM Medical Record Number: [id number] Patient Account Number: [id number] Date of Birth/Sex: [birth year] (66 y.o. Male) Treating RN: Cornell Barman Primary Care Cionna Collantes: Otilio Miu Other Clinician: Referring Margene Cherian: Otilio Miu Treating Lavin Petteway/Extender: Ricard Dillon Weeks in Treatment: 50 Active Problems Location of Pain Severity and Description of Pain Patient  Has Paino No Site Locations With Dressing Change: No Pain Management and Medication Current Pain Management: Electronic Signature(s) Signed: 01/09/2017 5:14:33 PM By: Gretta Cool, BSN, RN, CWS, Kim RN, BSN Entered By: Gretta Cool, BSN, RN, CWS, Kim on 01/09/2017 15:27:51 [place]ett, Leslye [name] ([id number]) -------------------------------------------------------------------------------- Wound Assessment Details Patient Name: MATIN, MATTIOLI C. Date of Service: 01/09/2017 3:30 PM Medical Record Number: [id number] Patient Account Number: [id number] Date of Birth/Sex: 06-17-1950 (66 y.o. Male) Treating RN: Cornell Barman Primary Care Kelcey Korus: Otilio Miu Other Clinician: Referring Annalie Wenner: Otilio Miu Treating Alondra Sahni/Extender: Ricard Dillon Weeks in Treatment: 50 Wound Status Wound Number: 4 Primary Pressure Ulcer Etiology: Wound Location: Left Gluteus Wound Open Wounding Event: Pressure Injury Status: Date Acquired: 07/09/2016 Comorbid Coronary Artery Disease, Type II Diabetes, Weeks Of Treatment: 21 History: Quadriplegia, Seizure Disorder Clustered Wound: No Photos Wound Measurements Length: (cm) 2 Width: (cm) 3 Depth: (cm) 0.2 Area: (cm) 4.712 Volume: (cm) 0.942 % Reduction in Area: -2901.3% % Reduction in Volume: -5787.5% Epithelialization: None Tunneling: No Undermining: No Wound Description Classification: Unstageable/Unclassified Wound Margin: Flat and Intact Exudate Amount: Medium Exudate Type: Serosanguineous Exudate Color: red, brown Foul Odor After Cleansing: No Slough/Fibrino Yes Wound Bed Granulation Amount: None Present (0%) Exposed Structure Necrotic Amount: Large (67-100%) Fascia Exposed: No Necrotic Quality: Eschar, Adherent Slough Fat Layer (Subcutaneous Tissue) Exposed: Yes Tendon Exposed: No Muscle Exposed: No Joint Exposed: No Bone Exposed: No Periwound Skin Texture Texture Color No Abnormalities Noted: No No Abnormalities Noted: No Ecchymosis:  Yes Moisture No Abnormalities Noted: No Exline, Jamari C. ([id number]) Wound Preparation Ulcer Cleansing: Rinsed/Irrigated with Saline Topical Anesthetic Applied: Other: lidocaine 4%, Treatment Notes Wound #4 (Left Gluteus) 1. Cleansed with: Clean wound with Normal Saline 2. Anesthetic Topical Lidocaine 4% cream to wound bed prior to debridement  4. Dressing Applied: Medihoney Gel 5. Secondary Dressing Applied Bordered Foam Dressing Electronic Signature(s) Signed: 01/09/2017 4:44:57 PM By: Roger Shelter Signed: 01/09/2017 5:14:33 PM By: Gretta Cool, BSN, RN, CWS, Kim RN, BSN Entered By: Roger Shelter on 01/09/2017 15:53:46 [place], Leslye [name] ([id number]) -------------------------------------------------------------------------------- Wound Assessment Details Patient Name: [name], Jamori C. Date of Service: 01/09/2017 3:30 PM Medical Record Number: [id number] Patient Account Number: [id number] Date of Birth/Sex: 1951-02-16 (66 y.o. Male) Treating RN: Cornell Barman Primary Care Sula Fetterly: Otilio Miu Other Clinician: Referring Sameria Morss: Otilio Miu Treating Dajia Gunnels/Extender: Ricard Dillon Weeks in Treatment: 50 Wound Status Wound Number: 5 Primary Pressure Ulcer Etiology: Wound Location: Right Gluteus Wound Open Wounding Event: Pressure Injury Status: Date Acquired: 09/10/2016 Comorbid Coronary Artery Disease, Type II Diabetes, Weeks Of Treatment: 16 History: Quadriplegia, Seizure Disorder Clustered Wound: No Photos Wound Measurements Length: (cm) 5.4 % Reduction Width: (cm) 5.5 % Reduction Depth: (cm) 0.7 Epitheliali Area: (cm) 23.326 Underminin Volume: (cm) 16.328 Startin Ending P Maximum in Area: -312.5% in Volume: -2789.9% zation: None g: Yes g Position (o'clock): 6 osition (o'clock): 11 Distance: (cm) 1.8 Wound Description Classification: Category/Stage IV Foul Odor A Wound Margin: Flat and Intact Due to Prod Exudate Amount: Large  Slough/Fibr Exudate Type: Serous Exudate Color: amber fter Cleansing: Yes uct Use: No ino Yes Wound Bed Granulation Amount: Medium (34-66%) Exposed Structure Granulation Quality: Red Fascia Exposed: No Necrotic Amount: Medium (34-66%) Fat Layer (Subcutaneous Tissue) Exposed: Yes Necrotic Quality: Eschar, Adherent Slough Tendon Exposed: No Muscle Exposed: Yes Necrosis of Muscle: No Joint Exposed: No Bone Exposed: No Periwound Skin Texture Lenzen, Kourtney C. ([id number]) Texture Color No Abnormalities Noted: No No Abnormalities Noted: No Induration: Yes Erythema: Yes Erythema Location: Circumferential Moisture No Abnormalities Noted: No Temperature / Pain Temperature: No Abnormality Tenderness on Palpation: Yes Wound Preparation Ulcer Cleansing: Rinsed/Irrigated with Saline Topical Anesthetic Applied: Other: lidocaine 4%, Treatment Notes Wound #5 (Right Gluteus) 1. Cleansed with: Clean wound with Normal Saline 2. Anesthetic Topical Lidocaine 4% cream to wound bed prior to debridement 4. Dressing Applied: Medihoney Gel 5. Secondary Dressing Applied Bordered Foam Dressing Electronic Signature(s) Signed: 01/09/2017 4:44:57 PM By: Roger Shelter Signed: 01/09/2017 5:14:33 PM By: Gretta Cool, BSN, RN, CWS, Kim RN, BSN Entered By: Roger Shelter on 01/09/2017 15:54:11 Jester, Leslye [name] ([id number]) -------------------------------------------------------------------------------- Wound Assessment Details Patient Name: Rowlette, Eli C. Date of Service: 01/09/2017 3:30 PM Medical Record Number: [id number] Patient Account Number: [id number] Date of Birth/Sex: 1951/01/08 (66 y.o. Male) Treating RN: Cornell Barman Primary Care Issak Goley: Otilio Miu Other Clinician: Referring Gianni Mihalik: Otilio Miu Treating Dinah Lupa/Extender: Ricard Dillon Weeks in Treatment: 50 Wound Status Wound Number: 6 Primary Pressure Ulcer Etiology: Wound Location: Sacrum - Medial Wound  Open Wounding Event: Pressure Injury Status: Date Acquired: 10/16/2016 Comorbid Coronary Artery Disease, Type II Diabetes, Weeks Of Treatment: 11 History: Quadriplegia, Seizure Disorder Clustered Wound: No Photos Wound Measurements Length: (cm) 2 Width: (cm) 5.2 Depth: (cm) 0.8 Area: (cm) 8.168 Volume: (cm) 6.535 % Reduction in Area: 61.5% % Reduction in Volume: -208.1% Epithelialization: None Tunneling: No Undermining: No Wound Description Classification: Category/Stage II Wound Margin: Flat and Intact Exudate Amount: Large Exudate Type: Serosanguineous Exudate Color: red, brown Foul Odor After Cleansing: No Slough/Fibrino Yes Wound Bed Granulation Amount: None Present (0%) Exposed Structure Necrotic Amount: Large (67-100%) Fascia Exposed: No Necrotic Quality: Eschar, Adherent Slough Fat Layer (Subcutaneous Tissue) Exposed: Yes Tendon Exposed: No Muscle Exposed: No Joint Exposed: No Bone Exposed: No Periwound Skin Texture Texture Color No Abnormalities Noted: No No Abnormalities Noted: No Ecchymosis:  Yes Moisture No Abnormalities Noted: No XAIDYN, KEPNER ([id number]) Wound Preparation Topical Anesthetic Applied: Other: lidocaine 4%, Treatment Notes Wound #6 (Medial Sacrum) 1. Cleansed with: Clean wound with Normal Saline 2. Anesthetic Topical Lidocaine 4% cream to wound bed prior to debridement 4. Dressing Applied: Medihoney Gel 5. Secondary Dressing Applied Bordered Foam Dressing Electronic Signature(s) Signed: 01/09/2017 4:44:57 PM By: Roger Shelter Signed: 01/09/2017 5:14:33 PM By: Gretta Cool, BSN, RN, CWS, Kim RN, BSN Entered By: Roger Shelter on 01/09/2017 15:54:39 [place], Leslye [name] ([id number]) -------------------------------------------------------------------------------- Vitals Details Patient Name: GERMAINE, SHENKER C. Date of Service: 01/09/2017 3:30 PM Medical Record Number: [id number] Patient Account Number: [id number] Date of  Birth/Sex: 05-29-1950 (66 y.o. Male) Treating RN: Cornell Barman Primary Care Nilton Lave: Otilio Miu Other Clinician: Referring Mariella Blackwelder: Otilio Miu Treating Goldye Tourangeau/Extender: Ricard Dillon Weeks in Treatment: 50 Vital Signs Time Taken: 15:27 Temperature (F): 97.6 Height (in): 70 Pulse (bpm): 94 Weight (lbs): 187 Respiratory Rate (breaths/min): 16 Body Mass Index (BMI): 26.8 Blood Pressure (mmHg): 128/61 Reference Range: 80 - 120 mg / dl Electronic Signature(s) Signed: 01/09/2017 5:14:33 PM By: Gretta Cool, BSN, RN, CWS, Kim RN, BSN Entered By: Gretta Cool, BSN, RN, CWS, Kim on 01/09/2017 15:28:17

## 2017-01-15 ENCOUNTER — Encounter: Payer: Medicare Other | Admitting: Internal Medicine

## 2017-01-15 DIAGNOSIS — R569 Unspecified convulsions: Secondary | ICD-10-CM | POA: Diagnosis not present

## 2017-01-15 DIAGNOSIS — L03317 Cellulitis of buttock: Secondary | ICD-10-CM | POA: Diagnosis not present

## 2017-01-15 DIAGNOSIS — L89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, stage 3: Secondary | ICD-10-CM | POA: Diagnosis not present

## 2017-01-15 DIAGNOSIS — L89153 Pressure ulcer of sacral region, stage 3: Secondary | ICD-10-CM | POA: Diagnosis not present

## 2017-01-15 DIAGNOSIS — E119 Type 2 diabetes mellitus without complications: Secondary | ICD-10-CM | POA: Diagnosis not present

## 2017-01-15 DIAGNOSIS — Z8673 Personal history of transient ischemic attack (TIA), and cerebral infarction without residual deficits: Secondary | ICD-10-CM | POA: Diagnosis not present

## 2017-01-15 DIAGNOSIS — G8252 Quadriplegia, C1-C4 incomplete: Secondary | ICD-10-CM | POA: Diagnosis not present

## 2017-01-15 DIAGNOSIS — I959 Hypotension, unspecified: Secondary | ICD-10-CM | POA: Diagnosis not present

## 2017-01-15 DIAGNOSIS — K219 Gastro-esophageal reflux disease without esophagitis: Secondary | ICD-10-CM | POA: Diagnosis not present

## 2017-01-15 DIAGNOSIS — S14103S Unspecified injury at C3 level of cervical spinal cord, sequela: Secondary | ICD-10-CM | POA: Diagnosis not present

## 2017-01-15 DIAGNOSIS — L89320 Pressure ulcer of left buttock, unstageable: Secondary | ICD-10-CM | POA: Diagnosis not present

## 2017-01-15 DIAGNOSIS — L89314 Pressure ulcer of right buttock, stage 4: Secondary | ICD-10-CM | POA: Diagnosis not present

## 2017-01-15 DIAGNOSIS — Z7984 Long term (current) use of oral hypoglycemic drugs: Secondary | ICD-10-CM | POA: Diagnosis not present

## 2017-01-15 DIAGNOSIS — L89310 Pressure ulcer of right buttock, unstageable: Secondary | ICD-10-CM | POA: Diagnosis not present

## 2017-01-15 DIAGNOSIS — S51011A Laceration without foreign body of right elbow, initial encounter: Secondary | ICD-10-CM | POA: Diagnosis not present

## 2017-01-15 DIAGNOSIS — L89322 Pressure ulcer of left buttock, stage 2: Secondary | ICD-10-CM | POA: Diagnosis not present

## 2017-01-16 ENCOUNTER — Encounter: Payer: Medicare Other | Admitting: Physician Assistant

## 2017-01-16 DIAGNOSIS — L89130 Pressure ulcer of right lower back, unstageable: Secondary | ICD-10-CM | POA: Diagnosis not present

## 2017-01-17 NOTE — Progress Notes (Signed)
[name], [name] ([id number]) Visit Report for 01/09/2017 Debridement Details Patient Name: [name], [name]. Date of Service: 01/09/2017 3:30 PM Medical Record Number: [id number] Patient Account Number: [id number] Date of Birth/Sex: March 19, 1950 (66 y.o. Male) Treating RN: Huel Coventry Primary Care Provider: Elizabeth Sauer Other Clinician: Referring Provider: Elizabeth Sauer Treating Provider/Extender: Altamese [place] in Treatment: 50 Debridement Performed for Wound #4 Left Gluteus Assessment: Performed By: Physician Maxwell Caul, MD Debridement: Debridement Pre-procedure Verification/Time Yes - 16:00 Out Taken: Start Time: 16:00 Pain Control: Other : lidocaine 4% Level: Skin/Subcutaneous Tissue Total Area Debrided (L x W): 2 (cm) x 3 (cm) = 6 (cm) Tissue and other material Viable, Non-Viable, Fibrin/Slough, Subcutaneous debrided: Instrument: Blade, Curette Bleeding: Large Hemostasis Achieved: Silver Nitrate End Time: 16:05 Procedural Pain: 0 Post Procedural Pain: 0 Response to Treatment: Procedure was tolerated well Post Debridement Measurements of Total Wound Length: (cm) 2 Stage: Unstageable/Unclassified Width: (cm) 3 Depth: (cm) 0.5 Volume: (cm) 2.356 Character of Wound/Ulcer Post Requires Further Debridement Debridement: Post Procedure Diagnosis Same as Pre-procedure Electronic Signature(s) Signed: 01/09/2017 4:36:42 PM By: Baltazar Najjar MD Signed: 01/09/2017 5:14:33 PM By: Elliot Gurney, BSN, RN, CWS, Kim RN, BSN Entered By: Baltazar Najjar on 01/09/2017 16:23:16 Bobier, [name] ([id number]) -------------------------------------------------------------------------------- Debridement Details Patient Name: [name], [name] Date of Service: 01/09/2017 3:30 PM Medical Record Number: [id number] Patient Account Number: [id number] Date of Birth/Sex: October 29, 1950 (66 y.o. Male) Treating RN: Huel Coventry Primary Care Provider: Elizabeth Sauer Other  Clinician: Referring Provider: Elizabeth Sauer Treating Provider/Extender: Altamese [place] in Treatment: 50 Debridement Performed for Wound #5 Right Gluteus Assessment: Performed By: Physician Maxwell Caul, MD Debridement: Debridement Pre-procedure Verification/Time Yes - 16:00 Out Taken: Start Time: 16:00 Pain Control: Other : lidocaine 4% Level: Skin/Subcutaneous Tissue Total Area Debrided (L x W): 5.4 (cm) x 3 (cm) = 16.2 (cm) Tissue and other material Viable, Non-Viable, Fibrin/Slough, Skin, Subcutaneous debrided: Instrument: Blade, Curette, Forceps Bleeding: Large Hemostasis Achieved: Silver Nitrate End Time: 16:05 Procedural Pain: 0 Post Procedural Pain: 0 Response to Treatment: Procedure was tolerated well Post Debridement Measurements of Total Wound Length: (cm) 5.4 Stage: Category/Stage IV Width: (cm) 5.9 Depth: (cm) 0.8 Volume: (cm) 20.018 Character of Wound/Ulcer Post Requires Further Debridement Debridement: Post Procedure Diagnosis Same as Pre-procedure Electronic Signature(s) Signed: 01/09/2017 4:36:42 PM By: Baltazar Najjar MD Signed: 01/09/2017 5:14:33 PM By: Elliot Gurney, BSN, RN, CWS, Kim RN, BSN Entered By: Baltazar Najjar on 01/09/2017 16:23:28 Denise, [name] ([id number]) -------------------------------------------------------------------------------- Debridement Details Patient Name: [name], [name] Date of Service: 01/09/2017 3:30 PM Medical Record Number: [id number] Patient Account Number: [id number] Date of Birth/Sex: 1950-09-28 (66 y.o. Male) Treating RN: Huel Coventry Primary Care Provider: Elizabeth Sauer Other Clinician: Referring Provider: Elizabeth Sauer Treating Provider/Extender: Altamese [place] in Treatment: 50 Debridement Performed for Wound #6 Medial Sacrum Assessment: Performed By: Physician Maxwell Caul, MD Debridement: Debridement Pre-procedure Verification/Time Yes - 16:00 Out Taken: Start Time:  16:00 Pain Control: Other : lidocaine 4% Level: Skin/Subcutaneous Tissue Total Area Debrided (L x W): 2 (cm) x 5.2 (cm) = 10.4 (cm) Tissue and other material Viable, Non-Viable, Fibrin/Slough, Subcutaneous debrided: Instrument: Curette Bleeding: Minimum Hemostasis Achieved: Pressure End Time: 16:05 Procedural Pain: 0 Post Procedural Pain: 0 Response to Treatment: Procedure was tolerated well Post Debridement Measurements of Total Wound Length: (cm) 2 Stage: Category/Stage II Width: (cm) 5.2 Depth: (cm) 1.2 Volume: (cm) 9.802 Character of Wound/Ulcer Post Requires Further Debridement Debridement: Post Procedure Diagnosis Same as Pre-procedure Electronic Signature(s) Signed: 01/09/2017 4:36:42 PM By:  Baltazar Najjar MD Signed: 01/09/2017 5:14:33 PM By: Elliot Gurney, BSN, RN, CWS, Kim RN, BSN Entered By: Baltazar Najjar on 01/09/2017 16:23:52 Sweeting, [name] ([id number]) -------------------------------------------------------------------------------- HPI Details Patient Name: [name], [name] Date of Service: 01/09/2017 3:30 PM Medical Record Number: [id number] Patient Account Number: [id number] Date of Birth/Sex: Jan 13, 1951 (66 y.o. 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 He has lower extremity sensation and can move his legs but has no/limited control over his arms. His wife accompanies him today and states that in the late spring or early summer of 2017 the patient became very depressed. He refused the refused to mobilize and he developed several pressure ulcers on his back. Most of these have healed however they have a recalcitrant area over  the right scapula. They've been using Santyl on this for at least the last month. In terms of depression the patient is doing better now on an antidepressant. He saw his primary physician on 01/12/16 at which time there was apparently green drainage coming out of this area [Dr. Deana Jones]. Dr. Yetta Barre works in the [facility]. He has completed this Septra. Otherwise looking through [facility] notes that he has a history of seizures. He also had a stroke in 2008 he follows with neurology. He also has type 2 diabetes on Glucophage, hyperlipidemia and gastroesophageal reflux. He takes Plavix for stroke prevention and Keppra for seizure prophylaxis. 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 02/08/16; this is a patient with a pressure ulcer over the right scapula. His wife has been doing the dressing with Santyl and border foam change every second day. When he arrived here 2 weeks ago we did a fairly extensive mechanical debridement. We are asked medical modalities to go out to the home and see what they might be eligible for in terms of pressure-relief surfaces [level 2] but the wife states that they have not heard from them. 03/20/15; this is a patient we haven't seen in 5-6 weeks. This was largely due to transportation issues. They've been using Santyl and border foam changing every second day for a pressure injury over the right scapula. The patient has a C3-C4 spinal injury. We had also asked for a review by the people who supplied DME to look at his wheelchair cushion, mattress etc. I don't know that this ever happened. In the meantime the wound has done remarkably well current measurements 1.8 x 2 x 0.1 04/03/16; the patient's dimensions have gone up to 3 cm in diameter quite a deterioration from last time. He is also complaining of pain in this area which is new. 04/10/16; 3 x 1.5 x 0.1. No difference  from last week. Culture I did last week was negative he has completed antibiotics. 04/24/16 2.4 x 2 x 0.1; wound generally looks smaller. Middle area that I had to debrided last time looks healthier. His son is fashioned a large piece of foam cut out where the patient's wound with hit the back of his wheelchair 05/01/16; wound is a same size however the surface of this looks better. The middle innkeeper area required a repeat debridement 05/15/16; wound is  down and dimensions granulation still looks healthy. The medial aspect of this wound is now the deeper [place]. We'll see how this responds to further healing. We're using Hydrofera Blue 06/05/16; Wound 1.7x1.1x0.1 still using hyudrofera blue 06/26/16; the patient arrives back in clinic after a three-week hiatus. He was hospitalized from 06/09/16 through 06/10/16. He was found to be confused. CT scan of the head showed nothing really acute. His sodium was low at 131. He was rehydrated. He was felt to have a UTI and given antibiotics and antibiotics at discharge although his final culture result only showed multiple organisms. His wife says today that at the time of the hospitalization they discovered a large intact blister over the large aspect of his left calcaneus. This is recently ruptured. The wound on his right scapula is somewhat larger. More his wife is concerned about his current status. She states that he is still confused sleeps for long periods. He is not having fever chills cough or diarrhea [1 loose bowel movement per day]. He continues to have a suprapubic catheter. She notes that he is not eating and drinking well. Patient states he just does not want to eat. He does not feel nauseated or vomit. 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 Admission sodium was 131 at discharge 139 BUN 22 and 1.22 at admission, 17 and 1 at discharge. 07/03/16; patient's mental  status is back to normal. He has a new wound on the right elbow caused by traumatizing his elbow against the wall apparently at the dermatologist office last week. We continue with the original wound on the right scapula and the wound from 2 weeks ago on his left heel. We have been using silver alginate to the area on the heel. KAIDIN, BOEHLE ([id number]) 08/14/16; patient has not been here in almost 6 weeks. When he was here last time he had the pressure ulcer on the right scapula, atraumatic wound on the right lateral elbow and an area on his left heel. His wife states that at one point all of these were healed and she didn't really feel he needed to come back here. Since then the patient has developed a reopening of the area on the right scapula, a stage II wound on the left buttock and a reopening of the area on the right lateral elbow. As usual his wife as a litany of complaints against home health, she is dismissing or is going to dismiss well care. She tells [place] she has a long list of supplies at home already for some reason they were not felt to be eligible for a group 2 or 3 surface although they have a hospital bed at home but no offloading surface 08/28/16; the patient arrived today unfortunately incontinent of stool in spite of this the wounds all appear to be better including the right scapular area, his left buttock's left heel and the right lateral elbow. 09/18/16; this is a patient who arrived today for follow-up of a pressure area over his right scapula area, left buttock and there was an area on his right lateral elbow. He arrived in clinic today with a worrisome area over the right initial tuberosity/right buttock. This had a necrotic surface with draining purulence. He has not been systemically unwell. The area over the left elbow healed. He arrived in clinic today with dressing that hadn't been changed over the scapula for 2 or 3 days per  her intake. His wife is previously fired  home health 09/25/16;; the last time the patient was here he arrived with a new grossly infected wound over the right ischial tuberosity. Culture I did not show a specific pathogen. I did think this required admission to hospital and he was admitted from 09/18/16 through 09/20/16. Initially given vanc and Zosyn but then discharged with 5 days' worth of Augmentin. An MRI was done that did not show osteomyelitis of the right ischial tuberosity however it did show cellulitis and possible myositis. He also has wounds on the left ischial tuberosity and the area over the right scapular area. 10/04/16 on evaluation today patient appears to be doing better in regard to his back wound and is stable in regard to the gluteal wounds. There does not appear to be any evidence of significant infection at this point. He is tolerating the dressing changes currently. His wife has been performing the dressing changes. Nonetheless he does have some discomfort especially in the gluteal areas although he did not specifically rate the discomfort there were times during evaluation where he flinched and it was obvious it was bothering him. No fevers, chills, nausea, or vomiting noted at this time. 10/17/16; the patient has bilateral ischial tuberosity wounds right greater than left. The left seems to be making good progress wears the right has not really changed that much and dimensions. There is also some threatened area on the right side around the wound circumference. Equally worrisome there is a DTI over the lower sacral area however this is not open as of yet. His area over the left scapula which is the chronic wound he was coming to the clinic continues to make excellent progress 8/28 The patient comes in today wanting to talk about getting back in his wheelchair. He is very bored lying in bed at home 11/14/16; three-week hiatus for this patient for medical illnesses whether etc. He was in the emergency room at [facility]  2 days ago when his wife noted odor coming out of the right initial tuberosity wound and discoloration. I reviewed his ER presentation from 11/12/16. White count was 11.7 differential count reasonably normal basic metabolic panel was normal. Serum lactate was 1.9. He has a suprapubic catheter which showed positive urine nitrate large leukocytes and many bacteria. Urine cultures come back showing multiple species. Blood culture was negative. Wound culture is still pending. He was put on Keflex and Bactrim. The patient had an MRI of this area on 09/18/16 that did not show osteomyelitis however at that time there was possible cellulitis and infectious myositis no drainable fluid collection. After this he wishes admitted the hospital had vancomycin and Zosyn for 5 days and then 5 days of Augmentin 11/20/16; culture of the wound that I did last week showed multiple organisms. This was the same as from the emergency room as it turns out. He is still on Keflex and Bactrim and completing this. I was really expecting something a little more ominous although he seems to have stabilized. We have been using silver alginate to the major wound on the right ischial tuberosity. He had a DTI over the right greater trochanter, superficial wound over the left ischial tuberosity and a DTI on the superior part of the left hemipelvis. FORTUNATELY everything looks a little better here than last week. At my suggestion his wife looked into [facility] but was told that he needed a 3 day acute hospital stay. This would be similar to what is required  for skilled nursing and I was not specifically aware of this although it may have something to do with the patient's specific insurance 11/27/16; he has completed his antibiotics. All of his wounds appeared to have stabilized. This includes the necrotic right ischial tuberosity wound, DTI on the superior pelvis, DTI over the left greater trochanter. We have been using  Aquacel Ag to all wound areas. He arrives in clinic today with a low temperature at first not registering at all then 91.5. After serial checks with different instruments we got this up to 94 although the patient looks fine is mentating normally. His wife reports that this is often the pattern when he goes to the hospital and that they'll put him in a heating blanket and send him home although I don't really see evidence of this on the most recent ER trips. She states she has a warming blanket and a rectal for mom under the registers down to 91. I've asked her to check his temperature when she goes home. They both seemed very reluctant to go to [facility]. ([id number]) the emergency department over this and to be truthful he looks stable enough to avoid this at least for now 12/03/16; patient arrives with all of his wounds and a considerably better situation. His wife states she has been changing him and turning him. The major remaining wound is in the right ischial tuberosity over even it looks well granulated and the copious amounts of necrotic tissue that I took out several weeks ago with coexistent infection looks to be resolved. We have been using silver alginate to all wounds 12/18/16; patient arrives for his two-week follow-up as usual accompanied by his wife. Unfortunately things have not gone well. They tell me that as of Friday evening he started to complain of severe pain in the lower buttock area presumably around the wounds although I'm not completely certain of that. This is different for this patient to rarely complains of any pain. They also noted significant odor to the wounds. We have been using silver alginate. He has not had any falls. No fever [ or hypothermia]. He was at his primary doctor in [place] today and had lab work done. 12/26/16; patient's deep tissue culture from last week grew Proteus and MRSA. He started Septra DS and Augmentin late 2 days ago. He is not  spontaneously complaining of as much pain as last week although the patient's wife states when she changes the dressing he complains of pain. He apparently refused to go and have x-rays of the pelvis done to look at the right ischial tuberosity specifically. We have been using silver alginate largely because of the necrotic wound in the lower part of the right ischial tuberosity. He has not been systemically unwell 01/02/17; patient's pelvic x-ray did not show underlying bony destruction. He still has considerable amount of necrotic debris in the lower part of his right ischial tuberosity. The lower sacral wound and left ischial tuberosity also have very necrotic surfaces. He has been on Septra and Augmentin purulent drainage out of the right ischial tuberosity and they're completing 2 weeks of therapy which I would like to extend for another 2 weeks. He would not be an easy candidate for an MRI of the right pelvis although this may become necessary. ROS; a 10 point review of systems is negative. Specifically no diarrhea fever or chills 01/09/17; continues on antibiotics for the purulent drainage they came out of the right initial tuberosity wound growing Proteus and  MRSA. He should have enough for another week. I have not done more advanced imaging however his plain x-ray did not show bony destruction. We have been using silver alginate. Arrives in today with aggressive debridements of all 3 wounds. I am less worried about soft tissue infection on the right Electronic Signature(s) Signed: 01/09/2017 4:36:42 PM By: Baltazar Najjar MD Entered By: Baltazar Najjar on 01/09/2017 16:25:04 Kintzel, [name] ([id number]) -------------------------------------------------------------------------------- Physical Exam Details Patient Name: Bonnie, [name] Date of Service: 01/09/2017 3:30 PM Medical Record Number: [id number] Patient Account Number: [id number] Date of Birth/Sex: 1950/07/14 (66 y.o.  Male) Treating RN: Huel Coventry Primary Care Provider: Elizabeth Sauer Other Clinician: Referring Provider: Elizabeth Sauer Treating Provider/Extender: Maxwell Caul Weeks in Treatment: 50 Notes Wound exam; the lower third of the right initial tuberosity still has necrotic material. Using a #5 curet the base of the wound is aggressively debrided. Necrotic material around the circumference of the lower part of the wound debrided with pickups and a scalpel. Hemostasis with silver nitrate and direct pressure oThe sacral wound has no viable surface in spite of an aggressive debridement. I am concerned that this may ultimately go down to bone. oLeft visual tuberosity also requires an aggressive debridement. This does not seem to be such an ominous wound. Electronic Signature(s) Signed: 01/09/2017 4:36:42 PM By: Baltazar Najjar MD Entered By: Baltazar Najjar on 01/09/2017 16:26:28 Pevey, [name] ([id number]) -------------------------------------------------------------------------------- Physician Orders Details Patient Name: AMAY, MIJANGOS C. Date of Service: 01/09/2017 3:30 PM Medical Record Number: [id number] Patient Account Number: [id number] Date of Birth/Sex: 19-Jan-1951 (66 y.o. Male) Treating RN: Huel Coventry Primary Care Provider: Elizabeth Sauer Other Clinician: Referring Provider: Elizabeth Sauer Treating Provider/Extender: Altamese [place] in Treatment: 77 Verbal / Phone Orders: No Diagnosis Coding ICD-10 Coding Code Description [phone] Pressure ulcer of other site, stage 3 S14.103S Unspecified injury at C3 level of cervical spinal cord, sequela S51.011A Laceration without foreign body of right elbow, initial encounter L89.322 Pressure ulcer of left buttock, stage 2 L89.310 Pressure ulcer of right buttock, unstageable L03.317 Cellulitis of buttock Wound Cleansing Wound #4 Left Gluteus o Clean wound with Normal Saline. o Cleanse wound with mild soap and water Wound #5  Right Gluteus o Clean wound with Normal Saline. o Cleanse wound with mild soap and water Wound #6 Medial Sacrum o Clean wound with Normal Saline. o Cleanse wound with mild soap and water Anesthetic Wound #4 Left Gluteus o Topical Lidocaine 4% cream applied to wound bed prior to debridement Wound #5 Right Gluteus o Topical Lidocaine 4% cream applied to wound bed prior to debridement Wound #6 Medial Sacrum o Topical Lidocaine 4% cream applied to wound bed prior to debridement Primary Wound Dressing Wound #4 Left Gluteus o Other: - Medihoney Wound #5 Right Gluteus o Other: - Medihoney Wound #6 Medial Sacrum o Other: - Medihoney Plotz, Brentyn C. ([id number]) Secondary Dressing Wound #4 Left Gluteus o Boardered Foam Dressing Wound #5 Right Gluteus o Boardered Foam Dressing Wound #6 Medial Sacrum o Boardered Foam Dressing Dressing Change Frequency Wound #4 Left Gluteus o Change dressing every day. Wound #5 Right Gluteus o Change dressing every day. Wound #6 Medial Sacrum o Change dressing every day. Follow-up Appointments Wound #4 Left Gluteus o Return Appointment in 1 week. Wound #5 Right Gluteus o Return Appointment in 1 week. Wound #6 Medial Sacrum o Return Appointment in 1 week. Off-Loading Wound #4 Left Gluteus o Turn and reposition every 2 hours Wound #5 Right Gluteus o Turn and reposition  every 2 hours Wound #6 Medial Sacrum o Turn and reposition every 2 hours Additional Orders / Instructions Wound #4 Left Gluteus o Increase protein intake. Wound #5 Right Gluteus o Increase protein intake. Wound #6 Medial Sacrum o Increase protein intake. Medications-please add to medication list. Wound #4 Left Gluteus o Other: - Vitamins A, C and Zinc Wound #5 Right Gluteus Schuchart, [name] BienRICHARD C. ([id number]) o Other: - Vitamins A, C and Zinc Wound #6 Medial Sacrum o Other: - Vitamins A, C and Zinc Electronic  Signature(s) Signed: 01/09/2017 5:14:33 PM By: Elliot GurneyWoody, BSN, RN, CWS, Kim RN, BSN Signed: 01/16/2017 8:07:00 AM By: Baltazar Najjarobson, Michael MD Entered By: Elliot GurneyWoody, BSN, RN, CWS, Kim on 01/09/2017 16:38:18 Grigoryan, [name] BienICHARD C. ([id number]) -------------------------------------------------------------------------------- Problem List Details Patient Name: [name], [name] Date of Service: 01/09/2017 3:30 PM Medical Record Number: [id number] Patient Account Number: [id number] Date of Birth/Sex: 12/08/1950 [age] y.o. Male) Treating RN: Huel CoventryWoody, Kim Primary Care Provider: Elizabeth SauerJones, Deanna Other Clinician: Referring Provider: Elizabeth SauerJones, Deanna Treating Provider/Extender: Maxwell CaulOBSON, MICHAEL G Weeks in Treatment: 850 Active Problems ICD-10 Encounter Code Description Active Date Diagnosis 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, stage 3 01/24/2016 Yes S14.103S Unspecified injury at C3 level of cervical spinal cord, sequela 01/24/2016 Yes S51.011A Laceration without foreign body of right elbow, initial encounter 07/03/2016 Yes L89.322 Pressure ulcer of left buttock, stage 2 08/14/2016 Yes L89.310 Pressure ulcer of right buttock, unstageable 09/18/2016 Yes L03.317 Cellulitis of buttock 09/18/2016 Yes Inactive Problems Resolved Problems ICD-10 Code Description Active Date Resolved Date L89.622 Pressure ulcer of left heel, stage 2 06/26/2016 06/26/2016 Electronic Signature(s) Signed: 01/09/2017 4:36:42 PM By: Baltazar Najjarobson, Michael MD Entered By: Baltazar Najjarobson, Michael on 01/09/2017 16:22:47 Vidaurri, [name] BienICHARD C. ([id number]) -------------------------------------------------------------------------------- Progress Note Details Patient Name: [name], [name] Date of Service: 01/09/2017 3:30 PM Medical Record Number: [id number] Patient Account Number: [id number] Date of Birth/Sex: 12/08/1950 [age] y.o. 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 He has lower extremity sensation and can move his legs but has no/limited control over his arms. His wife accompanies him today and states that in the late spring or early summer of 2017 the patient became very depressed. He refused the refused to mobilize and he developed several pressure ulcers on his back. Most of these have healed however they have a recalcitrant area over the right scapula. They've been using Santyl on this for at least the last month. In terms of depression the patient is doing better now on an antidepressant. He saw his primary physician on 01/12/16 at which time there was apparently green drainage coming out of this area [Dr. Deana Jones]. Dr. Yetta BarreJones works in the [facility]. He has completed this Septra. Otherwise looking through [facility] notes that he has a history of seizures. He also had a stroke in 2008 he follows with neurology. He also has type 2 diabetes on Glucophage, hyperlipidemia and gastroesophageal reflux. He takes Plavix for stroke prevention and Keppra for seizure prophylaxis. 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 02/08/16; this is a patient with a pressure ulcer over the right scapula. His wife has been doing the dressing with Santyl and border  foam change every second day. When he arrived here 2 weeks ago we did a fairly extensive mechanical debridement. We are asked medical modalities to go out to the home and see what they might be eligible for in terms of pressure-relief surfaces [level 2] but the wife states that they have not heard from them. 03/20/15; this is a patient we haven't seen in 5-6 weeks. This was largely due to  transportation issues. They've been using Santyl and border foam changing every second day for a pressure injury over the right scapula. The patient has a C3-C4 spinal injury. We had also asked for a review by the people who supplied DME to look at his wheelchair cushion, mattress etc. I don't know that this ever happened. In the meantime the wound has done remarkably well current measurements 1.8 x 2 x 0.1 04/03/16; the patient's dimensions have gone up to 3 cm in diameter quite a deterioration from last time. He is also complaining of pain in this area which is new. 04/10/16; 3 x 1.5 x 0.1. No difference from last week. Culture I did last week was negative he has completed antibiotics. 04/24/16 2.4 x 2 x 0.1; wound generally looks smaller. Middle area that I had to debrided last time looks healthier. His son is fashioned a large piece of foam cut out where the patient's wound with hit the back of his wheelchair 05/01/16; wound is a same size however the surface of this looks better. The middle innkeeper area required a repeat debridement 05/15/16; wound is down and dimensions granulation still looks healthy. The medial aspect of this wound is now the deeper Divot. We'll see how this responds to further healing. We're using Hydrofera Blue 06/05/16; Wound 1.7x1.1x0.1 still using hyudrofera blue 06/26/16; the patient arrives back in clinic after a three-week hiatus. He was hospitalized from 06/09/16 through 06/10/16. He was found to be confused. CT scan of the head showed nothing really acute. His sodium was low at 131. He was rehydrated. He was felt to have a UTI and given antibiotics and antibiotics at discharge although his final culture result only showed multiple organisms. His wife says today that at the time of the hospitalization they discovered a large intact blister over the large aspect of his left calcaneus. This is recently ruptured. The wound on his right scapula is somewhat larger. More his  wife is concerned about his current status. She states that he is still confused sleeps for long periods. He is not having fever chills cough or diarrhea [1 loose bowel movement per day]. He continues to have a suprapubic catheter. She notes that he is not eating and drinking well. Patient states he just does not want to eat. He does not feel nauseated or vomit. 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 Admission sodium was 131 at discharge 139 BUN 22 and 1.22 at admission, 17 and 1 at discharge. 07/03/16; patient's mental status is back to normal. He has a new wound on the right elbow caused by traumatizing his elbow against the wall apparently at the dermatologist office last week. We continue with the original wound on the right scapula and Lilley, Zakariyah C. ([id number]) the wound from 2 weeks ago on his left heel. We have been using silver alginate to the area on the heel. 08/14/16; patient has not been here in almost 6 weeks. When he was here last time he had  the pressure ulcer on the right scapula, atraumatic wound on the right lateral elbow and an area on his left heel. His wife states that at one point all of these were healed and she didn't really feel he needed to come back here. Since then the patient has developed a reopening of the area on the right scapula, a stage II wound on the left buttock and a reopening of the area on the right lateral elbow. As usual his wife as a litany of complaints against home health, she is dismissing or is going to dismiss well care. She tells [place] she has a long list of supplies at home already for some reason they were not felt to be eligible for a group 2 or 3 surface although they have a hospital bed at home but no offloading surface 08/28/16; the patient arrived today unfortunately incontinent of stool in spite of this the wounds all appear to be better including the right  scapular area, his left buttock's left heel and the right lateral elbow. 09/18/16; this is a patient who arrived today for follow-up of a pressure area over his right scapula area, left buttock and there was an area on his right lateral elbow. He arrived in clinic today with a worrisome area over the right initial tuberosity/right buttock. This had a necrotic surface with draining purulence. He has not been systemically unwell. The area over the left elbow healed. He arrived in clinic today with dressing that hadn't been changed over the scapula for 2 or 3 days per her intake. His wife is previously fired home health 09/25/16;; the last time the patient was here he arrived with a new grossly infected wound over the right ischial tuberosity. Culture I did not show a specific pathogen. I did think this required admission to hospital and he was admitted from 09/18/16 through 09/20/16. Initially given vanc and Zosyn but then discharged with 5 days' worth of Augmentin. An MRI was done that did not show osteomyelitis of the right ischial tuberosity however it did show cellulitis and possible myositis. He also has wounds on the left ischial tuberosity and the area over the right scapular area. 10/04/16 on evaluation today patient appears to be doing better in regard to his back wound and is stable in regard to the gluteal wounds. There does not appear to be any evidence of significant infection at this point. He is tolerating the dressing changes currently. His wife has been performing the dressing changes. Nonetheless he does have some discomfort especially in the gluteal areas although he did not specifically rate the discomfort there were times during evaluation where he flinched and it was obvious it was bothering him. No fevers, chills, nausea, or vomiting noted at this time. 10/17/16; the patient has bilateral ischial tuberosity wounds right greater than left. The left seems to be making good progress wears  the right has not really changed that much and dimensions. There is also some threatened area on the right side around the wound circumference. Equally worrisome there is a DTI over the lower sacral area however this is not open as of yet. His area over the left scapula which is the chronic wound he was coming to the clinic continues to make excellent progress 8/28 The patient comes in today wanting to talk about getting back in his wheelchair. He is very bored lying in bed at home 11/14/16; three-week hiatus for this patient for medical illnesses whether etc. He was in the emergency room at  [facility] 2 days ago when his wife noted odor coming out of the right initial tuberosity wound and discoloration. I reviewed his ER presentation from 11/12/16. White count was 11.7 differential count reasonably normal basic metabolic panel was normal. Serum lactate was 1.9. He has a suprapubic catheter which showed positive urine nitrate large leukocytes and many bacteria. Urine cultures come back showing multiple species. Blood culture was negative. Wound culture is still pending. He was put on Keflex and Bactrim. The patient had an MRI of this area on 09/18/16 that did not show osteomyelitis however at that time there was possible cellulitis and infectious myositis no drainable fluid collection. After this he wishes admitted the hospital had vancomycin and Zosyn for 5 days and then 5 days of Augmentin 11/20/16; culture of the wound that I did last week showed multiple organisms. This was the same as from the emergency room as it turns out. He is still on Keflex and Bactrim and completing this. I was really expecting something a little more ominous although he seems to have stabilized. We have been using silver alginate to the major wound on the right ischial tuberosity. He had a DTI over the right greater trochanter, superficial wound over the left ischial tuberosity and a DTI on the superior part of the left  hemipelvis. FORTUNATELY everything looks a little better here than last week. At my suggestion his wife looked into [facility] but was told that he needed a 3 day acute hospital stay. This would be similar to what is required for skilled nursing and I was not specifically aware of this although it may have something to do with the patient's specific insurance 11/27/16; he has completed his antibiotics. All of his wounds appeared to have stabilized. This includes the necrotic right ischial tuberosity wound, DTI on the superior pelvis, DTI over the left greater trochanter. We have been using Aquacel Ag to all wound areas. He arrives in clinic today with a low temperature at first not registering at all then 91.5. After serial checks with different instruments we got this up to 94 although the patient looks fine is mentating normally. His wife reports that this is often the pattern when he goes to the hospital and that they'll put him in a heating blanket and send him home although I don't really see evidence of this on the most recent ER trips. She states she has a warming blanket and a rectal for mom under the [facility], Kaeo C. ([id number]) registers down to 91. I've asked her to check his temperature when she goes home. They both seemed very reluctant to go to the emergency department over this and to be truthful he looks stable enough to avoid this at least for now 12/03/16; patient arrives with all of his wounds and a considerably better situation. His wife states she has been changing him and turning him. The major remaining wound is in the right ischial tuberosity over even it looks well granulated and the copious amounts of necrotic tissue that I took out several weeks ago with coexistent infection looks to be resolved. We have been using silver alginate to all wounds 12/18/16; patient arrives for his two-week follow-up as usual accompanied by his wife. Unfortunately things have  not gone well. They tell me that as of Friday evening he started to complain of severe pain in the lower buttock area presumably around the wounds although I'm not completely certain of that. This is different for this patient to rarely  complains of any pain. They also noted significant odor to the wounds. We have been using silver alginate. He has not had any falls. No fever [ or hypothermia]. He was at his primary doctor in [place] today and had lab work done. 12/26/16; patient's deep tissue culture from last week grew Proteus and MRSA. He started Septra DS and Augmentin late 2 days ago. He is not spontaneously complaining of as much pain as last week although the patient's wife states when she changes the dressing he complains of pain. He apparently refused to go and have x-rays of the pelvis done to look at the right ischial tuberosity specifically. We have been using silver alginate largely because of the necrotic wound in the lower part of the right ischial tuberosity. He has not been systemically unwell 01/02/17; patient's pelvic x-ray did not show underlying bony destruction. He still has considerable amount of necrotic debris in the lower part of his right ischial tuberosity. The lower sacral wound and left ischial tuberosity also have very necrotic surfaces. He has been on Septra and Augmentin purulent drainage out of the right ischial tuberosity and they're completing 2 weeks of therapy which I would like to extend for another 2 weeks. He would not be an easy candidate for an MRI of the right pelvis although this may become necessary. ROS; a 10 point review of systems is negative. Specifically no diarrhea fever or chills 01/09/17; continues on antibiotics for the purulent drainage they came out of the right initial tuberosity wound growing Proteus and MRSA. He should have enough for another week. I have not done more advanced imaging however his plain x-ray did not show bony destruction.  We have been using silver alginate. Arrives in today with aggressive debridements of all 3 wounds. I am less worried about soft tissue infection on the right Objective Constitutional Vitals Time Taken: 3:27 PM, Height: 70 in, Weight: 187 lbs, BMI: 26.8, Temperature: 97.6 F, Pulse: 94 bpm, Respiratory Rate: 16 breaths/min, Blood Pressure: 128/61 mmHg. Integumentary (Hair, Skin) Wound #4 status is Open. Original cause of wound was Pressure Injury. The wound is located on the Left Gluteus. The wound measures 2cm length x 3cm width x 0.2cm depth; 4.712cm^2 area and 0.942cm^3 volume. There is Fat Layer (Subcutaneous Tissue) Exposed exposed. There is no tunneling or undermining noted. There is a medium amount of serosanguineous drainage noted. The wound margin is flat and intact. There is no granulation within the wound bed. There is a large (67-100%) amount of necrotic tissue within the wound bed including Eschar and Adherent Slough. The periwound skin appearance exhibited: Ecchymosis. Wound #5 status is Open. Original cause of wound was Pressure Injury. The wound is located on the Right Gluteus. The wound measures 5.4cm length x 5.5cm width x 0.7cm depth; 23.326cm^2 area and 16.328cm^3 volume. There is muscle and Fat Layer (Subcutaneous Tissue) Exposed exposed. There is undermining starting at 6:00 and ending at 11:00 with a maximum distance of 1.8cm. There is a large amount of serous drainage noted. Foul odor after cleansing was noted. The wound margin is flat and intact. There is medium (34-66%) red granulation within the wound bed. There is a medium (34- 66%) amount of necrotic tissue within the wound bed including Eschar and Adherent Slough. The periwound skin appearance exhibited: Induration, Erythema. The surrounding wound skin color is noted with erythema which is circumferential. Periwound temperature was noted as No Abnormality. The periwound has tenderness on palpation. JAMESPAUL, SECRIST  ([id number]) Wound #6 status  is Open. Original cause of wound was Pressure Injury. The wound is located on the Medial Sacrum. The wound measures 2cm length x 5.2cm width x 0.8cm depth; 8.168cm^2 area and 6.535cm^3 volume. There is Fat Layer (Subcutaneous Tissue) Exposed exposed. There is no tunneling or undermining noted. There is a large amount of serosanguineous drainage noted. The wound margin is flat and intact. There is no granulation within the wound bed. There is a large (67-100%) amount of necrotic tissue within the wound bed including Eschar and Adherent Slough. The periwound skin appearance exhibited: Ecchymosis. 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 There was a Skin/Subcutaneous Tissue Debridement ([id number]) debridement with total area of 6 sq cm performed by Maxwell Caul, MD. with the following instrument(s): Blade and Curette to remove Viable and Non-Viable tissue/material including Fibrin/Slough and Subcutaneous after achieving pain control using Other (lidocaine 4%). A time out was conducted at 16:00, prior to the start of the procedure. A Large amount of bleeding was controlled with Silver Nitrate. The procedure was tolerated well with a pain level of 0 throughout and a pain level of 0 following the procedure. Post Debridement Measurements: 2cm length x 3cm width x 0.5cm depth; 2.356cm^3 volume. Post debridement Stage noted as Unstageable/Unclassified. Character of Wound/Ulcer Post Debridement requires further debridement. Post procedure  Diagnosis Wound #4: Same as Pre-Procedure Wound #5 Pre-procedure diagnosis of Wound #5 is a Pressure Ulcer located on the Right Gluteus . There was a Skin/Subcutaneous Tissue Debridement ([id number]) debridement with total area of 16.2 sq cm performed by Maxwell Caul, MD. with the following instrument(s): Blade, Curette, and Forceps to remove Viable and Non-Viable tissue/material including Fibrin/Slough, Skin, and Subcutaneous after achieving pain control using Other (lidocaine 4%). A time out was conducted at 16:00, prior to the start of the procedure. A Large amount of bleeding was controlled with Silver Nitrate. The procedure was tolerated well with a pain level of 0 throughout and a pain level of 0 following the procedure. Post Debridement Measurements: 5.4cm length x 5.9cm width x 0.8cm depth; 20.018cm^3 volume. Post debridement Stage noted as Category/Stage IV. Character of Wound/Ulcer Post Debridement requires further debridement. Post procedure Diagnosis Wound #5: Same as Pre-Procedure Wound #6 Pre-procedure diagnosis of Wound #6 is a Pressure Ulcer located on the Medial Sacrum . There was a Skin/Subcutaneous Tissue Debridement ([id number]) debridement with total area of 10.4 sq cm performed by Maxwell Caul, MD. with the following instrument(s): Curette to remove Viable and Non-Viable tissue/material including Fibrin/Slough and Rena, Leonce C. ([id number]) Subcutaneous after achieving pain control using Other (lidocaine 4%). A time out was conducted at 16:00, prior to the start of the procedure. A Minimum amount of bleeding was controlled with Pressure. The procedure was tolerated well with a pain level of 0 throughout and a pain level of 0 following the procedure. Post Debridement Measurements: 2cm length x 5.2cm width x 1.2cm depth; 9.802cm^3 volume. Post debridement Stage noted as Category/Stage II. Character of Wound/Ulcer Post Debridement requires further  debridement. Post procedure Diagnosis Wound #6: Same as Pre-Procedure Plan Wound Cleansing: Wound #4 Left Gluteus: Clean wound  with Normal Saline. Cleanse wound with mild soap and water Wound #5 Right Gluteus: Clean wound with Normal Saline. Cleanse wound with mild soap and water Wound #6 Medial Sacrum: Clean wound with Normal Saline. Cleanse wound with mild soap and water Anesthetic: Wound #4 Left Gluteus: Topical Lidocaine 4% cream applied to wound bed prior to debridement Wound #5 Right Gluteus: Topical Lidocaine 4% cream applied to wound bed prior to debridement Wound #6 Medial Sacrum: Topical Lidocaine 4% cream applied to wound bed prior to debridement Primary Wound Dressing: Wound #4 Left Gluteus: Other: - Medihoney Wound #5 Right Gluteus: Other: - Medihoney Wound #6 Medial Sacrum: Other: - Medihoney Secondary Dressing: Wound #4 Left Gluteus: Boardered Foam Dressing Wound #5 Right Gluteus: Boardered Foam Dressing Wound #6 Medial Sacrum: Boardered Foam Dressing Dressing Change Frequency: Wound #4 Left Gluteus: Change dressing every day. Wound #5 Right Gluteus: Change dressing every day. Wound #6 Medial Sacrum: Change dressing every day. Follow-up Appointments: Wound #4 Left Gluteus: Return Appointment in 1 week. Wound #5 Right Gluteus: Return Appointment in 1 week. Wound #6 Medial Sacrum: Return Appointment in 1 week. JAHSHUA, [name] ([id number]) Off-Loading: Wound #4 Left Gluteus: Turn and reposition every 2 hours Wound #5 Right Gluteus: Turn and reposition every 2 hours Wound #6 Medial Sacrum: Turn and reposition every 2 hours Additional Orders / Instructions: Wound #4 Left Gluteus: Increase protein intake. Wound #5 Right Gluteus: Increase protein intake. Wound #6 Medial Sacrum: Increase protein intake. Medications-please add to medication list.: Wound #4 Left Gluteus: Other: - Vitamins A, C and Zinc Wound #5 Right Gluteus: Other: - Vitamins  A, C and Zinc Wound #6 Medial Sacrum: Other: - Vitamins A, C and Zinc o 1 3 of this patient's wounds are going to need enzymatic/chemical debridement. Santyl seems to be beyond their financial means. I therefore elected to use medihoney for at least the next 2 weeks. Further mechanical debridement is likely to be necessary.The dressings are being changed daily by his wife #2 he has enough antibiotics for the next week and then I think we can stop the antibiotics and watch the condition of the wound. In terms of the soft tissue infection he had in the right initial tuberosity this seems better there is less drainage. #3 so far I've elected not to pursue an MRI to rule out bone infection although that may turn out to be necessary Electronic Signature(s) Signed: 01/14/2017 3:26:45 PM By: Elliot Gurney, BSN, RN, CWS, Kim RN, BSN Signed: 01/16/2017 8:07:00 AM By: Baltazar Najjar MD Previous Signature: 01/09/2017 4:36:42 PM Version By: Baltazar Najjar MD Entered By: Elliot Gurney, BSN, RN, CWS, Kim on 01/14/2017 15:26:44 Digirolamo, [name] ([id number]) -------------------------------------------------------------------------------- SuperBill Details Patient Name: [name], [name]. Date of Service: 01/09/2017 Medical Record Number: [id number] Patient Account Number: [id number] Date of Birth/Sex: October 14, 1950 (66 y.o. Male) Treating RN: Huel Coventry Primary Care Provider: Elizabeth Sauer Other Clinician: Referring Provider: Elizabeth Sauer Treating Provider/Extender: Maxwell Caul Weeks in Treatment: 49 Diagnosis Coding ICD-10 Codes Code Description [phone] Pressure ulcer of other site, stage 3 S14.103S Unspecified injury at C3 level of cervical spinal cord, sequela S51.011A Laceration without foreign body of right elbow, initial encounter L89.322 Pressure ulcer of left buttock, stage 2 L89.310 Pressure ulcer of right buttock, unstageable L03.317 Cellulitis of buttock Facility Procedures CPT4 Code:  [id number] Description: 11042 - DEB SUBQ TISSUE 20 SQ CM/< ICD-10 Diagnosis Description L89.310 Pressure ulcer of right buttock, unstageable 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, stage 3 L89.322 Pressure ulcer of left buttock, stage  2 Modifier: Quantity: 1 CPT4 Code: [id number] Description: 11045 - DEB SUBQ TISS EA ADDL 20CM ICD-10 Diagnosis Description L89.310 Pressure ulcer of right buttock, unstageable 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, stage 3 Modifier: Quantity: 1 Physician Procedures CPT4 Code: [id number] Description: 11042 - WC PHYS SUBQ TISS 20 SQ CM ICD-10 Diagnosis Description L89.310 Pressure ulcer of right buttock, unstageable 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 ([id number]) Entered By: Baltazar Najjarobson, Michael on 01/09/2017 16:34:42

## 2017-01-17 NOTE — Progress Notes (Signed)
[name] ([id number]) Visit Report for 01/15/2017 Debridement Details Patient Name: [name], [name]. Date of Service: 01/15/2017 11:00 AM Medical Record Number: [id number] Patient Account Number: [id number] Date of Birth/Sex: [birth year] (66 y.o. Male) Treating RN: Renne Crigler Primary Care Provider: Elizabeth Sauer Other Clinician: Referring Provider: Elizabeth Sauer Treating Provider/Extender: Altamese [place] in Treatment: 42 Debridement Performed for Wound #4 Left Gluteus Assessment: Performed By: Physician Maxwell Caul, MD Debridement: Debridement Pre-procedure Verification/Time Yes - 11:00 Out Taken: Start Time: 11:00 Pain Control: Other : lidocaine 4% Level: Skin/Subcutaneous Tissue/Muscle Total Area Debrided (L x W): 2.6 (cm) x 2.5 (cm) = 6.5 (cm) Tissue and other material Viable, Non-Viable, Eschar, Fibrin/Slough, Muscle, Subcutaneous debrided: Instrument: Curette Bleeding: Moderate Hemostasis Achieved: Pressure End Time: 11:03 Procedural Pain: 5 Post Procedural Pain: 0 Response to Treatment: Procedure was tolerated well Post Debridement Measurements of Total Wound Length: (cm) 2.6 Stage: Unstageable/Unclassified Width: (cm) 2.5 Depth: (cm) 0.6 Volume: (cm) 3.063 Character of Wound/Ulcer Post Stable Debridement: Post Procedure Diagnosis Same as Pre-procedure Electronic Signature(s) Signed: 01/15/2017 4:36:56 PM By: Renne Crigler Signed: 01/16/2017 8:07:00 AM By: Baltazar Najjar MD Entered By: Baltazar Najjar on 01/15/2017 12:25:35 Morrisette, [name] ([id number]) -------------------------------------------------------------------------------- Debridement Details Patient Name: [name] Bras C. Date of Service: 01/15/2017 11:00 AM Medical Record Number: [id number] Patient Account Number: [id number] Date of Birth/Sex: Jun 09, 1950 (66 y.o. Male) Treating RN: Renne Crigler Primary Care Provider: Elizabeth Sauer Other  Clinician: Referring Provider: Elizabeth Sauer Treating Provider/Extender: Altamese [place] in Treatment: 85 Debridement Performed for Wound #5 Right Gluteus Assessment: Performed By: Physician Maxwell Caul, MD Debridement: Debridement Pre-procedure Verification/Time Yes - 11:00 Out Taken: Start Time: 11:00 Pain Control: Other : lidocaine 4% Level: Skin/Subcutaneous Tissue/Muscle Total Area Debrided (L x W): 2.1 (cm) x 2.4 (cm) = 5.04 (cm) Tissue and other material Viable, Non-Viable, Eschar, Fibrin/Slough, Muscle, Subcutaneous debrided: Instrument: Curette Bleeding: Moderate Hemostasis Achieved: Pressure End Time: 11:03 Procedural Pain: 5 Post Procedural Pain: 0 Response to Treatment: Procedure was tolerated well Post Debridement Measurements of Total Wound Length: (cm) 4.3 Stage: Category/Stage IV Width: (cm) 4.7 Depth: (cm) 1.4 Volume: (cm) 22.222 Character of Wound/Ulcer Post Stable Debridement: Post Procedure Diagnosis Same as Pre-procedure Electronic Signature(s) Signed: 01/15/2017 4:36:56 PM By: Renne Crigler Signed: 01/16/2017 8:07:00 AM By: Baltazar Najjar MD Entered By: Baltazar Najjar on 01/15/2017 12:25:43 Bretado, [name] ([id number]) -------------------------------------------------------------------------------- Debridement Details Patient Name: [name] Bras C. Date of Service: 01/15/2017 11:00 AM Medical Record Number: [id number] Patient Account Number: [id number] Date of Birth/Sex: December 01, 1950 (66 y.o. Male) Treating RN: Renne Crigler Primary Care Provider: Elizabeth Sauer Other Clinician: Referring Provider: Elizabeth Sauer Treating Provider/Extender: Altamese [place] in Treatment: 57 Debridement Performed for Wound #6 Medial Sacrum Assessment: Performed By: Physician Maxwell Caul, MD Debridement: Debridement Pre-procedure Verification/Time Yes - 11:00 Out Taken: Start Time: 11:00 Pain Control: Other :  lidocaine 4% Level: Skin/Subcutaneous Tissue/Muscle Total Area Debrided (L x W): 2.2 (cm) x 4.3 (cm) = 9.46 (cm) Tissue and other material Viable, Non-Viable, Eschar, Fibrin/Slough, Muscle, Subcutaneous debrided: Instrument: Curette Bleeding: Moderate Hemostasis Achieved: Pressure End Time: 11:03 Procedural Pain: 5 Post Procedural Pain: 0 Response to Treatment: Procedure was tolerated well Post Debridement Measurements of Total Wound Length: (cm) 2.2 Stage: Category/Stage III Width: (cm) 4.3 Depth: (cm) 1 Volume: (cm) 7.43 Character of Wound/Ulcer Post Stable Debridement: Post Procedure Diagnosis Same as Pre-procedure Electronic Signature(s) Signed: 01/15/2017 4:36:56 PM By: Renne Crigler Signed: 01/16/2017 8:07:00 AM By: Baltazar Najjar MD Entered By: Baltazar Najjar on 01/15/2017  12:25:54 TRAXTON, KOLENDA ([id number]) -------------------------------------------------------------------------------- HPI Details Patient Name: [name]. Date of Service: 01/15/2017 11:00 AM Medical Record Number: [id number] Patient Account Number: [id number] Date of Birth/Sex: February 13, 1951 (66 y.o. 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 He has lower extremity sensation and can move his legs but has no/limited control over his arms. His wife accompanies him today and states that in the late spring or early summer of 2017 the patient became very depressed. He refused the refused to mobilize and he developed several pressure ulcers on his back. Most of these have healed however they have a recalcitrant area over the right scapula. They've been using  Santyl on this for at least the last month. In terms of depression the patient is doing better now on an antidepressant. He saw his primary physician on 01/12/16 at which time there was apparently green drainage coming out of this area [Dr. Deana Jones]. Dr. Yetta Barre works in the [facility]. He has completed this Septra. Otherwise looking through [facility] notes that he has a history of seizures. He also had a stroke in 2008 he follows with neurology. He also has type 2 diabetes on Glucophage, hyperlipidemia and gastroesophageal reflux. He takes Plavix for stroke prevention and Keppra for seizure prophylaxis. 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 02/08/16; this is a patient with a pressure ulcer over the right scapula. His wife has been doing the dressing with Santyl and border foam change every second day. When he arrived here 2 weeks ago we did a fairly extensive mechanical debridement. We are asked medical modalities to go out to the home and see what they might be eligible for in terms of pressure-relief surfaces [level 2] but the wife states that they have not heard from them. 03/20/15; this is a patient we haven't seen in 5-6 weeks. This was largely due to transportation issues. They've been using Santyl and border foam changing every second day for a pressure injury over the right scapula. The patient has a C3-C4 spinal injury. We had also asked for a review by the people who supplied DME to look at his wheelchair cushion, mattress etc. I don't know that this ever happened. In the meantime the wound has done remarkably well current measurements 1.8 x 2 x 0.1 04/03/16; the patient's dimensions have gone up to 3 cm in diameter quite a deterioration from last time. He is also complaining of pain in this area which is new. 04/10/16; 3 x 1.5 x 0.1. No difference from last week. Culture I did last  week was negative he has completed antibiotics. 04/24/16 2.4 x 2 x 0.1; wound generally looks smaller. Middle area that I had to debrided last time looks healthier. His son is fashioned a large piece of foam cut out where the patient's wound with hit the back of his wheelchair 05/01/16; wound is a same size however the surface of this looks better. The middle innkeeper area required a repeat debridement 05/15/16; wound is down and dimensions granulation still looks healthy. The medial aspect of this wound is now the deeper [place]. We'll see how  this responds to further healing. We're using Hydrofera Blue 06/05/16; Wound 1.7x1.1x0.1 still using hyudrofera blue 06/26/16; the patient arrives back in clinic after a three-week hiatus. He was hospitalized from 06/09/16 through 06/10/16. He was found to be confused. CT scan of the head showed nothing really acute. His sodium was low at 131. He was rehydrated. He was felt to have a UTI and given antibiotics and antibiotics at discharge although his final culture result only showed multiple organisms. His wife says today that at the time of the hospitalization they discovered a large intact blister over the large aspect of his left calcaneus. This is recently ruptured. The wound on his right scapula is somewhat larger. More his wife is concerned about his current status. She states that he is still confused sleeps for long periods. He is not having fever chills cough or diarrhea [1 loose bowel movement per day]. He continues to have a suprapubic catheter. She notes that he is not eating and drinking well. Patient states he just does not want to eat. He does not feel nauseated or vomit. 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 Admission sodium was 131 at discharge 139 BUN 22 and 1.22 at admission, 17 and 1 at discharge. 07/03/16; patient's mental status is back to normal. He has a  new wound on the right elbow caused by traumatizing his elbow against the wall apparently at the dermatologist office last week. We continue with the original wound on the right scapula and the wound from 2 weeks ago on his left heel. We have been using silver alginate to the area on the heel. JEMUEL, LAURSEN ([id number]) 08/14/16; patient has not been here in almost 6 weeks. When he was here last time he had the pressure ulcer on the right scapula, atraumatic wound on the right lateral elbow and an area on his left heel. His wife states that at one point all of these were healed and she didn't really feel he needed to come back here. Since then the patient has developed a reopening of the area on the right scapula, a stage II wound on the left buttock and a reopening of the area on the right lateral elbow. As usual his wife as a litany of complaints against home health, she is dismissing or is going to dismiss well care. She tells [place] she has a long list of supplies at home already for some reason they were not felt to be eligible for a group 2 or 3 surface although they have a hospital bed at home but no offloading surface 08/28/16; the patient arrived today unfortunately incontinent of stool in spite of this the wounds all appear to be better including the right scapular area, his left buttock's left heel and the right lateral elbow. 09/18/16; this is a patient who arrived today for follow-up of a pressure area over his right scapula area, left buttock and there was an area on his right lateral elbow. He arrived in clinic today with a worrisome area over the right initial tuberosity/right buttock. This had a necrotic surface with draining purulence. He has not been systemically unwell. The area over the left elbow healed. He arrived in clinic today with dressing that hadn't been changed over the scapula for 2 or 3 days per her intake. His wife is previously fired home health 09/25/16;; the last time  the patient was here he arrived with  a new grossly infected wound over the right ischial tuberosity. Culture I did not show a specific pathogen. I did think this required admission to hospital and he was admitted from 09/18/16 through 09/20/16. Initially given vanc and Zosyn but then discharged with 5 days' worth of Augmentin. An MRI was done that did not show osteomyelitis of the right ischial tuberosity however it did show cellulitis and possible myositis. He also has wounds on the left ischial tuberosity and the area over the right scapular area. 10/04/16 on evaluation today patient appears to be doing better in regard to his back wound and is stable in regard to the gluteal wounds. There does not appear to be any evidence of significant infection at this point. He is tolerating the dressing changes currently. His wife has been performing the dressing changes. Nonetheless he does have some discomfort especially in the gluteal areas although he did not specifically rate the discomfort there were times during evaluation where he flinched and it was obvious it was bothering him. No fevers, chills, nausea, or vomiting noted at this time. 10/17/16; the patient has bilateral ischial tuberosity wounds right greater than left. The left seems to be making good progress wears the right has not really changed that much and dimensions. There is also some threatened area on the right side around the wound circumference. Equally worrisome there is a DTI over the lower sacral area however this is not open as of yet. His area over the left scapula which is the chronic wound he was coming to the clinic continues to make excellent progress 8/28 The patient comes in today wanting to talk about getting back in his wheelchair. He is very bored lying in bed at home 11/14/16; three-week hiatus for this patient for medical illnesses whether etc. He was in the emergency room at  2 days ago when his wife noted odor  coming out of the right initial tuberosity wound and discoloration. I reviewed his ER presentation from 11/12/16. White count was 11.7 differential count reasonably normal basic metabolic panel was normal. Serum lactate was 1.9. He has a suprapubic catheter which showed positive urine nitrate large leukocytes and many bacteria. Urine cultures come back showing multiple species. Blood culture was negative. Wound culture is still pending. He was put on Keflex and Bactrim. The patient had an MRI of this area on 09/18/16 that did not show osteomyelitis however at that time there was possible cellulitis and infectious myositis no drainable fluid collection. After this he wishes admitted the hospital had vancomycin and Zosyn for 5 days and then 5 days of Augmentin 11/20/16; culture of the wound that I did last week showed multiple organisms. This was the same as from the emergency room as it turns out. He is still on Keflex and Bactrim and completing this. I was really expecting something a little more ominous although he seems to have stabilized. We have been using silver alginate to the major wound on the right ischial tuberosity. He had a DTI over the right greater trochanter, superficial wound over the left ischial tuberosity and a DTI on the superior part of the left hemipelvis. FORTUNATELY everything looks a little better here than last week. At my suggestion his wife looked into [facility] but was told that he needed a 3 day acute hospital stay. This would be similar to what is required for skilled nursing and I was not specifically aware of this although it may have something to do with the patient's  specific insurance 11/27/16; he has completed his antibiotics. All of his wounds appeared to have stabilized. This includes the necrotic right ischial tuberosity wound, DTI on the superior pelvis, DTI over the left greater trochanter. We have been using Aquacel Ag to all wound areas. He  arrives in clinic today with a low temperature at first not registering at all then 91.5. After serial checks with different instruments we got this up to 94 although the patient looks fine is mentating normally. His wife reports that this is often the pattern when he goes to the hospital and that they'll put him in a heating blanket and send him home although I don't really see evidence of this on the most recent ER trips. She states she has a warming blanket and a rectal for mom under the registers down to 91. I've asked her to check his temperature when she goes home. They both seemed very reluctant to go to [facility] ([id number]) the emergency department over this and to be truthful he looks stable enough to avoid this at least for now 12/03/16; patient arrives with all of his wounds and a considerably better situation. His wife states she has been changing him and turning him. The major remaining wound is in the right ischial tuberosity over even it looks well granulated and the copious amounts of necrotic tissue that I took out several weeks ago with coexistent infection looks to be resolved. We have been using silver alginate to all wounds 12/18/16; patient arrives for his two-week follow-up as usual accompanied by his wife. Unfortunately things have not gone well. They tell me that as of Friday evening he started to complain of severe pain in the lower buttock area presumably around the wounds although I'm not completely certain of that. This is different for this patient to rarely complains of any pain. They also noted significant odor to the wounds. We have been using silver alginate. He has not had any falls. No fever [ or hypothermia]. He was at his primary doctor in [place] today and had lab work done. 12/26/16; patient's deep tissue culture from last week grew Proteus and MRSA. He started Septra DS and Augmentin late 2 days ago. He is not spontaneously complaining of as much pain  as last week although the patient's wife states when she changes the dressing he complains of pain. He apparently refused to go and have x-rays of the pelvis done to look at the right ischial tuberosity specifically. We have been using silver alginate largely because of the necrotic wound in the lower part of the right ischial tuberosity. He has not been systemically unwell 01/02/17; patient's pelvic x-ray did not show underlying bony destruction. He still has considerable amount of necrotic debris in the lower part of his right ischial tuberosity. The lower sacral wound and left ischial tuberosity also have very necrotic surfaces. He has been on Septra and Augmentin purulent drainage out of the right ischial tuberosity and they're completing 2 weeks of therapy which I would like to extend for another 2 weeks. He would not be an easy candidate for an MRI of the right pelvis although this may become necessary. ROS; a 10 point review of systems is negative. Specifically no diarrhea fever or chills 01/09/17; continues on antibiotics for the purulent drainage they came out of the right initial tuberosity wound growing Proteus and MRSA. He should have enough for another week. I have not done more advanced imaging however his plain x-ray did not  show bony destruction. We have been using silver alginate. Arrives in today with aggressive debridements of all 3 wounds. I am less worried about soft tissue infection on the right 01/15/17; he is completing his antibiotics for Proteus and MRSA growing out of the right ischial tuberosity this appears to be stable. Imaging here did not show osteomyelitis although I have not done an MRI. We use Medihoney last week not a lot of real difference. Still extensive necrotic debris. Unfortunately she does not have an out-of-pocket max and Santyl cost her $260 per tube in the past, this is beyond her means. Electronic Signature(s) Signed: 01/16/2017 8:07:00 AM By: Baltazar Najjar MD Entered By: Baltazar Najjar on 01/15/2017 12:27:28 Stash, [name] ([id number]) -------------------------------------------------------------------------------- Physical Exam Details Patient Name: SAMUAL, BEALS C. Date of Service: 01/15/2017 11:00 AM Medical Record Number: [id number] Patient Account Number: [id number] Date of Birth/Sex: [birth year] (66 y.o. Male) Treating RN: Renne Crigler Primary Care Provider: Elizabeth Sauer Other Clinician: Referring Provider: Elizabeth Sauer Treating Provider/Extender: Maxwell Caul Weeks in Treatment: 61 Notes wound exam the patient has 3 necrotic areas including the lower part of the right ischial tuberosity lower sacrum and the left ischial tuberosity. All of these undergo difficult debridements. Paradoxically the right ischial tuberosity wound looks like some viable surface although the other 2 certainly don't. Ideally Santyl would be the treatment of choice here we just don't have that option. Electronic Signature(s) Signed: 01/16/2017 8:07:00 AM By: Baltazar Najjar MD Entered By: Baltazar Najjar on 01/15/2017 12:28:29 Lacasse, [name] ([id number]) -------------------------------------------------------------------------------- Physician Orders Details Patient Name: ADEBAYO, ENSMINGER. Date of Service: 01/15/2017 11:00 AM Medical Record Number: [id number] Patient Account Number: [id number] Date of Birth/Sex: Oct 17, 1950 (66 y.o. Male) Treating RN: Renne Crigler Primary Care Provider: Elizabeth Sauer Other Clinician: Referring Provider: Elizabeth Sauer Treating Provider/Extender: Altamese [place] in Treatment: 74 Verbal / Phone Orders: No Diagnosis Coding Wound Cleansing Wound #4 Left Gluteus o Clean wound with Normal Saline. o Cleanse wound with mild soap and water Wound #5 Right Gluteus o Clean wound with Normal Saline. o Cleanse wound with mild soap and water Wound #6 Medial Sacrum o Clean wound with  Normal Saline. o Cleanse wound with mild soap and water Anesthetic Wound #4 Left Gluteus o Topical Lidocaine 4% cream applied to wound bed prior to debridement Wound #5 Right Gluteus o Topical Lidocaine 4% cream applied to wound bed prior to debridement Wound #6 Medial Sacrum o Topical Lidocaine 4% cream applied to wound bed prior to debridement Primary Wound Dressing Wound #4 Left Gluteus o Prisma Ag Wound #5 Right Gluteus o Prisma Ag Wound #6 Medial Sacrum o Prisma Ag Secondary Dressing Wound #4 Left Gluteus o Boardered Foam Dressing Wound #5 Right Gluteus o Boardered Foam Dressing Wound #6 Medial Sacrum o Boardered Foam Dressing Dressing Change Frequency Spieker, Auren C. ([id number]) Wound #4 Left Gluteus o Three times weekly - and as needed Wound #5 Right Gluteus o Three times weekly - and as needed Wound #6 Medial Sacrum o Three times weekly - and as needed Follow-up Appointments Wound #4 Left Gluteus o Return Appointment in 1 week. Wound #5 Right Gluteus o Return Appointment in 1 week. Wound #6 Medial Sacrum o Return Appointment in 1 week. Off-Loading Wound #4 Left Gluteus o Turn and reposition every 2 hours Wound #5 Right Gluteus o Turn and reposition every 2 hours Wound #6 Medial Sacrum o Turn and reposition every 2 hours Additional Orders / Instructions Wound #4 Left Gluteus o Increase protein  intake. Wound #5 Right Gluteus o Increase protein intake. Wound #6 Medial Sacrum o Increase protein intake. Medications-please add to medication list. Wound #4 Left Gluteus o Other: - Vitamins A, C and Zinc Wound #5 Right Gluteus o Other: - Vitamins A, C and Zinc Wound #6 Medial Sacrum o Other: - Vitamins A, C and Zinc Electronic Signature(s) Signed: 01/15/2017 4:36:56 PM By: Renne CriglerFlinchum, Cheryl Signed: 01/16/2017 8:07:00 AM By: Baltazar Najjarobson, Michael MD Entered By: Renne CriglerFlinchum, Cheryl on 01/15/2017 11:14:50 Kinoshita,  [name] ([id number]) [name], Thomas C. ([id number]) -------------------------------------------------------------------------------- Problem List Details Patient Name: [name] BrasHATCH, Grayson C. Date of Service: 01/15/2017 11:00 AM Medical Record Number: [id number] Patient Account Number: [id number] Date of Birth/Sex: [birth year] 6(66 y.o. Male) Treating RN: Renne CriglerFlinchum, Cheryl Primary Care Provider: Elizabeth SauerJones, Deanna Other Clinician: Referring Provider: Elizabeth SauerJones, Deanna Treating Provider/Extender: Maxwell CaulOBSON, MICHAEL G Weeks in Treatment: 8651 Active Problems ICD-10 Encounter Code Description Active Date Diagnosis 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, stage 3 01/24/2016 Yes S14.103S Unspecified injury at C3 level of cervical spinal cord, sequela 01/24/2016 Yes S51.011A Laceration without foreign body of right elbow, initial encounter 07/03/2016 Yes L89.322 Pressure ulcer of left buttock, stage 2 08/14/2016 Yes L89.310 Pressure ulcer of right buttock, unstageable 09/18/2016 Yes L03.317 Cellulitis of buttock 09/18/2016 Yes Inactive Problems Resolved Problems ICD-10 Code Description Active Date Resolved Date L89.622 Pressure ulcer of left heel, stage 2 06/26/2016 06/26/2016 Electronic Signature(s) Signed: 01/16/2017 8:07:00 AM By: Baltazar Najjarobson, Michael MD Entered By: Baltazar Najjarobson, Michael on 01/15/2017 12:25:14 Rheaume, [name] ([id number]) -------------------------------------------------------------------------------- Progress Note Details Patient Name: [name] BrasHATCH, Caydan C. Date of Service: 01/15/2017 11:00 AM Medical Record Number: [id number] Patient Account Number: [id number] Date of Birth/Sex: [birth year] [age] y.o. 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 He has lower extremity sensation and can move his legs but has no/limited control over his arms. His wife accompanies him today and states that in the late spring or early summer of 2017 the patient became very depressed. He refused the refused to mobilize and he developed several pressure ulcers on his back. Most of these have healed however they have a recalcitrant area over the right scapula. They've been using Santyl on this for at least the last month. In terms of depression the patient is doing better now on an antidepressant. He saw his primary physician on 01/12/16 at which time there was apparently green drainage coming out of this area [Dr. Deana Jones]. Dr. Yetta BarreJones works in the [facility]. He has completed this Septra. Otherwise looking through [facility] notes that he has a history of seizures. He also had a stroke in 2008 he follows with neurology. He also has type 2 diabetes on Glucophage, hyperlipidemia and gastroesophageal reflux. He takes Plavix for stroke prevention and Keppra for seizure prophylaxis. 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 02/08/16; this is a patient with a pressure ulcer over the right scapula. His wife has been doing the dressing with Santyl and border foam change every second day. When he arrived here 2 weeks ago we did a fairly extensive mechanical debridement. We are asked medical modalities to go out to the home and see what  they might be eligible for in terms of pressure-relief surfaces [level 2] but the wife states that they have not heard from them. 03/20/15; this is a patient we haven't seen in 5-6 weeks. This was largely due to transportation issues. They've been using Santyl and border foam changing every second day for a pressure injury over the right scapula. The patient has a  C3-C4 spinal injury. We had also asked for a review by the people who supplied DME to look at his wheelchair cushion, mattress etc. I don't know that this ever happened. In the meantime the wound has done remarkably well current measurements 1.8 x 2 x 0.1 04/03/16; the patient's dimensions have gone up to 3 cm in diameter quite a deterioration from last time. He is also complaining of pain in this area which is new. 04/10/16; 3 x 1.5 x 0.1. No difference from last week. Culture I did last week was negative he has completed antibiotics. 04/24/16 2.4 x 2 x 0.1; wound generally looks smaller. Middle area that I had to debrided last time looks healthier. His son is fashioned a large piece of foam cut out where the patient's wound with hit the back of his wheelchair 05/01/16; wound is a same size however the surface of this looks better. The middle innkeeper area required a repeat debridement 05/15/16; wound is down and dimensions granulation still looks healthy. The medial aspect of this wound is now the deeper Divot. We'll see how this responds to further healing. We're using Hydrofera Blue 06/05/16; Wound 1.7x1.1x0.1 still using hyudrofera blue 06/26/16; the patient arrives back in clinic after a three-week hiatus. He was hospitalized from 06/09/16 through 06/10/16. He was found to be confused. CT scan of the head showed nothing really acute. His sodium was low at 131. He was rehydrated. He was felt to have a UTI and given antibiotics and antibiotics at discharge although his final culture result only showed multiple organisms. His wife says today that at the time of the hospitalization they discovered a large intact blister over the large aspect of his left calcaneus. This is recently ruptured. The wound on his right scapula is somewhat larger. More his wife is concerned about his current status. She states that he is still confused sleeps for long periods. He is not having fever chills cough or diarrhea [1  loose bowel movement per day]. He continues to have a suprapubic catheter. She notes that he is not eating and drinking well. Patient states he just does not want to eat. He does not feel nauseated or vomit. 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 Admission sodium was 131 at discharge 139 BUN 22 and 1.22 at admission, 17 and 1 at discharge. 07/03/16; patient's mental status is back to normal. He has a new wound on the right elbow caused by traumatizing his elbow against the wall apparently at the dermatologist office last week. We continue with the original wound on the right scapula and Faye, Hrishikesh C. ([id number]) the wound from 2 weeks ago on his left heel. We have been using silver alginate to the area on the heel. 08/14/16; patient has not been here in almost 6 weeks. When he was here last time he had the pressure ulcer on the right scapula, atraumatic wound on the right lateral elbow and an area on his left heel. His wife states that at one point all of these were healed  and she didn't really feel he needed to come back here. Since then the patient has developed a reopening of the area on the right scapula, a stage II wound on the left buttock and a reopening of the area on the right lateral elbow. As usual his wife as a litany of complaints against home health, she is dismissing or is going to dismiss well care. She tells [place] she has a long list of supplies at home already for some reason they were not felt to be eligible for a group 2 or 3 surface although they have a hospital bed at home but no offloading surface 08/28/16; the patient arrived today unfortunately incontinent of stool in spite of this the wounds all appear to be better including the right scapular area, his left buttock's left heel and the right lateral elbow. 09/18/16; this is a patient who arrived today for follow-up of a pressure area over  his right scapula area, left buttock and there was an area on his right lateral elbow. He arrived in clinic today with a worrisome area over the right initial tuberosity/right buttock. This had a necrotic surface with draining purulence. He has not been systemically unwell. The area over the left elbow healed. He arrived in clinic today with dressing that hadn't been changed over the scapula for 2 or 3 days per her intake. His wife is previously fired home health 09/25/16;; the last time the patient was here he arrived with a new grossly infected wound over the right ischial tuberosity. Culture I did not show a specific pathogen. I did think this required admission to hospital and he was admitted from 09/18/16 through 09/20/16. Initially given vanc and Zosyn but then discharged with 5 days' worth of Augmentin. An MRI was done that did not show osteomyelitis of the right ischial tuberosity however it did show cellulitis and possible myositis. He also has wounds on the left ischial tuberosity and the area over the right scapular area. 10/04/16 on evaluation today patient appears to be doing better in regard to his back wound and is stable in regard to the gluteal wounds. There does not appear to be any evidence of significant infection at this point. He is tolerating the dressing changes currently. His wife has been performing the dressing changes. Nonetheless he does have some discomfort especially in the gluteal areas although he did not specifically rate the discomfort there were times during evaluation where he flinched and it was obvious it was bothering him. No fevers, chills, nausea, or vomiting noted at this time. 10/17/16; the patient has bilateral ischial tuberosity wounds right greater than left. The left seems to be making good progress wears the right has not really changed that much and dimensions. There is also some threatened area on the right side around the wound circumference. Equally  worrisome there is a DTI over the lower sacral area however this is not open as of yet. His area over the left scapula which is the chronic wound he was coming to the clinic continues to make excellent progress 8/28 The patient comes in today wanting to talk about getting back in his wheelchair. He is very bored lying in bed at home 11/14/16; three-week hiatus for this patient for medical illnesses whether etc. He was in the emergency room at [facility] [facility] 2 days ago when his wife noted odor coming out of the right initial tuberosity wound and discoloration. I reviewed his ER presentation from 11/12/16. White count was 11.7 differential count reasonably  normal basic metabolic panel was normal. Serum lactate was 1.9. He has a suprapubic catheter which showed positive urine nitrate large leukocytes and many bacteria. Urine cultures come back showing multiple species. Blood culture was negative. Wound culture is still pending. He was put on Keflex and Bactrim. The patient had an MRI of this area on 09/18/16 that did not show osteomyelitis however at that time there was possible cellulitis and infectious myositis no drainable fluid collection. After this he wishes admitted the hospital had vancomycin and Zosyn for 5 days and then 5 days of Augmentin 11/20/16; culture of the wound that I did last week showed multiple organisms. This was the same as from the emergency room as it turns out. He is still on Keflex and Bactrim and completing this. I was really expecting something a little more ominous although he seems to have stabilized. We have been using silver alginate to the major wound on the right ischial tuberosity. He had a DTI over the right greater trochanter, superficial wound over the left ischial tuberosity and a DTI on the superior part of the left hemipelvis. FORTUNATELY everything looks a little better here than last week. At my suggestion his wife looked into [facility] but was told  that he needed a 3 day acute hospital stay. This would be similar to what is required for skilled nursing and I was not specifically aware of this although it may have something to do with the patient's specific insurance 11/27/16; he has completed his antibiotics. All of his wounds appeared to have stabilized. This includes the necrotic right ischial tuberosity wound, DTI on the superior pelvis, DTI over the left greater trochanter. We have been using Aquacel Ag to all wound areas. He arrives in clinic today with a low temperature at first not registering at all then 91.5. After serial checks with different instruments we got this up to 94 although the patient looks fine is mentating normally. His wife reports that this is often the pattern when he goes to the hospital and that they'll put him in a heating blanket and send him home although I don't really see evidence of this on the most recent ER trips. She states she has a warming blanket and a rectal for mom under the [facility], Delores C. ([id number]) registers down to 91. I've asked her to check his temperature when she goes home. They both seemed very reluctant to go to the emergency department over this and to be truthful he looks stable enough to avoid this at least for now 12/03/16; patient arrives with all of his wounds and a considerably better situation. His wife states she has been changing him and turning him. The major remaining wound is in the right ischial tuberosity over even it looks well granulated and the copious amounts of necrotic tissue that I took out several weeks ago with coexistent infection looks to be resolved. We have been using silver alginate to all wounds 12/18/16; patient arrives for his two-week follow-up as usual accompanied by his wife. Unfortunately things have not gone well. They tell me that as of Friday evening he started to complain of severe pain in the lower buttock area presumably around the wounds although  I'm not completely certain of that. This is different for this patient to rarely complains of any pain. They also noted significant odor to the wounds. We have been using silver alginate. He has not had any falls. No fever [ or hypothermia]. He was at his  primary doctor in [place] today and had lab work done. 12/26/16; patient's deep tissue culture from last week grew Proteus and MRSA. He started Septra DS and Augmentin late 2 days ago. He is not spontaneously complaining of as much pain as last week although the patient's wife states when she changes the dressing he complains of pain. He apparently refused to go and have x-rays of the pelvis done to look at the right ischial tuberosity specifically. We have been using silver alginate largely because of the necrotic wound in the lower part of the right ischial tuberosity. He has not been systemically unwell 01/02/17; patient's pelvic x-ray did not show underlying bony destruction. He still has considerable amount of necrotic debris in the lower part of his right ischial tuberosity. The lower sacral wound and left ischial tuberosity also have very necrotic surfaces. He has been on Septra and Augmentin purulent drainage out of the right ischial tuberosity and they're completing 2 weeks of therapy which I would like to extend for another 2 weeks. He would not be an easy candidate for an MRI of the right pelvis although this may become necessary. ROS; a 10 point review of systems is negative. Specifically no diarrhea fever or chills 01/09/17; continues on antibiotics for the purulent drainage they came out of the right initial tuberosity wound growing Proteus and MRSA. He should have enough for another week. I have not done more advanced imaging however his plain x-ray did not show bony destruction. We have been using silver alginate. Arrives in today with aggressive debridements of all 3 wounds. I am less worried about soft tissue infection on the  right 01/15/17; he is completing his antibiotics for Proteus and MRSA growing out of the right ischial tuberosity this appears to be stable. Imaging here did not show osteomyelitis although I have not done an MRI. We use Medihoney last week not a lot of real difference. Still extensive necrotic debris. Unfortunately she does not have an out-of-pocket max and Santyl cost her $260 per tube in the past, this is beyond her means. Objective Constitutional Vitals Time Taken: 10:37 AM, Height: 70 in, Weight: 187 lbs, BMI: 26.8, Pulse: 88 bpm, Respiratory Rate: 16 breaths/min, Blood Pressure: 122/63 mmHg. Integumentary (Hair, Skin) Wound #4 status is Open. Original cause of wound was Pressure Injury. The wound is located on the Left Gluteus. The wound measures 2.6cm length x 2.5cm width x 0.6cm depth; 5.105cm^2 area and 3.063cm^3 volume. There is Fat Layer (Subcutaneous Tissue) Exposed exposed. There is no tunneling or undermining noted. There is a medium amount of serosanguineous drainage noted. The wound margin is flat and intact. There is no granulation within the wound bed. There is a large (67-100%) amount of necrotic tissue within the wound bed including Eschar and Adherent Slough. The periwound skin appearance exhibited: Ecchymosis. The periwound skin appearance did not exhibit: Callus, Crepitus, Excoriation, Induration, Rash, Scarring, Dry/Scaly, Maceration, Atrophie Blanche, Cyanosis, Hemosiderin Staining, Mottled, Pallor, Rubor, Erythema. Wound #5 status is Open. Original cause of wound was Pressure Injury. The wound is located on the Right Gluteus. The wound measures 4.3cm length x 4.7cm width x 1.4cm depth; 15.873cm^2 area and 22.222cm^3 volume. There is muscle and Fat Layer (Subcutaneous Tissue) Exposed exposed. There is undermining starting at 6:00 and ending at 12:00 with a Sharron, Ripley C. ([id number]) maximum distance of 2.2cm. There is a large amount of serous drainage noted. Foul odor  after cleansing was noted. The wound margin is flat and intact. There is medium (  34-66%) red granulation within the wound bed. There is a medium (34- 66%) amount of necrotic tissue within the wound bed including Eschar and Adherent Slough. The periwound skin appearance did not exhibit: Callus, Crepitus, Excoriation, Induration, Rash, Scarring, Dry/Scaly, Maceration, Atrophie Blanche, Cyanosis, Ecchymosis, Hemosiderin Staining, Mottled, Pallor, Rubor, Erythema. Periwound temperature was noted as No Abnormality. The periwound has tenderness on palpation. Wound #6 status is Open. Original cause of wound was Pressure Injury. The wound is located on the Medial Sacrum. The wound measures 2.2cm length x 4.3cm width x 1cm depth; 7.43cm^2 area and 7.43cm^3 volume. There is Fat Layer (Subcutaneous Tissue) Exposed exposed. There is no tunneling or undermining noted. There is a large amount of serosanguineous drainage noted. The wound margin is flat and intact. There is no granulation within the wound bed. There is a large (67-100%) amount of necrotic tissue within the wound bed including Eschar and Adherent Slough. The periwound skin appearance exhibited: Ecchymosis. 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 There was a Skin/Subcutaneous Tissue/Muscle Debridement ([id number]) debridement with total area of 6.5 sq cm performed by Maxwell CaulOBSON, MICHAEL G, MD. with the following instrument(s): Curette to remove Viable and Non-Viable tissue/material including Fibrin/Slough, Muscle,  Eschar, and Subcutaneous after achieving pain control using Other (lidocaine 4%). A time out was conducted at 11:00, prior to the start of the procedure. A Moderate amount of bleeding was controlled with Pressure. The procedure was tolerated well with a pain level of 5 throughout and a pain level of 0 following the procedure. Post Debridement Measurements: 2.6cm length x 2.5cm width x 0.6cm depth; 3.063cm^3 volume. Post debridement Stage noted as Unstageable/Unclassified. Character of Wound/Ulcer Post Debridement is stable. Post procedure Diagnosis Wound #4: Same as Pre-Procedure Wound #5 Pre-procedure diagnosis of Wound #5 is a Pressure Ulcer located on the Right Gluteus . There was a Skin/Subcutaneous Tissue/Muscle Debridement ([id number]) debridement with total area of 5.04 sq cm performed by Maxwell CaulOBSON, MICHAEL G, MD. with the following instrument(s): Curette to remove Viable and Non-Viable tissue/material including Fibrin/Slough, Muscle, Eschar, and Subcutaneous after achieving pain control using Other (lidocaine 4%). A time out was conducted at 11:00, prior to the start of the procedure. A Moderate amount of bleeding was controlled with Pressure. The procedure was tolerated well with a pain level of 5 throughout and a pain level of 0 following the procedure. Post Debridement Measurements: 4.3cm length x 4.7cm width x 1.4cm depth; 22.222cm^3 volume. Post debridement Stage noted as Category/Stage IV. [name], Elad C. ([id number]) Character of Wound/Ulcer Post Debridement is stable. Post procedure Diagnosis Wound #5: Same as Pre-Procedure Wound #6 Pre-procedure diagnosis of Wound #6 is a Pressure Ulcer located on the Medial Sacrum . There was a Skin/Subcutaneous Tissue/Muscle Debridement ([id number]) debridement with total area of 9.46 sq cm performed by Maxwell CaulOBSON, MICHAEL G, MD. with the following instrument(s): Curette to remove Viable and Non-Viable tissue/material including  Fibrin/Slough, Muscle, Eschar, and Subcutaneous after achieving pain control using Other (lidocaine 4%). A time out was conducted at 11:00, prior to the start of the procedure. A Moderate amount of bleeding was controlled with Pressure. The procedure was  tolerated well with a pain level of 5 throughout and a pain level of 0 following the procedure. Post Debridement Measurements: 2.2cm length x 4.3cm width x 1cm depth; 7.43cm^3 volume. Post debridement Stage noted as Category/Stage III. Character of Wound/Ulcer Post Debridement is stable. Post procedure Diagnosis Wound #6: Same as Pre-Procedure Plan Wound Cleansing: Wound #4 Left Gluteus: Clean wound with Normal Saline. Cleanse wound with mild soap and water Wound #5 Right Gluteus: Clean wound with Normal Saline. Cleanse wound with mild soap and water Wound #6 Medial Sacrum: Clean wound with Normal Saline. Cleanse wound with mild soap and water Anesthetic: Wound #4 Left Gluteus: Topical Lidocaine 4% cream applied to wound bed prior to debridement Wound #5 Right Gluteus: Topical Lidocaine 4% cream applied to wound bed prior to debridement Wound #6 Medial Sacrum: Topical Lidocaine 4% cream applied to wound bed prior to debridement Primary Wound Dressing: Wound #4 Left Gluteus: Prisma Ag Wound #5 Right Gluteus: Prisma Ag Wound #6 Medial Sacrum: Prisma Ag Secondary Dressing: Wound #4 Left Gluteus: Boardered Foam Dressing Wound #5 Right Gluteus: Boardered Foam Dressing Wound #6 Medial Sacrum: Boardered Foam Dressing Dressing Change Frequency: Wound #4 Left Gluteus: Three times weekly - and as needed Wound #5 Right Gluteus: Three times weekly - and as needed DANIS, PEMBLETON. ([id number]) Wound #6 Medial Sacrum: Three times weekly - and as needed Follow-up Appointments: Wound #4 Left Gluteus: Return Appointment in 1 week. Wound #5 Right Gluteus: Return Appointment in 1 week. Wound #6 Medial Sacrum: Return Appointment in  1 week. Off-Loading: Wound #4 Left Gluteus: Turn and reposition every 2 hours Wound #5 Right Gluteus: Turn and reposition every 2 hours Wound #6 Medial Sacrum: Turn and reposition every 2 hours Additional Orders / Instructions: Wound #4 Left Gluteus: Increase protein intake. Wound #5 Right Gluteus: Increase protein intake. Wound #6 Medial Sacrum: Increase protein intake. Medications-please add to medication list.: Wound #4 Left Gluteus: Other: - Vitamins A, C and Zinc Wound #5 Right Gluteus: Other: - Vitamins A, C and Zinc Wound #6 Medial Sacrum: Other: - Vitamins A, C and Zinc #1 the patient's original wound over the right scapula remains closed #2 still 3 necrotic wounds over both ischial tuberosities in the sacrum. This is still going to require debridement both mechanically and with topical dressings. Unfortunately we don't have Santyl is an option. The infection angle looks to be stable therefore I think antibiotics can complete.I have not pursued an MRI #3 we have counseled pressure off these wounds. #4 change to Silver collagen moist gauze Electronic Signature(s) Signed: 01/16/2017 8:07:00 AM By: Baltazar Najjar MD Entered By: Baltazar Najjar on 01/15/2017 12:30:16 [place], [name] ([id number]) -------------------------------------------------------------------------------- SuperBill Details Patient Name: [name] Bras C. Date of Service: 01/15/2017 Medical Record Number: [id number] Patient Account Number: [id number] Date of Birth/Sex: 23-Jun-1950 (66 y.o. Male) Treating RN: Renne Crigler Primary Care Provider: Elizabeth Sauer Other Clinician: Referring Provider: Elizabeth Sauer Treating Provider/Extender: Maxwell Caul Weeks in Treatment: 35 Diagnosis Coding ICD-10 Codes Code Description [phone] Pressure ulcer of other site, stage 3 S14.103S Unspecified injury at C3 level of cervical spinal cord, sequela S51.011A Laceration without foreign body of right elbow,  initial encounter L89.322 Pressure ulcer of left buttock, stage 2 L89.310 Pressure ulcer of right buttock, unstageable L03.317 Cellulitis of buttock Facility Procedures CPT4 Code: [id number] Description: 11043 - DEB MUSC/FASCIA 20 SQ CM/< ICD-10 Diagnosis Description 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, stage 3 L89.310 Pressure ulcer of right buttock, unstageable Modifier: Quantity: 1 CPT4 Code: [id number] Description:  11046 - DEB MUSC/FASCIA EA ADDL 20 CM ICD-10 Diagnosis Description 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, stage 3 L89.322 Pressure ulcer of left buttock, stage 2 L89.310 Pressure ulcer of right buttock, unstageable Modifier: Quantity: 1 Physician Procedures CPT4 Code: 4098119 Description: 11043 - WC PHYS DEBR MUSCLE/FASCIA 20 SQ CM ICD-10 Diagnosis Description 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, stage 3 L89.310 Pressure ulcer of right buttock, unstageable Modifier: Quantity: 1 CPT4 Code: 1478295 Description: 11046 - WC PHYS DEB MUSC/FASC EA ADDL 20 CM ICD-10 Diagnosis Description 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

## 2017-01-18 DIAGNOSIS — L89323 Pressure ulcer of left buttock, stage 3: Secondary | ICD-10-CM | POA: Diagnosis not present

## 2017-01-18 DIAGNOSIS — G8250 Quadriplegia, unspecified: Secondary | ICD-10-CM | POA: Diagnosis not present

## 2017-01-23 ENCOUNTER — Encounter: Payer: Medicare Other | Admitting: Internal Medicine

## 2017-01-23 ENCOUNTER — Other Ambulatory Visit: Payer: Self-pay | Admitting: Internal Medicine

## 2017-01-23 DIAGNOSIS — Z8673 Personal history of transient ischemic attack (TIA), and cerebral infarction without residual deficits: Secondary | ICD-10-CM | POA: Diagnosis not present

## 2017-01-23 DIAGNOSIS — L89322 Pressure ulcer of left buttock, stage 2: Secondary | ICD-10-CM | POA: Diagnosis not present

## 2017-01-23 DIAGNOSIS — G8252 Quadriplegia, C1-C4 incomplete: Secondary | ICD-10-CM | POA: Diagnosis not present

## 2017-01-23 DIAGNOSIS — L89153 Pressure ulcer of sacral region, stage 3: Secondary | ICD-10-CM | POA: Diagnosis not present

## 2017-01-23 DIAGNOSIS — I959 Hypotension, unspecified: Secondary | ICD-10-CM | POA: Diagnosis not present

## 2017-01-23 DIAGNOSIS — E119 Type 2 diabetes mellitus without complications: Secondary | ICD-10-CM | POA: Diagnosis not present

## 2017-01-23 DIAGNOSIS — L89310 Pressure ulcer of right buttock, unstageable: Secondary | ICD-10-CM | POA: Diagnosis not present

## 2017-01-23 DIAGNOSIS — M869 Osteomyelitis, unspecified: Secondary | ICD-10-CM | POA: Diagnosis not present

## 2017-01-23 DIAGNOSIS — R569 Unspecified convulsions: Secondary | ICD-10-CM | POA: Diagnosis not present

## 2017-01-23 DIAGNOSIS — L89314 Pressure ulcer of right buttock, stage 4: Secondary | ICD-10-CM | POA: Diagnosis not present

## 2017-01-23 DIAGNOSIS — L89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, stage 3: Secondary | ICD-10-CM | POA: Diagnosis not present

## 2017-01-23 DIAGNOSIS — S14103S Unspecified injury at C3 level of cervical spinal cord, sequela: Secondary | ICD-10-CM | POA: Diagnosis not present

## 2017-01-23 DIAGNOSIS — L89323 Pressure ulcer of left buttock, stage 3: Secondary | ICD-10-CM | POA: Diagnosis not present

## 2017-01-23 DIAGNOSIS — K219 Gastro-esophageal reflux disease without esophagitis: Secondary | ICD-10-CM | POA: Diagnosis not present

## 2017-01-23 DIAGNOSIS — Z7984 Long term (current) use of oral hypoglycemic drugs: Secondary | ICD-10-CM | POA: Diagnosis not present

## 2017-01-23 DIAGNOSIS — S51011A Laceration without foreign body of right elbow, initial encounter: Secondary | ICD-10-CM | POA: Diagnosis not present

## 2017-01-23 DIAGNOSIS — L03317 Cellulitis of buttock: Secondary | ICD-10-CM | POA: Diagnosis not present

## 2017-01-24 ENCOUNTER — Other Ambulatory Visit: Payer: Self-pay | Admitting: Internal Medicine

## 2017-01-24 ENCOUNTER — Other Ambulatory Visit
Admission: RE | Admit: 2017-01-24 | Discharge: 2017-01-24 | Disposition: A | Discharge status: Home / Self Care | Payer: Medicare Other | Source: Ambulatory Visit | Attending: Internal Medicine | Admitting: Internal Medicine

## 2017-01-24 DIAGNOSIS — B998 Other infectious disease: Secondary | ICD-10-CM | POA: Insufficient documentation

## 2017-01-24 DIAGNOSIS — L89322 Pressure ulcer of left buttock, stage 2: Secondary | ICD-10-CM

## 2017-01-24 DIAGNOSIS — L89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, stage 3: Secondary | ICD-10-CM

## 2017-01-24 NOTE — Progress Notes (Addendum)
[name], [name] ([id number]) Visit Report for 01/23/2017 Arrival Information Details Patient Name: [name], [name] Date of Service: 01/23/2017 3:30 PM Medical Record Number: [id number] Patient Account Number: [id number] Date of Birth/Sex: 1950-12-16 42(66 y.o. Male) Treating RN: [name] Primary Care [name]: [name] Other Clinician: Referring [name]: [name] Treating [name]/Extender: [name], [name] G Weeks in Treatment: 1952 Visit Information History Since Last Visit Added or deleted any medications: No Patient Arrived: Wheel Chair Any new allergies or adverse reactions: No Arrival Time: 15:31 Had a fall or experienced change in No Accompanied By: wife, activities of daily living that may affect Rhonda risk of falls: Transfer Assistance: Nurse, adultHoyer Lift Signs or symptoms of abuse/neglect since last visito No Patient Identification Verified: Yes Has Dressing in Place as Prescribed: Yes Secondary Verification Process Completed: Yes Pain Present Now: Yes Patient Requires Transmission-Based No Precautions: Patient Has Alerts: Yes Electronic Signature(s) Signed: 01/23/2017 5:35:02 PM By: [name] GurneyWoody, BSN, RN, CWS, Kim RN, BSN Entered By: [name] GurneyWoody, BSN, RN, CWS, [name] on 01/23/2017 15:32:16 Boucher, [name] BienICHARD C. ([id number]) -------------------------------------------------------------------------------- Encounter Discharge Information Details Patient Name: [name], [name] Date of Service: 01/23/2017 3:30 PM Medical Record Number: [id number] Patient Account Number: [id number] Date of Birth/Sex: 1950-12-16 100(66 y.o. Male) Treating RN: [name] Primary Care [name]: [name] Other Clinician: Referring [name]: [name] Treating Maciah Feeback/Extender: [name], [name] G Weeks in Treatment: 5252 Encounter Discharge Information Items Discharge Pain Level: 3 Discharge Condition: Stable Ambulatory Status: Wheelchair Discharge Destination:  Home Private Transportation: Auto Accompanied By: wife Schedule Follow-up Appointment: Yes Medication Reconciliation completed and provided Yes to Patient/Care [name]: Clinical Summary of Care: Electronic Signature(s) Signed: 01/23/2017 5:35:02 PM By: [name] GurneyWoody, BSN, RN, CWS, Kim RN, BSN Entered By: [name] GurneyWoody, BSN, RN, CWS, [name] on 01/23/2017 16:44:49 Mella, [name] BienICHARD C. ([id number]) -------------------------------------------------------------------------------- Lower Extremity Assessment Details Patient Name: [name], [name] Date of Service: 01/23/2017 3:30 PM Medical Record Number: [id number] Patient Account Number: [id number] Date of Birth/Sex: 1950-12-16 27(66 y.o. Male) Treating RN: [name] Primary Care Mattye Verdone: [name] Other Clinician: Referring Oluwadamilare Tobler: [name] Treating Skyah Hannon/Extender: [name], [name] G Weeks in Treatment: 6352 Electronic Signature(s) Signed: 01/23/2017 5:35:02 PM By: [name] GurneyWoody, BSN, RN, CWS, Kim RN, BSN Entered By: [name] GurneyWoody, BSN, RN, CWS, [name] on 01/23/2017 15:48:15 Siegel, [name] BienICHARD C. ([id number]) -------------------------------------------------------------------------------- Multi Wound Chart Details Patient Name: [name], [name] Date of Service: 01/23/2017 3:30 PM Medical Record Number: [id number] Patient Account Number: [id number] Date of Birth/Sex: 1950-12-16 19(66 y.o. Male) Treating RN: [name] Primary Care Damain Broadus: [name] Other Clinician: Referring [name]: [name] Treating Prisila Dlouhy/Extender: [name] CaulOBSON, [name] G Weeks in Treatment: 9052 Vital Signs Height(in): 70 Pulse(bpm): 85 Weight(lbs): 187 Blood Pressure(mmHg): 92/57 Body Mass Index(BMI): 27 Temperature(F): 97.5 Respiratory Rate 16 (breaths/min): Photos: [4:No Photos] [5:No Photos] [6:No Photos] Wound Location: [4:Left Gluteus] [5:Right Gluteus] [6:Medial Sacrum] Wounding Event: [4:Pressure Injury] [5:Pressure Injury] [6:Pressure Injury] Primary Etiology:  [4:Pressure Ulcer] [5:Pressure Ulcer] [6:Pressure Ulcer] Comorbid History: [4:N/A] [5:Coronary Artery Disease, Type N/A II Diabetes, Quadriplegia, Seizure Disorder] Date Acquired: [4:07/09/2016] [5:09/10/2016] [6:10/16/2016] Weeks of Treatment: [4:23] [5:18] [6:13] Wound Status: [4:Open] [5:Open] [6:Open] Measurements L x W x D [4:1.8x2.8x0.7] [5:4x4.8x1.2] [6:2x5x1.3] (cm) Area (cm) : [4:3.958] [5:15.08] [6:7.854] Volume (cm) : [4:2.771] [5:18.096] [6:10.21] % Reduction in Area: [4:-2421.00%] [5:-166.70%] [6:63.00%] % Reduction in Volume: [4:-17218.70%] [5:-3102.80%] [6:-381.40%] Starting Position 1 [5:7] (o'clock): Ending Position 1 [5:12] (o'clock): Maximum Distance 1 (cm): [5:1.8] Undermining: [4:N/A] [5:Yes] [6:N/A] Classification: [4:Unstageable/Unclassified] [5:Category/Stage IV] [6:Category/Stage III] Exudate Amount: [4:N/A] [5:Large] [6:N/A] Exudate Type: [4:N/A] [5:Serous] [6:N/A] Exudate Color: [4:N/A] [5:amber] [6:N/A] Foul  Odor After Cleansing: [4:N/A] [5:Yes] [6:N/A] Odor Anticipated Due to [4:N/A] [5:No] [6:N/A] Product Use: Wound Margin: [4:N/A] [5:Flat and Intact] [6:N/A] Granulation Amount: [4:N/A] [5:Medium (34-66%)] [6:N/A] Granulation Quality: [4:N/A] [5:Red] [6:N/A] Necrotic Amount: [4:N/A] [5:Medium (34-66%)] [6:N/A] Necrotic Tissue: [4:N/A] [5:Eschar, Adherent Slough] [6:N/A] Epithelialization: [4:N/A] [5:None] [6:N/A] Debridement: [4:Debridement ([id number](11042-11047) 16:00] [5:Debridement ([id number](11042-11047) 16:00] [6:Debridement ([id number](11042-11047) 16:00] Pre-procedure Verification/Time Out Taken: Pain Control: Other Other Other Tissue Debrided: Necrotic/Eschar, Necrotic/Eschar, Bone, Necrotic/Eschar, Fibrin/Slough, Subcutaneous Fibrin/Slough, Muscle, Fibrin/Slough, Subcutaneous Subcutaneous Level: Skin/Subcutaneous Tissue Skin/Subcutaneous Skin/Subcutaneous Tissue Tissue/Muscle/Bone Debridement Area (sq cm): 5.04 19.2 10 Instrument: Curette Blade, Curette, Forceps,  Curette Rongeur Specimen: None Swab, Biopsy None Number of Specimens N/A 2 N/A Taken: Bleeding: Large Large Large Hemostasis Achieved: Pressure Pressure Pressure Procedural Pain: 3 5 3  Post Procedural Pain: 3 3 3  Debridement Treatment Procedure was tolerated well Procedure was tolerated well Procedure was tolerated well Response: Post Debridement 1.8x2.8x0.7 4x4.8x1.5 2x5x1.5 Measurements L x W x D (cm) Post Debridement Volume: 2.771 22.619 11.781 (cm) Post Debridement Stage: Category/Stage III Category/Stage IV Category/Stage III Periwound Skin Texture: No Abnormalities Noted Excoriation: No No Abnormalities Noted Induration: No Callus: No Crepitus: No Rash: No Scarring: No Periwound Skin Moisture: No Abnormalities Noted Maceration: No No Abnormalities Noted Dry/Scaly: No Periwound Skin Color: No Abnormalities Noted Atrophie Blanche: No No Abnormalities Noted Cyanosis: No Ecchymosis: No Erythema: No Hemosiderin Staining: No Mottled: No Pallor: No Rubor: No Temperature: N/A No Abnormality N/A Tenderness on Palpation: No Yes No Wound Preparation: N/A Ulcer Cleansing: N/A Rinsed/Irrigated with Saline Topical Anesthetic Applied: Other: lidocaine 4% Procedures Performed: Debridement Debridement Debridement Treatment Notes Wound #4 (Left Gluteus) 1. Cleansed with: Clean wound with Normal Saline 2. Anesthetic Topical Lidocaine 4% cream to wound bed prior to debridement Bertling, Tank C. ([id number]) 4. Dressing Applied: Other dressing (specify in notes) 5. Secondary Dressing Applied Bordered Foam Dressing Notes [facility] Wound #5 (Right Gluteus) 1. Cleansed with: Clean wound with Normal Saline 2. Anesthetic Topical Lidocaine 4% cream to wound bed prior to debridement 4. Dressing Applied: Other dressing (specify in notes) 5. Secondary Dressing Applied Bordered Foam Dressing Notes [facility] Wound #6 (Medial Sacrum) 1. Cleansed with: Clean wound with  Normal Saline 2. Anesthetic Topical Lidocaine 4% cream to wound bed prior to debridement 4. Dressing Applied: Other dressing (specify in notes) 5. Secondary Dressing Applied Bordered Foam Dressing Notes [facility] Electronic Signature(s) Signed: 01/23/2017 5:42:06 PM By: Baltazar Najjarobson, Michael MD Entered By: Baltazar Najjarobson, [name] on 01/23/2017 17:30:30 Kimble, [name] BienICHARD C. ([id number]) -------------------------------------------------------------------------------- Multi-Disciplinary Care Plan Details Patient Name: [name], [name] Date of Service: 01/23/2017 3:30 PM Medical Record Number: [id number] Patient Account Number: [id number] Date of Birth/Sex: 10-Dec-1950 [age] y.o. Male) Treating RN: [name] Primary Care Porschea Borys: [name] Other Clinician: Referring Kurk Corniel: [name] Treating Otelia Hettinger/Extender: [name], [name] G Weeks in Treatment: 6252 Active Inactive Electronic Signature(s) Signed: 03/06/2017 10:27:07 AM By: [name] GurneyWoody, BSN, RN, CWS, Kim RN, BSN Previous Signature: 01/23/2017 5:35:02 PM Version By: [name] GurneyWoody, BSN, RN, CWS, Kim RN, BSN Entered By: [name] GurneyWoody, BSN, RN, CWS, [name] on 03/06/2017 10:27:06 Mahadeo, [name] BienICHARD C. ([id number]) -------------------------------------------------------------------------------- Pain Assessment Details Patient Name: [name], [name] Date of Service: 01/23/2017 3:30 PM Medical Record Number: [id number] Patient Account Number: [id number] Date of Birth/Sex: 10-Dec-1950 [age] y.o. Male) Treating RN: [name] Primary Care Brelyn Woehl: [name] Other Clinician: Referring Tequia Wolman: [name] Treating Mikya Don/Extender: [name], [name] G Weeks in Treatment: 6252 Active Problems Location of Pain Severity and Description of Pain Patient Has Paino Yes Site Locations Pain Location: Generalized Pain Rate the pain. Current Pain  Level: 4 Pain Management and Medication Current Pain Management: Electronic Signature(s) Signed: 01/23/2017 5:35:02 PM By: [name] Gurney, BSN,  RN, CWS, Kim RN, BSN Entered By: [name] Gurney, BSN, RN, CWS, [name] on 01/23/2017 15:32:34 Duhamel, [name] ([id number]) -------------------------------------------------------------------------------- Patient/Caregiver Education Details Patient Name: [name], EGNOR. Date of Service: 01/23/2017 3:30 PM Medical Record Number: [id number] Patient Account Number: [id number] Date of Birth/Gender: 09-Mar-1950 (66 y.o. Male) Treating RN: [name] Coventry Primary Care Physician: [name] Sauer Other Clinician: Referring Physician: Elizabeth Sauer Treating Physician/Extender: [name] [place] in Treatment: 70 Education Assessment Education Provided To: Patient Education Topics Provided Pressure: Handouts: Pressure Ulcers: Care and Offloading Methods: Demonstration, Explain/Verbal Responses: State content correctly Wound/Skin Impairment: Handouts: Caring for Your Ulcer Methods: Demonstration, Explain/Verbal Responses: State content correctly Electronic Signature(s) Signed: 01/23/2017 5:35:02 PM By: [name] Gurney, BSN, RN, CWS, Kim RN, BSN Entered By: [name] Gurney, BSN, RN, CWS, [name] on 01/23/2017 16:45:09 Adell, [name] ([id number]) -------------------------------------------------------------------------------- Wound Assessment Details Patient Name: [name], [name] Date of Service: 01/23/2017 3:30 PM Medical Record Number: [id number] Patient Account Number: [id number] Date of Birth/Sex: 1950/07/28 (66 y.o. Male) Treating RN: [name] Coventry Primary Care Jacia Sickman: [name] Sauer Other Clinician: Referring Paige Vanderwoude: [name] Sauer Treating Tyric Rodeheaver/Extender: [name] Weeks in Treatment: 13 Wound Status Wound Number: 4 Primary Pressure Ulcer Etiology: Wound Location: Left Gluteus Wound Open Wounding Event: Pressure Injury Status: Date Acquired: 07/09/2016 Comorbid Coronary Artery Disease, Type II Diabetes, Weeks Of Treatment: 23 History: Quadriplegia, Seizure Disorder Clustered Wound: No Wound  Measurements Length: (cm) 1.8 Width: (cm) 2.8 Depth: (cm) 0.7 Area: (cm) 3.958 Volume: (cm) 2.771 % Reduction in Area: -2421% % Reduction in Volume: -17218.7% Epithelialization: None Wound Description Classification: Category/Stage III Wound Margin: Flat and Intact Exudate Amount: Medium Exudate Type: Serosanguineous Exudate Color: red, brown Foul Odor After Cleansing: No Slough/Fibrino Yes Wound Bed Granulation Amount: None Present (0%) Exposed Structure Necrotic Amount: Large (67-100%) Fascia Exposed: No Necrotic Quality: Eschar, Adherent Slough Fat Layer (Subcutaneous Tissue) Exposed: Yes Tendon Exposed: No Muscle Exposed: No Joint Exposed: No Bone Exposed: No Periwound Skin Texture Texture Color No Abnormalities Noted: No No Abnormalities Noted: No Callus: No Atrophie Blanche: No Crepitus: No Cyanosis: No Excoriation: No Ecchymosis: Yes Induration: No Erythema: No Rash: No Hemosiderin Staining: No Scarring: No Mottled: No Pallor: No Moisture Rubor: No No Abnormalities Noted: No Dry / Scaly: No Maceration: No Hackel, Cortavius C. ([id number]) Wound Preparation Ulcer Cleansing: Rinsed/Irrigated with Saline Topical Anesthetic Applied: Other: lidocaine 4%, Electronic Signature(s) Signed: 02/07/2017 9:49:16 AM By: Renne Crigler Signed: 02/12/2017 11:38:18 AM By: [name] Gurney, BSN, RN, CWS, Kim RN, BSN Previous Signature: 01/23/2017 5:35:02 PM Version By: [name] Gurney, BSN, RN, CWS, Kim RN, BSN Entered By: Renne Crigler on 02/07/2017 09:49:15 [place]ord, [name] ([id number]) -------------------------------------------------------------------------------- Wound Assessment Details Patient Name: [name], [name] Date of Service: 01/23/2017 3:30 PM Medical Record Number: [id number] Patient Account Number: [id number] Date of Birth/Sex: [birth year] (66 y.o. Male) Treating RN: [name] Coventry Primary Care Amariyana Heacox: [name] Sauer Other Clinician: Referring Shawndale Kilpatrick: [name] Sauer Treating Lianette Broussard/Extender: [name] Weeks in Treatment: 29 Wound Status Wound Number: 5 Primary Pressure Ulcer Etiology: Wound Location: Right Gluteus Wound Open Wounding Event: Pressure Injury Status: Date Acquired: 09/10/2016 Comorbid Coronary Artery Disease, Type II Diabetes, Weeks Of Treatment: 18 History: Quadriplegia, Seizure Disorder Clustered Wound: No Wound Measurements Length: (cm) 4 Width: (cm) 4.8 Depth: (cm) 1.2 Area: (cm) 15.08 Volume: (cm) 18.096 % Reduction in Area: -166.7% % Reduction in Volume: -3102.8% Epithelialization: None Undermining: Yes Starting Position (o'clock): 7 Ending Position (o'clock): 12  Maximum Distance: (cm) 1.8 Wound Description Classification: Category/Stage IV Wound Margin: Flat and Intact Exudate Amount: Large Exudate Type: Serous Exudate Color: amber Foul Odor After Cleansing: Yes Due to Product Use: No Slough/Fibrino Yes Wound Bed Granulation Amount: Medium (34-66%) Exposed Structure Granulation Quality: Red Fascia Exposed: Yes Necrotic Amount: Medium (34-66%) Fat Layer (Subcutaneous Tissue) Exposed: Yes Necrotic Quality: Eschar, Adherent Slough Tendon Exposed: No Muscle Exposed: Yes Necrosis of Muscle: No Joint Exposed: No Bone Exposed: No Periwound Skin Texture Texture Color No Abnormalities Noted: No No Abnormalities Noted: No Callus: No Atrophie Blanche: No Crepitus: No Cyanosis: No Excoriation: No Ecchymosis: No Induration: No Erythema: No Rash: No Hemosiderin Staining: No Scarring: No Mottled: No Pallor: No Moisture Badami, Masashi C. ([id number]) No Abnormalities Noted: No Rubor: No Dry / Scaly: No Temperature / Pain Maceration: No Temperature: No Abnormality Tenderness on Palpation: Yes Wound Preparation Ulcer Cleansing: Rinsed/Irrigated with Saline Topical Anesthetic Applied: Other: lidocaine 4%, Electronic Signature(s) Signed: 01/23/2017 5:35:02 PM By: [name] Gurney, BSN, RN,  CWS, Kim RN, BSN Entered By: [name] Gurney, BSN, RN, CWS, [name] on 01/23/2017 16:35:31 [place]ham, [name] ([id number]) -------------------------------------------------------------------------------- Wound Assessment Details Patient Name: [name], [name] Date of Service: 01/23/2017 3:30 PM Medical Record Number: [id number] Patient Account Number: [id number] Date of Birth/Sex: [birth year] (66 y.o. Male) Treating RN: [name] Coventry Primary Care Netha Dafoe: [name] Sauer Other Clinician: Referring Mieshia Pepitone: [name] Sauer Treating Myrna Vonseggern/Extender: [name] Weeks in Treatment: 57 Wound Status Wound Number: 6 Primary Pressure Ulcer Etiology: Wound Location: Sacrum - Medial Wound Open Wounding Event: Pressure Injury Status: Date Acquired: 10/16/2016 Comorbid Coronary Artery Disease, Type II Diabetes, Weeks Of Treatment: 13 History: Quadriplegia, Seizure Disorder Clustered Wound: No Wound Measurements Length: (cm) 2 Width: (cm) 5 Depth: (cm) 1.3 Area: (cm) 7.854 Volume: (cm) 10.21 % Reduction in Area: 63% % Reduction in Volume: -381.4% Epithelialization: None Wound Description Classification: Category/Stage III Wound Margin: Flat and Intact Exudate Amount: Large Exudate Type: Serosanguineous Exudate Color: red, brown Foul Odor After Cleansing: No Slough/Fibrino Yes Wound Bed Granulation Amount: None Present (0%) Exposed Structure Necrotic Amount: Large (67-100%) Fascia Exposed: No Necrotic Quality: Eschar, Adherent Slough Fat Layer (Subcutaneous Tissue) Exposed: Yes Tendon Exposed: No Muscle Exposed: No Joint Exposed: No Bone Exposed: No Periwound Skin Texture Texture Color No Abnormalities Noted: No No Abnormalities Noted: No Ecchymosis: Yes Moisture No Abnormalities Noted: No Wound Preparation Topical Anesthetic Applied: Other: lidocaine 4%, Electronic Signature(s) Signed: 02/07/2017 9:52:43 AM By: Renne Crigler Signed: 02/12/2017 11:38:18 AM By: [name] Gurney, BSN,  RN, CWS, Kim RN, BSN Previous Signature: 01/23/2017 5:35:02 PM Version By: [name] Gurney BSN, RN, CWS, Kim RN, BSN [address] [place] ([id number]) Entered By: Renne Crigler on 02/07/2017 09:52:42 [place]ly, [name] ([id number]) -------------------------------------------------------------------------------- Vitals Details Patient Name: JAILYN, LANGHORST. Date of Service: 01/23/2017 3:30 PM Medical Record Number: [id number] Patient Account Number: [id number] Date of Birth/Sex: [birth year] (66 y.o. Male) Treating RN: [name] Coventry Primary Care Odas Ozer: [name] Sauer Other Clinician: Referring Jeydi Klingel: [name] Sauer Treating Izacc Demeyer/Extender: [name] Weeks in Treatment: 7 Vital Signs Time Taken: 15:32 Temperature (F): 97.5 Height (in): 70 Pulse (bpm): 85 Weight (lbs): 187 Respiratory Rate (breaths/min): 16 Body Mass Index (BMI): 26.8 Blood Pressure (mmHg): 92/57 Reference Range: 80 - 120 mg / dl Electronic Signature(s) Signed: 01/23/2017 5:35:02 PM By: [name] Gurney, BSN, RN, CWS, Kim RN, BSN Entered By: [name] Gurney, BSN, RN, CWS, [name] on 01/23/2017 15:33:02

## 2017-01-25 LAB — SURGICAL PATHOLOGY

## 2017-01-25 NOTE — Progress Notes (Signed)
LORAIN, KEAST ([id number]) Visit Report for 01/23/2017 Debridement Details Patient Name: [name], [name]. Date of Service: 01/23/2017 3:30 PM Medical Record Number: [id number] Patient Account Number: [id number] Date of Birth/Sex: [birth year] (66 y.o. Male) Treating RN: Renne Crigler Primary Care Provider: Elizabeth Sauer Other Clinician: Referring Provider: Elizabeth Sauer Treating Provider/Extender: Altamese [place] in Treatment: 82 Debridement Performed for Wound #4 Left Gluteus Assessment: Performed By: Physician Maxwell Caul, MD Debridement: Debridement Pre-procedure Verification/Time Yes - 16:00 Out Taken: Start Time: 16:00 Pain Control: Other : lidocaine 4% Level: Skin/Subcutaneous Tissue Total Area Debrided (L x W): 1.8 (cm) x 2.8 (cm) = 5.04 (cm) Tissue and other material Viable, Non-Viable, Eschar, Fibrin/Slough, Subcutaneous debrided: Instrument: Curette Bleeding: Large Hemostasis Achieved: Pressure End Time: 16:05 Procedural Pain: 3 Post Procedural Pain: 3 Response to Treatment: Procedure was tolerated well Post Debridement Measurements of Total Wound Length: (cm) 1.8 Stage: Category/Stage III Width: (cm) 2.8 Depth: (cm) 0.7 Volume: (cm) 2.771 Character of Wound/Ulcer Post Requires Further Debridement Debridement: Post Procedure Diagnosis Same as Pre-procedure Electronic Signature(s) Signed: 01/23/2017 5:42:06 PM By: Baltazar Najjar MD Signed: 01/24/2017 4:11:09 PM By: Renne Crigler Entered By: Baltazar Najjar on 01/23/2017 17:30:45 Crosley, [name] ([id number]) -------------------------------------------------------------------------------- Debridement Details Patient Name: [name] Bras C. Date of Service: 01/23/2017 3:30 PM Medical Record Number: [id number] Patient Account Number: [id number] Date of Birth/Sex: Jul 09, 1950 (66 y.o. Male) Treating RN: Renne Crigler Primary Care Provider: Elizabeth Sauer Other  Clinician: Referring Provider: Elizabeth Sauer Treating Provider/Extender: Altamese [place] in Treatment: 30 Debridement Performed for Wound #5 Right Gluteus Assessment: Performed By: Physician Maxwell Caul, MD Debridement: Debridement Pre-procedure Verification/Time Yes - 16:00 Out Taken: Start Time: 16:00 Pain Control: Other : lidocaine 4% Level: Skin/Subcutaneous Tissue/Muscle/Bone Total Area Debrided (L x W): 4 (cm) x 4.8 (cm) = 19.2 (cm) Tissue and other material Viable, Non-Viable, Bone, Eschar, Fibrin/Slough, Muscle, Subcutaneous debrided: Instrument: Blade, Curette, Forceps, Rongeur Specimen: Biopsy, Swab Number of Specimens Taken: 2 Bleeding: Large Hemostasis Achieved: Pressure End Time: 16:05 Procedural Pain: 5 Post Procedural Pain: 3 Response to Treatment: Procedure was tolerated well Post Debridement Measurements of Total Wound Length: (cm) 4 Stage: Category/Stage IV Width: (cm) 4.8 Depth: (cm) 1.5 Volume: (cm) 22.619 Character of Wound/Ulcer Post Requires Further Debridement Debridement: Post Procedure Diagnosis Same as Pre-procedure Electronic Signature(s) Signed: 01/23/2017 5:42:06 PM By: Baltazar Najjar MD Signed: 01/24/2017 4:11:09 PM By: Renne Crigler Entered By: Baltazar Najjar on 01/23/2017 17:30:54 Taboada, [name] ([id number]) -------------------------------------------------------------------------------- Debridement Details Patient Name: [name] Bras C. Date of Service: 01/23/2017 3:30 PM Medical Record Number: [id number] Patient Account Number: [id number] Date of Birth/Sex: 1950-10-25 (66 y.o. Male) Treating RN: Renne Crigler Primary Care Provider: Elizabeth Sauer Other Clinician: Referring Provider: Elizabeth Sauer Treating Provider/Extender: Altamese [place] in Treatment: 7 Debridement Performed for Wound #6 Medial Sacrum Assessment: Performed By: Physician Maxwell Caul, MD Debridement:  Debridement Pre-procedure Verification/Time Yes - 16:00 Out Taken: Start Time: 16:00 Pain Control: Other : lidocaine 4% Level: Skin/Subcutaneous Tissue Total Area Debrided (L x W): 2 (cm) x 5 (cm) = 10 (cm) Tissue and other material Viable, Non-Viable, Eschar, Fibrin/Slough, Subcutaneous debrided: Instrument: Curette Bleeding: Large Hemostasis Achieved: Pressure End Time: 16:05 Procedural Pain: 3 Post Procedural Pain: 3 Response to Treatment: Procedure was tolerated well Post Debridement Measurements of Total Wound Length: (cm) 2 Stage: Category/Stage III Width: (cm) 5 Depth: (cm) 1.5 Volume: (cm) 11.781 Character of Wound/Ulcer Post Requires Further Debridement Debridement: Post Procedure Diagnosis Same as Pre-procedure Electronic Signature(s) Signed: 01/23/2017 5:42:06 PM  By: Baltazar Najjar MD Signed: 01/24/2017 4:11:09 PM By: Renne Crigler Entered By: Baltazar Najjar on 01/23/2017 17:31:09 Krist, [name] ([id number]) -------------------------------------------------------------------------------- HPI Details Patient Name: [name], [name] Date of Service: 01/23/2017 3:30 PM Medical Record Number: [id number] Patient Account Number: [id number] Date of Birth/Sex: [birth year] (66 y.o. 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 He has lower extremity sensation and can move his legs but has no/limited control over his arms. His wife accompanies him today and states that in the late spring or early summer of 2017 the patient became very depressed. He refused the refused to mobilize and he developed several pressure ulcers on his  back. Most of these have healed however they have a recalcitrant area over the right scapula. They've been using Santyl on this for at least the last month. In terms of depression the patient is doing better now on an antidepressant. He saw his primary physician on 01/12/16 at which time there was apparently green drainage coming out of this area [Dr. Deana Jones]. Dr. Yetta Barre works in the [facility]. He has completed this Septra. Otherwise looking through [facility] notes that he has a history of seizures. He also had a stroke in 2008 he follows with neurology. He also has type 2 diabetes on Glucophage, hyperlipidemia and gastroesophageal reflux. He takes Plavix for stroke prevention and Keppra for seizure prophylaxis. 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 02/08/16; this is a patient with a pressure ulcer over the right scapula. His wife has been doing the dressing with Santyl and border foam change every second day. When he arrived here 2 weeks ago we did a fairly extensive mechanical debridement. We are asked medical modalities to go out to the home and see what they might be eligible for in terms of pressure-relief surfaces [level 2] but the wife states that they have not heard from them. 03/20/15; this is a patient we haven't seen in 5-6 weeks. This was largely due to transportation issues. They've been using Santyl and border foam changing every second day for a pressure injury over the right scapula. The patient has a C3-C4 spinal injury. We had also asked for a review by the people who supplied DME to look at his wheelchair cushion, mattress etc. I don't know that this ever happened. In the meantime the wound has done remarkably well current measurements 1.8 x 2 x 0.1 04/03/16; the patient's dimensions have gone up to 3 cm in diameter quite a deterioration from last time. He is also  complaining of pain in this area which is new. 04/10/16; 3 x 1.5 x 0.1. No difference from last week. Culture I did last week was negative he has completed antibiotics. 04/24/16 2.4 x 2 x 0.1; wound generally looks smaller. Middle area that I had to debrided last time looks healthier. His son is fashioned a large piece of foam cut out where the patient's wound with hit the back of his wheelchair 05/01/16; wound is a same size however the surface of this looks better. The middle innkeeper area required a repeat debridement 05/15/16; wound is down and dimensions granulation  still looks healthy. The medial aspect of this wound is now the deeper [place]. We'll see how this responds to further healing. We're using Hydrofera Blue 06/05/16; Wound 1.7x1.1x0.1 still using hyudrofera blue 06/26/16; the patient arrives back in clinic after a three-week hiatus. He was hospitalized from 06/09/16 through 06/10/16. He was found to be confused. CT scan of the head showed nothing really acute. His sodium was low at 131. He was rehydrated. He was felt to have a UTI and given antibiotics and antibiotics at discharge although his final culture result only showed multiple organisms. His wife says today that at the time of the hospitalization they discovered a large intact blister over the large aspect of his left calcaneus. This is recently ruptured. The wound on his right scapula is somewhat larger. More his wife is concerned about his current status. She states that he is still confused sleeps for long periods. He is not having fever chills cough or diarrhea [1 loose bowel movement per day]. He continues to have a suprapubic catheter. She notes that he is not eating and drinking well. Patient states he just does not want to eat. He does not feel nauseated or vomit. 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 Admission sodium was 131 at  discharge 139 BUN 22 and 1.22 at admission, 17 and 1 at discharge. 07/03/16; patient's mental status is back to normal. He has a new wound on the right elbow caused by traumatizing his elbow against the wall apparently at the dermatologist office last week. We continue with the original wound on the right scapula and the wound from 2 weeks ago on his left heel. We have been using silver alginate to the area on the heel. [name], Pellegrino C. ([id number]) 08/14/16; patient has not been here in almost 6 weeks. When he was here last time he had the pressure ulcer on the right scapula, atraumatic wound on the right lateral elbow and an area on his left heel. His wife states that at one point all of these were healed and she didn't really feel he needed to come back here. Since then the patient has developed a reopening of the area on the right scapula, a stage II wound on the left buttock and a reopening of the area on the right lateral elbow. As usual his wife as a litany of complaints against home health, she is dismissing or is going to dismiss well care. She tells us she has a long list of supplies at home already for some reason they were not felt to be eligible for a group 2 or 3 surface although they have a hospital bed at home but no offloading surface 08/28/16; the patient arrived today unfortunately incontinent of stool in spite of this the wounds all appear to be better including the right scapular area, his left buttock's left heel and the right lateral elbow. 09/18/16; this is a patient who arrived today for follow-up of a pressure area over his right scapula area, left buttock and there was an area on his right lateral elbow. He arrived in clinic today with a worrisome area over the right initial tuberosity/right buttock. This had a necrotic surface with draining purulence. He has not been systemically unwell. The area over the left elbow healed. He arrived in clinic today with dressing that hadn't  been changed over the scapula for 2 or 3 days per her intake. His  wife is previously fired home health 09/25/16;; the last time the patient was here he arrived with a new grossly infected wound over the right ischial tuberosity. Culture I did not show a specific pathogen. I did think this required admission to hospital and he was admitted from 09/18/16 through 09/20/16. Initially given vanc and Zosyn but then discharged with 5 days' worth of Augmentin. An MRI was done that did not show osteomyelitis of the right ischial tuberosity however it did show cellulitis and possible myositis. He also has wounds on the left ischial tuberosity and the area over the right scapular area. 10/04/16 on evaluation today patient appears to be doing better in regard to his back wound and is stable in regard to the gluteal wounds. There does not appear to be any evidence of significant infection at this point. He is tolerating the dressing changes currently. His wife has been performing the dressing changes. Nonetheless he does have some discomfort especially in the gluteal areas although he did not specifically rate the discomfort there were times during evaluation where he flinched and it was obvious it was bothering him. No fevers, chills, nausea, or vomiting noted at this time. 10/17/16; the patient has bilateral ischial tuberosity wounds right greater than left. The left seems to be making good progress wears the right has not really changed that much and dimensions. There is also some threatened area on the right side around the wound circumference. Equally worrisome there is a DTI over the lower sacral area however this is not open as of yet. His area over the left scapula which is the chronic wound he was coming to the clinic continues to make excellent progress 8/28 The patient comes in today wanting to talk about getting back in his wheelchair. He is very bored lying in bed at home 11/14/16; three-week hiatus for  this patient for medical illnesses whether etc. He was in the emergency room at [facility] 2 days ago when his wife noted odor coming out of the right initial tuberosity wound and discoloration. I reviewed his ER presentation from 11/12/16. White count was 11.7 differential count reasonably normal basic metabolic panel was normal. Serum lactate was 1.9. He has a suprapubic catheter which showed positive urine nitrate large leukocytes and many bacteria. Urine cultures come back showing multiple species. Blood culture was negative. Wound culture is still pending. He was put on Keflex and Bactrim. The patient had an MRI of this area on 09/18/16 that did not show osteomyelitis however at that time there was possible cellulitis and infectious myositis no drainable fluid collection. After this he wishes admitted the hospital had vancomycin and Zosyn for 5 days and then 5 days of Augmentin 11/20/16; culture of the wound that I did last week showed multiple organisms. This was the same as from the emergency room as it turns out. He is still on Keflex and Bactrim and completing this. I was really expecting something a little more ominous although he seems to have stabilized. We have been using silver alginate to the major wound on the right ischial tuberosity. He had a DTI over the right greater trochanter, superficial wound over the left ischial tuberosity and a DTI on the superior part of the left hemipelvis. FORTUNATELY everything looks a little better here than last week. At my suggestion his wife looked into [facility] but was told that he needed a 3 day acute hospital stay. This would be similar to what is required for skilled nursing and  I was not specifically aware of this although it may have something to do with the patient's specific insurance 11/27/16; he has completed his antibiotics. All of his wounds appeared to have stabilized. This includes the necrotic right ischial tuberosity  wound, DTI on the superior pelvis, DTI over the left greater trochanter. We have been using Aquacel Ag to all wound areas. He arrives in clinic today with a low temperature at first not registering at all then 91.5. After serial checks with different instruments we got this up to 94 although the patient looks fine is mentating normally. His wife reports that this is often the pattern when he goes to the hospital and that they'll put him in a heating blanket and send him home although I don't really see evidence of this on the most recent ER trips. She states she has a warming blanket and a rectal for mom under the registers down to 91. I've asked her to check his temperature when she goes home. They both seemed very reluctant to go to [facility]. ([id number]) the emergency department over this and to be truthful he looks stable enough to avoid this at least for now 12/03/16; patient arrives with all of his wounds and a considerably better situation. His wife states she has been changing him and turning him. The major remaining wound is in the right ischial tuberosity over even it looks well granulated and the copious amounts of necrotic tissue that I took out several weeks ago with coexistent infection looks to be resolved. We have been using silver alginate to all wounds 12/18/16; patient arrives for his two-week follow-up as usual accompanied by his wife. Unfortunately things have not gone well. They tell me that as of Friday evening he started to complain of severe pain in the lower buttock area presumably around the wounds although I'm not completely certain of that. This is different for this patient to rarely complains of any pain. They also noted significant odor to the wounds. We have been using silver alginate. He has not had any falls. No fever [ or hypothermia]. He was at his primary doctor in [place] today and had lab work done. 12/26/16; patient's deep tissue culture from last week  grew Proteus and MRSA. He started Septra DS and Augmentin late 2 days ago. He is not spontaneously complaining of as much pain as last week although the patient's wife states when she changes the dressing he complains of pain. He apparently refused to go and have x-rays of the pelvis done to look at the right ischial tuberosity specifically. We have been using silver alginate largely because of the necrotic wound in the lower part of the right ischial tuberosity. He has not been systemically unwell 01/02/17; patient's pelvic x-ray did not show underlying bony destruction. He still has considerable amount of necrotic debris in the lower part of his right ischial tuberosity. The lower sacral wound and left ischial tuberosity also have very necrotic surfaces. He has been on Septra and Augmentin purulent drainage out of the right ischial tuberosity and they're completing 2 weeks of therapy which I would like to extend for another 2 weeks. He would not be an easy candidate for an MRI of the right pelvis although this may become necessary. ROS; a 10 point review of systems is negative. Specifically no diarrhea fever or chills 01/09/17; continues on antibiotics for the purulent drainage they came out of the right initial tuberosity wound growing Proteus and MRSA. He should have  enough for another week. I have not done more advanced imaging however his plain x-ray did not show bony destruction. We have been using silver alginate. Arrives in today with aggressive debridements of all 3 wounds. I am less worried about soft tissue infection on the right 01/15/17; he is completing his antibiotics for Proteus and MRSA growing out of the right ischial tuberosity this appears to be stable. Imaging here did not show osteomyelitis although I have not done an MRI. We use Medihoney last week not a lot of real difference. Still extensive necrotic debris. Unfortunately she does not have an out-of-pocket max and Santyl  cost her $260 per tube in the past, this is beyond her means. 01/23/17; he completed the antibiotics that I gave him about a week ago. Nevertheless there is apparently an odor that even the patient complains about. 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 He is not febrile but he does complain of pain in the right buttock area I think. He has cervical cord injury but still has lower extremity sensation. For some reason I changed him to silver collagen last time. I can't re-create my thought process here. They could not afford Engineer, civil (consulting)) Signed: 01/23/2017 5:42:06 PM By: Baltazar Najjar MD Entered By: Baltazar Najjar on 01/23/2017 17:33:44 Tallo, [name] ([id number]) -------------------------------------------------------------------------------- Physical Exam Details Patient Name: Chamblin, Duan C. Date of Service: 01/23/2017 3:30 PM Medical Record Number: [id number] Patient Account Number: [id number] Date of Birth/Sex: [birth year] (66 y.o. Male) Treating RN: Renne Crigler Primary Care Provider: Elizabeth Sauer Other Clinician: Referring Provider: Elizabeth Sauer Treating Provider/Extender: Maxwell Caul Weeks in Treatment: 28 Constitutional Patient is hypotensive.. Pulse regular and within target range for patient.Marland Kitchen Respirations regular, non-labored and within target range.. Temperature is normal and within the target range for the patient.Marland Kitchen appears in no distress. Notes Wound exam; the patient has 3 necrotic areas predominantly the right initial tuberosity but also on the lower sacrum and the left initial tuberosity oUsing pickups and a #15 scalpel. I remove necrotic material over the surface of the right initial tuberosity. This clearly goes down to bone. Using Rongeurs I attempted to get a piece of bone  for both pathology and culture. Not completely certain how successful I was but I certainly got deep tissue oUsing a #5 curet the sacrum and left initial tuberosity wounds were also debrided. I have not been able to get down to viable surface here. oThere is no clear surrounding soft tissue infection although there is an odor coming from the right ischial tuberosity wound Electronic Signature(s) Signed: 01/23/2017 5:42:06 PM By: Baltazar Najjar MD Entered By: Baltazar Najjar on 01/23/2017 17:35:58 Dibello, [name] ([id number]) -------------------------------------------------------------------------------- Physician Orders Details Patient Name: Perras, Kadeem C. Date of Service: 01/23/2017 3:30 PM Medical Record Number: [id number] Patient Account Number: [id number] Date of Birth/Sex: [birth year] (66 y.o. Male) Treating RN: Huel Coventry Primary Care Provider: Elizabeth Sauer Other Clinician: Referring Provider: Elizabeth Sauer Treating Provider/Extender: Altamese [place] in Treatment: 33 Verbal / Phone Orders: No Diagnosis Coding Wound Cleansing Wound #4 Left Gluteus o Clean wound with Normal Saline. o Cleanse wound with mild soap and water Wound #5 Right Gluteus o Clean wound with Normal Saline. o Cleanse wound with mild soap and water Wound #6 Medial Sacrum o Clean wound with Normal Saline. o Cleanse wound with mild soap and  water Anesthetic Wound #4 Left Gluteus o Topical Lidocaine 4% cream applied to wound bed prior to debridement Wound #5 Right Gluteus o Topical Lidocaine 4% cream applied to wound bed prior to debridement Wound #6 Medial Sacrum o Topical Lidocaine 4% cream applied to wound bed prior to debridement Primary Wound Dressing Wound #4 Left Gluteus o Other: - SIlvercell Wound #5 Right Gluteus o Other: - SIlvercell Wound #6 Medial Sacrum o Other: - SIlvercell Secondary Dressing Wound #4 Left Gluteus o Boardered Foam Dressing Wound  #5 Right Gluteus o Boardered Foam Dressing Wound #6 Medial Sacrum o Boardered Foam Dressing Dressing Change Frequency Gul, Ishaaq C. ([id number]) Wound #4 Left Gluteus o Change dressing every day. Wound #5 Right Gluteus o Change dressing every day. Wound #6 Medial Sacrum o Change dressing every day. Follow-up Appointments Wound #4 Left Gluteus o Return Appointment in 1 week. Wound #5 Right Gluteus o Return Appointment in 1 week. Wound #6 Medial Sacrum o Return Appointment in 1 week. Off-Loading Wound #4 Left Gluteus o Turn and reposition every 2 hours Wound #5 Right Gluteus o Turn and reposition every 2 hours Wound #6 Medial Sacrum o Turn and reposition every 2 hours Additional Orders / Instructions Wound #4 Left Gluteus o Increase protein intake. Wound #5 Right Gluteus o Increase protein intake. Wound #6 Medial Sacrum o Increase protein intake. Medications-please add to medication list. Wound #4 Left Gluteus o Other: - Vitamins A, C and Zinc Wound #5 Right Gluteus o Other: - Vitamins A, C and Zinc Wound #6 Medial Sacrum o Other: - Vitamins A, C and Zinc Consults o General Surgery - [facility] Laboratory o Tissue Pathology biopsy report (PATH) - R gluteus Scurlock, ORESTE MAJEED ([id number]) oooo LOINC Code: 78295-6 oooo Convenience Name: Tiss Path Bx report o Bacteria identified in Wound by Culture (MICRO) - R gluteus oooo LOINC Code: 6462-6 oooo Convenience Name: Wound culture routine Radiology o Magnetic Resonance Imaging (MRI) - 3 view pelvis with contrast Electronic Signature(s) Signed: 01/23/2017 5:35:02 PM By: Elliot Gurney, BSN, RN, CWS, Kim RN, BSN Signed: 01/23/2017 5:42:06 PM By: Baltazar Najjar MD Entered By: Elliot Gurney, BSN, RN, CWS, Kim on 01/23/2017 16:43:04 Nand, [name] ([id number]) -------------------------------------------------------------------------------- Problem List Details Patient  Name: MACHI, WHITTAKER C. Date of Service: 01/23/2017 3:30 PM Medical Record Number: [id number] Patient Account Number: [id number] Date of Birth/Sex: [birth year] (66 y.o. Male) Treating RN: Renne Crigler Primary Care Provider: Elizabeth Sauer Other Clinician: Referring Provider: Elizabeth Sauer Treating Provider/Extender: Maxwell Caul Weeks in Treatment: 5 Active Problems ICD-10 Encounter Code Description Active Date Diagnosis 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, stage 3 01/24/2016 Yes S14.103S Unspecified injury at C3 level of cervical spinal cord, sequela 01/24/2016 Yes S51.011A Laceration without foreign body of right elbow, initial encounter 07/03/2016 Yes L89.322 Pressure ulcer of left buttock, stage 2 08/14/2016 Yes L89.310 Pressure ulcer of right buttock, unstageable 09/18/2016 Yes L03.317 Cellulitis of buttock 09/18/2016 Yes Inactive Problems Resolved Problems ICD-10 Code Description Active Date Resolved Date L89.622 Pressure ulcer of left heel, stage 2 06/26/2016 06/26/2016 Electronic Signature(s) Signed: 01/23/2017 5:42:06 PM By: Baltazar Najjar MD Entered By: Baltazar Najjar on 01/23/2017 17:30:19 Winstead, [name] ([id number]) -------------------------------------------------------------------------------- Progress Note Details Patient Name: [name] Bras C. Date of Service: 01/23/2017 3:30 PM Medical Record Number: [id number] Patient Account Number: [id number] Date of Birth/Sex: January 10, 1951 (66 y.o. 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 He has lower extremity sensation and can move his legs but has no/limited control over his arms. His  wife accompanies him today and states that in the late spring or early summer of 2017 the patient became very depressed. He refused the refused to mobilize and he developed several pressure ulcers on his back. Most of these have healed however they have a recalcitrant area over the right scapula. They've been using Santyl on this for at least the last month. In terms of depression the patient is doing better now on an antidepressant. He saw his primary physician on 01/12/16 at which time there was apparently green drainage coming out of this area [Dr. Deana Jones]. Dr. Yetta Barre works in the [facility]. He has completed this Septra. Otherwise looking through [facility] notes that he has a history of seizures. He also had a stroke in 2008 he follows with neurology. He also has type 2 diabetes on Glucophage, hyperlipidemia and gastroesophageal reflux. He takes Plavix for stroke prevention and Keppra for seizure prophylaxis. 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 02/08/16; this is a patient with a pressure ulcer over the right scapula. His wife has been doing the dressing with Santyl and border foam change every second day. When he arrived here 2 weeks ago we did a fairly extensive mechanical debridement. We are asked medical modalities to go out to the home and see what they might be eligible for in terms of pressure-relief surfaces [level 2] but the wife states that they have not heard from them. 03/20/15; this is a patient we haven't seen in 5-6 weeks. This was largely due to transportation issues. They've been using Santyl and border foam changing every second day for a pressure injury over the right scapula. The patient has a C3-C4 spinal injury. We had also asked for a review by the people who supplied DME to look at his wheelchair cushion, mattress etc. I don't know that this ever happened. In the  meantime the wound has done remarkably well current measurements 1.8 x 2 x 0.1 04/03/16; the patient's dimensions have gone up to 3 cm in diameter quite a deterioration from last time. He is also complaining of pain in this area which is new. 04/10/16; 3 x 1.5 x 0.1. No difference from last week. Culture I did last week was negative he has completed antibiotics. 04/24/16 2.4 x 2 x 0.1; wound generally looks smaller. Middle area that I had to debrided last time looks healthier. His son is fashioned a large piece of foam cut out where the patient's wound with hit the back of his wheelchair 05/01/16; wound is a same size however the surface of this looks better. The middle innkeeper area required a repeat debridement 05/15/16; wound is down and dimensions granulation still looks healthy. The medial aspect of this wound is now the deeper Divot. We'll see how this responds to further healing. We're using Hydrofera Blue 06/05/16; Wound 1.7x1.1x0.1 still using hyudrofera blue 06/26/16; the patient arrives back in clinic after a three-week hiatus. He was hospitalized from 06/09/16 through 06/10/16. He was found to be confused. CT scan of the head showed nothing really acute. His sodium was low at 131. He was rehydrated. He was felt to have  a UTI and given antibiotics and antibiotics at discharge although his final culture result only showed multiple organisms. His wife says today that at the time of the hospitalization they discovered a large intact blister over the large aspect of his left calcaneus. This is recently ruptured. The wound on his right scapula is somewhat larger. More his wife is concerned about his current status. She states that he is still confused sleeps for long periods. He is not having fever chills cough or diarrhea [1 loose bowel movement per day]. He continues to have a suprapubic catheter. She notes that he is not eating and drinking well. Patient states he just does not want to eat. He does  not feel nauseated or vomit. 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 Admission sodium was 131 at discharge 139 BUN 22 and 1.22 at admission, 17 and 1 at discharge. 07/03/16; patient's mental status is back to normal. He has a new wound on the right elbow caused by traumatizing his elbow against the wall apparently at the dermatologist office last week. We continue with the original wound on the right scapula and Dhaliwal, Borden C. ([id number]) the wound from 2 weeks ago on his left heel. We have been using silver alginate to the area on the heel. 08/14/16; patient has not been here in almost 6 weeks. When he was here last time he had the pressure ulcer on the right scapula, atraumatic wound on the right lateral elbow and an area on his left heel. His wife states that at one point all of these were healed and she didn't really feel he needed to come back here. Since then the patient has developed a reopening of the area on the right scapula, a stage II wound on the left buttock and a reopening of the area on the right lateral elbow. As usual his wife as a litany of complaints against home health, she is dismissing or is going to dismiss well care. She tells [place] she has a long list of supplies at home already for some reason they were not felt to be eligible for a group 2 or 3 surface although they have a hospital bed at home but no offloading surface 08/28/16; the patient arrived today unfortunately incontinent of stool in spite of this the wounds all appear to be better including the right scapular area, his left buttock's left heel and the right lateral elbow. 09/18/16; this is a patient who arrived today for follow-up of a pressure area over his right scapula area, left buttock and there was an area on his right lateral elbow. He arrived in clinic today with a worrisome area over the right initial  tuberosity/right buttock. This had a necrotic surface with draining purulence. He has not been systemically unwell. The area over the left elbow healed. He arrived in clinic today with dressing that hadn't been changed over the scapula for 2 or 3 days per her intake. His wife is previously fired home health 09/25/16;; the last time the patient was here he arrived with a new grossly infected wound over the right ischial tuberosity. Culture I did not show a specific pathogen. I did think this required admission to hospital and he was admitted from 09/18/16 through 09/20/16. Initially given vanc and Zosyn but then discharged with 5 days' worth of Augmentin. An MRI was done that did not show osteomyelitis of the right ischial tuberosity however  it did show cellulitis and possible myositis. He also has wounds on the left ischial tuberosity and the area over the right scapular area. 10/04/16 on evaluation today patient appears to be doing better in regard to his back wound and is stable in regard to the gluteal wounds. There does not appear to be any evidence of significant infection at this point. He is tolerating the dressing changes currently. His wife has been performing the dressing changes. Nonetheless he does have some discomfort especially in the gluteal areas although he did not specifically rate the discomfort there were times during evaluation where he flinched and it was obvious it was bothering him. No fevers, chills, nausea, or vomiting noted at this time. 10/17/16; the patient has bilateral ischial tuberosity wounds right greater than left. The left seems to be making good progress wears the right has not really changed that much and dimensions. There is also some threatened area on the right side around the wound circumference. Equally worrisome there is a DTI over the lower sacral area however this is not open as of yet. His area over the left scapula which is the chronic wound he was coming to  the clinic continues to make excellent progress 8/28 The patient comes in today wanting to talk about getting back in his wheelchair. He is very bored lying in bed at home 11/14/16; three-week hiatus for this patient for medical illnesses whether etc. He was in the emergency room at [facility] 2 days ago when his wife noted odor coming out of the right initial tuberosity wound and discoloration. I reviewed his ER presentation from 11/12/16. White count was 11.7 differential count reasonably normal basic metabolic panel was normal. Serum lactate was 1.9. He has a suprapubic catheter which showed positive urine nitrate large leukocytes and many bacteria. Urine cultures come back showing multiple species. Blood culture was negative. Wound culture is still pending. He was put on Keflex and Bactrim. The patient had an MRI of this area on 09/18/16 that did not show osteomyelitis however at that time there was possible cellulitis and infectious myositis no drainable fluid collection. After this he wishes admitted the hospital had vancomycin and Zosyn for 5 days and then 5 days of Augmentin 11/20/16; culture of the wound that I did last week showed multiple organisms. This was the same as from the emergency room as it turns out. He is still on Keflex and Bactrim and completing this. I was really expecting something a little more ominous although he seems to have stabilized. We have been using silver alginate to the major wound on the right ischial tuberosity. He had a DTI over the right greater trochanter, superficial wound over the left ischial tuberosity and a DTI on the superior part of the left hemipelvis. FORTUNATELY everything looks a little better here than last week. At my suggestion his wife looked into [facility] but was told that he needed a 3 day acute hospital stay. This would be similar to what is required for skilled nursing and I was not specifically aware of this although it may  have something to do with the patient's specific insurance 11/27/16; he has completed his antibiotics. All of his wounds appeared to have stabilized. This includes the necrotic right ischial tuberosity wound, DTI on the superior pelvis, DTI over the left greater trochanter. We have been using Aquacel Ag to all wound areas. He arrives in clinic today with a low temperature at first not registering at all then 91.5.  After serial checks with different instruments we got this up to 94 although the patient looks fine is mentating normally. His wife reports that this is often the pattern when he goes to the hospital and that they'll put him in a heating blanket and send him home although I don't really see evidence of this on the most recent ER trips. She states she has a warming blanket and a rectal for mom under the [facility], Kailo C. ([id number]) registers down to 91. I've asked her to check his temperature when she goes home. They both seemed very reluctant to go to the emergency department over this and to be truthful he looks stable enough to avoid this at least for now 12/03/16; patient arrives with all of his wounds and a considerably better situation. His wife states she has been changing him and turning him. The major remaining wound is in the right ischial tuberosity over even it looks well granulated and the copious amounts of necrotic tissue that I took out several weeks ago with coexistent infection looks to be resolved. We have been using silver alginate to all wounds 12/18/16; patient arrives for his two-week follow-up as usual accompanied by his wife. Unfortunately things have not gone well. They tell me that as of Friday evening he started to complain of severe pain in the lower buttock area presumably around the wounds although I'm not completely certain of that. This is different for this patient to rarely complains of any pain. They also noted significant odor to the wounds. We have  been using silver alginate. He has not had any falls. No fever [ or hypothermia]. He was at his primary doctor in [place] today and had lab work done. 12/26/16; patient's deep tissue culture from last week grew Proteus and MRSA. He started Septra DS and Augmentin late 2 days ago. He is not spontaneously complaining of as much pain as last week although the patient's wife states when she changes the dressing he complains of pain. He apparently refused to go and have x-rays of the pelvis done to look at the right ischial tuberosity specifically. We have been using silver alginate largely because of the necrotic wound in the lower part of the right ischial tuberosity. He has not been systemically unwell 01/02/17; patient's pelvic x-ray did not show underlying bony destruction. He still has considerable amount of necrotic debris in the lower part of his right ischial tuberosity. The lower sacral wound and left ischial tuberosity also have very necrotic surfaces. He has been on Septra and Augmentin purulent drainage out of the right ischial tuberosity and they're completing 2 weeks of therapy which I would like to extend for another 2 weeks. He would not be an easy candidate for an MRI of the right pelvis although this may become necessary. ROS; a 10 point review of systems is negative. Specifically no diarrhea fever or chills 01/09/17; continues on antibiotics for the purulent drainage they came out of the right initial tuberosity wound growing Proteus and MRSA. He should have enough for another week. I have not done more advanced imaging however his plain x-ray did not show bony destruction. We have been using silver alginate. Arrives in today with aggressive debridements of all 3 wounds. I am less worried about soft tissue infection on the right 01/15/17; he is completing his antibiotics for Proteus and MRSA growing out of the right ischial tuberosity this appears to be stable. Imaging here did not  show osteomyelitis although I have not  done an MRI. We use Medihoney last week not a lot of real difference. Still extensive necrotic debris. Unfortunately she does not have an out-of-pocket max and Santyl cost her $260 per tube in the past, this is beyond her means. 01/23/17; he completed the antibiotics that I gave him about a week ago. Nevertheless there is apparently an odor that even the patient complains about. 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 He is not febrile but he does complain of pain in the right buttock area I think. He has cervical cord injury but still has lower extremity sensation. For some reason I changed him to silver collagen last time. I can't re-create my thought process here. They could not afford Santyl Objective Constitutional Patient is hypotensive.. Pulse regular and within target range for patient.Marland Kitchen Respirations regular, non-labored and within target range.. Temperature is normal and within the target range for the patient.Marland Kitchen appears in no distress. Vitals Time Taken: 3:32 PM, Height: 70 in, Weight: 187 lbs, BMI: 26.8, Temperature: 97.5 F, Pulse: 85 bpm, Respiratory Rate: 16 breaths/min, Blood Pressure: 92/57 mmHg. General Notes: Wound exam; the patient has 3 necrotic areas predominantly the right initial tuberosity but also on the lower sacrum and the left initial tuberosity Using pickups and a #15 scalpel. I remove necrotic material over the surface of the right Amendola, Binh C. ([id number]) initial tuberosity. This clearly goes down to bone. Using Rongeurs I attempted to get a piece of bone for both pathology and culture. Not completely certain how successful I was but I certainly got deep tissue Using a #5 curet the sacrum and left initial tuberosity wounds were also debrided. I have not been able  to get down to viable surface here. There is no clear surrounding soft tissue infection although there is an odor coming from the right ischial tuberosity wound Integumentary (Hair, Skin) Wound #4 status is Open. Original cause of wound was Pressure Injury. The wound is located on the Left Gluteus. The wound measures 1.8cm length x 2.8cm width x 0.7cm depth; 3.958cm^2 area and 2.771cm^3 volume. Wound #5 status is Open. Original cause of wound was Pressure Injury. The wound is located on the Right Gluteus. The wound measures 4cm length x 4.8cm width x 1.2cm depth; 15.08cm^2 area and 18.096cm^3 volume. There is muscle, Fat Layer (Subcutaneous Tissue) Exposed, and fascia exposed. There is undermining starting at 7:00 and ending at 12:00 with a maximum distance of 1.8cm. There is a large amount of serous drainage noted. Foul odor after cleansing was noted. The wound margin is flat and intact. There is medium (34-66%) red granulation within the wound bed. There is a medium (34- 66%) amount of necrotic tissue within the wound bed including Eschar and Adherent Slough. The periwound skin appearance did not exhibit: Callus, Crepitus, Excoriation, Induration, Rash, Scarring, Dry/Scaly, Maceration, Atrophie Blanche, Cyanosis, Ecchymosis, Hemosiderin Staining, Mottled, Pallor, Rubor, Erythema. Periwound temperature was noted as No Abnormality. The periwound has tenderness on palpation. Wound #6 status is Open. Original cause of wound was Pressure Injury. The wound is located on the Medial Sacrum. The wound measures 2cm length x 5cm width x 1.3cm depth; 7.854cm^2 area and 10.21cm^3 volume. 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, stage 3 S14.103S - Unspecified injury at C3 level of cervical spinal cord, sequela S51.011A - Laceration without foreign body of  right elbow, initial encounter L89.322 - Pressure ulcer of left buttock, stage 2 L89.310 - Pressure ulcer of right  buttock, unstageable L03.317 - Cellulitis of buttock Procedures Wound #4 Pre-procedure diagnosis of Wound #4 is a Pressure Ulcer located on the Left Gluteus . There was a Skin/Subcutaneous Tissue Debridement ([id number]) debridement with total area of 5.04 sq cm performed by Maxwell Caul, MD. with the following instrument(s): Curette to remove Viable and Non-Viable tissue/material including Fibrin/Slough, Eschar, and Subcutaneous after achieving pain control using Other (lidocaine 4%). A time out was conducted at 16:00, prior to the start of the procedure. A Large amount of bleeding was controlled with Pressure. The procedure was tolerated well with a pain level of 3 throughout and a pain level of 3 following the procedure. Post Debridement Measurements: 1.8cm length x 2.8cm width x 0.7cm depth; 2.771cm^3 volume. Post debridement Stage noted as Category/Stage III. Character of Wound/Ulcer Post Debridement requires further debridement. Post procedure Diagnosis Wound #4: Same as Pre-Procedure Wound #5 Pre-procedure diagnosis of Wound #5 is a Pressure Ulcer located on the Right Gluteus . There was a Skin/Subcutaneous Polizzi, Kosisochukwu C. ([id number]) Tissue/Muscle/Bone Debridement [phone]) debridement with total area of 19.2 sq cm performed by Maxwell Caul, MD. with the following instrument(s): Blade, Curette, Forceps, and Rongeur to remove Viable and Non-Viable tissue/material including Bone, Fibrin/Slough, Muscle, Eschar, and Subcutaneous after achieving pain control using Other (lidocaine 4%). 2 specimens were taken by a Swab and Biopsy and sent to the lab per facility protocol.A time out was conducted at 16:00, prior to the start of the procedure. A Large amount of bleeding was controlled with Pressure. The procedure was tolerated well with a pain level of 5 throughout and a pain level of 3 following the procedure. Post Debridement Measurements: 4cm length x 4.8cm width x 1.5cm  depth; 22.619cm^3 volume. Post debridement Stage noted as Category/Stage IV. Character of Wound/Ulcer Post Debridement requires further debridement. Post procedure Diagnosis Wound #5: Same as Pre-Procedure Wound #6 Pre-procedure diagnosis of Wound #6 is a Pressure Ulcer located on the Medial Sacrum . There was a Skin/Subcutaneous Tissue Debridement ([id number]) debridement with total area of 10 sq cm performed by Maxwell Caul, MD. with the following instrument(s): Curette to remove Viable and Non-Viable tissue/material including Fibrin/Slough, Eschar, and Subcutaneous after achieving pain control using Other (lidocaine 4%). A time out was conducted at 16:00, prior to the start of the procedure. A Large amount of bleeding was controlled with Pressure. The procedure was tolerated well with a pain level of 3 throughout and a pain level of 3 following the procedure. Post Debridement Measurements: 2cm length x 5cm width x 1.5cm depth; 11.781cm^3 volume. Post debridement Stage noted as Category/Stage III. Character of Wound/Ulcer Post Debridement requires further debridement. Post procedure Diagnosis Wound #6: Same as Pre-Procedure Plan Wound Cleansing: Wound #4 Left Gluteus: Clean wound with Normal Saline. Cleanse wound with mild soap and water Wound #5 Right Gluteus: Clean wound with Normal Saline. Cleanse wound with mild soap and water Wound #6 Medial Sacrum: Clean wound with Normal Saline. Cleanse wound with mild soap and water Anesthetic: Wound #4 Left Gluteus: Topical Lidocaine 4% cream applied to wound bed prior to debridement Wound #5 Right Gluteus: Topical Lidocaine 4% cream applied to wound bed prior to debridement Wound #6 Medial Sacrum: Topical Lidocaine 4% cream applied to wound bed prior to debridement Primary Wound Dressing: Wound #4 Left Gluteus: Other: - SIlvercell Wound #5 Right Gluteus: Other: - SIlvercell Wound #6 Medial Sacrum: Other: -  SIlvercell Secondary  Dressing: Wound #4 Left Gluteus: Boardered Foam Dressing Wound #5 Right Gluteus: Boardered Foam Dressing Cygan, Javan C. ([id number]) Wound #6 Medial Sacrum: Boardered Foam Dressing Dressing Change Frequency: Wound #4 Left Gluteus: Change dressing every day. Wound #5 Right Gluteus: Change dressing every day. Wound #6 Medial Sacrum: Change dressing every day. Follow-up Appointments: Wound #4 Left Gluteus: Return Appointment in 1 week. Wound #5 Right Gluteus: Return Appointment in 1 week. Wound #6 Medial Sacrum: Return Appointment in 1 week. Off-Loading: Wound #4 Left Gluteus: Turn and reposition every 2 hours Wound #5 Right Gluteus: Turn and reposition every 2 hours Wound #6 Medial Sacrum: Turn and reposition every 2 hours Additional Orders / Instructions: Wound #4 Left Gluteus: Increase protein intake. Wound #5 Right Gluteus: Increase protein intake. Wound #6 Medial Sacrum: Increase protein intake. Medications-please add to medication list.: Wound #4 Left Gluteus: Other: - Vitamins A, C and Zinc Wound #5 Right Gluteus: Other: - Vitamins A, C and Zinc Wound #6 Medial Sacrum: Other: - Vitamins A, C and Zinc Laboratory ordered were: Tiss Path Bx report - R gluteus, Wound culture routine - R gluteus Radiology ordered were: Magnetic Resonance Imaging (MRI) - 3 view pelvis with contrast Consults ordered were: General Surgery - [facility] #1 extensive debridement of the right ischial tuberosity wound. I was successful in getting most of the necrotic surface off this. There is exposed bone here. I was able to get pieces for pathology and culture #2 he will definitely need an MRI with contrast to look predominantly at the lower sacrum, right and left ischial tuberosities Sherpa, Ad C. ([id number]) #3 all C of General surgery would be willing to take him to the OR to debridement the coccyx area and the left ischial tuberosity. As of today I don't  think there is much more on the right that can be debrided #4 patient's wife asking for narcotics essentially stating that her primary doctor Dr. Yetta Barre in [place] would not prescribe him narcotics. His wife stated that Dr. Yetta Barre said " her hands were tied". I looked through Dr. Yetta Barre last 3 visits I don't see any reference to narcotics or any other difficulties. It wouldn't be unreasonable to give him a limited supply of medications although I would like to talk to Dr. Yetta Barre about this Electronic Signature(s) Signed: 01/23/2017 5:42:06 PM By: Baltazar Najjar MD Entered By: Baltazar Najjar on 01/23/2017 17:39:29 Urton, [name] ([id number]) -------------------------------------------------------------------------------- SuperBill Details Patient Name: [name] Bras C. Date of Service: 01/23/2017 Medical Record Number: [id number] Patient Account Number: [id number] Date of Birth/Sex: September 16, 1950 (66 y.o. Male) Treating RN: Renne Crigler Primary Care Provider: Elizabeth Sauer Other Clinician: Referring Provider: Elizabeth Sauer Treating Provider/Extender: Maxwell Caul Weeks in Treatment: 30 Diagnosis Coding ICD-10 Codes Code Description [phone] Pressure ulcer of other site, stage 3 S14.103S Unspecified injury at C3 level of cervical spinal cord, sequela S51.011A Laceration without foreign body of right elbow, initial encounter L89.322 Pressure ulcer of left buttock, stage 2 L89.310 Pressure ulcer of right buttock, unstageable L03.317 Cellulitis of buttock Facility Procedures CPT4 Code: [id number] Description: 11044 - DEB BONE 20 SQ CM/< ICD-10 Diagnosis Description L89.310 Pressure ulcer of right buttock, unstageable Modifier: Quantity: 1 CPT4 Code: [id number] Description: 11042 - DEB SUBQ TISSUE 20 SQ CM/< ICD-10 Diagnosis Description L89.322 Pressure ulcer of left buttock, stage 2 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, stage 3 L89.310 Pressure ulcer of right buttock,  unstageable Modifier: Quantity: 1 Physician Procedures CPT4: Description Modifier Quantity Code [id number] Debridement; bone (  includes epidermis, dermis, subQ tissue, muscle and/or fascia, if 1 performed) 1st 20 sqcm or less ICD-10 Diagnosis Description L89.310 Pressure ulcer of right buttock, unstageable CPT4: [id number] 11042 - WC PHYS SUBQ TISS 20 SQ CM 1 ICD-10 Diagnosis Description L89.322 Pressure ulcer of left buttock, stage 2 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, stage 3 L89.310 Pressure ulcer of right buttock, unstageable Electronic Signature(s) Signed: 01/24/2017 2:29:21 PM By: Elliot GurneyWoody, BSN, RN, CWS, Kim RN, BSN Previous Signature: 01/24/2017 2:28:24 PM Version By: Elliot GurneyWoody, BSN, RN, CWS, Kim RN, BSN [address], GuayanillaRICHARD C. ([id number]) Previous Signature: 01/23/2017 5:42:06 PM Version By: Baltazar Najjarobson, Michael MD Entered By: Elliot GurneyWoody, BSN, RN, CWS, Kim on 01/24/2017 14:29:21

## 2017-01-26 LAB — AEROBIC CULTURE  (SUPERFICIAL SPECIMEN)

## 2017-01-26 LAB — AEROBIC CULTURE W GRAM STAIN (SUPERFICIAL SPECIMEN)

## 2017-01-29 ENCOUNTER — Ambulatory Visit
Admission: RE | Admit: 2017-01-29 | Discharge: 2017-01-29 | Disposition: A | Discharge status: Home / Self Care | Payer: Medicare Other | Source: Ambulatory Visit | Attending: Internal Medicine | Admitting: Internal Medicine

## 2017-01-29 DIAGNOSIS — L89329 Pressure ulcer of left buttock, unspecified stage: Secondary | ICD-10-CM | POA: Insufficient documentation

## 2017-01-29 DIAGNOSIS — M869 Osteomyelitis, unspecified: Secondary | ICD-10-CM | POA: Insufficient documentation

## 2017-01-29 DIAGNOSIS — L89894 Pressure ulcer of other site, stage 4: Secondary | ICD-10-CM | POA: Diagnosis not present

## 2017-01-29 DIAGNOSIS — L89312 Pressure ulcer of right buttock, stage 2: Secondary | ICD-10-CM | POA: Diagnosis not present

## 2017-01-29 DIAGNOSIS — L89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, stage 3: Secondary | ICD-10-CM | POA: Diagnosis present

## 2017-01-29 DIAGNOSIS — L89322 Pressure ulcer of left buttock, stage 2: Secondary | ICD-10-CM

## 2017-01-29 MED ORDER — GADOBENATE DIMEGLUMINE 529 MG/ML IV SOLN
10.0000 mL | Freq: Once | INTRAVENOUS | Status: AC | PRN
  Administered 2017-01-29: 10 mL via INTRAVENOUS

## 2017-01-30 ENCOUNTER — Encounter: Payer: Self-pay | Admitting: Surgery

## 2017-01-30 ENCOUNTER — Ambulatory Visit (INDEPENDENT_AMBULATORY_CARE_PROVIDER_SITE_OTHER): Payer: Medicare Other | Admitting: Surgery

## 2017-01-30 ENCOUNTER — Encounter: Payer: Medicare Other | Admitting: Internal Medicine

## 2017-01-30 VITALS — Temp 97.6°F

## 2017-01-30 DIAGNOSIS — L89154 Pressure ulcer of sacral region, stage 4: Secondary | ICD-10-CM

## 2017-01-30 DIAGNOSIS — L89313 Pressure ulcer of right buttock, stage 3: Secondary | ICD-10-CM | POA: Diagnosis not present

## 2017-01-30 DIAGNOSIS — L89323 Pressure ulcer of left buttock, stage 3: Secondary | ICD-10-CM | POA: Diagnosis not present

## 2017-01-30 LAB — PATHOLOGY

## 2017-01-30 MED ORDER — DAKINS (1/4 STRENGTH) 0.125 % EX SOLN: 1.0000 "application " | Freq: Two times a day (BID) | CUTANEOUS | 0 refills | Status: AC

## 2017-01-30 NOTE — Patient Instructions (Signed)
Please change dressing twice daily.  Please see your follow up appointment listed below.

## 2017-01-30 NOTE — Progress Notes (Signed)
01/30/2017  Reason for Visit:  Pressure ulcers requiring debridement  Referring Provider:  Baltazar NajjarMichael Robson, MD  History of Present Illness: [name] is a 66 y.o. male who presents with an 8+ month history of bilateral and sacral pressure ulcers, which have been worsening in size.  The patient and his wife report that they started small and now are much bigger, with worse foul odor, and necrotic tissue.  They had tried different dressings and even had antibiotics on board for about 3 weeks, but there's been no improvement.  He is followed at the wound care center and they have done bedside debridements as well.  The areas are painful to the patient.  He has a history of fall with cervical spine fracture and incomplete quadriplegia and requires frequent offloading and turning at home.  His wife and son are his primary caregivers.   Past Medical History: Past Medical History:  Diagnosis Date  . Allergy   . 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 Anxiety   . CAD (coronary artery disease)   . Carotid stenosis 03/20/2016  . Diabetes mellitus without complication (HCC)   . GERD (gastroesophageal reflux disease)   . Hemiplegia (HCC) 10/01/2012  . HLD (hyperlipidemia) 10/01/2012  . Hyperlipidemia   . Insomnia 02/23/2015  . Quadriplegia (HCC)   . Seizure (HCC) 10/01/2012  . Seizures (HCC)   . Spinal cord disease (HCC)   . Stroke [facility](HCC)      Past Surgical History: Past Surgical History:  Procedure Laterality Date  . ENDARTERECTOMY    . ENDARTERECTOMY    . TRACHEOSTOMY      Home Medications: Prior to Admission medications   Medication Sig Start Date End Date Taking? Authorizing Provider  ALPRAZolam Prudy Feeler(XANAX) 0.25 MG tablet Take by mouth. 02/23/15  Yes [provider]  atorvastatin (LIPITOR) 10 MG tablet Take 1 tablet (10 mg total) by mouth daily. 12/18/16  Yes Duanne LimerickJones, Deanna C, MD  baclofen (LIORESAL) 20 MG tablet Take 2 tablets (40 mg total) by mouth at bedtime. Dr  Carlena HurlFiler 12/18/16  Yes Duanne LimerickJones, Deanna C, MD  citalopram (CELEXA) 20 MG tablet  10/24/16  Yes [provider]  citalopram (CELEXA) 40 MG tablet Take 1 tablet (40 mg total) by mouth daily. 12/18/16  Yes Duanne LimerickJones, Deanna C, MD  clopidogrel (PLAVIX) 75 MG tablet Take 1 tablet (75 mg total) by mouth daily. 12/18/16  Yes Duanne LimerickJones, Deanna C, MD  clopidogrel (PLAVIX) 75 MG tablet Take 1 tablet by mouth  daily 06/08/15  Yes [provider]  diazepam (VALIUM) 5 MG tablet Take 5-10 mg by mouth 3 (three) times daily as needed for muscle spasms. Take 5 mg in the morning, 5 mg at noon and 10 mg every night at bedtime as needed for spasms. Dr Carlena HurlFiler 03/02/15  Yes [provider]  furosemide (LASIX) 20 MG tablet Take 1 tablet (20 mg total) by mouth daily. 05/15/16  Yes Duanne LimerickJones, Deanna C, MD  hydrocortisone 2.5 % ointment 1(ONE) APPLICATION(S) TOPICAL 2(TWO) TIMES A DAY 10/25/16  Yes [provider]  levETIRAcetam (KEPPRA) 750 MG tablet Take 750 mg by mouth 3 (three) times daily. Dr Malvin JohnsPotter   Yes [provider]  metFORMIN (GLUCOPHAGE) 500 MG tablet Take 1 tablet (500 mg total) by mouth 2 (two) times daily with a meal. 12/18/16  Yes Duanne LimerickJones, Deanna C, MD  mirtazapine (REMERON) 15 MG tablet Take 1 tablet (15 mg total) by mouth at bedtime. Dr. Carlena HurlFiler 12/18/16  Yes Duanne LimerickJones, Deanna C,  MD  oxybutynin (DITROPAN) 5 MG tablet Take 1 tablet (5 mg total) by mouth 2 (two) times daily. 12/18/16  Yes Duanne LimerickJones, Deanna C, MD  pantoprazole (PROTONIX) 40 MG tablet Take 1 tablet (40 mg total) by mouth daily. 12/18/16  Yes Duanne LimerickJones, Deanna C, MD  sodium hypochlorite (DAKIN'S 1/4 STRENGTH) 0.125 % SOLN Irrigate with 1 application as directed 2 times daily at 12 noon and 4 pm. 01/30/17   Henrene DodgePiscoya, Cordarrius Coad, MD    Allergies: No Known Allergies  Social History:  reports that  has never smoked. he has never used smokeless tobacco. He reports that he does not drink alcohol or use drugs.   Family History: Family History   Problem Relation Age of Onset  . Stroke Father   . Parkinson's disease Father   . Heart disease Mother     Review of Systems: Review of Systems  Constitutional: Negative for chills and fever.  HENT: Negative for hearing loss.   Eyes: Negative for blurred vision.  Respiratory: Negative for shortness of breath.   Cardiovascular: Negative for chest pain.  Gastrointestinal: Negative for abdominal pain, nausea and vomiting.  Genitourinary: Negative for dysuria.  Musculoskeletal: Negative for myalgias.  Skin:       Worsening necrosis with pain  Neurological: Negative for dizziness.  Psychiatric/Behavioral: Negative for depression.  All other systems reviewed and are negative.   Physical Exam Temp 97.6 F (36.4 C) (Oral)  CONSTITUTIONAL: No acute distress HEENT:  Normocephalic, atraumatic, extraocular motion intact. NECK: Trachea is midline, and there is no jugular venous distension.  RESPIRATORY:  Lungs are clear, and breath sounds are equal bilaterally. Normal respiratory effort without pathologic use of accessory muscles. CARDIOVASCULAR: Heart is regular without murmurs, gallops, or rubs. GI: The abdomen is soft, nondistended, nontender to palpation. GU:  Foley catheter in place. MUSCULOSKELETAL:   Unable to move extremities. SKIN: Patient has stage 3 pressure ulcers over the bilateral ischial tuberosities and a stage 4 pressure ulcer over the sacrum.  All three have necrotic tissue and fibrinous material. No purulent drainage.  Right wound measures 5 x 5.5 cm, left wound measures 4 x 2.5 cm, and sacral wound measures 5.5 x 3 cm.   NEUROLOGIC:  Quadriplegic PSYCH:  Alert and oriented to person, place and time. Affect is normal.  Labs/Images: --None recently  Assessment and Plan: This is a 66 y.o. male who presents with 3 pressure ulcers that require debridement.    At bedside, all three wounds were exposed and sharply debrided using scalpel and Metzenbaum scissors, down to  healthier bleeding tissue.  Total surface area of 54 sq cm was debrided.  Patient tolerated the procedure well and the wounds were then dressed with wet to dry gauze.  Discussed with patient and his family that after this debridement, we should do Dakins solution wet to dry dressing changes for 3 days, twice daily.  This will help with continued debridement and removal of any remaining necrotic or infected tissue.  Afterwards, can continue with his regular dressing changes and instructed by the wound care center.  Patient will follow up with us next week to evaluate his wounds and reassess for any further debridement needed.  Patient and his family understand this plan and all of their questions have been answered.    Howie IllJose Luis Ebonee Stober, MD [facility]

## 2017-01-31 NOTE — Progress Notes (Signed)
TEKOA, AMON ([id number]) Visit Report for 01/15/2017 Arrival Information Details Patient Name: [name], [name]. Date of Service: 01/15/2017 11:00 AM Medical Record Number: [id number] Patient Account Number: [id number] Date of Birth/Sex: [birth year] (66 y.o. Male) Treating RN: Cornell Barman Primary Care Erlin Gardella: Otilio Miu Other Clinician: Referring Hawkins Seaman: Otilio Miu Treating Edina Winningham/Extender: Tito Dine in Treatment: 56 Visit Information History Since Last Visit Added or deleted any medications: No Patient Arrived: Wheel Chair Any new allergies or adverse reactions: No Arrival Time: 10:33 Had a fall or experienced change in No Accompanied By: wife, activities of daily living that may affect Rhonda risk of falls: Transfer Assistance: [employer] Signs or symptoms of abuse/neglect since last visito No Patient Identification Verified: Yes Hospitalized since last visit: No Secondary Verification Process Completed: Yes Has Dressing in Place as Prescribed: Yes Patient Requires Transmission-Based No Pain Present Now: Yes Precautions: Patient Has Alerts: Yes Electronic Signature(s) Signed: 01/21/2017 2:47:47 PM By: Gretta Cool, BSN, RN, CWS, Kim RN, BSN Entered By: Gretta Cool, BSN, RN, CWS, Kim on 01/15/2017 10:33:59 [name] ([id number]) -------------------------------------------------------------------------------- Encounter Discharge Information Details Patient Name: [name], [name] Date of Service: 01/15/2017 11:00 AM Medical Record Number: [id number] Patient Account Number: [id number] Date of Birth/Sex: 1950-11-25 (66 y.o. Male) Treating RN: Roger Shelter Primary Care Adonai Selsor: Otilio Miu Other Clinician: Referring Nahdia Doucet: Otilio Miu Treating Atzin Buchta/Extender: Tito Dine in Treatment: 50 Encounter Discharge Information Items Discharge Pain Level: 0 Discharge Condition: Stable Ambulatory Status: Wheelchair Discharge  Destination: Home Transportation: Private Auto Accompanied By: wife Schedule Follow-up Appointment: Yes Medication Reconciliation completed and No provided to Patient/Care Doreen Garretson: Patient Clinical Summary of Care: Declined Electronic Signature(s) Signed: 01/30/2017 9:57:26 AM By: Ruthine Dose Entered By: Ruthine Dose on 01/15/2017 11:24:55 [name] ([id number]) -------------------------------------------------------------------------------- Lower Extremity Assessment Details Patient Name: [name], [name] Date of Service: 01/15/2017 11:00 AM Medical Record Number: [id number] Patient Account Number: [id number] Date of Birth/Sex: 1951-02-11 (66 y.o. Male) Treating RN: Cornell Barman Primary Care Rhayne Chatwin: Otilio Miu Other Clinician: Referring Nayali Talerico: Otilio Miu Treating Meleana Commerford/Extender: Tito Dine in Treatment: 55 Electronic Signature(s) Signed: 01/21/2017 2:47:47 PM By: Gretta Cool, BSN, RN, CWS, Kim RN, BSN Entered By: Gretta Cool, BSN, RN, CWS, Kim on 01/15/2017 10:52:26 [name] ([id number]) -------------------------------------------------------------------------------- Multi Wound Chart Details Patient Name: [name], [name] Date of Service: 01/15/2017 11:00 AM Medical Record Number: [id number] Patient Account Number: [id number] Date of Birth/Sex: [birth year] (66 y.o. Male) Treating RN: Cornell Barman Primary Care Nakeita Styles: Otilio Miu Other Clinician: Referring Jacquel Mccamish: Otilio Miu Treating Camielle Sizer/Extender: Ricard Dillon Weeks in Treatment: 83 Vital Signs Height(in): 70 Pulse(bpm): 88 Weight(lbs): 187 Blood Pressure(mmHg): 122/63 Body Mass Index(BMI): 27 Temperature(F): Respiratory Rate 16 (breaths/min): Photos: Wound Location: Left Gluteus Right Gluteus Sacrum - Medial Wounding Event: Pressure Injury Pressure Injury Pressure Injury Primary Etiology: Pressure Ulcer Pressure Ulcer Pressure Ulcer Comorbid History: Coronary  Artery Disease, Type Coronary Artery Disease, Type Coronary Artery Disease, Type II Diabetes, Quadriplegia, II Diabetes, Quadriplegia, II Diabetes, Quadriplegia, Seizure Disorder Seizure Disorder Seizure Disorder Date Acquired: 07/09/2016 09/10/2016 10/16/2016 Weeks of Treatment: '22 17 12 '$ Wound Status: Open Open Open Measurements L x W x D 2.6x2.5x0.6 4.3x4.7x1.4 2.2x4.3x1 (cm) Area (cm) : 5.105 15.873 7.43 Volume (cm) : 3.063 22.222 7.43 % Reduction in Area: -3151.60% -180.70% 65.00% % Reduction in Volume: -19043.70% -3833.10% -250.30% Starting Position 1 6 (o'clock): Ending Position 1 12 (o'clock): Maximum Distance 1 (cm): 2.2 Undermining: No Yes No Classification: Unstageable/Unclassified Category/Stage IV Category/Stage III Exudate Amount: Medium Large Large Exudate Type:  Serosanguineous Serous Serosanguineous Exudate Color: red, brown amber red, brown Foul Odor After Cleansing: No Yes No Odor Anticipated Due to N/A No N/A Product Use: Wound Margin: Flat and Intact Flat and Intact Flat and Intact Granulation Amount: None Present (0%) Medium (34-66%) None Present (0%) Granulation Quality: N/A Red N/A Pella, Libero C. ([id number]) Necrotic Amount: Large (67-100%) Medium (34-66%) Large (67-100%) Necrotic Tissue: Eschar, Adherent Slough Eschar, Adherent Long Exposed Structures: Fat Layer (Subcutaneous Fat Layer (Subcutaneous Fat Layer (Subcutaneous Tissue) Exposed: Yes Tissue) Exposed: Yes Tissue) Exposed: Yes Fascia: No Muscle: Yes Fascia: No Tendon: No Fascia: No Tendon: No Muscle: No Tendon: No Muscle: No Joint: No Joint: No Joint: No Bone: No Bone: No Bone: No Epithelialization: None None None Debridement: Debridement ([id number]) Debridement ([id number]) Debridement ([id number]) Pre-procedure 11:00 11:00 11:00 Verification/Time Out Taken: Pain Control: Other Other Other Tissue Debrided: Necrotic/Eschar, Necrotic/Eschar,  Necrotic/Eschar, Fibrin/Slough, Muscle, Fibrin/Slough, Muscle, Fibrin/Slough, Muscle, Subcutaneous Subcutaneous Subcutaneous Level: Skin/Subcutaneous Skin/Subcutaneous Skin/Subcutaneous Tissue/Muscle Tissue/Muscle Tissue/Muscle Debridement Area (sq cm): 6.5 5.04 9.46 Instrument: Curette Curette Curette Bleeding: Moderate Moderate Moderate Hemostasis Achieved: Pressure Pressure Pressure Procedural Pain: '5 5 5 '$ Post Procedural Pain: 0 0 0 Debridement Treatment Procedure was tolerated well Procedure was tolerated well Procedure was tolerated well Response: Post Debridement 2.6x2.5x0.6 4.3x4.7x1.4 2.2x4.3x1 Measurements L x W x D (cm) Post Debridement Volume: 3.063 22.222 7.43 (cm) Post Debridement Stage: Unstageable/Unclassified Category/Stage IV Category/Stage III Periwound Skin Texture: Excoriation: No Excoriation: No No Abnormalities Noted Induration: No Induration: No Callus: No Callus: No Crepitus: No Crepitus: No Rash: No Rash: No Scarring: No Scarring: No Periwound Skin Moisture: Maceration: No Maceration: No No Abnormalities Noted Dry/Scaly: No Dry/Scaly: No Periwound Skin Color: Ecchymosis: Yes Atrophie Blanche: No Ecchymosis: Yes Atrophie Blanche: No Cyanosis: No Cyanosis: No Ecchymosis: No Erythema: No Erythema: No Hemosiderin Staining: No Hemosiderin Staining: No Mottled: No Mottled: No Pallor: No Pallor: No Rubor: No Rubor: No Temperature: N/A No Abnormality N/A Tenderness on Palpation: No Yes No Wound Preparation: Ulcer Cleansing: Ulcer Cleansing: Topical Anesthetic Applied: Rinsed/Irrigated with Saline Rinsed/Irrigated with Saline Other: lidocaine 4% Topical Anesthetic Applied: Topical Anesthetic Applied: Other: lidocaine 4% Other: lidocaine 4% Procedures Performed: Debridement Debridement Debridement Belter, Camauri C. ([id number]) Treatment Notes Wound #4 (Left Gluteus) 1. Cleansed with: Clean wound with Normal Saline 2.  Anesthetic Topical Lidocaine 4% cream to wound bed prior to debridement 4. Dressing Applied: Prisma Ag 5. Secondary Dressing Applied Bordered Foam Dressing Wound #5 (Right Gluteus) 1. Cleansed with: Clean wound with Normal Saline 2. Anesthetic Topical Lidocaine 4% cream to wound bed prior to debridement 4. Dressing Applied: Prisma Ag 5. Secondary Dressing Applied Bordered Foam Dressing Wound #6 (Medial Sacrum) 1. Cleansed with: Clean wound with Normal Saline 2. Anesthetic Topical Lidocaine 4% cream to wound bed prior to debridement 4. Dressing Applied: Prisma Ag 5. Secondary Dressing Applied Bordered Foam Dressing Electronic Signature(s) Signed: 01/16/2017 8:07:00 AM By: Linton Ham MD Entered By: Linton Ham on 01/15/2017 12:25:21 Sotomayor, [name] ([id number]) -------------------------------------------------------------------------------- Multi-Disciplinary Care Plan Details Patient Name: LAVAL, CAFARO. Date of Service: 01/15/2017 11:00 AM Medical Record Number: [id number] Patient Account Number: [id number] Date of Birth/Sex: [birth year] (66 y.o. Male) Treating RN: Cornell Barman Primary Care Mae Denunzio: Otilio Miu Other Clinician: Referring Naiya Corral: Otilio Miu Treating Kaelene Elliston/Extender: Tito Dine in Treatment: 47 Active Inactive ` Nutrition Nursing Diagnoses: Imbalanced nutrition Impaired glucose control: actual or potential Goals: Patient/caregiver agrees to and verbalizes understanding of need to use nutritional supplements and/or vitamins as prescribed Date Initiated: 01/24/2016 Target  Resolution Date: 03/27/2016 Goal Status: Active Patient/caregiver will maintain therapeutic glucose control Date Initiated: 01/24/2016 Target Resolution Date: 03/27/2016 Goal Status: Active Interventions: Assess patient nutrition upon admission and as needed per policy Provide education on elevated blood sugars and impact on wound  healing Notes: ` Orientation to the Wound Care Program Nursing Diagnoses: Knowledge deficit related to the wound healing center program Goals: Patient/caregiver will verbalize understanding of the [place] Program Date Initiated: 01/24/2016 Target Resolution Date: 03/27/2016 Goal Status: Active Interventions: Provide education on orientation to the wound center Notes: ` Pain, Acute or Chronic Nursing Diagnoses: Pain, acute or chronic: actual or potential Potential alteration in comfort, pain Gilcrease, [name] ([id number]) Goals: Patient will verbalize adequate pain control and receive pain control interventions during procedures as needed Date Initiated: 01/24/2016 Target Resolution Date: 03/27/2016 Goal Status: Active Patient/caregiver will verbalize adequate pain control between visits Date Initiated: 01/24/2016 Target Resolution Date: 03/27/2016 Goal Status: Active Patient/caregiver will verbalize comfort level met Date Initiated: 01/24/2016 Target Resolution Date: 03/27/2016 Goal Status: Active Interventions: Assess comfort goal upon admission Complete pain assessment as per visit requirements Notes: ` Pressure Nursing Diagnoses: Knowledge deficit related to management of pressures ulcers Potential for impaired tissue integrity related to pressure, friction, moisture, and shear Goals: Patient will remain free from development of additional pressure ulcers Date Initiated: 01/24/2016 Target Resolution Date: 03/27/2016 Goal Status: Active Interventions: Assess: immobility, friction, shearing, incontinence upon admission and as needed Assess offloading mechanisms upon admission and as needed Assess potential for pressure ulcer upon admission and as needed Provide education on pressure ulcers Notes: ` Wound/Skin Impairment Nursing Diagnoses: Impaired tissue integrity Goals: Ulcer/skin breakdown will have a volume reduction of 30% by week 4 Date  Initiated: 01/24/2016 Target Resolution Date: 03/27/2016 Goal Status: Active Ulcer/skin breakdown will have a volume reduction of 50% by week 8 Date Initiated: 01/24/2016 Target Resolution Date: 03/27/2016 Goal Status: Active Ulcer/skin breakdown will have a volume reduction of 80% by week 12 Date Initiated: 01/24/2016 Target Resolution Date: 03/27/2016 [name], [name] ([id number]) Goal Status: Active Interventions: Assess patient/caregiver ability to perform ulcer/skin care regimen upon admission and as needed Assess ulceration(s) every visit Notes: Electronic Signature(s) Signed: 01/21/2017 2:47:47 PM By: Gretta Cool, BSN, RN, CWS, Kim RN, BSN Entered By: Gretta Cool, BSN, RN, CWS, Kim on 01/15/2017 10:52:35 Sligh, [name] ([id number]) -------------------------------------------------------------------------------- Pain Assessment Details Patient Name: [name], [name] Date of Service: 01/15/2017 11:00 AM Medical Record Number: [id number] Patient Account Number: [id number] Date of Birth/Sex: [birth year] (66 y.o. Male) Treating RN: Cornell Barman Primary Care Olevia Westervelt: Otilio Miu Other Clinician: Referring Lanaysia Fritchman: Otilio Miu Treating Carla Rashad/Extender: Tito Dine in Treatment: 68 Active Problems Location of Pain Severity and Description of Pain Patient Has Paino Yes Site Locations Pain Location: Generalized Pain Rate the pain. Current Pain Level: 4 Character of Pain Describe the Pain: Tender Pain Management and Medication Current Pain Management: Electronic Signature(s) Signed: 01/21/2017 2:47:47 PM By: Gretta Cool, BSN, RN, CWS, Kim RN, BSN Entered By: Gretta Cool, BSN, RN, CWS, Kim on 01/15/2017 10:34:19 Gilkes, [name] ([id number]) -------------------------------------------------------------------------------- Patient/Caregiver Education Details Patient Name: JONAN, SEUFERT C. Date of Service: 01/15/2017 11:00 AM Medical Record Number: [id number] Patient Account  Number: [id number] Date of Birth/Gender: 1950/05/24 (66 y.o. Male) Treating RN: Roger Shelter Primary Care Physician: Otilio Miu Other Clinician: Referring Physician: Otilio Miu Treating Physician/Extender: Tito Dine in Treatment: 55 Education Assessment Education Provided To: Caregiver wife Education Topics Provided Wound Debridement: Handouts: Wound Debridement Methods: Explain/Verbal Responses: State content correctly Wound/Skin Impairment:  Handouts: Caring for Your Ulcer Methods: Explain/Verbal Responses: State content correctly Electronic Signature(s) Signed: 01/15/2017 4:36:56 PM By: Roger Shelter Entered By: Roger Shelter on 01/15/2017 11:25:31 [place], [name] ([id number]) -------------------------------------------------------------------------------- Wound Assessment Details Patient Name: [name], [name] Date of Service: 01/15/2017 11:00 AM Medical Record Number: [id number] Patient Account Number: [id number] Date of Birth/Sex: [birth year] (66 y.o. Male) Treating RN: Cornell Barman Primary Care Derwood Becraft: Otilio Miu Other Clinician: Referring Erika Slaby: Otilio Miu Treating Delberta Folts/Extender: Ricard Dillon Weeks in Treatment: 51 Wound Status Wound Number: 4 Primary Pressure Ulcer Etiology: Wound Location: Left Gluteus Wound Open Wounding Event: Pressure Injury Status: Date Acquired: 07/09/2016 Comorbid Coronary Artery Disease, Type II Diabetes, Weeks Of Treatment: 22 History: Quadriplegia, Seizure Disorder Clustered Wound: No Photos Wound Measurements Length: (cm) 2.6 Width: (cm) 2.5 Depth: (cm) 0.6 Area: (cm) 5.105 Volume: (cm) 3.063 % Reduction in Area: -3151.6% % Reduction in Volume: -19043.7% Epithelialization: None Tunneling: No Undermining: No Wound Description Classification: Unstageable/Unclassified Wound Margin: Flat and Intact Exudate Amount: Medium Exudate Type: Serosanguineous Exudate Color: red,  brown Foul Odor After Cleansing: No Slough/Fibrino Yes Wound Bed Granulation Amount: None Present (0%) Exposed Structure Necrotic Amount: Large (67-100%) Fascia Exposed: No Necrotic Quality: Eschar, Adherent Slough Fat Layer (Subcutaneous Tissue) Exposed: Yes Tendon Exposed: No Muscle Exposed: No Joint Exposed: No Bone Exposed: No Periwound Skin Texture Texture Color No Abnormalities Noted: No No Abnormalities Noted: No Callus: No Atrophie Blanche: No Crepitus: No Cyanosis: No Perin, Cassian C. ([id number]) Excoriation: No Ecchymosis: Yes Induration: No Erythema: No Rash: No Hemosiderin Staining: No Scarring: No Mottled: No Pallor: No Moisture Rubor: No No Abnormalities Noted: No Dry / Scaly: No Maceration: No Wound Preparation Ulcer Cleansing: Rinsed/Irrigated with Saline Topical Anesthetic Applied: Other: lidocaine 4%, Treatment Notes Wound #4 (Left Gluteus) 1. Cleansed with: Clean wound with Normal Saline 2. Anesthetic Topical Lidocaine 4% cream to wound bed prior to debridement 4. Dressing Applied: Prisma Ag 5. Secondary Dressing Applied Bordered Foam Dressing Electronic Signature(s) Signed: 01/15/2017 11:56:42 AM By: Roger Shelter Signed: 01/21/2017 2:47:47 PM By: Gretta Cool, BSN, RN, CWS, Kim RN, BSN Entered By: Roger Shelter on 01/15/2017 11:56:41 [place], [name] ([id number]) -------------------------------------------------------------------------------- Wound Assessment Details Patient Name: [name], [name] Date of Service: 01/15/2017 11:00 AM Medical Record Number: [id number] Patient Account Number: [id number] Date of Birth/Sex: Dec 15, 1950 (66 y.o. Male) Treating RN: Cornell Barman Primary Care Averill Pons: Otilio Miu Other Clinician: Referring Aleene Swanner: Otilio Miu Treating Chiara Coltrin/Extender: Ricard Dillon Weeks in Treatment: 51 Wound Status Wound Number: 5 Primary Pressure Ulcer Etiology: Wound Location: Right Gluteus Wound  Open Wounding Event: Pressure Injury Status: Date Acquired: 09/10/2016 Comorbid Coronary Artery Disease, Type II Diabetes, Weeks Of Treatment: 17 History: Quadriplegia, Seizure Disorder Clustered Wound: No Photos Wound Measurements Length: (cm) 4.3 % Reduction Width: (cm) 4.7 % Reduction Depth: (cm) 1.4 Epitheliali Area: (cm) 15.873 Underminin Volume: (cm) 22.222 Startin Ending P Maximum in Area: -180.7% in Volume: -3833.1% zation: None g: Yes g Position (o'clock): 6 osition (o'clock): 12 Distance: (cm) 2.2 Wound Description Classification: Category/Stage IV Foul Odor A Wound Margin: Flat and Intact Due to Prod Exudate Amount: Large Slough/Fibr Exudate Type: Serous Exudate Color: amber fter Cleansing: Yes uct Use: No ino Yes Wound Bed Granulation Amount: Medium (34-66%) Exposed Structure Granulation Quality: Red Fascia Exposed: No Necrotic Amount: Medium (34-66%) Fat Layer (Subcutaneous Tissue) Exposed: Yes Necrotic Quality: Eschar, Adherent Slough Tendon Exposed: No Muscle Exposed: Yes Necrosis of Muscle: No Joint Exposed: No Bone Exposed: No Periwound Skin Texture [name], [name] ([id number]) Texture Color No  Abnormalities Noted: No No Abnormalities Noted: No Callus: No Atrophie Blanche: No Crepitus: No Cyanosis: No Excoriation: No Ecchymosis: No Induration: No Erythema: No Rash: No Hemosiderin Staining: No Scarring: No Mottled: No Pallor: No Moisture Rubor: No No Abnormalities Noted: No Dry / Scaly: No Temperature / Pain Maceration: No Temperature: No Abnormality Tenderness on Palpation: Yes Wound Preparation Ulcer Cleansing: Rinsed/Irrigated with Saline Topical Anesthetic Applied: Other: lidocaine 4%, Electronic Signature(s) Signed: 01/15/2017 11:57:08 AM By: Roger Shelter Signed: 01/21/2017 2:47:47 PM By: Gretta Cool, BSN, RN, CWS, Kim RN, BSN Entered By: Roger Shelter on 01/15/2017 11:57:08 Swartzendruber, [name]  ([id number]) -------------------------------------------------------------------------------- Wound Assessment Details Patient Name: [name], [name] Date of Service: 01/15/2017 11:00 AM Medical Record Number: [id number] Patient Account Number: [id number] Date of Birth/Sex: 09/03/1950 (67 y.o. Male) Treating RN: Cornell Barman Primary Care Jasean Ambrosia: Otilio Miu Other Clinician: Referring Latayna Ritchie: Otilio Miu Treating Fidel Caggiano/Extender: Ricard Dillon Weeks in Treatment: 51 Wound Status Wound Number: 6 Primary Pressure Ulcer Etiology: Wound Location: Sacrum - Medial Wound Open Wounding Event: Pressure Injury Status: Date Acquired: 10/16/2016 Comorbid Coronary Artery Disease, Type II Diabetes, Weeks Of Treatment: 12 History: Quadriplegia, Seizure Disorder Clustered Wound: No Photos Wound Measurements Length: (cm) 2.2 Width: (cm) 4.3 Depth: (cm) 1 Area: (cm) 7.43 Volume: (cm) 7.43 % Reduction in Area: 65% % Reduction in Volume: -250.3% Epithelialization: None Tunneling: No Undermining: No Wound Description Classification: Category/Stage III Wound Margin: Flat and Intact Exudate Amount: Large Exudate Type: Serosanguineous Exudate Color: red, brown Foul Odor After Cleansing: No Slough/Fibrino Yes Wound Bed Granulation Amount: None Present (0%) Exposed Structure Necrotic Amount: Large (67-100%) Fascia Exposed: No Necrotic Quality: Eschar, Adherent Slough Fat Layer (Subcutaneous Tissue) Exposed: Yes Tendon Exposed: No Muscle Exposed: No Joint Exposed: No Bone Exposed: No Periwound Skin Texture Texture Color No Abnormalities Noted: No No Abnormalities Noted: No Ecchymosis: Yes Moisture No Abnormalities Noted: No [name], [name] ([id number]) Wound Preparation Topical Anesthetic Applied: Other: lidocaine 4%, Treatment Notes Wound #6 (Medial Sacrum) 1. Cleansed with: Clean wound with Normal Saline 2. Anesthetic Topical Lidocaine 4% cream to wound bed  prior to debridement 4. Dressing Applied: Prisma Ag 5. Secondary Dressing Applied Bordered Foam Dressing Electronic Signature(s) Signed: 01/15/2017 11:57:30 AM By: Roger Shelter Signed: 01/21/2017 2:47:47 PM By: Gretta Cool, BSN, RN, CWS, Kim RN, BSN Entered By: Roger Shelter on 01/15/2017 11:57:29 Weis[place]el, [name] ([id number]) -------------------------------------------------------------------------------- Vitals Details Patient Name: CAMERON, SCHWINN C. Date of Service: 01/15/2017 11:00 AM Medical Record Number: [id number] Patient Account Number: [id number] Date of Birth/Sex: 1950-04-05 (66 y.o. Male) Treating RN: Cornell Barman Primary Care Chantille Navarrete: Otilio Miu Other Clinician: Referring Jake Goodson: Otilio Miu Treating Harvard Zeiss/Extender: Ricard Dillon Weeks in Treatment: 69 Vital Signs Time Taken: 10:37 Pulse (bpm): 88 Height (in): 70 Respiratory Rate (breaths/min): 16 Weight (lbs): 187 Blood Pressure (mmHg): 122/63 Body Mass Index (BMI): 26.8 Reference Range: 80 - 120 mg / dl Electronic Signature(s) Signed: 01/21/2017 2:47:47 PM By: Gretta Cool, BSN, RN, CWS, Kim RN, BSN Entered By: Gretta Cool, BSN, RN, CWS, Kim on 01/15/2017 10:37:37

## 2017-02-05 ENCOUNTER — Telehealth: Payer: Self-pay | Admitting: General Surgery

## 2017-02-05 NOTE — Telephone Encounter (Signed)
Spoke with patient's wife at this time. She stated Dr. Baltazar NajjarMichael Robson called her with the results of the MRI and instructed her to take patient to the emergency room so he could be treated with IV antibiotics sooner rather than later.  Patient has someone scraping  her driveway now to remove the snow,  and stated she would be sure to have patient at the emergency room no later than 1 pm 02/06/17.  I called Dr.fitzgerald and patient's appointment could not be [facility]

## 2017-02-05 NOTE — Telephone Encounter (Signed)
Patient's wife called and states that MRI shows infection in bone. Infectious disease office said that they are unable to get him in until 1/7. Please call patients wife - she is very concerned that this infection needs to be addressed sooner.

## 2017-02-06 ENCOUNTER — Emergency Department
Admission: EM | Admit: 2017-02-06 | Discharge: 2017-02-06 | Disposition: A | Discharge status: Home / Self Care | Payer: Medicare Other | Attending: Emergency Medicine | Admitting: Emergency Medicine

## 2017-02-06 ENCOUNTER — Ambulatory Visit: Payer: Self-pay | Admitting: General Surgery

## 2017-02-06 ENCOUNTER — Encounter: Payer: Medicare Other | Admitting: Internal Medicine

## 2017-02-06 ENCOUNTER — Emergency Department: Payer: Medicare Other

## 2017-02-06 ENCOUNTER — Telehealth: Payer: Self-pay | Admitting: Pharmacist

## 2017-02-06 ENCOUNTER — Other Ambulatory Visit: Payer: Self-pay

## 2017-02-06 DIAGNOSIS — Y828 Other medical devices associated with adverse incidents: Secondary | ICD-10-CM | POA: Insufficient documentation

## 2017-02-06 DIAGNOSIS — E119 Type 2 diabetes mellitus without complications: Secondary | ICD-10-CM | POA: Insufficient documentation

## 2017-02-06 DIAGNOSIS — Z7902 Long term (current) use of antithrombotics/antiplatelets: Secondary | ICD-10-CM | POA: Diagnosis not present

## 2017-02-06 DIAGNOSIS — F419 Anxiety disorder, unspecified: Secondary | ICD-10-CM | POA: Diagnosis not present

## 2017-02-06 DIAGNOSIS — Z79899 Other long term (current) drug therapy: Secondary | ICD-10-CM | POA: Diagnosis not present

## 2017-02-06 DIAGNOSIS — I2510 Atherosclerotic heart disease of native coronary artery without angina pectoris: Secondary | ICD-10-CM | POA: Diagnosis not present

## 2017-02-06 DIAGNOSIS — F4321 Adjustment disorder with depressed mood: Secondary | ICD-10-CM | POA: Diagnosis not present

## 2017-02-06 DIAGNOSIS — Z8673 Personal history of transient ischemic attack (TIA), and cerebral infarction without residual deficits: Secondary | ICD-10-CM | POA: Diagnosis not present

## 2017-02-06 DIAGNOSIS — L89159 Pressure ulcer of sacral region, unspecified stage: Secondary | ICD-10-CM | POA: Diagnosis not present

## 2017-02-06 DIAGNOSIS — Z452 Encounter for adjustment and management of vascular access device: Secondary | ICD-10-CM | POA: Diagnosis not present

## 2017-02-06 DIAGNOSIS — R21 Rash and other nonspecific skin eruption: Secondary | ICD-10-CM | POA: Diagnosis present

## 2017-02-06 DIAGNOSIS — M869 Osteomyelitis, unspecified: Secondary | ICD-10-CM

## 2017-02-06 DIAGNOSIS — T148XXA Other injury of unspecified body region, initial encounter: Secondary | ICD-10-CM

## 2017-02-06 DIAGNOSIS — L089 Local infection of the skin and subcutaneous tissue, unspecified: Secondary | ICD-10-CM | POA: Diagnosis not present

## 2017-02-06 DIAGNOSIS — Z7984 Long term (current) use of oral hypoglycemic drugs: Secondary | ICD-10-CM | POA: Diagnosis not present

## 2017-02-06 DIAGNOSIS — T8140XA Infection following a procedure, unspecified, initial encounter: Secondary | ICD-10-CM | POA: Diagnosis not present

## 2017-02-06 HISTORY — PX: IR FLUORO GUIDE CV MIDLINE PICC LEFT: IMG5211

## 2017-02-06 LAB — CBC WITH DIFFERENTIAL/PLATELET
Basophils Absolute: 0 10*3/uL (ref 0–0.1)
Basophils Relative: 0 %
EOS PCT: 2 %
Eosinophils Absolute: 0.3 10*3/uL (ref 0–0.7)
HEMATOCRIT: 30.1 % — AB (ref 40.0–52.0)
Hemoglobin: 9.8 g/dL — ABNORMAL LOW (ref 13.0–18.0)
LYMPHS PCT: 12 %
Lymphs Abs: 1.4 10*3/uL (ref 1.0–3.6)
MCH: 26.2 pg (ref 26.0–34.0)
MCHC: 32.4 g/dL (ref 32.0–36.0)
MCV: 80.8 fL (ref 80.0–100.0)
MONO ABS: 1 10*3/uL (ref 0.2–1.0)
MONOS PCT: 8 %
NEUTROS ABS: 9.4 10*3/uL — AB (ref 1.4–6.5)
Neutrophils Relative %: 78 %
PLATELETS: 421 10*3/uL (ref 150–440)
RBC: 3.72 MIL/uL — ABNORMAL LOW (ref 4.40–5.90)
RDW: 17.6 % — AB (ref 11.5–14.5)
WBC: 12.1 10*3/uL — ABNORMAL HIGH (ref 3.8–10.6)

## 2017-02-06 LAB — BASIC METABOLIC PANEL
Anion gap: 9 (ref 5–15)
BUN: 14 mg/dL (ref 6–20)
CALCIUM: 8.2 mg/dL — AB (ref 8.9–10.3)
CO2: 24 mmol/L (ref 22–32)
Chloride: 100 mmol/L — ABNORMAL LOW (ref 101–111)
Creatinine, Ser: 0.59 mg/dL — ABNORMAL LOW (ref 0.61–1.24)
GFR calc Af Amer: >60 mL/min (ref >60)
GLUCOSE: 149 mg/dL — AB (ref 65–99)
POTASSIUM: 3.9 mmol/L (ref 3.5–5.1)
Sodium: 133 mmol/L — ABNORMAL LOW (ref 135–145)

## 2017-02-06 LAB — C-REACTIVE PROTEIN: CRP: 13.2 mg/dL — ABNORMAL HIGH (ref <1.0)

## 2017-02-06 LAB — SEDIMENTATION RATE: Sed Rate: 116 mm/hr — ABNORMAL HIGH (ref 0–20)

## 2017-02-06 MED ORDER — CEFEPIME HCL 2 G IJ SOLR
2.0000 g | Freq: Once | INTRAMUSCULAR | Status: AC
  Administered 2017-02-06: 2 g via INTRAVENOUS
  Filled 2017-02-06: qty 2

## 2017-02-06 MED ORDER — FENTANYL CITRATE (PF) 100 MCG/2ML IJ SOLN
INTRAMUSCULAR | Status: AC
  Filled 2017-02-06: qty 2

## 2017-02-06 MED ORDER — VANCOMYCIN HCL 10 G IV SOLR: 918.0000 mg | Freq: Once | INTRAVENOUS | Status: DC

## 2017-02-06 MED ORDER — SODIUM CHLORIDE 0.9 % IV SOLN: 1000.0000 mg | Freq: Two times a day (BID) | INTRAVENOUS | 0 refills | Status: AC

## 2017-02-06 MED ORDER — DEXTROSE 5 % IV SOLN: 2.0000 g | Freq: Two times a day (BID) | INTRAVENOUS | 0 refills | Status: AC

## 2017-02-06 MED ORDER — VANCOMYCIN HCL IN DEXTROSE 1-5 GM/200ML-% IV SOLN
1000.0000 mg | Freq: Once | INTRAVENOUS | Status: AC
  Administered 2017-02-06: 1000 mg via INTRAVENOUS
  Filled 2017-02-06: qty 200

## 2017-02-06 NOTE — Discharge Instructions (Signed)
You were evaluated for recent diagnosis by MRI of bone infection called osteomyelitis and set up to get new PICC iv line for at home iv antibiotics.  Home health should be coming tomorrow to instruct on how to administer the iv antibiotics at home.  Return to the emergency department immediately for any worsening condition including fever, altered mental status, new or worsening drainage or redness or skin rash, or any other symptoms concerning to you.

## 2017-02-06 NOTE — ED Triage Notes (Signed)
Pt ER via POV with family. States that he has wound infections to sacrum and buttocks, MRI showed that the infection is in bone. No fever reported at home. Pt quadriplegic. Pt supposed to have PICC line placed but cannot be placed until January.

## 2017-02-06 NOTE — Procedures (Signed)
  Procedure: R PICC sl 36cm  Preprocedure diagnosis: Poor IV access Postprocedure diagnosis: same EBL:   minimal Complications:  none immediate  See full dictation in [employer] PACS.  Thora Lance. Edison Nicholson MD Main # [phone] Pager  [phone]

## 2017-02-06 NOTE — ED Notes (Signed)
Pt discharged to home.  Family member driving.  Discharge instructions reviewed.  Verbalized understanding.  No questions or concerns at this time.  Teach back verified.  Pt in NAD.  No items left in ED.   

## 2017-02-06 NOTE — ED Notes (Signed)
Patient transported to [facility] for PICC placement with interventional radiology.

## 2017-02-06 NOTE — ED Notes (Signed)
Wife having several concerns in regards to taking patient home and patient not receiving debridement and inpatient stay.  EDP notified and to go speak with patient's wife regarding this.

## 2017-02-06 NOTE — ED Provider Notes (Signed)
[facility] Emergency Department Provider Note ____________________________________________   I have reviewed the triage vital signs and the triage nursing note.  HISTORY  Chief Complaint Wound Infection   Historian Patient and [name]  HPI [name] is a 66 y.o. male with quadriplegia and history of sacral decubitus ulcers, presents for PICC line antibiotics after being diagnosed with osteomyelitis by MRI within the past week where he is followed by wound care at the wound center and also had a recent consultation with general surgery, Dr. Hampton Abbot for further management/debridement of the worsening sacral wounds.  He does have some discomfort where the wounds are.  [name] notes slight increased watery drainage over the past week or so, she is [name] primary caregiver.  No fevers.  They were attempting to make an appointment with infectious disease doctor, but not available until January.  They were instructed to come to the ER for PICC line placement.     Past Medical History:  Diagnosis Date  . Allergy   . 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 Anxiety   . CAD (coronary artery disease)   . Carotid stenosis 03/20/2016  . Diabetes mellitus without complication ([place])   . GERD (gastroesophageal reflux disease)   . Hemiplegia ([place]) 10/01/2012  . HLD (hyperlipidemia) 10/01/2012  . Hyperlipidemia   . Insomnia 02/23/2015  . Quadriplegia ([place])   . Seizure ([place]) 10/01/2012  . Seizures ([place])   . Spinal cord disease ([place])   . Stroke [facility])     Patient Active Problem List   Diagnosis Date Noted  . Decubitus ulcer 09/18/2016  . Confusion 06/09/2016  . Carotid stenosis 03/20/2016  . GERD (gastroesophageal reflux disease) 05/13/2015  . CAD (coronary artery disease) 05/13/2015  . Anxiety 05/13/2015  . Quadriplegia ([place]) 05/13/2015  . Urinary tract infection 05/13/2015  . Adjustment disorder with depressed mood 02/23/2015  . Insomnia 02/23/2015  .  Screening for diabetes mellitus 08/31/2014  . 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 Hemiplegia ([place]) 10/01/2012  . HLD (hyperlipidemia) 10/01/2012  . Convulsions ([place]) 10/01/2012  . Concussion and swelling of spinal cord 10/01/2012    Past Surgical History:  Procedure Laterality Date  . ENDARTERECTOMY    . ENDARTERECTOMY    . IR FLUORO GUIDE CV MIDLINE PICC LEFT  02/06/2017  . TRACHEOSTOMY      Prior to Admission medications   Medication Sig Start Date End Date Taking? Authorizing Provider  atorvastatin (LIPITOR) 10 MG tablet Take 1 tablet (10 mg total) by mouth daily. 12/18/16  Yes Juline Patch, MD  baclofen (LIORESAL) 20 MG tablet Take 2 tablets (40 mg total) by mouth at bedtime. Dr Cristobal Goldmann 12/18/16  Yes Juline Patch, MD  citalopram (CELEXA) 40 MG tablet Take 1 tablet (40 mg total) by mouth daily. 12/18/16  Yes Juline Patch, MD  clopidogrel (PLAVIX) 75 MG tablet Take 1 tablet (75 mg total) by mouth daily. 12/18/16  Yes Juline Patch, MD  diazepam (VALIUM) 5 MG tablet Take 5-10 mg by mouth 3 (three) times daily as needed for muscle spasms. Take 5 mg in the morning, 5 mg at noon and 10 mg every night at bedtime as needed for spasms. Dr Cristobal Goldmann 03/02/15  Yes [provider]  furosemide (LASIX) 20 MG tablet Take 1 tablet (20 mg total) by mouth daily. 05/15/16  Yes Juline Patch, MD  hydrocortisone 2.5 % ointment 1(ONE) APPLICATION(S) TOPICAL 2(TWO) TIMES A DAY 10/25/16  Yes [provider]  levETIRAcetam (KEPPRA) 750 MG tablet Take 750 mg by mouth 2 (two) times daily. Dr Melrose Nakayama   Yes [provider]  metFORMIN (GLUCOPHAGE) 500 MG tablet Take 1 tablet (500 mg total) by mouth 2 (two) times daily with a meal. 12/18/16  Yes Juline Patch, MD  mirtazapine (REMERON) 15 MG tablet Take 1 tablet (15 mg total) by mouth at bedtime. Dr. Cristobal Goldmann 12/18/16  Yes Juline Patch, MD  oxybutynin (DITROPAN) 5 MG tablet Take 1 tablet (5 mg total) by mouth 2 (two)  times daily. 12/18/16  Yes Juline Patch, MD  pantoprazole (PROTONIX) 40 MG tablet Take 1 tablet (40 mg total) by mouth daily. 12/18/16  Yes Otilio Miu C, MD  ceFEPIme 2 g in dextrose 5 % 50 mL Inject 2 g into the vein every 12 (twelve) hours. 02/06/17   Lisa Roca, MD  sodium hypochlorite (DAKIN'S 1/4 STRENGTH) 0.125 % SOLN Irrigate with 1 application as directed 2 times daily at 12 noon and 4 pm. 01/30/17   Piscoya, Jacqulyn Bath, MD  vancomycin 1,000 mg in sodium chloride 0.9 % 250 mL Inject 1,000 mg into the vein every 12 (twelve) hours. 02/06/17   Lisa Roca, MD    No Known Allergies  Family History  Problem Relation Age of Onset  . Stroke Father   . Parkinson's disease Father   . Heart disease Mother     Social History Social History   Tobacco Use  . Smoking status: Never Smoker  . Smokeless tobacco: Never Used  Substance Use Topics  . Alcohol use: No    Alcohol/week: 0.0 oz  . Drug use: No    Review of Systems  Constitutional: Negative for fever. Eyes: Negative for visual changes. ENT: Negative for sore throat. Cardiovascular: Negative for chest pain. Respiratory: Negative for shortness of breath. Gastrointestinal: Negative for abdominal pain, vomiting and diarrhea. Genitourinary: Negative for dysuria. Musculoskeletal: Negative for back pain. Skin: Decubitus sacral wounds Neurological: Negative for headache.  ____________________________________________   PHYSICAL EXAM:  VITAL SIGNS: ED Triage Vitals  Enc Vitals Group     BP 02/06/17 1101 132/70     Pulse Rate 02/06/17 1101 (!) 102     Resp 02/06/17 1101 (!) 22     Temp 02/06/17 1101 (!) 97.5 F (36.4 C)     Temp Source 02/06/17 1101 Oral     SpO2 02/06/17 1101 98 %     Weight 02/06/17 1102 135 lb (61.2 kg)     Height 02/06/17 1102 '5\' 8"'$  (1.727 m)     Head Circumference --      Peak Flow --      Pain Score 02/06/17 1101 5     Pain Loc --      Pain Edu? --      Excl. in [place]? --       Constitutional: Alert and oriented. Well appearing and in no distress. HEENT   Head: Normocephalic and atraumatic.      Eyes: Conjunctivae are normal. Pupils equal and round.       Ears:         Nose: No congestion/rhinnorhea.   Mouth/Throat: Mucous membranes are moist.   Neck: No stridor. Cardiovascular/Chest: Normal rate, regular rhythm.  No murmurs, rubs, or gallops. Respiratory: Normal respiratory effort without tachypnea nor retractions. Breath sounds are clear and equal bilaterally. No wheezes/rales/rhonchi. Gastrointestinal: Soft. No distention, no guarding, no rebound. Nontender.    Genitourinary/rectal:  Multiple deep sacral decubitus ulcers, clear drainage, deep  but clean edges Musculoskeletal: contractures of the extremities. Neurologic:  Normal speech and language.  Skin:  Sacral decubiti as per above Psychiatric: Mood and affect are normal. Speech and behavior are normal. Patient exhibits appropriate insight and judgment.   ____________________________________________  LABS (pertinent positives/negatives) I, Lisa Roca, MD the attending physician have reviewed the labs noted below.  Labs Reviewed  BASIC METABOLIC PANEL - Abnormal; Notable for the following components:      Result Value   Sodium 133 (*)    Chloride 100 (*)    Glucose, Bld 149 (*)    Creatinine, Ser 0.59 (*)    Calcium 8.2 (*)    All other components within normal limits  CBC WITH DIFFERENTIAL/PLATELET - Abnormal; Notable for the following components:   WBC 12.1 (*)    RBC 3.72 (*)    Hemoglobin 9.8 (*)    HCT 30.1 (*)    RDW 17.6 (*)    Neutro Abs 9.4 (*)    All other components within normal limits  SEDIMENTATION RATE - Abnormal; Notable for the following components:   Sed Rate 116 (*)    All other components within normal limits  C-REACTIVE PROTEIN    ____________________________________________    EKG I, Lisa Roca, MD, the attending physician have personally  viewed and interpreted all ECGs.  None ____________________________________________  RADIOLOGY All Xrays were viewed by me.  Imaging interpreted by Radiologist, and I, Lisa Roca, MD the attending physician have reviewed the radiologist interpretation noted below.  None __________________________________________  PROCEDURES  Procedure(s) performed: None  Critical Care performed: None   ____________________________________________  ED COURSE / ASSESSMENT AND PLAN  Pertinent labs & imaging results that were available during my care of the patient were reviewed by me and considered in my medical decision making (see chart for details).    Patient came in after recent MRI diagnosis of osteomyelitis associated with progressing sacral decubiti, currently followed by Dr. Dellia Nims with [facility] wound [facility], and most recently also seen about a week ago by general surgery who was following with bedside debridement.  Given the patient was unable to get into any sort of reasonable early infectious disease visit for initiation of PICC line with IV antibiotics, patient was referred to come to the ED to get this started.  Patient does not appear to be having symptoms of sepsis at present.  Patient was unable to have arm PICC due to contractures, and I spoke with interventional radiology who was able to do a chest tunneled PICC line.  I do not think patient needs inpatient management at this point time if I can set up for the patient to have home health tomorrow to instruct [name] who is competent and interested in helping give dosing at home.  Angela with care management was able to help set up with family to get home health to come tomorrow.  I spoke with Dr. Dellia Nims, wound center who will follow up with vanc labs and patient.  I spoke with Dr. Adonis Huguenin, general surgery - he was able to review Dr. Mont Dutton note from last week, there was no indication from the note that there is a high  chance of the patient needing an operative procedure, that bedside following a debridement was likely the plan moving forward.  From their standpoint and my clinical standpoint I do not think needs any hospitalization at this point time.  Dr. Dellia Nims did ask for adding on ESR/CRP for following.  Care manager was able to arrange antibiotic  prescriptions to be delivered to the house tomorrow, 2-week course, as well as home health to instruct on how to administer at home for the [name].  Patient and [name] are comfortable with the plan and will follow up with wound care physician as well as their surgeon.    CONSULTATIONS:   General surgeon by phone, Dr. Carman Ching. Interventional radiologist by phone - set up for tunneled picc.  Wound center physician Dr. Dellia Nims by phone - set up for follow up with patient.  Care management Levada Dy to help with setting up home health for home IV antibiotics.  Patient / Family / Caregiver informed of clinical course, medical decision-making process, and agree with plan.   I discussed return precautions, follow-up instructions, and discharge instructions with patient and/or family.  Discharge Instructions : You were evaluated for recent diagnosis by MRI of bone infection called osteomyelitis and set up to get new PICC iv line for at home iv antibiotics.  Home health should be coming tomorrow to instruct on how to administer the iv antibiotics at home.  Return to the emergency department immediately for any worsening condition including fever, altered mental status, new or worsening drainage or redness or skin rash, or any other symptoms concerning to you.    ___________________________________________   FINAL CLINICAL IMPRESSION(S) / ED DIAGNOSES   Final diagnoses:  Wound infection  Osteomyelitis, pelvis ([place])      ___________________________________________        Note: This dictation was prepared with Dragon dictation. Any transcriptional errors that  result from this process are unintentional    Lisa Roca, MD 02/06/17 [id number]

## 2017-02-06 NOTE — Care Management Note (Addendum)
Case Management Note  Patient Details  Name: [name] MRN: [id number] Date of Birth: 03-Nov-1950  Subjective/Objective:                  Met with patient and his wife Suanne Marker)  to discuss transition of care after I received call from Dr. Reita Cliche requesting home health and IV antibiotic arrangement.  Patient's wife feels comfortable administering home IV antibiotics as she indicated that she's done it before.  She is concerned about cost of these medications. I spoke with her about [facility] case management and she agrees to speak with liaison. Orders placed in Epic for home health. Patient/wife had no preference of home health agencies but states they have used [facility] in the past. PICC line in place. He rides in conversation [place] and will not need EMS.  Action/Plan: Referral to [facility] with [facility]. I have reached out to [facility] [phone] with [facility] to see if they can assist patient with long term planning and medical needs.   Expected Discharge Date:                  Expected Discharge Plan:     In-House Referral:     Discharge planning Services  CM Consult  Post Acute Care Choice:  Home Health Choice offered to:  Patient, Spouse  DME Arranged:  IV pump/equipment DME Agency:  [place]:  RN [facility] Agency:  [place]  Status of Service:  In process, will continue to follow  If discussed at Long Length of Stay Meetings, dates discussed:    Additional Comments:  Marshell Garfinkel, RN 02/06/2017, 3:15 PM

## 2017-02-06 NOTE — Progress Notes (Signed)
Assess both upper arms for PICC placement.  Has contractions to both upper arms and unable to move into position to access veins in either arm.  Recommend placement by intervention radiology for placement.

## 2017-02-06 NOTE — Consult Note (Addendum)
   [facility]   02/06/2017  Jaclyn ShaggyRichard C Preuss [birth year] [id number]    Referral received from inpatient case manager for [facility] Care Management services for post hospital ED discharge follow up related to high medication costs and 2 ed visits in the last 6 months.. Patient was evaluated for community based chronic disease management services with [facility] as a benefit of patient's [employer] Medicare. Inpatient case manager gave this liaison patient's spouse Rhonda's number to reach out to patient. Call placed to spouse  to explain [facility] services. Spouse very concerned about medication costs not only IV medications but also special cream ordered also.  Patient's spouse endorses his primary care provider to be Elizabeth Sauereanna Jones MD. Spouse  states patient will be receiving [facility] for nursing. Verbal Consent given. Spouse's number was given as the best contact number Rachael Darbyhonda Dysart [phone]. This liaison reached out to [facility] pharmacist to explain patient concern's over medication costs. [facility] plans to reach out to spouse. Patient will receive post hospital discharge calls and be evaluated for monthly home visits. [facility] services does not interfere with or replace any services arranged by the inpatient care management team.  Made inpatient RNCM aware that [facility] following for care management. For additional questions please contact:   Nyeema Want RN, BSN [facility]  Hospital Liaison  [phone]) Business Mobile [phone]) Toll free office

## 2017-02-06 NOTE — Care Management (Signed)
This RNCM explained to patient's wife Bjorn LoserRhonda that currently [facility] can provided 14 days of antibiotics at ~$35/day (~490$). She has also been notified that [facility] is working to see if they can provide it for any less. She mentioned that a [facility] coordinator just contacted her and because they couldn't give her a price apparently patient's wife hung up on her. I explained that she would need to talk with them to help with this arrangement- she said "yes ma'am".  I advised her to make sure he has follow up with Dr. Sampson GoonFitzgerald as he can work with her if another treatment regimen can be done for a cheaper cost- she said "yes ma'am".  I asked her if there was anything else I could assist her with and she said "no".  Barbara CowerJason with [facility] aware of patient discharge.

## 2017-02-07 ENCOUNTER — Other Ambulatory Visit: Payer: Self-pay | Admitting: Pharmacist

## 2017-02-07 DIAGNOSIS — E1169 Type 2 diabetes mellitus with other specified complication: Secondary | ICD-10-CM | POA: Diagnosis not present

## 2017-02-07 DIAGNOSIS — S14104A Unspecified injury at C4 level of cervical spinal cord, initial encounter: Secondary | ICD-10-CM | POA: Diagnosis not present

## 2017-02-07 DIAGNOSIS — G8250 Quadriplegia, unspecified: Secondary | ICD-10-CM | POA: Diagnosis not present

## 2017-02-07 DIAGNOSIS — M869 Osteomyelitis, unspecified: Secondary | ICD-10-CM | POA: Diagnosis not present

## 2017-02-07 NOTE — Patient Outreach (Signed)
[facility] Care Management  02/07/2017  Jaclyn ShaggyRichard C Ruvalcaba 24-Oct-1950 [id number]  Successful call to Mr. Shoults's spouse, Bjorn LoserRhonda.  HIPAA identifiers verified. I reviewed with Bjorn LoserRhonda the Extra Help / LIS application and potential savings with premiums, deductibles, and costs of prescriptions.  Bjorn LoserRhonda reports she does not have any medicare premiums and the costs of their medications is less than $50 / month.  I encouraged her to still apply in case a more expensive medication is initiated and that her co-pays could still be lowered with LIS.  I provided Bjorn LoserRhonda with the website to apply (https://www.white-williams.com/www.ssa.gov/benefits/medicare) as she stated she preferred to complete the application herself with her daughter.  She has no further questions or concerns at this time.  She does not wish to review Mr. Wofford's medications.    Plan: [facility] will close patient case at this time but am happy to assist in the future as needed.  I will alert [facility] RN of case closure.   Haynes Hoehnolleen Lloyd Cullinan, PharmD, [facility] Clinical Pharmacist [facility] [phone]

## 2017-02-07 NOTE — Patient Outreach (Signed)
[facility] Care Management  02/07/2017  Jaclyn ShaggyRichard C Sandora Jun 21, 1950 [id number]  66 year old male [facility] by inpatient case manager for transition of care related to high ED utilization and medication cost assistance for IV antibiotics.    PMHx includes, but not limited to, quadriplegia and history of sacral decubitus ulcers, CAD, carotid stenosis, stroke, seizures, diabetes mellitus, anxiety, and GERD.   Noted Mr. Georgetta HaberHatch was recently diagnosed with osteomyelitis associated with progressing sacral decubiti. He is currently in the ED for PICC placement for IV antibiotics.  Per notes, Mr. Georgetta HaberHatch has agreed to [facility] services and has given consent to speak with his wife, Bjorn LoserRhonda, regarding his needs.   Successful call placed to Mr. Gong's wife, Bjorn LoserRhonda. HIPAA identifiers verified. Bjorn LoserRhonda reports that Mr. Georgetta HaberHatch has been set up with [facility] who will deliver IV antibiotics (Vancomycin and Cefepime).  She is concerned about affordability of these medications.  She reports their joint income is $2000 /month.  Per inpatient case manager notes, daily co-pay amount for the IV antibiotics will be $35 / day.     Labs: 02/06/17:  SCr = 0.59 CRP = 13.2 Sed Rate = 116 WBC = 12.2  Medication assistance: I reviewed with Bjorn LoserRhonda that the IV antibiotics will be billed to Medicare Part B which usually charges the patient 20% of the total cost.  There is not a patient assistance program for Medicare Part B.    Patient preliminarily is eligible to apply for LIS / Extra Help which reduces costs and co-pays associated with Medicare Part D.  If approved, any savings from this program could allow patient to budget money to help with the Medicare Part B costs.   Rhonda voiced understanding.  She had to abruptly end the conversation to speak with ED staff.    Plan: I will follow-up with [facility] tomorrow regarding application for LIS / Extra Help.   Haynes Hoehnolleen Markail Diekman, PharmD,  [facility] Clinical Pharmacist [facility] [phone]

## 2017-02-12 DIAGNOSIS — L89150 Pressure ulcer of sacral region, unstageable: Secondary | ICD-10-CM | POA: Diagnosis not present

## 2017-02-12 DIAGNOSIS — L89220 Pressure ulcer of left hip, unstageable: Secondary | ICD-10-CM | POA: Diagnosis not present

## 2017-02-12 DIAGNOSIS — E1169 Type 2 diabetes mellitus with other specified complication: Secondary | ICD-10-CM | POA: Diagnosis not present

## 2017-02-12 DIAGNOSIS — K219 Gastro-esophageal reflux disease without esophagitis: Secondary | ICD-10-CM | POA: Diagnosis not present

## 2017-02-12 DIAGNOSIS — L89210 Pressure ulcer of right hip, unstageable: Secondary | ICD-10-CM | POA: Diagnosis not present

## 2017-02-12 DIAGNOSIS — E785 Hyperlipidemia, unspecified: Secondary | ICD-10-CM | POA: Diagnosis not present

## 2017-02-12 DIAGNOSIS — E11622 Type 2 diabetes mellitus with other skin ulcer: Secondary | ICD-10-CM | POA: Diagnosis not present

## 2017-02-12 DIAGNOSIS — Z8673 Personal history of transient ischemic attack (TIA), and cerebral infarction without residual deficits: Secondary | ICD-10-CM | POA: Diagnosis not present

## 2017-02-12 DIAGNOSIS — Z7902 Long term (current) use of antithrombotics/antiplatelets: Secondary | ICD-10-CM | POA: Diagnosis not present

## 2017-02-12 DIAGNOSIS — Z5181 Encounter for therapeutic drug level monitoring: Secondary | ICD-10-CM | POA: Diagnosis not present

## 2017-02-12 DIAGNOSIS — I2510 Atherosclerotic heart disease of native coronary artery without angina pectoris: Secondary | ICD-10-CM | POA: Diagnosis not present

## 2017-02-12 DIAGNOSIS — M4628 Osteomyelitis of vertebra, sacral and sacrococcygeal region: Secondary | ICD-10-CM | POA: Diagnosis not present

## 2017-02-12 DIAGNOSIS — S14103S Unspecified injury at C3 level of cervical spinal cord, sequela: Secondary | ICD-10-CM | POA: Diagnosis not present

## 2017-02-12 DIAGNOSIS — L89212 Pressure ulcer of right hip, stage 2: Secondary | ICD-10-CM | POA: Diagnosis not present

## 2017-02-12 DIAGNOSIS — M869 Osteomyelitis, unspecified: Secondary | ICD-10-CM | POA: Diagnosis not present

## 2017-02-12 DIAGNOSIS — Z452 Encounter for adjustment and management of vascular access device: Secondary | ICD-10-CM | POA: Diagnosis not present

## 2017-02-12 DIAGNOSIS — G8252 Quadriplegia, C1-C4 incomplete: Secondary | ICD-10-CM | POA: Diagnosis not present

## 2017-02-13 ENCOUNTER — Encounter: Payer: Medicare Other | Admitting: Internal Medicine

## 2017-02-14 ENCOUNTER — Ambulatory Visit: Payer: Medicare Other | Admitting: Surgery

## 2017-02-15 DIAGNOSIS — M869 Osteomyelitis, unspecified: Secondary | ICD-10-CM | POA: Diagnosis not present

## 2017-02-15 DIAGNOSIS — S14104A Unspecified injury at C4 level of cervical spinal cord, initial encounter: Secondary | ICD-10-CM | POA: Diagnosis not present

## 2017-02-15 DIAGNOSIS — G8250 Quadriplegia, unspecified: Secondary | ICD-10-CM | POA: Diagnosis not present

## 2017-02-17 DIAGNOSIS — G8250 Quadriplegia, unspecified: Secondary | ICD-10-CM | POA: Diagnosis not present

## 2017-02-17 DIAGNOSIS — L89323 Pressure ulcer of left buttock, stage 3: Secondary | ICD-10-CM | POA: Diagnosis not present

## 2017-02-20 ENCOUNTER — Other Ambulatory Visit: Payer: Self-pay | Admitting: *Deleted

## 2017-02-20 ENCOUNTER — Other Ambulatory Visit: Payer: Self-pay

## 2017-02-20 DIAGNOSIS — S14104A Unspecified injury at C4 level of cervical spinal cord, initial encounter: Secondary | ICD-10-CM | POA: Diagnosis not present

## 2017-02-20 DIAGNOSIS — E11622 Type 2 diabetes mellitus with other skin ulcer: Secondary | ICD-10-CM | POA: Diagnosis not present

## 2017-02-20 DIAGNOSIS — E1169 Type 2 diabetes mellitus with other specified complication: Secondary | ICD-10-CM | POA: Diagnosis not present

## 2017-02-20 DIAGNOSIS — R630 Anorexia: Secondary | ICD-10-CM

## 2017-02-20 DIAGNOSIS — G8250 Quadriplegia, unspecified: Secondary | ICD-10-CM | POA: Diagnosis not present

## 2017-02-20 DIAGNOSIS — M869 Osteomyelitis, unspecified: Secondary | ICD-10-CM | POA: Diagnosis not present

## 2017-02-20 DIAGNOSIS — N39498 Other specified urinary incontinence: Secondary | ICD-10-CM

## 2017-02-20 DIAGNOSIS — K5904 Chronic idiopathic constipation: Secondary | ICD-10-CM

## 2017-02-20 MED ORDER — LACTULOSE 10 GM/15ML PO SOLN: 10.0000 g | Freq: Three times a day (TID) | ORAL | 0 refills | Status: AC

## 2017-02-20 MED ORDER — LACTULOSE 10 G PO PACK: 10.0000 g | PACK | Freq: Three times a day (TID) | ORAL | 0 refills | Status: DC

## 2017-02-20 NOTE — Patient Outreach (Signed)
Successful telephone encounter to [name] (spouse, preferred contact) for [name], 66 year old male, follow up on referral [name] lia[facility] 02/06/17  for transition of care/high ED utilization, recent ED visit 12/12 for wound infection sacral wounds.   Pt's history includes but not limited to Quadriplegia, CAD, GERD, Hyperlipidemia, Hemiplegia, decubitus ulcer.   Spoke with spouse [name](preferred contact), HIPAA identifiers provided on pt, discussed purpose of call for follow up on referral for transition of care- pt's recent ED visit.  Spouse reports she is doing pt's IV antibiotics twice a day, HH nurse comes once a week.   Spouse reports wound looks great, 75 % better, wound pink all over, took 3 weeks, what a difference between Pt taking an oral antibiotic and IV antibiotics.  Spouse reports she does all of pt's care, requires 24/7.  Spouse reports pt taking all his medications as ordered/she administers.  Patient was recently discharged from hospital and all medications have been reviewed.  RN CM discussed [facility] transition of care program- weekly calls, a home visit to which spouse declined having a home visit with pt, already has a one nurse coming, does not want any germs coming in the house.    Spouse did agree to telephone follow up call.   Plan:  As discussed with spouse, plan to follow up again next week telephonically, check on pt's clinical status.   [name] M.   [name] Utke RN CCM [facility] Care [facility]  [phone]

## 2017-02-21 DIAGNOSIS — M869 Osteomyelitis, unspecified: Secondary | ICD-10-CM | POA: Diagnosis not present

## 2017-02-21 DIAGNOSIS — S14104A Unspecified injury at C4 level of cervical spinal cord, initial encounter: Secondary | ICD-10-CM | POA: Diagnosis not present

## 2017-02-21 DIAGNOSIS — E1169 Type 2 diabetes mellitus with other specified complication: Secondary | ICD-10-CM | POA: Diagnosis not present

## 2017-02-21 DIAGNOSIS — G8250 Quadriplegia, unspecified: Secondary | ICD-10-CM | POA: Diagnosis not present

## 2017-02-21 DIAGNOSIS — E11622 Type 2 diabetes mellitus with other skin ulcer: Secondary | ICD-10-CM | POA: Diagnosis not present

## 2017-02-22 ENCOUNTER — Telehealth: Payer: Self-pay | Admitting: Nurse Practitioner

## 2017-02-22 NOTE — Telephone Encounter (Signed)
[facility]: Terri, RN called for orders.  - RN had completed initial visit for admission to Hospice.  Pt had no valium at home, which was helping control his muscle spams. Pt w/ active spasms during visit. - Pt w/o pain medication as well and RN assessment indicates pt is possibly beginning the transition toward active dying stage. - Requested lorazepam 2 mg q6h prn and morphine 5mg  q4h prn for symptom relief.    Provided verbal order that it would be ok to prescribe palliation as requested with lorazepam and morphine.  Also can refill valium as previously prescribed. Terri, RN stated she would contact the hospice NP to write prescriptions and get them delivered to pt's home.

## 2017-02-25 ENCOUNTER — Telehealth: Payer: Self-pay

## 2017-02-25 NOTE — Telephone Encounter (Signed)
Discussed  With triage that we are deferring ALL narcotic needs and treatment of any narcotics to [facility] at [facility] per Elizabeth Sauereanna Jones.

## 2017-02-27 ENCOUNTER — Encounter: Payer: Self-pay | Admitting: *Deleted

## 2017-02-27 ENCOUNTER — Other Ambulatory Visit: Payer: Self-pay | Admitting: *Deleted

## 2017-02-27 NOTE — Patient Outreach (Signed)
Successful telephone encounter to spouse Bjorn LoserRhonda (preferred contact) for  Fernande Brasichard Ladd, 67 year old male for transition of care/high ED utilization,most recent ED visit 12/12 for wound infection sacral wounds.  Pt's history includes but not limited to Quadriplegia, CAD, GERD, Hyperlipidemia, Hemiplegia, decubitus ulcer.   Spoke with spouse Bjorn LoserRhonda, HIPAA identifiers verified.on pt.  Prior to phone call with spouse, view in  EMR - 02/22/17 PCP NP's note pt admitted to Hospice to which RN CM confirmed with spouse today.   RN CM discussed with spouse plan to close case due to pt receiving Hospice services.   Plan:  [facility]- send case closure letter.           Inform [facility] CMA to close case.    Shayne Alkenose M.   Izella Ybanez RN CCM [facility]  [phone]

## 2017-03-04 ENCOUNTER — Ambulatory Visit: Payer: Medicare Other | Admitting: Surgery

## 2017-03-29 DEATH — deceased

## 2018-11-02 IMAGING — CT CT HEAD W/O CM
3 series · 15 of 47 positions shown, 18 images · non-contrast
Comparison: 05/12/2015

CLINICAL DATA: Altered mental status, confusion

EXAM:
CT HEAD WITHOUT CONTRAST
TECHNIQUE: Contiguous axial images were obtained from the base of the skull
through the vertex without intravenous contrast.

[Series 2: head wo · axial · 0.47mm/px · z∈[-140,-15]mm · 9 of 31 slices shown, 12 images]
[im 3/31  brain]
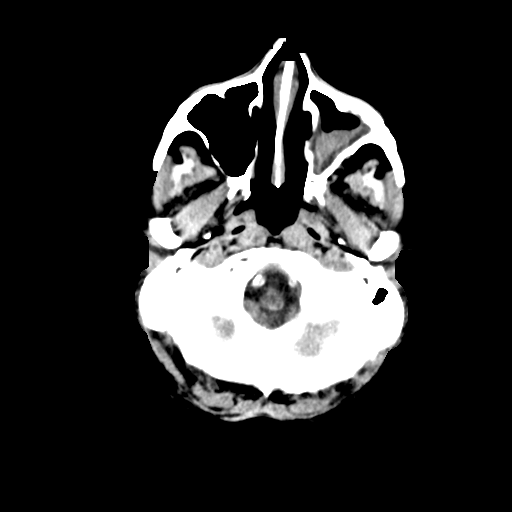
[im 3/31  bone]
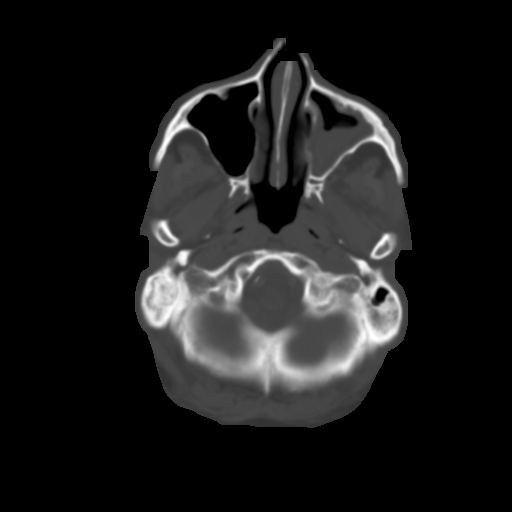
[im 6/31  brain]
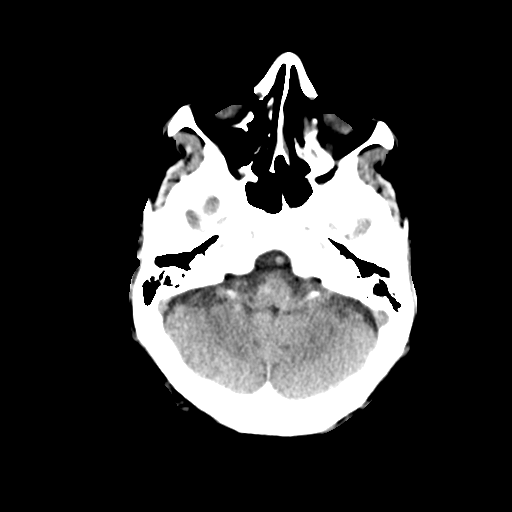
[im 9/31  brain]
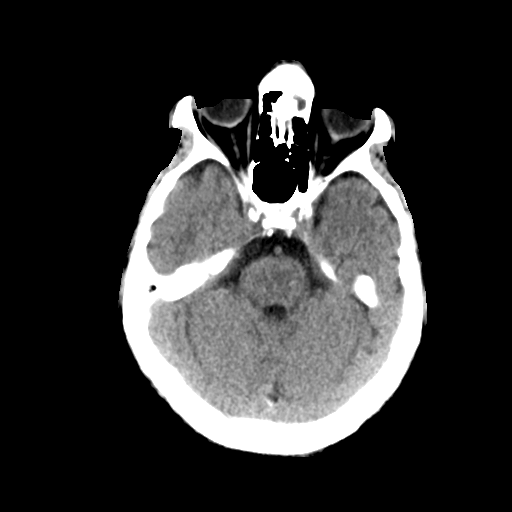
[im 12/31  brain]
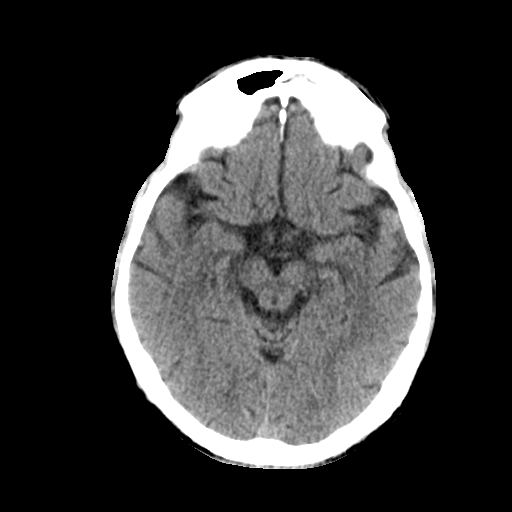
[im 16/31  brain]
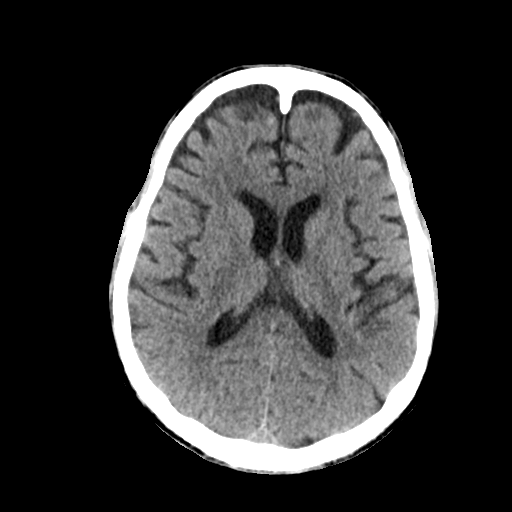
[im 16/31  bone]
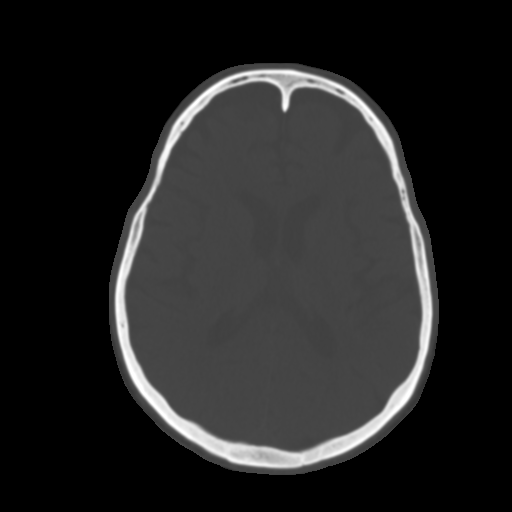
[im 19/31  brain]
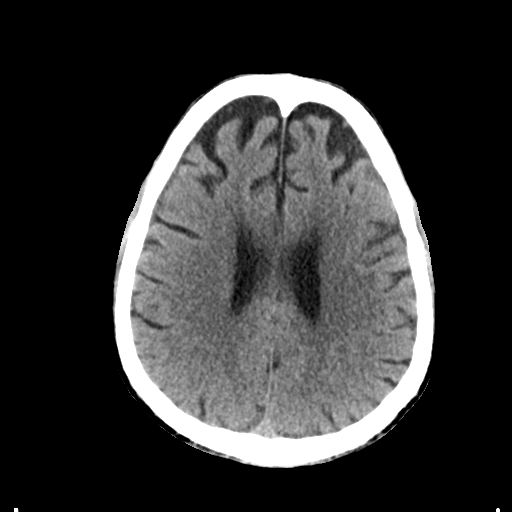
[im 22/31  brain]
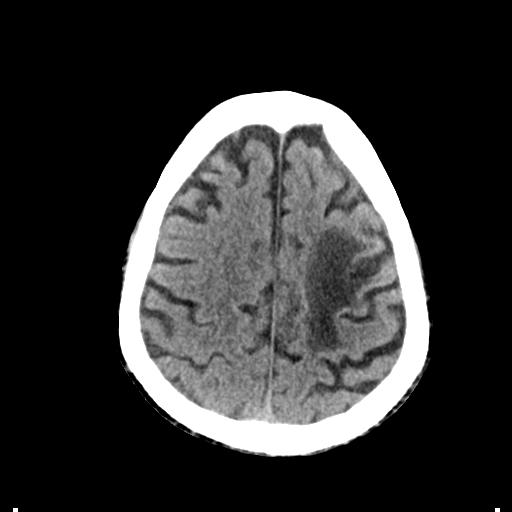
[im 25/31  brain]
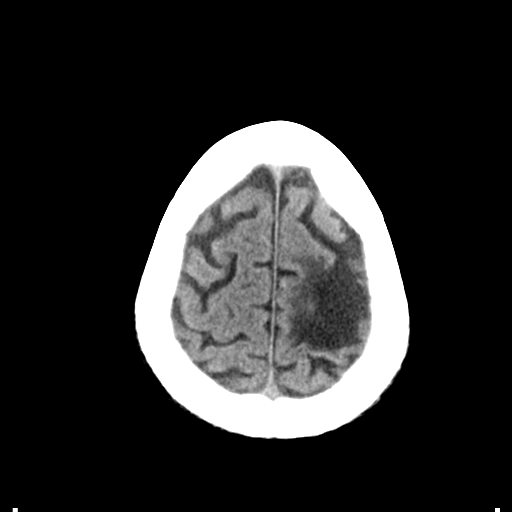
[im 28/31  brain]
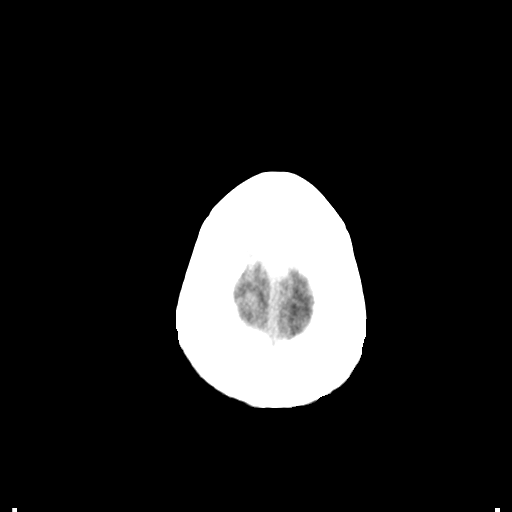
[im 28/31  bone]
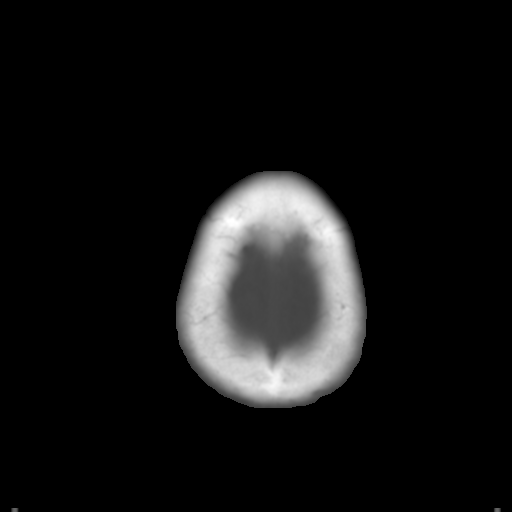

[Series 4: coronal soft tissue · coronal · 0.29mm/px · 3 of 67 slices shown]
[im 23/67  brain]
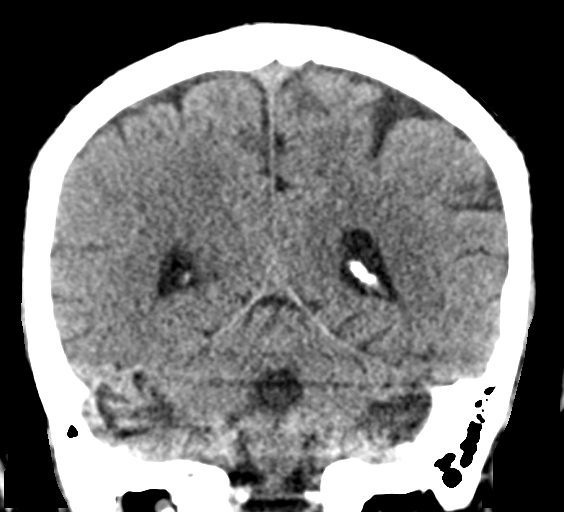
[im 30/67  brain]
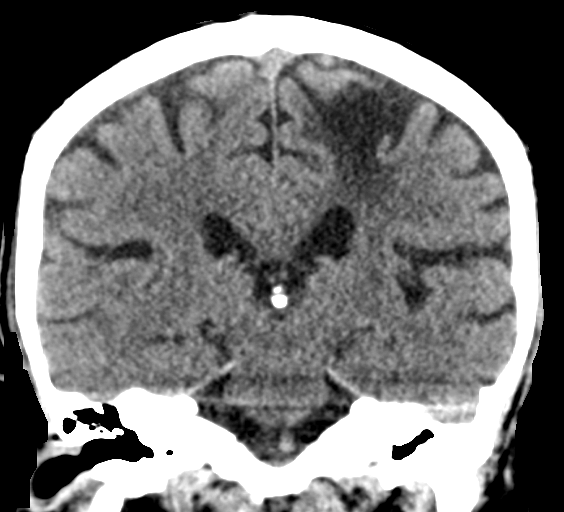
[im 37/67  brain]
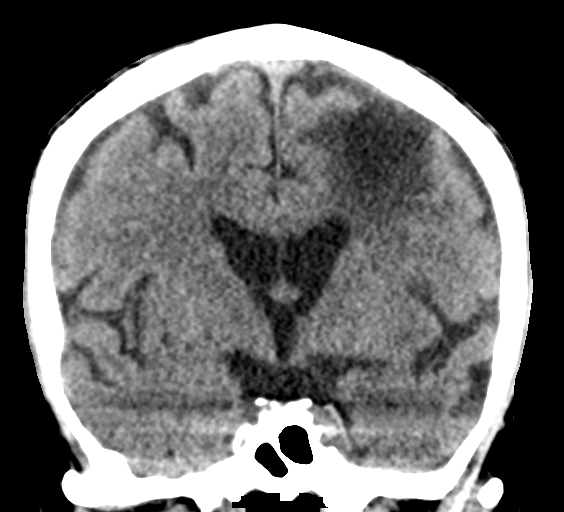

[Series 5: sagittal soft tissue · sagittal · 0.28mm/px · 3 of 56 slices shown]
[im 19/56  brain]
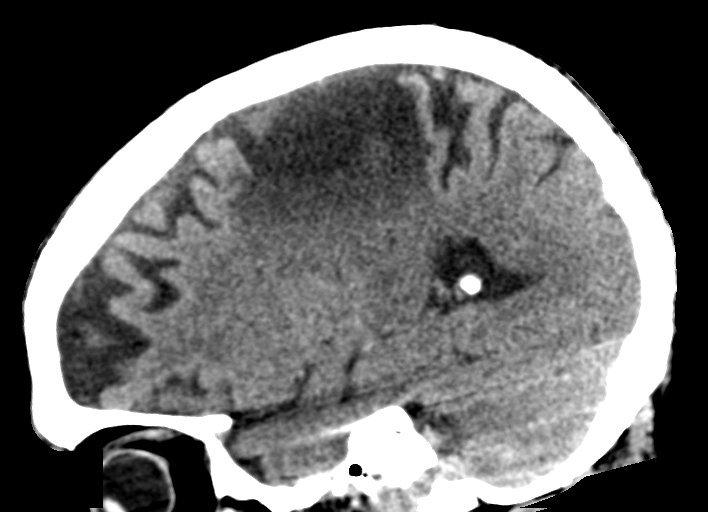
[im 28/56  brain]
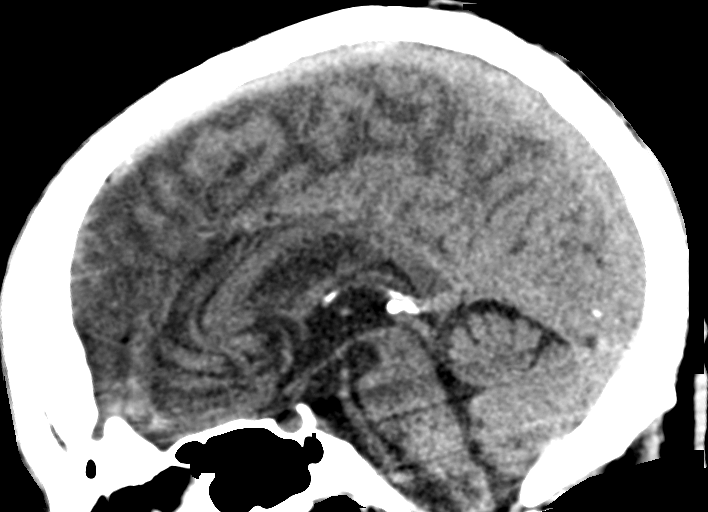
[im 37/56  brain]
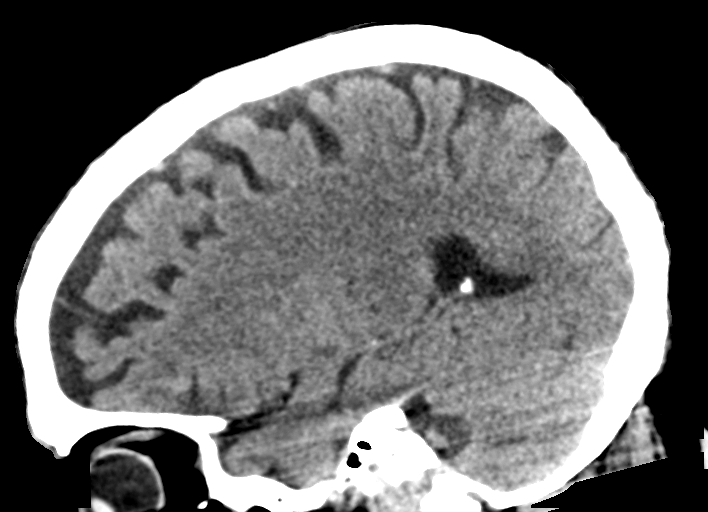

[15 of 47 positions shown; findings below may reference images not displayed]

FINDINGS: Brain: No intracranial hemorrhage, mass effect or midline shift. No
acute cortical infarction. Stable remote infarct left MCA territory.
No mass lesion is noted on this unenhanced scan. Mild cerebral
atrophy is stable. Stable mild periventricular chronic white matter
disease.

Vascular: Atherosclerotic calcifications of carotid siphon

Skull: No skull fracture is noted.

Sinuses/Orbits: There is significant mucosal thickening with almost
complete opacification left maxillary sinus. There is mucosal
thickening with complete opacification of left frontal sinus. The
mastoid air cells are unremarkable.

Other: None
IMPRESSION: No acute intracranial abnormality. Stable atrophy and chronic white
matter disease. Stable old infarct left MCA territory. Mucosal
thickening with almost complete opacification left maxillary sinus.
Mucosal thickening with complete opacification left frontal sinus.
